# Patient Record
Sex: Female | Born: 1955
Health system: Southern US, Community
[De-identification: ages and names within clinical notes are randomized; demographics above are authoritative.]

## PROBLEM LIST (undated history)

## (undated) DIAGNOSIS — F419 Anxiety disorder, unspecified: Secondary | ICD-10-CM

## (undated) DIAGNOSIS — E785 Hyperlipidemia, unspecified: Secondary | ICD-10-CM

## (undated) DIAGNOSIS — K219 Gastro-esophageal reflux disease without esophagitis: Secondary | ICD-10-CM

## (undated) DIAGNOSIS — M47816 Spondylosis without myelopathy or radiculopathy, lumbar region: Secondary | ICD-10-CM

## (undated) DIAGNOSIS — H269 Unspecified cataract: Secondary | ICD-10-CM

## (undated) DIAGNOSIS — I2510 Atherosclerotic heart disease of native coronary artery without angina pectoris: Secondary | ICD-10-CM

## (undated) DIAGNOSIS — F329 Major depressive disorder, single episode, unspecified: Secondary | ICD-10-CM

## (undated) DIAGNOSIS — F32A Depression, unspecified: Secondary | ICD-10-CM

## (undated) DIAGNOSIS — I10 Essential (primary) hypertension: Secondary | ICD-10-CM

## (undated) DIAGNOSIS — M549 Dorsalgia, unspecified: Secondary | ICD-10-CM

## (undated) HISTORY — PX: MULTIPLE TOOTH EXTRACTIONS: SHX2053

## (undated) HISTORY — PX: BACK SURGERY: SHX140

## (undated) HISTORY — DX: Unspecified cataract: H26.9

## (undated) HISTORY — PX: COLONOSCOPY: SHX174

## (undated) HISTORY — DX: Depression, unspecified: F32.A

## (undated) HISTORY — DX: Spondylosis without myelopathy or radiculopathy, lumbar region: M47.816

## (undated) HISTORY — DX: Gastro-esophageal reflux disease without esophagitis: K21.9

## (undated) HISTORY — DX: Anxiety disorder, unspecified: F41.9

## (undated) HISTORY — DX: Hyperlipidemia, unspecified: E78.5

## (undated) HISTORY — DX: Major depressive disorder, single episode, unspecified: F32.9

---

## 1988-06-30 HISTORY — PX: SPINE SURGERY: SHX786

## 2012-12-09 ENCOUNTER — Emergency Department (HOSPITAL_COMMUNITY)
Admission: EM | Admit: 2012-12-09 | Discharge: 2012-12-10 | Disposition: A | Discharge status: Home / Self Care | Payer: Self-pay | Attending: Emergency Medicine | Admitting: Emergency Medicine

## 2012-12-09 ENCOUNTER — Encounter (HOSPITAL_COMMUNITY): Payer: Self-pay | Admitting: *Deleted

## 2012-12-09 DIAGNOSIS — Z9889 Other specified postprocedural states: Secondary | ICD-10-CM | POA: Insufficient documentation

## 2012-12-09 DIAGNOSIS — M5380 Other specified dorsopathies, site unspecified: Secondary | ICD-10-CM | POA: Insufficient documentation

## 2012-12-09 DIAGNOSIS — M62830 Muscle spasm of back: Secondary | ICD-10-CM

## 2012-12-09 DIAGNOSIS — M545 Low back pain, unspecified: Secondary | ICD-10-CM | POA: Insufficient documentation

## 2012-12-09 DIAGNOSIS — I10 Essential (primary) hypertension: Secondary | ICD-10-CM | POA: Insufficient documentation

## 2012-12-09 DIAGNOSIS — Z87891 Personal history of nicotine dependence: Secondary | ICD-10-CM | POA: Insufficient documentation

## 2012-12-09 HISTORY — DX: Essential (primary) hypertension: I10

## 2012-12-09 HISTORY — DX: Dorsalgia, unspecified: M54.9

## 2012-12-09 NOTE — ED Notes (Signed)
Pt with lower back pain x 1 month but worse over last few days, old injury

## 2012-12-10 MED ORDER — HYDROMORPHONE HCL PF 1 MG/ML IJ SOLN
1.0000 mg | Freq: Once | INTRAMUSCULAR | Status: AC
  Administered 2012-12-10: 1 mg via INTRAMUSCULAR
  Filled 2012-12-10: qty 1

## 2012-12-10 MED ORDER — HYDROCODONE-ACETAMINOPHEN 5-325 MG PO TABS: 1.0000 | ORAL_TABLET | ORAL | Status: DC | PRN

## 2012-12-10 MED ORDER — DIAZEPAM 5 MG PO TABS: 5.0000 mg | ORAL_TABLET | Freq: Two times a day (BID) | ORAL | Status: DC

## 2012-12-10 MED ORDER — DIAZEPAM 5 MG/ML IJ SOLN
10.0000 mg | Freq: Once | INTRAMUSCULAR | Status: AC
  Administered 2012-12-10: 10 mg via INTRAMUSCULAR
  Filled 2012-12-10: qty 2

## 2012-12-10 NOTE — ED Notes (Signed)
Patient states no relief from Valium; patient given Dilaudid 1mg  IM.

## 2012-12-10 NOTE — ED Provider Notes (Signed)
History     CSN: [id number]  Arrival date & time 12/09/12  2305   First MD Initiated Contact with Patient 12/09/12 2343      Chief Complaint  Patient presents with  . Back Pain    (Consider location/radiation/quality/duration/timing/severity/associated sxs/prior treatment) HPI Comments: Donna Woodward is a 57 y.o. Female with a chronic intermittent problem with low back pain and muscle spasm which has been more frequent over the past month.   Patient denies any new injury specifically. She describes right lower back muscle spasm which is worsened with movement. There is no radiation of her pain.  There has been no weakness or numbness in the lower extremities and no urinary or bowel retention or incontinence.  Patient does not have a history of cancer or IVDU.  She generally takes flexeril which she was prescribed when living up [facility] for the muscle spasm but has not helped for this flare.        The history is provided by the patient.    Past Medical History  Diagnosis Date  . Back pain   . Hypertension     Past Surgical History  Procedure Laterality Date  . Back surgery      History reviewed. No pertinent family history.  History  Substance Use Topics  . Smoking status: Former Games developer  . Smokeless tobacco: Not on file  . Alcohol Use: No    OB History   Grav Para Term Preterm Abortions TAB SAB Ect Mult Living                  Review of Systems  Constitutional: Negative for fever and chills.  Respiratory: Negative for shortness of breath.   Cardiovascular: Negative for chest pain and leg swelling.  Gastrointestinal: Negative for abdominal pain, constipation and abdominal distention.  Genitourinary: Negative for dysuria, urgency, frequency, flank pain and difficulty urinating.  Musculoskeletal: Positive for back pain. Negative for joint swelling and gait problem.  Skin: Negative for rash.  Neurological: Negative for weakness and numbness.    Allergies    Review of patient's allergies indicates no known allergies.  Home Medications   Current Outpatient Rx  Name  Route  Sig  Dispense  Refill  . diazepam (VALIUM) 5 MG tablet   Oral   Take 1 tablet (5 mg total) by mouth 2 (two) times daily.   15 tablet   0   . HYDROcodone-acetaminophen (NORCO/VICODIN) 5-325 MG per tablet   Oral   Take 1 tablet by mouth every 4 (four) hours as needed for pain.   15 tablet   0     BP 133/74  Pulse 60  Temp(Src) 97.8 F (36.6 C) (Oral)  Resp 18  Ht 5\' 5"  (1.651 m)  Wt 180 lb (81.647 kg)  BMI 29.95 kg/m2  SpO2 95%  Physical Exam  Nursing note and vitals reviewed. Constitutional: She appears well-developed and well-nourished.  HENT:  Head: Normocephalic.  Eyes: Conjunctivae are normal.  Neck: Normal range of motion. Neck supple.  Cardiovascular: Normal rate and intact distal pulses.   Pedal pulses normal.  Pulmonary/Chest: Effort normal.  Abdominal: Soft. Bowel sounds are normal. She exhibits no distension and no mass.  Musculoskeletal: Normal range of motion. She exhibits no edema.       Lumbar back: She exhibits tenderness and spasm. She exhibits no swelling and no edema.       Back:  Neurological: She is alert. She has normal strength. She displays no atrophy and no  tremor. No sensory deficit. Gait normal.  Reflex Scores:      Patellar reflexes are 2+ on the right side and 2+ on the left side.      Achilles reflexes are 2+ on the right side and 2+ on the left side. No strength deficit noted in hip and knee flexor and extensor muscle groups.  Ankle flexion and extension intact.  Skin: Skin is warm and dry.  Psychiatric: She has a normal mood and affect.    ED Course  Procedures (including critical care time)  Labs Reviewed - No data to display No results found.   1. Low back pain   2. Muscle spasm of back     Medications  diazepam (VALIUM) injection 10 mg (10 mg Intramuscular Given 12/10/12 0018)  HYDROmorphone (DILAUDID)  injection 1 mg (1 mg Intramuscular Given 12/10/12 0117)     MDM  Pt obtained moderate improvement in pain and muscle spasm with ed tx.  She was prescribed valium to use in place of her flexeril, hydrocodone,  Encouraged heat tx,  Continued ibuprofen,  Referrals given for pcp.  No neuro deficit on exam or by history to suggest emergent or surgical presentation.  Also discussed worsened sx that should prompt immediate re-evaluation including distal weakness, bowel/bladder retention/incontinence.               Burgess Amor, PA-C 12/10/12 0206

## 2012-12-10 NOTE — ED Provider Notes (Signed)
Medical screening examination/treatment/procedure(s) were performed by non-physician practitioner and as supervising physician I was immediately available for consultation/collaboration.  Nicoletta Dress. Colon Branch, MD 12/10/12 [phone]

## 2012-12-10 NOTE — ED Notes (Signed)
Discharge instructions given and reviewed with patient.  Prescriptions given for Vicodin and Valium; effects and use explained.  Patient verbalized understanding to take Valium in place of Flexeril and to follow up with PMD if no improvement.  Patient discharged home in good condition via wheelchair.  Friend accompanied discharge to drive home.

## 2013-02-17 ENCOUNTER — Ambulatory Visit (HOSPITAL_COMMUNITY)
Admission: RE | Admit: 2013-02-17 | Discharge: 2013-02-17 | Disposition: A | Discharge status: Home / Self Care | Payer: Self-pay | Source: Ambulatory Visit | Attending: Physical Medicine and Rehabilitation | Admitting: Physical Medicine and Rehabilitation

## 2013-02-17 ENCOUNTER — Other Ambulatory Visit (HOSPITAL_COMMUNITY): Payer: Self-pay | Admitting: *Deleted

## 2013-02-17 DIAGNOSIS — M545 Low back pain, unspecified: Secondary | ICD-10-CM | POA: Insufficient documentation

## 2013-02-17 DIAGNOSIS — S3992XA Unspecified injury of lower back, initial encounter: Secondary | ICD-10-CM

## 2013-04-26 ENCOUNTER — Other Ambulatory Visit (HOSPITAL_COMMUNITY): Payer: Self-pay | Admitting: *Deleted

## 2013-04-26 DIAGNOSIS — Z139 Encounter for screening, unspecified: Secondary | ICD-10-CM

## 2013-05-06 ENCOUNTER — Ambulatory Visit (HOSPITAL_COMMUNITY)
Admission: RE | Admit: 2013-05-06 | Discharge: 2013-05-06 | Disposition: A | Discharge status: Home / Self Care | Payer: PRIVATE HEALTH INSURANCE | Source: Ambulatory Visit | Attending: *Deleted | Admitting: *Deleted

## 2013-05-06 DIAGNOSIS — Z139 Encounter for screening, unspecified: Secondary | ICD-10-CM

## 2013-05-06 DIAGNOSIS — Z1231 Encounter for screening mammogram for malignant neoplasm of breast: Secondary | ICD-10-CM | POA: Insufficient documentation

## 2015-01-16 ENCOUNTER — Other Ambulatory Visit (HOSPITAL_COMMUNITY): Payer: Self-pay | Admitting: Physician Assistant

## 2015-01-16 DIAGNOSIS — Z1231 Encounter for screening mammogram for malignant neoplasm of breast: Secondary | ICD-10-CM

## 2015-01-25 ENCOUNTER — Ambulatory Visit (HOSPITAL_COMMUNITY)
Admission: RE | Admit: 2015-01-25 | Discharge: 2015-01-25 | Disposition: A | Discharge status: Home / Self Care | Payer: Self-pay | Source: Ambulatory Visit | Attending: Physician Assistant | Admitting: Physician Assistant

## 2015-01-25 DIAGNOSIS — Z1231 Encounter for screening mammogram for malignant neoplasm of breast: Secondary | ICD-10-CM | POA: Insufficient documentation

## 2015-04-05 ENCOUNTER — Other Ambulatory Visit: Payer: Self-pay | Admitting: Physician Assistant

## 2015-04-05 LAB — COMPREHENSIVE METABOLIC PANEL
ALBUMIN: 4 g/dL (ref 3.6–5.1)
ALT: 20 U/L (ref 6–29)
AST: 17 U/L (ref 10–35)
Alkaline Phosphatase: 60 U/L (ref 33–130)
BUN: 15 mg/dL (ref 7–25)
CHLORIDE: 104 mmol/L (ref 98–110)
CO2: 28 mmol/L (ref 20–31)
CREATININE: 0.59 mg/dL (ref 0.50–1.05)
Calcium: 8.9 mg/dL (ref 8.6–10.4)
Glucose, Bld: 119 mg/dL — ABNORMAL HIGH (ref 65–99)
Potassium: 3.7 mmol/L (ref 3.5–5.3)
SODIUM: 142 mmol/L (ref 135–146)
Total Bilirubin: 0.7 mg/dL (ref 0.2–1.2)
Total Protein: 6.3 g/dL (ref 6.1–8.1)

## 2015-04-05 LAB — LIPID PANEL
Cholesterol: 196 mg/dL (ref 125–200)
HDL: 31 mg/dL — AB (ref >=46)
LDL CALC: 133 mg/dL — AB (ref <130)
TRIGLYCERIDES: 158 mg/dL — AB (ref <150)
Total CHOL/HDL Ratio: 6.3 Ratio — ABNORMAL HIGH (ref <=5.0)
VLDL: 32 mg/dL — AB (ref <30)

## 2015-04-09 ENCOUNTER — Ambulatory Visit: Payer: Self-pay | Admitting: Physician Assistant

## 2015-04-09 ENCOUNTER — Encounter: Payer: Self-pay | Admitting: Physician Assistant

## 2015-04-09 VITALS — BP 142/88 | HR 72 | Temp 97.0°F | Ht 64.0 in | Wt 186.5 lb

## 2015-04-09 DIAGNOSIS — I10 Essential (primary) hypertension: Secondary | ICD-10-CM

## 2015-04-09 DIAGNOSIS — E785 Hyperlipidemia, unspecified: Secondary | ICD-10-CM

## 2015-04-09 MED ORDER — SIMVASTATIN 20 MG PO TABS
20.0000 mg | ORAL_TABLET | Freq: Every day | ORAL | Status: DC
Start: 2015-04-09 — End: 2016-03-24

## 2015-04-09 NOTE — Progress Notes (Signed)
BP 139/92 mmHg  Pulse 72  Temp(Src) 97 F (36.1 C)  Ht 5\' 4"  (1.626 m)  Wt 186 lb 8 oz (84.596 kg)  BMI 32.00 kg/m2  SpO2 98%   Subjective:    Patient ID: Donna Woodward, female    DOB: 1955/08/11, 59 y.o.   MRN: [id number]  HPI: Donna Woodward is a 59 y.o. female presenting on 04/09/2015 for Hypertension   HPI   Pt supposed to be taking simvastatin but she ran out and so she started taking her old lovastatin  Relevant past medical, surgical, family and social history reviewed and updated as indicated. Interim medical history since our last visit reviewed. Allergies and medications reviewed and updated.  Review of Systems  Constitutional: Negative for fever, chills, diaphoresis, appetite change, fatigue and unexpected weight change.  HENT: Negative for congestion, drooling, ear pain, facial swelling, hearing loss, mouth sores, sneezing, sore throat, trouble swallowing and voice change.   Eyes: Negative for pain, discharge, redness, itching and visual disturbance.  Respiratory: Negative for cough, choking, shortness of breath and wheezing.   Cardiovascular: Negative for chest pain, palpitations and leg swelling.  Gastrointestinal: Negative for vomiting, abdominal pain, diarrhea, constipation and blood in stool.  Endocrine: Negative for cold intolerance, heat intolerance and polydipsia.  Genitourinary: Negative for dysuria, hematuria and decreased urine volume.  Musculoskeletal: Positive for back pain, arthralgias and gait problem.  Skin: Negative for rash.  Allergic/Immunologic: Negative for environmental allergies.  Neurological: Negative for seizures, syncope, light-headedness and headaches.  Hematological: Negative for adenopathy.  Psychiatric/Behavioral: Positive for dysphoric mood. Negative for suicidal ideas and agitation. The patient is not nervous/anxious.     Per HPI unless specifically indicated above     Objective:    BP 139/92 mmHg  Pulse 72  Temp(Src) 97  F (36.1 C)  Ht 5\' 4"  (1.626 m)  Wt 186 lb 8 oz (84.596 kg)  BMI 32.00 kg/m2  SpO2 98%  Wt Readings from Last 3 Encounters:  04/09/15 186 lb 8 oz (84.596 kg)  12/09/12 180 lb (81.647 kg)    Physical Exam  Constitutional: She is oriented to person, place, and time. She appears well-developed and well-nourished.  HENT:  Head: Normocephalic and atraumatic.  Neck: Neck supple.  Cardiovascular: Normal rate and regular rhythm.   Pulmonary/Chest: Effort normal and breath sounds normal.  Abdominal: Soft. Bowel sounds are normal. She exhibits no mass. There is no tenderness.  Musculoskeletal: She exhibits no edema.  Lymphadenopathy:    She has no cervical adenopathy.  Neurological: She is alert and oriented to person, place, and time.  Skin: Skin is warm and dry.  Psychiatric: She has a normal mood and affect. Her behavior is normal.  Vitals reviewed.     Results for orders placed or performed in visit on 04/05/15  Comprehensive metabolic panel  Result Value Ref Range   Sodium 142 135 - 146 mmol/L   Potassium 3.7 3.5 - 5.3 mmol/L   Chloride 104 98 - 110 mmol/L   CO2 28 20 - 31 mmol/L   Glucose, Bld 119 (H) 65 - 99 mg/dL   BUN 15 7 - 25 mg/dL   Creat 0.59 0.50 - 1.05 mg/dL   Total Bilirubin 0.7 0.2 - 1.2 mg/dL   Alkaline Phosphatase 60 33 - 130 U/L   AST 17 10 - 35 U/L   ALT 20 6 - 29 U/L   Total Protein 6.3 6.1 - 8.1 g/dL   Albumin 4.0 3.6 - 5.1 g/dL  Calcium 8.9 8.6 - 10.4 mg/dL  Lipid panel  Result Value Ref Range   Cholesterol 196 125 - 200 mg/dL   Triglycerides 158 (H) <150 mg/dL   HDL 31 (L) >=46 mg/dL   Total CHOL/HDL Ratio 6.3 (H) <=5.0 Ratio   VLDL 32 (H) <30 mg/dL   LDL Cholesterol 133 (H) <130 mg/dL      Assessment & Plan:   Encounter Diagnoses  Name Primary?  . Essential hypertension, benign Yes  . Hyperlipemia     -reviewed labs with pt -Get back on simvastatin. watch lowfat diet -F/u 1 mo for recheck bp

## 2015-04-29 DIAGNOSIS — E785 Hyperlipidemia, unspecified: Secondary | ICD-10-CM | POA: Insufficient documentation

## 2015-04-29 DIAGNOSIS — I10 Essential (primary) hypertension: Secondary | ICD-10-CM | POA: Insufficient documentation

## 2015-05-14 ENCOUNTER — Ambulatory Visit: Payer: Self-pay | Admitting: Physician Assistant

## 2015-08-08 ENCOUNTER — Other Ambulatory Visit: Payer: Self-pay | Admitting: Physician Assistant

## 2015-08-14 ENCOUNTER — Ambulatory Visit: Payer: Self-pay | Admitting: Physician Assistant

## 2015-08-14 ENCOUNTER — Encounter: Payer: Self-pay | Admitting: Physician Assistant

## 2015-08-14 VITALS — BP 118/70 | HR 60 | Temp 97.5°F | Ht 64.0 in | Wt 198.3 lb

## 2015-08-14 DIAGNOSIS — E785 Hyperlipidemia, unspecified: Secondary | ICD-10-CM

## 2015-08-14 DIAGNOSIS — E669 Obesity, unspecified: Secondary | ICD-10-CM | POA: Insufficient documentation

## 2015-08-14 DIAGNOSIS — I10 Essential (primary) hypertension: Secondary | ICD-10-CM

## 2015-08-14 NOTE — Progress Notes (Signed)
BP 118/70 mmHg  Pulse 60  Temp(Src) 97.5 F (36.4 C)  Ht 5\' 4"  (1.626 m)  Wt 198 lb 4.8 oz (89.948 kg)  BMI 34.02 kg/m2  SpO2 97%   Subjective:    Patient ID: Donna Woodward, female    DOB: 23-Nov-1955, 60 y.o.   MRN: [id number]  HPI: Donna Woodward is a 60 y.o. female presenting on 08/14/2015 for No chief complaint on file.   HPI   Pt past due for OV.  She is still taking lovastatin- says she may have 2 weeks of it left.  She has simvastatin ready to start when it's done.  She is working with a Licensed conveyancer.   Says she is doing well overall with no new complaints  Relevant past medical, surgical, family and social history reviewed and updated as indicated. Interim medical history since our last visit reviewed. Allergies and medications reviewed and updated.   Current outpatient prescriptions:  .  amLODipine (NORVASC) 10 MG tablet, Take 10 mg by mouth daily., Disp: , Rfl:  .  atenolol (TENORMIN) 100 MG tablet, TAKE ONE TABLET BY MOUTH ONCE DAILY, Disp: 14 tablet, Rfl: 0 .  cyclobenzaprine (FLEXERIL) 10 MG tablet, Take 10 mg by mouth once as needed for muscle spasms., Disp: , Rfl:  .  lisinopril-hydrochlorothiazide (PRINZIDE,ZESTORETIC) 20-25 MG tablet, Take 1 tablet by mouth daily., Disp: , Rfl:  .  lovastatin (MEVACOR) 20 MG tablet, Take 20 mg by mouth at bedtime., Disp: , Rfl:  .  omega-3 fish oil (MAXEPA) 1000 MG CAPS capsule, Take 1 capsule by mouth 2 (two) times daily., Disp: , Rfl:  .  simvastatin (ZOCOR) 20 MG tablet, Take 1 tablet (20 mg total) by mouth at bedtime. (Patient not taking: Reported on 08/14/2015), Disp: 90 tablet, Rfl: 1   Review of Systems  Constitutional: Negative for fever, chills, diaphoresis, appetite change, fatigue and unexpected weight change.  HENT: Negative for congestion, dental problem, drooling, ear pain, facial swelling, hearing loss, mouth sores, sneezing, sore throat, trouble swallowing and voice change.   Eyes: Negative for pain,  discharge, redness, itching and visual disturbance.  Respiratory: Negative for cough, choking, shortness of breath and wheezing.   Cardiovascular: Negative for chest pain, palpitations and leg swelling.  Gastrointestinal: Negative for vomiting, abdominal pain, diarrhea, constipation and blood in stool.  Endocrine: Negative for cold intolerance, heat intolerance and polydipsia.  Genitourinary: Negative for dysuria, hematuria and decreased urine volume.  Musculoskeletal: Positive for back pain and gait problem. Negative for arthralgias.  Skin: Negative for rash.  Allergic/Immunologic: Negative for environmental allergies.  Neurological: Negative for seizures, syncope, light-headedness and headaches.  Hematological: Negative for adenopathy.  Psychiatric/Behavioral: Positive for dysphoric mood. Negative for suicidal ideas and agitation. The patient is not nervous/anxious.     Per HPI unless specifically indicated above     Objective:    BP 118/70 mmHg  Pulse 60  Temp(Src) 97.5 F (36.4 C)  Ht 5\' 4"  (1.626 m)  Wt 198 lb 4.8 oz (89.948 kg)  BMI 34.02 kg/m2  SpO2 97%  Wt Readings from Last 3 Encounters:  08/14/15 198 lb 4.8 oz (89.948 kg)  04/09/15 186 lb 8 oz (84.596 kg)  12/09/12 180 lb (81.647 kg)    Physical Exam  Constitutional: She is oriented to person, place, and time. She appears well-developed and well-nourished.  HENT:  Head: Normocephalic and atraumatic.  Neck: Neck supple.  Cardiovascular: Normal rate and regular rhythm.   Pulmonary/Chest: Effort normal and breath sounds normal.  Abdominal: Soft. Bowel sounds are normal. She exhibits no mass. There is no hepatosplenomegaly. There is no tenderness.  Musculoskeletal: She exhibits no edema.  Lymphadenopathy:    She has no cervical adenopathy.  Neurological: She is alert and oriented to person, place, and time.  Skin: Skin is warm and dry.  Psychiatric: She has a normal mood and affect. Her behavior is normal.  Vitals  reviewed.       Assessment & Plan:   Encounter Diagnoses  Name Primary?  . Essential hypertension, benign Yes  . Hyperlipidemia   . Obesity, unspecified    -continue current meds with stop lovastatin when finished and start simvastatin -recommend pt attend free screening for PAP at [facility] on 08/27/15 -pt to get Get fasting labs. Will call results -mammo not due until 2018 -Pt states she had colonoscopy in [place] at age 35 -F/u 3 months.  RTO sooner prn

## 2015-08-23 LAB — LIPID PANEL
CHOL/HDL RATIO: 5.8 ratio — AB (ref <=5.0)
CHOLESTEROL: 197 mg/dL (ref 125–200)
HDL: 34 mg/dL — ABNORMAL LOW (ref >=46)
LDL Cholesterol: 111 mg/dL (ref <130)
Triglycerides: 261 mg/dL — ABNORMAL HIGH (ref <150)
VLDL: 52 mg/dL — ABNORMAL HIGH (ref <30)

## 2015-08-23 LAB — COMPLETE METABOLIC PANEL WITH GFR
ALBUMIN: 3.9 g/dL (ref 3.6–5.1)
ALK PHOS: 70 U/L (ref 33–130)
ALT: 35 U/L — ABNORMAL HIGH (ref 6–29)
AST: 23 U/L (ref 10–35)
BUN: 13 mg/dL (ref 7–25)
CALCIUM: 9.2 mg/dL (ref 8.6–10.4)
CHLORIDE: 99 mmol/L (ref 98–110)
CO2: 30 mmol/L (ref 20–31)
CREATININE: 0.77 mg/dL (ref 0.50–1.05)
GFR, Est Non African American: 85 mL/min (ref >=60)
Glucose, Bld: 128 mg/dL — ABNORMAL HIGH (ref 65–99)
POTASSIUM: 4.1 mmol/L (ref 3.5–5.3)
Sodium: 142 mmol/L (ref 135–146)
TOTAL PROTEIN: 6.5 g/dL (ref 6.1–8.1)
Total Bilirubin: 0.5 mg/dL (ref 0.2–1.2)

## 2015-08-30 ENCOUNTER — Other Ambulatory Visit: Payer: Self-pay | Admitting: Physician Assistant

## 2015-10-15 ENCOUNTER — Other Ambulatory Visit: Payer: Self-pay | Admitting: Physician Assistant

## 2015-11-06 ENCOUNTER — Other Ambulatory Visit: Payer: Self-pay

## 2015-11-06 DIAGNOSIS — I10 Essential (primary) hypertension: Secondary | ICD-10-CM

## 2015-11-06 DIAGNOSIS — E785 Hyperlipidemia, unspecified: Secondary | ICD-10-CM

## 2015-11-08 LAB — COMPLETE METABOLIC PANEL WITH GFR
ALK PHOS: 59 U/L (ref 33–130)
ALT: 15 U/L (ref 6–29)
AST: 12 U/L (ref 10–35)
Albumin: 4.2 g/dL (ref 3.6–5.1)
BUN: 14 mg/dL (ref 7–25)
CALCIUM: 9.1 mg/dL (ref 8.6–10.4)
CHLORIDE: 100 mmol/L (ref 98–110)
CO2: 31 mmol/L (ref 20–31)
Creat: 0.71 mg/dL (ref 0.50–1.05)
GFR, Est African American: >89 mL/min (ref >=60)
Glucose, Bld: 109 mg/dL — ABNORMAL HIGH (ref 65–99)
POTASSIUM: 3.6 mmol/L (ref 3.5–5.3)
Sodium: 140 mmol/L (ref 135–146)
Total Bilirubin: 0.7 mg/dL (ref 0.2–1.2)
Total Protein: 6.5 g/dL (ref 6.1–8.1)

## 2015-11-08 LAB — LIPID PANEL
CHOL/HDL RATIO: 5.2 ratio — AB (ref <=5.0)
CHOLESTEROL: 171 mg/dL (ref 125–200)
HDL: 33 mg/dL — AB (ref >=46)
LDL CALC: 112 mg/dL (ref <130)
TRIGLYCERIDES: 129 mg/dL (ref <150)
VLDL: 26 mg/dL (ref <30)

## 2015-11-14 ENCOUNTER — Encounter: Payer: Self-pay | Admitting: Physician Assistant

## 2015-11-14 ENCOUNTER — Ambulatory Visit: Payer: Self-pay | Admitting: Physician Assistant

## 2015-11-14 VITALS — BP 96/60 | HR 69 | Temp 97.3°F | Ht 64.0 in | Wt 190.6 lb

## 2015-11-14 DIAGNOSIS — E785 Hyperlipidemia, unspecified: Secondary | ICD-10-CM

## 2015-11-14 DIAGNOSIS — I10 Essential (primary) hypertension: Secondary | ICD-10-CM

## 2015-11-14 DIAGNOSIS — E669 Obesity, unspecified: Secondary | ICD-10-CM

## 2015-11-14 NOTE — Progress Notes (Signed)
BP 96/60 mmHg  Pulse 69  Temp(Src) 97.3 F (36.3 C)  Ht 5\' 4"  (1.626 m)  Wt 190 lb 9.6 oz (86.456 kg)  BMI 32.70 kg/m2  SpO2 96%   Subjective:    Patient ID: Donna Woodward, female    DOB: [birth year], 60 y.o.   MRN: [id number]  HPI: Donna Woodward is a 60 y.o. female presenting on 11/14/2015 for Hypertension and Hyperlipidemia   HPI  Pt is still job-seeking  She says she is doing well  She [facility]'t [facility]  Pt has not been taking her fish oil  Relevant past medical, surgical, family and social history reviewed and updated as indicated. Interim medical history since our last visit reviewed. Allergies and medications reviewed and updated.   Current outpatient prescriptions:  .  amLODipine (NORVASC) 10 MG tablet, Take 10 mg by mouth daily., Disp: , Rfl:  .  atenolol (TENORMIN) 100 MG tablet, TAKE ONE TABLET BY MOUTH ONCE DAILY, Disp: 30 tablet, Rfl: 4 .  lisinopril-hydrochlorothiazide (PRINZIDE,ZESTORETIC) 20-25 MG tablet, TAKE ONE TABLET BY MOUTH ONCE DAILY, Disp: 90 tablet, Rfl: 0 .  simvastatin (ZOCOR) 20 MG tablet, Take 1 tablet (20 mg total) by mouth at bedtime., Disp: 90 tablet, Rfl: 1 .  omega-3 fish oil (MAXEPA) 1000 MG CAPS capsule, Take 1 capsule by mouth 2 (two) times daily. Reported on 11/14/2015, Disp: , Rfl:    Review of Systems  Constitutional: Negative for fever, chills, diaphoresis, appetite change, fatigue and unexpected weight change.  HENT: Negative for congestion, dental problem, drooling, ear pain, facial swelling, hearing loss, mouth sores, sneezing, sore throat, trouble swallowing and voice change.   Eyes: Negative for pain, discharge, redness, itching and visual disturbance.  Respiratory: Negative for cough, choking, shortness of breath and wheezing.   Cardiovascular: Negative for chest pain, palpitations and leg swelling.  Gastrointestinal: Negative for vomiting, abdominal pain, diarrhea, constipation and blood in stool.   Endocrine: Negative for cold intolerance, heat intolerance and polydipsia.  Genitourinary: Negative for dysuria, hematuria and decreased urine volume.  Musculoskeletal: Negative for back pain, arthralgias and gait problem.  Skin: Negative for rash.  Allergic/Immunologic: Negative for environmental allergies.  Neurological: Negative for seizures, syncope, light-headedness and headaches.  Hematological: Negative for adenopathy.  Psychiatric/Behavioral: Positive for dysphoric mood. Negative for suicidal ideas and agitation. The patient is not nervous/anxious.     Per HPI unless specifically indicated above     Objective:    BP 96/60 mmHg  Pulse 69  Temp(Src) 97.3 F (36.3 C)  Ht 5\' 4"  (1.626 m)  Wt 190 lb 9.6 oz (86.456 kg)  BMI 32.70 kg/m2  SpO2 96%  Wt Readings from Last 3 Encounters:  11/14/15 190 lb 9.6 oz (86.456 kg)  08/14/15 198 lb 4.8 oz (89.948 kg)  04/09/15 186 lb 8 oz (84.596 kg)    Physical Exam  Constitutional: She is oriented to person, place, and time. She appears well-developed and well-nourished.  HENT:  Head: Normocephalic and atraumatic.  Neck: Neck supple.  Cardiovascular: Normal rate and regular rhythm.   Pulmonary/Chest: Effort normal and breath sounds normal.  Abdominal: Soft. Bowel sounds are normal. She exhibits no mass. There is no hepatosplenomegaly. There is no tenderness.  Musculoskeletal: She exhibits no edema.  Lymphadenopathy:    She has no cervical adenopathy.  Neurological: She is alert and oriented to person, place, and time.  Skin: Skin is warm and dry.  Psychiatric: She has a normal mood and affect. Her behavior  is normal.  Vitals reviewed.   Results for orders placed or performed in visit on 11/06/15  COMPLETE METABOLIC PANEL WITH GFR  Result Value Ref Range   Sodium 140 135 - 146 mmol/L   Potassium 3.6 3.5 - 5.3 mmol/L   Chloride 100 98 - 110 mmol/L   CO2 31 20 - 31 mmol/L   Glucose, Bld 109 (H) 65 - 99 mg/dL   BUN 14 7 - 25  mg/dL   Creat 0.71 0.50 - 1.05 mg/dL   Total Bilirubin 0.7 0.2 - 1.2 mg/dL   Alkaline Phosphatase 59 33 - 130 U/L   AST 12 10 - 35 U/L   ALT 15 6 - 29 U/L   Total Protein 6.5 6.1 - 8.1 g/dL   Albumin 4.2 3.6 - 5.1 g/dL   Calcium 9.1 8.6 - 10.4 mg/dL   GFR, Est African American >89 >=60 mL/min   GFR, Est Non African American >89 >=60 mL/min  Lipid Profile  Result Value Ref Range   Cholesterol 171 125 - 200 mg/dL   Triglycerides 129 <150 mg/dL   HDL 33 (L) >=46 mg/dL   Total CHOL/HDL Ratio 5.2 (H) <=5.0 Ratio   VLDL 26 <30 mg/dL   LDL Cholesterol 112 <130 mg/dL      Assessment & Plan:   Encounter Diagnoses  Name Primary?  . Essential hypertension, benign Yes  . Hyperlipidemia   . Obesity, unspecified     -reviewed labs with pt -Pt declines to get pap done at next appt -Pt counseled to get back on fish oil -no change in bp meds today as it was good at last OV and borderline high at appt before that -F/u 3 months for lipids.

## 2016-01-27 ENCOUNTER — Other Ambulatory Visit: Payer: Self-pay | Admitting: Physician Assistant

## 2016-01-28 ENCOUNTER — Other Ambulatory Visit: Payer: Self-pay | Admitting: Physician Assistant

## 2016-01-28 MED ORDER — AMLODIPINE BESYLATE 10 MG PO TABS: 10.0000 mg | ORAL_TABLET | Freq: Every day | ORAL | 0 refills | Status: DC

## 2016-02-12 ENCOUNTER — Ambulatory Visit: Payer: Self-pay | Admitting: Physician Assistant

## 2016-02-13 ENCOUNTER — Ambulatory Visit: Payer: Self-pay | Admitting: Physician Assistant

## 2016-03-24 ENCOUNTER — Encounter: Payer: Self-pay | Admitting: Family Medicine

## 2016-03-24 ENCOUNTER — Ambulatory Visit (INDEPENDENT_AMBULATORY_CARE_PROVIDER_SITE_OTHER): Payer: Medicare Other | Admitting: Family Medicine

## 2016-03-24 VITALS — BP 130/80 | HR 72 | Resp 16 | Ht 64.0 in | Wt 189.0 lb

## 2016-03-24 DIAGNOSIS — Z7189 Other specified counseling: Secondary | ICD-10-CM

## 2016-03-24 DIAGNOSIS — Z7689 Persons encountering health services in other specified circumstances: Secondary | ICD-10-CM

## 2016-03-24 DIAGNOSIS — E785 Hyperlipidemia, unspecified: Secondary | ICD-10-CM | POA: Diagnosis not present

## 2016-03-24 DIAGNOSIS — I10 Essential (primary) hypertension: Secondary | ICD-10-CM

## 2016-03-24 DIAGNOSIS — M47816 Spondylosis without myelopathy or radiculopathy, lumbar region: Secondary | ICD-10-CM

## 2016-03-24 DIAGNOSIS — F32A Depression, unspecified: Secondary | ICD-10-CM | POA: Insufficient documentation

## 2016-03-24 DIAGNOSIS — M25511 Pain in right shoulder: Secondary | ICD-10-CM

## 2016-03-24 DIAGNOSIS — F329 Major depressive disorder, single episode, unspecified: Secondary | ICD-10-CM

## 2016-03-24 HISTORY — DX: Spondylosis without myelopathy or radiculopathy, lumbar region: M47.816

## 2016-03-24 MED ORDER — LISINOPRIL-HYDROCHLOROTHIAZIDE 20-25 MG PO TABS: 1.0000 | ORAL_TABLET | Freq: Every day | ORAL | 1 refills | Status: DC

## 2016-03-24 MED ORDER — METOPROLOL SUCCINATE ER 50 MG PO TB24: 50.0000 mg | ORAL_TABLET | Freq: Every day | ORAL | 1 refills | Status: DC

## 2016-03-24 MED ORDER — OMEGA-3 FISH OIL 1000 MG PO CAPS: 1.0000 | ORAL_CAPSULE | Freq: Two times a day (BID) | ORAL | 1 refills | Status: DC

## 2016-03-24 MED ORDER — AMLODIPINE BESYLATE 10 MG PO TABS: 10.0000 mg | ORAL_TABLET | Freq: Every day | ORAL | 1 refills | Status: DC

## 2016-03-24 MED ORDER — SIMVASTATIN 20 MG PO TABS: 20.0000 mg | ORAL_TABLET | Freq: Every day | ORAL | 1 refills | Status: DC

## 2016-03-24 NOTE — Patient Instructions (Signed)
Get release for old colonoscopy  Refer to PT for back pain  Take the ibuprofen 3 times a day with food  Refilled the BP meds  See me in a month for follow up

## 2016-03-24 NOTE — Progress Notes (Signed)
Chief Complaint  Patient presents with  . Establish Care    Noticed a lump on her right shoulder and its painful and it radiates towards her neck     New patient to establish. Well-controlled hypertension on multiple medications. Hyperlipidemia managed with simvastatin. Patient was previously a cigarette smoker and quit in 2008. She is disabled from working because of chronic low back pain. Status post back surgery. 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 She has had a mammogram within 2 years. She refuses a Pap and does not feel like she needs one. She states she has had a colonoscopy but is uncertain to date. These old records will be requested. Immunizations are not up-to-date. She refuses immunizations today. Discussed diet and exercise. She is good with a low-cholesterol diet. She does not get exercise because for back pain. She agrees to a referral to physical therapy to learn back exercises and core strengthening.  Acute complaint today of right shoulder pain. She thinks her shoulder looks swollen. No activity. No fall or injury. At times either or both shoulders are painful. She has not had her shoulders x-rayed or treated. No radiation of pain into hands or arms.   Patient Active Problem List   Diagnosis Date Noted  . Degenerative joint disease (DJD) of lumbar spine 03/24/2016  . Depression (emotion) 03/24/2016  . Obesity, unspecified 08/14/2015  . Essential hypertension, benign 04/29/2015  . Hyperlipidemia 04/29/2015    Outpatient Encounter Prescriptions as of 03/24/2016  Medication Sig  . amLODipine (NORVASC) 10 MG tablet Take 1 tablet (10 mg total) by mouth daily.  Marland Kitchen lisinopril-hydrochlorothiazide (PRINZIDE,ZESTORETIC) 20-25 MG tablet Take 1 tablet by mouth daily.  . metoprolol succinate (TOPROL-XL) 50 MG 24 hr tablet Take 1 tablet (50 mg total) by mouth daily. Take  with or immediately following a meal.  . omega-3 fish oil (MAXEPA) 1000 MG CAPS capsule Take 1 capsule (1,000 mg total) by mouth 2 (two) times daily. Reported on 11/14/2015  . simvastatin (ZOCOR) 20 MG tablet Take 1 tablet (20 mg total) by mouth at bedtime.   No facility-administered encounter medications on file as of 03/24/2016.     Past Medical History:  Diagnosis Date  . Anxiety   . Back pain   . Degenerative joint disease (DJD) of lumbar spine 03/24/2016  . Depression   . GERD (gastroesophageal reflux disease)    if overweight  . Hyperlipidemia   . Hypertension     Past Surgical History:  Procedure Laterality Date  . BACK SURGERY    . [place]   lamnectomy L5S1    Social History   Social History  . Marital status: Single    Spouse name: N/A  . Number of children: N/A  . Years of education: 20   Occupational History  . disabled     warehouse   Social History Main Topics  . Smoking status: Former Smoker    Quit date: 06/30/2006  . Smokeless tobacco: Never Used  . Alcohol use No  . Drug use: No     Comment: hx marijuana  . Sexual activity: No   Other Topics Concern  . Not on file   Social History Narrative   Masters in behavioral health   Live alone   Disabled with back pain    Family History  Problem Relation Age of Onset  . Hypertension Mother   .  Stroke Father   . Heart disease Father   . Dementia Father   . Hypertension Sister   . Hypertension Sister   . Cancer Sister 63    rectal  . Hypertension Sister     Review of Systems  Constitutional: Negative for chills, fever and weight loss.  HENT: Negative for congestion and hearing loss.        Needs dental repair  Eyes: Negative for blurred vision and pain.  Respiratory: Negative for cough and shortness of breath.   Cardiovascular: Negative for chest pain and leg swelling.  Gastrointestinal: Negative for abdominal pain, constipation, diarrhea and heartburn.  Genitourinary: Negative  for dysuria and frequency.  Musculoskeletal: Positive for back pain and joint pain. Negative for falls and myalgias.  Neurological: Negative for dizziness, seizures and headaches.  Psychiatric/Behavioral: Positive for depression. The patient is not nervous/anxious and does not have insomnia.     BP 130/80   Pulse 72   Resp 16   Ht 5\' 4"  (1.626 m)   Wt 189 lb (85.7 kg)   SpO2 99%   BMI 32.44 kg/m   Physical Exam  Constitutional: She is oriented to person, place, and time. She appears well-developed and well-nourished.  HENT:  Head: Normocephalic and atraumatic.  Right Ear: External ear normal.  Left Ear: External ear normal.  Mouth/Throat: Oropharynx is clear and moist.  Absent teeth, periodontal disease  Eyes: Conjunctivae are normal. Pupils are equal, round, and reactive to light.  Neck: Normal range of motion. Neck supple. No thyromegaly present.  Cardiovascular: Normal rate, regular rhythm and normal heart sounds.   Pulmonary/Chest: Effort normal and breath sounds normal. No respiratory distress.  Abdominal: Soft. Bowel sounds are normal.  Musculoskeletal: Normal range of motion. She exhibits no edema.  Right ac joint is mildly swollen and acutely tender. Pain with shoulder range of motion.  Lymphadenopathy:    She has no cervical adenopathy.  Neurological: She is alert and oriented to person, place, and time.  Gait normal  Skin: Skin is warm and dry.  Psychiatric: Her behavior is normal. Thought content normal.  Emotional lability and tearfulness  Nursing note and vitals reviewed.  ASSESSMENT/PLAN:  1. Encounter to establish care with new doctor    2. Spondylosis of lumbar region without myelopathy or radiculopathy Referred to physical therapy for back exercises and strengthening, back pain management  3. Essential hypertension, benign  4. Hyperlipidemia  5. Depression (emotion)  6. Right shoulder pain Discussed NSAID over the counter and home  exercises   Patient Instructions  Get release for old colonoscopy  Refer to PT for back pain  Take the ibuprofen 3 times a day with food  Refilled the BP meds  See me in a month for follow up    Raylene Everts, MD

## 2016-04-07 ENCOUNTER — Encounter: Payer: Self-pay | Admitting: Family Medicine

## 2016-04-23 ENCOUNTER — Other Ambulatory Visit: Payer: Self-pay

## 2016-04-23 ENCOUNTER — Ambulatory Visit (INDEPENDENT_AMBULATORY_CARE_PROVIDER_SITE_OTHER): Payer: Medicare Other | Admitting: Family Medicine

## 2016-04-23 ENCOUNTER — Encounter: Payer: Self-pay | Admitting: Family Medicine

## 2016-04-23 VITALS — BP 120/80 | HR 76 | Temp 98.8°F | Resp 18 | Ht 64.0 in | Wt 192.0 lb

## 2016-04-23 DIAGNOSIS — H04302 Unspecified dacryocystitis of left lacrimal passage: Secondary | ICD-10-CM

## 2016-04-23 DIAGNOSIS — E785 Hyperlipidemia, unspecified: Secondary | ICD-10-CM | POA: Diagnosis not present

## 2016-04-23 DIAGNOSIS — R0789 Other chest pain: Secondary | ICD-10-CM | POA: Diagnosis not present

## 2016-04-23 DIAGNOSIS — I10 Essential (primary) hypertension: Secondary | ICD-10-CM

## 2016-04-23 DIAGNOSIS — M47816 Spondylosis without myelopathy or radiculopathy, lumbar region: Secondary | ICD-10-CM

## 2016-04-23 NOTE — Progress Notes (Signed)
ROS  Physical Exam     

## 2016-04-23 NOTE — Patient Instructions (Signed)
Continue current medicines  Referral to PT for your back pain  Referral to ophthalmology for the eye problem  Referral to cardiology for the chest pressure  Continue to eat well and stay active  See me in a couple of months  Due for fasting lab work for next visit

## 2016-04-23 NOTE — Progress Notes (Signed)
Chief Complaint  Patient presents with  . Follow-up    1 month   Patient is here for follow-up. She states she's feeling well. At her last visit we discussed that she needs to go to physical therapy for her chronic low back pain. Unfortunately that was not ordered. I will order physical therapy for her today. She is compliant with her blood pressure medication. She takes Norvasc and lisinopril hydrochlorothiazide and metoprolol. Her blood pressure is well controlled. She is on simvastatin for hyperlipidemia. No recent lipid testing available. I will order blood work Patient previously was a cigarette smoker. She does have cardiac risk factors. She states she had a stress test over 10 years ago. She states that it was negative. She also tells me that recently she's noted when she walks briskly she feels a pressure sensation in the center for chest. No shortness of breath, no lightheadedness or diaphoresis. This sensation goes away with rest. She rates the pain as "moderate". A EKG is done, and there are abnormalities. I will refer her to cardiology for workup of her chest pain, potentially angina. Patient tells me she's had trouble with her left eye tear duct for several months. It's not getting better. It feels irritated. I will refer her to ophthalmology,  vision is not impaired.   Patient Active Problem List   Diagnosis Date Noted  . Degenerative joint disease (DJD) of lumbar spine 03/24/2016  . Depression (emotion) 03/24/2016  . Obesity, unspecified 08/14/2015  . Essential hypertension, benign 04/29/2015  . Hyperlipidemia 04/29/2015    Outpatient Encounter Prescriptions as of 04/23/2016  Medication Sig  . amLODipine (NORVASC) 10 MG tablet Take 1 tablet (10 mg total) by mouth daily.  Marland Kitchen lisinopril-hydrochlorothiazide (PRINZIDE,ZESTORETIC) 20-25 MG tablet Take 1 tablet by mouth daily.  . metoprolol succinate (TOPROL-XL) 50 MG 24 hr tablet Take 1 tablet (50 mg total) by mouth daily.  Take with or immediately following a meal.  . omega-3 fish oil (MAXEPA) 1000 MG CAPS capsule Take 1 capsule (1,000 mg total) by mouth 2 (two) times daily. Reported on 11/14/2015  . simvastatin (ZOCOR) 20 MG tablet Take 1 tablet (20 mg total) by mouth at bedtime.   No facility-administered encounter medications on file as of 04/23/2016.     No Known Allergies  Review of Systems  Constitutional: Negative for activity change, appetite change and fatigue.  HENT: Negative for congestion and postnasal drip.   Eyes: Positive for redness and itching. Negative for visual disturbance.  Respiratory: Negative for cough, choking and shortness of breath.   Cardiovascular: Positive for chest pain. Negative for palpitations and leg swelling.  Gastrointestinal: Negative for constipation and diarrhea.  Genitourinary: Negative for difficulty urinating and frequency.  Musculoskeletal: Positive for arthralgias and back pain.       Using a cane today  Neurological: Negative for dizziness and headaches.  Psychiatric/Behavioral: Positive for dysphoric mood. Negative for self-injury, sleep disturbance and suicidal ideas. The patient is not nervous/anxious.        At times    BP 120/80 (BP Location: Right Arm, Patient Position: Sitting, Cuff Size: Normal)   Pulse 76   Temp 98.8 F (37.1 C) (Oral)   Resp 18   Ht 5\' 4"  (1.626 m)   Wt 192 lb (87.1 kg)   SpO2 96%   BMI 32.96 kg/m   Physical Exam  Constitutional: She is oriented to person, place, and time. She appears well-developed and well-nourished.  HENT:  Head: Normocephalic and atraumatic.  Mouth/Throat: Oropharynx is clear and moist.  Eyes: Conjunctivae are normal. Pupils are equal, round, and reactive to light.    Neck: Normal range of motion.  Cardiovascular: Normal rate, regular rhythm and normal heart sounds.   EKG abnormal  Pulmonary/Chest: Effort normal and breath sounds normal. No respiratory distress.  Musculoskeletal: Normal range of  motion. She exhibits no edema.  Lymphadenopathy:    She has no cervical adenopathy.  Neurological: She is alert and oriented to person, place, and time.  Antalgic gait, using cane  Psychiatric: She has a normal mood and affect. Her behavior is normal.    ASSESSMENT/PLAN:  1. Essential hypertension, benign Well-controlled hypertension. Continue current medication - CBC - Comprehensive metabolic panel - Lipid panel - Urinalysis, Routine w reflex microscopic (not at [facility]) - EKG 12-Lead - Ambulatory referral to Cardiology  2. Hyperlipidemia, unspecified hyperlipidemia type Compliant with statin. Needs lipid panel - CBC - Comprehensive metabolic panel - Lipid panel - Urinalysis, Routine w reflex microscopic (not at [facility]) - EKG 12-Lead - Ambulatory referral to Cardiology  3. Chest tightness or pressure Refer to cardiology  4. Infection of left tear duct  - Ambulatory referral to Ophthalmology  5. Spondylosis of lumbar region without myelopathy or radiculopathy  - Ambulatory referral to Physical Therapy   Patient Instructions  Continue current medicines  Referral to PT for your back pain  Referral to ophthalmology for the eye problem  Referral to cardiology for the chest pressure  Continue to eat well and stay active  See me in a couple of months  Due for fasting lab work for next visit     Raylene Everts, MD

## 2016-04-30 ENCOUNTER — Ambulatory Visit (HOSPITAL_COMMUNITY): Payer: Medicare Other | Attending: Family Medicine | Admitting: Physical Therapy

## 2016-04-30 DIAGNOSIS — M545 Low back pain, unspecified: Secondary | ICD-10-CM

## 2016-04-30 DIAGNOSIS — R29898 Other symptoms and signs involving the musculoskeletal system: Secondary | ICD-10-CM | POA: Insufficient documentation

## 2016-04-30 DIAGNOSIS — G8929 Other chronic pain: Secondary | ICD-10-CM | POA: Diagnosis not present

## 2016-04-30 DIAGNOSIS — M6281 Muscle weakness (generalized): Secondary | ICD-10-CM | POA: Insufficient documentation

## 2016-04-30 DIAGNOSIS — R293 Abnormal posture: Secondary | ICD-10-CM | POA: Diagnosis not present

## 2016-04-30 NOTE — Therapy (Signed)
[place], [place], [address] Phone: [phone]   Fax:  [phone]  Physical Therapy Evaluation  Patient Details  Name: Donna Woodward MRN: [id number] Date of Birth: 1955/11/26 Referring Provider: Raylene Everts   Encounter Date: 04/30/2016      PT End of Session - 04/30/16 1659    Visit Number 1   Number of Visits 12   Date for PT Re-Evaluation 05/21/16   Authorization Type Medicare    Authorization Time Period 04/30/16 06/11/16   Authorization - Visit Number 1   Authorization - Number of Visits 10   PT Start Time 1520   PT Stop Time 1558   PT Time Calculation (min) 38 min   Activity Tolerance Patient tolerated treatment well   Behavior During Therapy [facility] for tasks assessed/performed      Past Medical History:  Diagnosis Date  . Anxiety   . Back pain   . Degenerative joint disease (DJD) of lumbar spine 03/24/2016  . Depression   . GERD (gastroesophageal reflux disease)    if overweight  . Hyperlipidemia   . Hypertension     Past Surgical History:  Procedure Laterality Date  . BACK SURGERY    . [place]   lamnectomy L5S1    There were no vitals filed for this visit.       Subjective Assessment - 04/30/16 1523    Subjective Patient has had her back pain for quite some time; the most recent flare up happened when she was simply walking and ended up flaring to her knees, this happened in 2014. It is hard for her to be up for too long, extended tasks are hard. No falls recently. No bowel or bladder changes, no incontinence.    Pertinent History beta-blockers, HTN, history of laminectomy 1990   How long can you sit comfortably? immediate discomfort, frequent shifting    How long can you stand comfortably? 60 minutes    How long can you walk comfortably? varies- maybe a few hundred feet    Diagnostic tests done in 2014, degenerative changes noted    Patient Stated Goals reduce pain  to a functional level   Currently in Pain? Yes   Pain Score 8   distress level does not match pain rating    Pain Location Back   Pain Orientation Right;Left   Pain Descriptors / Indicators Discomfort;Dull   Pain Type Chronic pain   Pain Radiating Towards none    Pain Onset More than a month ago   Pain Frequency Constant   Aggravating Factors  being up for extended periods, extended work projects    Pain Relieving Factors ice    Effect of Pain on Daily Activities severe limitations on daily activities            [facility] PT Assessment - 04/30/16 0001      Assessment   Medical Diagnosis lumbar pain    Referring Provider Raylene Everts    Onset Date/Surgical Date --  chronic    Next MD Visit Dr. Meda Coffee in January    Prior Therapy PT in the [place]   Has the patient fallen in the past 6 months No   Has the patient had a decrease in activity level because of a fear of falling?  No   Is the patient reluctant to leave their home because of a fear of falling?  No  Prior Function   Level of Independence Independent;Independent with basic ADLs;Independent with gait;Independent with transfers   Vocation On disability     Observation/Other Assessments   Observations scour/FABER (-) B; repeated lumbar motions increased pain with flexion and extension    Focus on Therapeutic Outcomes (FOTO)  60%     Posture/Postural Control   Posture/Postural Control Postural limitations   Postural Limitations Rounded Shoulders;Forward head;Increased lumbar lordosis     AROM   Lumbar Flexion severe limitation, 1 foot from floor with fingertips    Lumbar Extension severe limitation    Lumbar - Right Side Bend severe limitation    Lumbar - Left Side Bend severe limitation    Thoracic Flexion severe limiation    Thoracic Extension severe limitation    Thoracic - Right Side Bend severe limiation    Thoracic - Left Side Bend severe limitation    Thoracic - Right Rotation  severe limitation    Thoracic - Left Rotation severe limitation      Strength   Overall Strength Comments trunk strength approximately 4-/5 to 4/5   Right Hip Flexion 3-/5   Right Hip ABduction 3-/5   Left Hip Flexion 3-/5   Left Hip ABduction 3-/5   Right Knee Flexion 4-/5   Right Knee Extension 3+/5   Left Knee Flexion 4-/5   Left Knee Extension 3+/5   Right Ankle Dorsiflexion 3/5   Left Ankle Dorsiflexion 3/5     Palpation   Spinal mobility severe limitation PAs sacrum-approx T5      Ambulation/Gait   Gait Comments flexed at hips, rigidity ntoed in bilaterall hips and spine, possible scoliosis, antallgic gait                            PT Education - 04/30/16 1658    Education provided Yes   Education Details prognosis, POC, HEP will be given next session, chronic pain dynamics    Person(s) Educated Patient   Methods Explanation;Demonstration   Comprehension Verbalized understanding          PT Short Term Goals - 04/30/16 1707      PT SHORT TERM GOAL #1   Title Patient to demonstrate only moderate limitation in lumbar spine/pelvis in order to assist in improving mechanics/reducing pain    Time 3   Period Weeks   Status New     PT SHORT TERM GOAL #2   Title Patient to demonstrate only moderate limitation in thoracic spine in order to assist in improving mechanics/reducing pain    Time 3   Period Weeks   Status New     PT SHORT TERM GOAL #3   Title Patient to experience no more than 4/10 pain in order to improve QOL and improve functional task tolerance    Time 3   Period Weeks   Status New     PT SHORT TERM GOAL #4   Title Patient to demonstrate only moderate limitation in end-feel of PAs in lumbar and thoracic spine to evidence general improvement of condition    Time 3   Period Weeks   Status New     PT SHORT TERM GOAL #5   Title Patient to be participatory in correctly and consistently performing appropriate HEP, to be updated PRN     Time 1   Period Weeks   Status New           PT Long Term Goals - 04/30/16 1709  PT LONG TERM GOAL #1   Title Patient to demonstrate only mild mobility limitation in lumbar/thoracic spines and pelvis in order to assist in reducing pain and improving mechanics    Time 6   Period Weeks   Status New     PT LONG TERM GOAL #2   Title Patient to demonstrate correct functional mechanics for proper lifting technique in order to assist in preventing exacerbation of pain    Time 6   Period Weeks   Status New     PT LONG TERM GOAL #3   Title Patient to experience no more than 2/10 pain with all functional activities in order to improve QOL and improve functional task tolerance    Time 6   Period Weeks   Status New     PT LONG TERM GOAL #4   Title Patient to be participatory in regular aerobic physical activity program, at least 20 minutes in duration and at least 4 days per week in order to maintain functional gains and improve general health status    Time 6   Period Weeks   Status New               Plan - 04/30/16 1700    Clinical Impression Statement Patient arrives today stating that she was doing quite well living in [place] and 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 Upon examination, patient reveals severe limitation throughout lumbar/thoracic spines and pelvis, postural and gait deficits, severe functional weakness, and reduced ability to tolerance extended functional tasks at this time. She will benefit from skilled PT services in order to address functional limitations and assist in reaching optimal level of function.    Rehab Potential Good   Clinical Impairments Affecting Rehab Potential (+) good response/pain reduction to physical training in the past, (-) chronicity of pain, (+) high motivation to participate in skilled PT services    PT  Frequency 2x / week   PT Duration 6 weeks   PT Treatment/Interventions ADLs/Self Care Home Management;Biofeedback;Cryotherapy;Moist Heat;DME Instruction;Gait training;Stair training;Functional mobility training;Therapeutic activities;Therapeutic exercise;Balance training;Neuromuscular re-education;Patient/family education;Manual techniques;Energy conservation;Passive range of motion;Taping   PT Next Visit Plan assign HEP: hip and lumbar mobility, functional stretches! Next session, focus on mobility of lumbar/thoracic spine/pelvis, proximal strength, core activation    PT Home Exercise Plan assign second session    Consulted and Agree with Plan of Care Patient      Patient will benefit from skilled therapeutic intervention in order to improve the following deficits and impairments:  Abnormal gait, Pain, Improper body mechanics, Decreased coordination, Decreased mobility, Increased muscle spasms, Postural dysfunction, Decreased activity tolerance, Decreased range of motion, Decreased strength, Hypomobility, Decreased balance, Difficulty walking, Impaired flexibility  Visit Diagnosis: Chronic bilateral low back pain without sciatica - Plan: PT plan of care cert/re-cert  Muscle weakness (generalized) - Plan: PT plan of care cert/re-cert  Abnormal posture - Plan: PT plan of care cert/re-cert  Other symptoms and signs involving the musculoskeletal system - Plan: PT plan of care cert/re-cert      G-Codes - 0000000 [id number]    Functional Assessment Tool Used Based on skilled clinical assessment of strength, ROM/mobility, posture, gait, pain patterns    Functional Limitation Mobility: Walking and moving around   Mobility: Walking and Moving Around Current Status [id number]) At least 40 percent but less than 60 percent impaired,  limited or restricted   Mobility: Walking and Moving Around Goal Status [phone]) At least 20 percent but less than 40 percent impaired, limited or restricted       Problem  List Patient Active Problem List   Diagnosis Date Noted  . Degenerative joint disease (DJD) of lumbar spine 03/24/2016  . Depression (emotion) 03/24/2016  . Obesity, unspecified 08/14/2015  . Essential hypertension, benign 04/29/2015  . Hyperlipidemia 04/29/2015    Deniece Ree PT, DPT [place] [address] [place], [place], [address] Phone: [phone]   Fax:  [phone]  Name: Memphis Menden MRN: [id number] Date of Birth: 1955/09/26

## 2016-05-06 ENCOUNTER — Ambulatory Visit (HOSPITAL_COMMUNITY): Payer: Medicare Other | Admitting: Physical Therapy

## 2016-05-06 DIAGNOSIS — G8929 Other chronic pain: Secondary | ICD-10-CM

## 2016-05-06 DIAGNOSIS — R29898 Other symptoms and signs involving the musculoskeletal system: Secondary | ICD-10-CM | POA: Diagnosis not present

## 2016-05-06 DIAGNOSIS — M545 Low back pain: Principal | ICD-10-CM

## 2016-05-06 DIAGNOSIS — M6281 Muscle weakness (generalized): Secondary | ICD-10-CM

## 2016-05-06 DIAGNOSIS — R293 Abnormal posture: Secondary | ICD-10-CM | POA: Diagnosis not present

## 2016-05-06 NOTE — Therapy (Signed)
[facility] [address] [place], [place], [address] Phone: [phone]   Fax:  [phone]  Physical Therapy Treatment  Patient Details  Name: Donna Woodward MRN: [id number] Date of Birth: 10-Apr-1956 Referring Provider: Raylene Everts   Encounter Date: 05/06/2016      PT End of Session - 05/06/16 1323    Visit Number 2   Number of Visits 12   Date for PT Re-Evaluation 05/21/16   Authorization Type Medicare    Authorization Time Period 04/30/16 06/11/16   Authorization - Visit Number 2   Authorization - Number of Visits 10   PT Start Time 1301   PT Stop Time 1340   PT Time Calculation (min) 39 min   Activity Tolerance Patient tolerated treatment well   Behavior During Therapy [facility] for tasks assessed/performed      Past Medical History:  Diagnosis Date  . Anxiety   . Back pain   . Degenerative joint disease (DJD) of lumbar spine 03/24/2016  . Depression   . GERD (gastroesophageal reflux disease)    if overweight  . Hyperlipidemia   . Hypertension     Past Surgical History:  Procedure Laterality Date  . BACK SURGERY    . [place]   lamnectomy L5S1    There were no vitals filed for this visit.      Subjective Assessment - 05/06/16 1307    Subjective Pt reports increased pain today up to 9/10. She noticed that her Lt toes were numb during her shower today. She has no complaints other than that.    Pertinent History beta-blockers, HTN, history of laminectomy 1990   How long can you sit comfortably? immediate discomfort, frequent shifting    How long can you stand comfortably? 60 minutes    How long can you walk comfortably? varies- maybe a few hundred feet    Diagnostic tests done in 2014, degenerative changes noted    Patient Stated Goals reduce pain to a functional level   Currently in Pain? Yes   Pain Score 9    Pain Location Back   Pain Orientation Right;Left  dull ache   Pain Onset More than a  month ago                         [facility] Adult PT Treatment/Exercise - 05/06/16 0001      Exercises   Exercises Lumbar     Lumbar Exercises: Standing   Other Standing Lumbar Exercises repeated flexion x10 reps,; repeated extension x10 reps      Lumbar Exercises: Supine   Ab Set 5 reps;5 seconds   Bridge Other (comment)  attempted but pt unable to lift to neutral    Other Supine Lumbar Exercises straight leg hip ext into table 2x5 sec each      Lumbar Exercises: Prone   Other Prone Lumbar Exercises pressups on elbows 2x10 reps                PT Education - 05/06/16 1321    Education provided Yes   Education Details POC and purpose of stabilization exercises; began HEP   Person(s) Educated Patient   Methods Explanation;Demonstration;Handout   Comprehension Verbalized understanding;Returned demonstration          PT Short Term Goals - 04/30/16 1707      PT SHORT TERM GOAL #1   Title Patient to demonstrate only moderate limitation in lumbar spine/pelvis in  order to assist in improving mechanics/reducing pain    Time 3   Period Weeks   Status New     PT SHORT TERM GOAL #2   Title Patient to demonstrate only moderate limitation in thoracic spine in order to assist in improving mechanics/reducing pain    Time 3   Period Weeks   Status New     PT SHORT TERM GOAL #3   Title Patient to experience no more than 4/10 pain in order to improve QOL and improve functional task tolerance    Time 3   Period Weeks   Status New     PT SHORT TERM GOAL #4   Title Patient to demonstrate only moderate limitation in end-feel of PAs in lumbar and thoracic spine to evidence general improvement of condition    Time 3   Period Weeks   Status New     PT SHORT TERM GOAL #5   Title Patient to be participatory in correctly and consistently performing appropriate HEP, to be updated PRN    Time 1   Period Weeks   Status New           PT Long Term Goals -  04/30/16 1709      PT LONG TERM GOAL #1   Title Patient to demonstrate only mild mobility limitation in lumbar/thoracic spines and pelvis in order to assist in reducing pain and improving mechanics    Time 6   Period Weeks   Status New     PT LONG TERM GOAL #2   Title Patient to demonstrate correct functional mechanics for proper lifting technique in order to assist in preventing exacerbation of pain    Time 6   Period Weeks   Status New     PT LONG TERM GOAL #3   Title Patient to experience no more than 2/10 pain with all functional activities in order to improve QOL and improve functional task tolerance    Time 6   Period Weeks   Status New     PT LONG TERM GOAL #4   Title Patient to be participatory in regular aerobic physical activity program, at least 20 minutes in duration and at least 4 days per week in order to maintain functional gains and improve general health status    Time 6   Period Weeks   Status New               Plan - 05/06/16 1328    Clinical Impression Statement Pt arrived with increased pain this session. Introduced therex for improved lumbar mobility with centralization of symptoms in prone pressup position, however unable to confirm this secondary to pt difficulty describing nature of her symptoms. Introduced several exercises for her HEP with good tolerance and without increase in pain/symptoms. Ended session with no change in pain.    Rehab Potential Good   Clinical Impairments Affecting Rehab Potential (+) good response/pain reduction to physical training in the past, (-) chronicity of pain, (+) high motivation to participate in skilled PT services    PT Frequency 2x / week   PT Duration 6 weeks   PT Treatment/Interventions ADLs/Self Care Home Management;Biofeedback;Cryotherapy;Moist Heat;DME Instruction;Gait training;Stair training;Functional mobility training;Therapeutic activities;Therapeutic exercise;Balance training;Neuromuscular  re-education;Patient/family education;Manual techniques;Energy conservation;Passive range of motion;Taping   PT Next Visit Plan assign HEP: hip and lumbar mobility, functional stretches! Next session, focus on mobility of lumbar/thoracic spine/pelvis, proximal strength, core activation    PT Home Exercise Plan lower trunk rotation x15 reps,  bridge, ab Counsellor and Agree with Plan of Care Patient      Patient will benefit from skilled therapeutic intervention in order to improve the following deficits and impairments:  Abnormal gait, Pain, Improper body mechanics, Decreased coordination, Decreased mobility, Increased muscle spasms, Postural dysfunction, Decreased activity tolerance, Decreased range of motion, Decreased strength, Hypomobility, Decreased balance, Difficulty walking, Impaired flexibility  Visit Diagnosis: Chronic bilateral low back pain without sciatica  Muscle weakness (generalized)  Abnormal posture  Other symptoms and signs involving the musculoskeletal system     Problem List Patient Active Problem List   Diagnosis Date Noted  . Degenerative joint disease (DJD) of lumbar spine 03/24/2016  . Depression (emotion) 03/24/2016  . Obesity, unspecified 08/14/2015  . Essential hypertension, benign 04/29/2015  . Hyperlipidemia 04/29/2015    3:37 PM,05/06/16 Elly Modena PT, DPT [facility] [address] [place], [place], [address] Phone: [phone]   Fax:  [phone]  Name: Donna Woodward MRN: [id number] Date of Birth: 1955/08/23

## 2016-05-06 NOTE — Progress Notes (Signed)
Cardiology Office Note   Date:  05/07/2016   ID:  Donna Woodward, DOB 1955/10/10, MRN [id number]  PCP:  Raylene Everts, MD  Cardiologist:   Jenkins Rouge, MD   No chief complaint on file.     History of Present Illness: Donna Woodward is a 60 y.o. female who presents for evaluation of chest pain. CRF;s HTN and elevated lipids compliant with meds Previous smoker   Goes To PT for chronic back pain. 04/23/16 complained to primary when she walks briskly she feels a pressure sensation in the center for chest. No shortness of breath, no lightheadedness or diaphoresis. This sensation goes away with rest. She rates the pain as "moderate".  Last stress test normal over 10 years ago   Pain is primarily left shoulder trapezius and arm. Worse last 6 months There is an exertional component She is hesitant to see ortho as prior back surgery was not helpful but has bilateral shoulder issues as well now  Quit smoking in 2008 mild cough now     Past Medical History:  Diagnosis Date  . Anxiety   . Back pain   . Degenerative joint disease (DJD) of lumbar spine 03/24/2016  . Depression   . GERD (gastroesophageal reflux disease)    if overweight  . Hyperlipidemia   . Hypertension     Past Surgical History:  Procedure Laterality Date  . BACK SURGERY    . SPINE SURGERY  1990   lamnectomy L5S1     Current Outpatient Prescriptions  Medication Sig Dispense Refill  . amLODipine (NORVASC) 10 MG tablet Take 1 tablet (10 mg total) by mouth daily. 90 tablet 1  . lisinopril-hydrochlorothiazide (PRINZIDE,ZESTORETIC) 20-25 MG tablet Take 1 tablet by mouth daily. 90 tablet 1  . metoprolol succinate (TOPROL-XL) 50 MG 24 hr tablet Take 1 tablet (50 mg total) by mouth daily. Take with or immediately following a meal. 90 tablet 1  . omega-3 fish oil (MAXEPA) 1000 MG CAPS capsule Take 1 capsule (1,000 mg total) by mouth 2 (two) times daily. Reported on 11/14/2015 180 each 1  . simvastatin (ZOCOR) 20 MG  tablet Take 1 tablet (20 mg total) by mouth at bedtime. 90 tablet 1   No current facility-administered medications for this visit.     Allergies:   Patient has no known allergies.    Social History:  The patient  reports that she quit smoking about 9 years ago. She has never used smokeless tobacco. She reports that she does not drink alcohol or use drugs.   Family History:  The patient's family history includes CAD (age of onset: 49) in her sister; Cancer (age of onset: 63) in her sister; Dementia in her father; Heart disease in her father; Hypertension in her mother, sister, sister, and sister; Stroke in her father.    ROS:  Please see the history of present illness.   Otherwise, review of systems are positive for none.   All other systems are reviewed and negative.    PHYSICAL EXAM: VS:  BP 102/64   Pulse 73   Ht 5\' 5"  (1.651 m)   Wt 85.7 kg (189 lb)   SpO2 96%   BMI 31.45 kg/m  , BMI Body mass index is 31.45 kg/m. Affect appropriate Healthy:  appears stated age 51: normal Neck supple with no adenopathy JVP normal no bruits no thyromegaly Lungs clear with no wheezing and good diaphragmatic motion Heart:  S1/S2 no murmur, no rub, gallop or click PMI normal  Abdomen: benighn, BS positve, no tenderness, no AAA no bruit.  No HSM or HJR Distal pulses intact with no bruits No edema Neuro non-focal back pain  Skin warm and dry Mild limitation in ROM shoulders left worse than right     EKG:  04/23/16 low atrial foci inferior T wave inversions nonspecific lateral T wave changes    Recent Labs: 11/06/2015: ALT 15; BUN 14; Creat 0.71; Potassium 3.6; Sodium 140    Lipid Panel    Component Value Date/Time   CHOL 171 11/06/2015 1126   TRIG 129 11/06/2015 1126   HDL 33 (L) 11/06/2015 1126   CHOLHDL 5.2 (H) 11/06/2015 1126   VLDL 26 11/06/2015 1126   LDLCALC 112 11/06/2015 1126      Wt Readings from Last 3 Encounters:  05/07/16 85.7 kg (189 lb)  04/23/16 87.1 kg (192  lb)  03/24/16 85.7 kg (189 lb)      Other studies Reviewed: Additional studies/ records that were reviewed today include: labs primary care note ECG.    ASSESSMENT AND PLAN:  1.  Chest Pain atypical abnormal ECG unable to walk on treadmill due to back pain f/u Lexiscan myovue  2. HTN: Well controlled.  Continue current medications and low sodium Dash type diet.   3. Chol: continue statin labs with primary  4. Back Pain  PT encouraged her to see spine specialist again may benefit from acupuncture    Current medicines are reviewed at length with the patient today.  The patient does not have concerns regarding medicines.  The following changes have been made:  no change  Labs/ tests ordered today include: Lexiscan Myovue  No orders of the defined types were placed in this encounter.    Disposition:   FU with [place] PRN      Signed, Jenkins Rouge, MD  05/07/2016 1:57 PM    [facility] [place], [place], [place]  [address] Phone: [phone]; Fax: [phone]

## 2016-05-07 ENCOUNTER — Encounter: Payer: Self-pay | Admitting: *Deleted

## 2016-05-07 ENCOUNTER — Encounter: Payer: Self-pay | Admitting: Cardiovascular Disease

## 2016-05-07 ENCOUNTER — Ambulatory Visit (INDEPENDENT_AMBULATORY_CARE_PROVIDER_SITE_OTHER): Payer: Medicare Other | Admitting: Cardiovascular Disease

## 2016-05-07 VITALS — BP 102/64 | HR 73 | Ht 65.0 in | Wt 189.0 lb

## 2016-05-07 DIAGNOSIS — R079 Chest pain, unspecified: Secondary | ICD-10-CM | POA: Diagnosis not present

## 2016-05-07 DIAGNOSIS — R0789 Other chest pain: Secondary | ICD-10-CM

## 2016-05-07 NOTE — Patient Instructions (Signed)
Your physician recommends that you schedule a follow-up appointment As Needed   Your physician recommends that you continue on your current medications as directed. Please refer to the Current Medication list given to you today.  Your physician has requested that you have a lexiscan myoview. For further information please visit [email or web address]. Please follow instruction sheet, as given.  Thank you for choosing [facility]!

## 2016-05-08 DIAGNOSIS — D2312 Other benign neoplasm of skin of left eyelid, including canthus: Secondary | ICD-10-CM | POA: Diagnosis not present

## 2016-05-09 ENCOUNTER — Ambulatory Visit (HOSPITAL_COMMUNITY): Payer: Medicare Other

## 2016-05-09 DIAGNOSIS — R293 Abnormal posture: Secondary | ICD-10-CM | POA: Diagnosis not present

## 2016-05-09 DIAGNOSIS — M545 Low back pain: Principal | ICD-10-CM

## 2016-05-09 DIAGNOSIS — R29898 Other symptoms and signs involving the musculoskeletal system: Secondary | ICD-10-CM

## 2016-05-09 DIAGNOSIS — G8929 Other chronic pain: Secondary | ICD-10-CM

## 2016-05-09 DIAGNOSIS — M6281 Muscle weakness (generalized): Secondary | ICD-10-CM | POA: Diagnosis not present

## 2016-05-09 NOTE — Therapy (Signed)
[facility] [address] [place], [place], [address] Phone: [phone]   Fax:  [phone]  Physical Therapy Treatment  Patient Details  Name: Donna Woodward MRN: [id number] Date of Birth: 1955-10-16 Referring Provider: Raylene Everts   Encounter Date: 05/09/2016      PT End of Session - 05/09/16 [id number]    Visit Number 3   Number of Visits 12   Date for PT Re-Evaluation 05/21/16   Authorization Type Medicare    Authorization Time Period 04/30/16 06/11/16   Authorization - Visit Number 3   Authorization - Number of Visits 10   PT Start Time [id number]   PT Stop Time 1431   PT Time Calculation (min) 42 min   Activity Tolerance Patient tolerated treatment well   Behavior During Therapy [facility] for tasks assessed/performed      Past Medical History:  Diagnosis Date  . Anxiety   . Back pain   . Degenerative joint disease (DJD) of lumbar spine 03/24/2016  . Depression   . GERD (gastroesophageal reflux disease)    if overweight  . Hyperlipidemia   . Hypertension     Past Surgical History:  Procedure Laterality Date  . BACK SURGERY    . [place]   lamnectomy L5S1    There were no vitals filed for this visit.      Subjective Assessment - 05/09/16 1353    Subjective Pt stated she feels she is standing up right better today.  Current pain scale 8.4/10 on Rt side of lumbar region.  No reports of radicular symptoms today.  Does have 2 toes and dorsal aspect of Lt foot that are numb.     Pertinent History beta-blockers, HTN, history of laminectomy 1990   Patient Stated Goals reduce pain to a functional level   Currently in Pain? Yes   Pain Score 8    Pain Location Back   Pain Orientation Lower;Right   Pain Descriptors / Indicators Aching;Throbbing   Pain Type Chronic pain   Pain Radiating Towards none   Pain Onset More than a month ago   Pain Frequency Constant   Aggravating Factors  being up for extended periods,  extended work projects   Pain Relieving Factors ice   Effect of Pain on Daily Activities severe limitations on daily activties                         [facility] Adult PT Treatment/Exercise - 05/09/16 0001      Lumbar Exercises: Stretches   Active Hamstring Stretch 2 reps;30 seconds   Active Hamstring Stretch Limitations supine with rope   Single Knee to Chest Stretch 3 reps;30 seconds   Lower Trunk Rotation 5 reps;10 seconds   Prone on Elbows Stretch 2 reps;20 seconds   Press Ups 5 reps;10 seconds     Lumbar Exercises: Supine   Ab Set 10 reps;3 seconds   AB Set Limitations tactile cueing   Glut Set 10 reps;5 seconds   Other Supine Lumbar Exercises lower trunk rotation x10 reps      Lumbar Exercises: Prone   Other Prone Lumbar Exercises heel squeeze 5x 5"                  PT Short Term Goals - 04/30/16 1707      PT SHORT TERM GOAL #1   Title Patient to demonstrate only moderate limitation in lumbar spine/pelvis in order to assist in  improving mechanics/reducing pain    Time 3   Period Weeks   Status New     PT SHORT TERM GOAL #2   Title Patient to demonstrate only moderate limitation in thoracic spine in order to assist in improving mechanics/reducing pain    Time 3   Period Weeks   Status New     PT SHORT TERM GOAL #3   Title Patient to experience no more than 4/10 pain in order to improve QOL and improve functional task tolerance    Time 3   Period Weeks   Status New     PT SHORT TERM GOAL #4   Title Patient to demonstrate only moderate limitation in end-feel of PAs in lumbar and thoracic spine to evidence general improvement of condition    Time 3   Period Weeks   Status New     PT SHORT TERM GOAL #5   Title Patient to be participatory in correctly and consistently performing appropriate HEP, to be updated PRN    Time 1   Period Weeks   Status New           PT Long Term Goals - 04/30/16 1709      PT LONG TERM GOAL #1   Title  Patient to demonstrate only mild mobility limitation in lumbar/thoracic spines and pelvis in order to assist in reducing pain and improving mechanics    Time 6   Period Weeks   Status New     PT LONG TERM GOAL #2   Title Patient to demonstrate correct functional mechanics for proper lifting technique in order to assist in preventing exacerbation of pain    Time 6   Period Weeks   Status New     PT LONG TERM GOAL #3   Title Patient to experience no more than 2/10 pain with all functional activities in order to improve QOL and improve functional task tolerance    Time 6   Period Weeks   Status New     PT LONG TERM GOAL #4   Title Patient to be participatory in regular aerobic physical activity program, at least 20 minutes in duration and at least 4 days per week in order to maintain functional gains and improve general health status    Time 6   Period Weeks   Status New               Plan - 05/09/16 1408    Clinical Impression Statement Session focus on improving spinal mobilty and proximal musculature strengthening.  Pt limited by pain through session however had difficulty describing nature of symptoms.  Therapist facilitation for proper form and technqiue.  Added hamstring stretches to improve mobility wtih reports of numbness Lt shin and dorsal aspect and increased warmth Rt foot.  Resumed prone pressups following hamstring stretch with positive results following with no tingling or numbness for LE.  EOS pt reports no change in pain.     Rehab Potential Good   Clinical Impairments Affecting Rehab Potential (+) good response/pain reduction to physical training in the past, (-) chronicity of pain, (+) high motivation to participate in skilled PT services    PT Frequency 2x / week   PT Duration 6 weeks   PT Treatment/Interventions ADLs/Self Care Home Management;Biofeedback;Cryotherapy;Moist Heat;DME Instruction;Gait training;Stair training;Functional mobility training;Therapeutic  activities;Therapeutic exercise;Balance training;Neuromuscular re-education;Patient/family education;Manual techniques;Energy conservation;Passive range of motion;Taping   PT Next Visit Plan hip and lumbar mobility, functional stretches! Next session, focus on mobility of  lumbar/thoracic spine/pelvis, proximal strength, core activation       Patient will benefit from skilled therapeutic intervention in order to improve the following deficits and impairments:  Abnormal gait, Pain, Improper body mechanics, Decreased coordination, Decreased mobility, Increased muscle spasms, Postural dysfunction, Decreased activity tolerance, Decreased range of motion, Decreased strength, Hypomobility, Decreased balance, Difficulty walking, Impaired flexibility  Visit Diagnosis: Chronic bilateral low back pain without sciatica  Muscle weakness (generalized)  Abnormal posture  Other symptoms and signs involving the musculoskeletal system     Problem List Patient Active Problem List   Diagnosis Date Noted  . Degenerative joint disease (DJD) of lumbar spine 03/24/2016  . Depression (emotion) 03/24/2016  . Obesity, unspecified 08/14/2015  . Essential hypertension, benign 04/29/2015  . Hyperlipidemia 04/29/2015   Ihor Austin, [place]; [place]  Aldona Lento 05/09/2016, 2:36 PM  [facility] [place], [place], [address] Phone: [phone]   Fax:  [phone]  Name: Donna Woodward MRN: [id number] Date of Birth: 1955-11-04

## 2016-05-13 ENCOUNTER — Ambulatory Visit (HOSPITAL_COMMUNITY): Payer: Medicare Other | Admitting: Physical Therapy

## 2016-05-13 DIAGNOSIS — R29898 Other symptoms and signs involving the musculoskeletal system: Secondary | ICD-10-CM | POA: Diagnosis not present

## 2016-05-13 DIAGNOSIS — M545 Low back pain: Secondary | ICD-10-CM | POA: Diagnosis not present

## 2016-05-13 DIAGNOSIS — R293 Abnormal posture: Secondary | ICD-10-CM | POA: Diagnosis not present

## 2016-05-13 DIAGNOSIS — M6281 Muscle weakness (generalized): Secondary | ICD-10-CM | POA: Diagnosis not present

## 2016-05-13 DIAGNOSIS — G8929 Other chronic pain: Secondary | ICD-10-CM

## 2016-05-13 NOTE — Therapy (Signed)
[facility] [address] [place], [place], [address] Phone: [phone]   Fax:  [phone]  Physical Therapy Treatment  Patient Details  Name: Donna Woodward MRN: [id number] Date of Birth: Jun 27, 1956 Referring Provider: Raylene Everts   Encounter Date: 05/13/2016      PT End of Session - 05/13/16 1738    Visit Number 4   Number of Visits 12   Date for PT Re-Evaluation 05/21/16   Authorization Type Medicare    Authorization Time Period 04/30/16 06/11/16   Authorization - Visit Number 4   Authorization - Number of Visits 10   PT Start Time [id number]   PT Stop Time 1728   PT Time Calculation (min) 38 min   Activity Tolerance Patient tolerated treatment well   Behavior During Therapy [facility] for tasks assessed/performed      Past Medical History:  Diagnosis Date  . Anxiety   . Back pain   . Degenerative joint disease (DJD) of lumbar spine 03/24/2016  . Depression   . GERD (gastroesophageal reflux disease)    if overweight  . Hyperlipidemia   . Hypertension     Past Surgical History:  Procedure Laterality Date  . BACK SURGERY    . [place]   lamnectomy L5S1    There were no vitals filed for this visit.      Subjective Assessment - 05/13/16 1653    Subjective Patient states that her pain is still staying pretty high, around 8-9/10; she has not noticed any major changes with what we have been doing with PT so far.    Pertinent History beta-blockers, HTN, history of laminectomy 1990   Currently in Pain? Yes   Pain Score 8    Pain Location Back   Pain Orientation Right                         [facility] Adult PT Treatment/Exercise - 05/13/16 0001      Lumbar Exercises: Stretches   Single Knee to Chest Stretch 5 reps;10 seconds   Single Knee to Chest Stretch Limitations B LE    Lower Trunk Rotation 5 reps;10 seconds   Lower Trunk Rotation Limitations B LE    Piriformis Stretch 3 reps;30 seconds      Lumbar Exercises: Supine   Other Supine Lumbar Exercises hip circles 1x10 each side    Other Supine Lumbar Exercises hip flexor stretch off edge of table, 5x10 seconds each LE         Moist heat x8 minutes lumbar spine in supine, not included in billing         PT Education - 05/13/16 1737    Education provided Yes   Education Details acute vs chronic pain and changes in the brain/nervous system; importance of graded activity/motion in improving chronic back pain    Person(s) Educated Patient   Methods Explanation   Comprehension Verbalized understanding          PT Short Term Goals - 04/30/16 1707      PT SHORT TERM GOAL #1   Title Patient to demonstrate only moderate limitation in lumbar spine/pelvis in order to assist in improving mechanics/reducing pain    Time 3   Period Weeks   Status New     PT SHORT TERM GOAL #2   Title Patient to demonstrate only moderate limitation in thoracic spine in order to assist in improving mechanics/reducing pain    Time  3   Period Weeks   Status New     PT SHORT TERM GOAL #3   Title Patient to experience no more than 4/10 pain in order to improve QOL and improve functional task tolerance    Time 3   Period Weeks   Status New     PT SHORT TERM GOAL #4   Title Patient to demonstrate only moderate limitation in end-feel of PAs in lumbar and thoracic spine to evidence general improvement of condition    Time 3   Period Weeks   Status New     PT SHORT TERM GOAL #5   Title Patient to be participatory in correctly and consistently performing appropriate HEP, to be updated PRN    Time 1   Period Weeks   Status New           PT Long Term Goals - 04/30/16 1709      PT LONG TERM GOAL #1   Title Patient to demonstrate only mild mobility limitation in lumbar/thoracic spines and pelvis in order to assist in reducing pain and improving mechanics    Time 6   Period Weeks   Status New     PT LONG TERM GOAL #2   Title Patient  to demonstrate correct functional mechanics for proper lifting technique in order to assist in preventing exacerbation of pain    Time 6   Period Weeks   Status New     PT LONG TERM GOAL #3   Title Patient to experience no more than 2/10 pain with all functional activities in order to improve QOL and improve functional task tolerance    Time 6   Period Weeks   Status New     PT LONG TERM GOAL #4   Title Patient to be participatory in regular aerobic physical activity program, at least 20 minutes in duration and at least 4 days per week in order to maintain functional gains and improve general health status    Time 6   Period Weeks   Status New               Plan - 05/13/16 1738    Clinical Impression Statement Patient arrives today continuing to report 8-9/10 pain today (level of distress does not match pain rating); began session with moist heat to assist in loosening soft tissues and then performed functional stretching and strengthening today to tolerance. Cues provided PRN for form today as well. Introduced more extensive hip stretching and mobility today to attempt to assist in improving overall mechanics and pain. Patient reports no change in pain throughout and at EOS.    Rehab Potential Good   Clinical Impairments Affecting Rehab Potential (+) good response/pain reduction to physical training in the past, (-) chronicity of pain, (+) high motivation to participate in skilled PT services    PT Frequency 2x / week   PT Duration 6 weeks   PT Treatment/Interventions ADLs/Self Care Home Management;Biofeedback;Cryotherapy;Moist Heat;DME Instruction;Gait training;Stair training;Functional mobility training;Therapeutic activities;Therapeutic exercise;Balance training;Neuromuscular re-education;Patient/family education;Manual techniques;Energy conservation;Passive range of motion;Taping   PT Next Visit Plan hip and lumbar mobility, functional stretches! Next session, focus on mobility  of lumbar/thoracic spine/pelvis, proximal strength, core activation . Trial PAs to lumbar and thoracic spine.    PT Home Exercise Plan lower trunk rotation x15 reps, bridge, ab set   Consulted and Agree with Plan of Care Patient      Patient will benefit from skilled therapeutic intervention in order to improve  the following deficits and impairments:  Abnormal gait, Pain, Improper body mechanics, Decreased coordination, Decreased mobility, Increased muscle spasms, Postural dysfunction, Decreased activity tolerance, Decreased range of motion, Decreased strength, Hypomobility, Decreased balance, Difficulty walking, Impaired flexibility  Visit Diagnosis: Chronic bilateral low back pain without sciatica  Muscle weakness (generalized)  Abnormal posture  Other symptoms and signs involving the musculoskeletal system     Problem List Patient Active Problem List   Diagnosis Date Noted  . Degenerative joint disease (DJD) of lumbar spine 03/24/2016  . Depression (emotion) 03/24/2016  . Obesity, unspecified 08/14/2015  . Essential hypertension, benign 04/29/2015  . Hyperlipidemia 04/29/2015    Deniece Ree PT, DPT [facility] [address] [place], [place], [address] Phone: [phone]   Fax:  [phone]  Name: Donna Woodward MRN: [id number] Date of Birth: 1955-11-05

## 2016-05-15 ENCOUNTER — Ambulatory Visit (HOSPITAL_COMMUNITY): Payer: Medicare Other

## 2016-05-15 DIAGNOSIS — G8929 Other chronic pain: Secondary | ICD-10-CM | POA: Diagnosis not present

## 2016-05-15 DIAGNOSIS — R293 Abnormal posture: Secondary | ICD-10-CM

## 2016-05-15 DIAGNOSIS — R29898 Other symptoms and signs involving the musculoskeletal system: Secondary | ICD-10-CM

## 2016-05-15 DIAGNOSIS — M545 Low back pain: Principal | ICD-10-CM

## 2016-05-15 DIAGNOSIS — M6281 Muscle weakness (generalized): Secondary | ICD-10-CM

## 2016-05-15 NOTE — Therapy (Signed)
[facility] [address] [place], [place], [address] Phone: [phone]   Fax:  [phone]  Physical Therapy Treatment  Patient Details  Name: Donna Woodward MRN: [id number] Date of Birth: 03-18-1956 Referring Provider: Raylene Everts   Encounter Date: 05/15/2016      PT End of Session - 05/15/16 1616    Visit Number 5   Number of Visits 12   Date for PT Re-Evaluation 05/21/16   Authorization Type Medicare    Authorization Time Period 04/30/16 06/11/16   Authorization - Visit Number 5   Authorization - Number of Visits 10   PT Start Time [id number]   PT Stop Time [id number]   PT Time Calculation (min) 42 min      Past Medical History:  Diagnosis Date  . Anxiety   . Back pain   . Degenerative joint disease (DJD) of lumbar spine 03/24/2016  . Depression   . GERD (gastroesophageal reflux disease)    if overweight  . Hyperlipidemia   . Hypertension     Past Surgical History:  Procedure Laterality Date  . BACK SURGERY    . [place]   lamnectomy L5S1    There were no vitals filed for this visit.      Subjective Assessment - 05/15/16 1609    Subjective Pt stated she continues to be limited by high pain scale lower back, felt really stiff this morning.  Reports of intemittent numbness toes 2 and 3rd yesterday on Lt foot   Pertinent History beta-blockers, HTN, history of laminectomy 1990   Patient Stated Goals reduce pain to a functional level   Currently in Pain? Yes   Pain Score 7    Pain Orientation Lower;Right   Pain Descriptors / Indicators Aching;Throbbing   Pain Type Chronic pain   Pain Radiating Towards none   Pain Onset More than a month ago   Pain Frequency Constant   Aggravating Factors  being up for extended periods, extended work projects   Pain Relieving Factors ice   Effect of Pain on Daily Activities severe limitation on daily activities                [facility] Adult PT Treatment/Exercise -  05/15/16 0001      Lumbar Exercises: Stretches   Single Knee to Chest Stretch 20 seconds;5 reps   Single Knee to Chest Stretch Limitations B LE    Double Knee to Chest Stretch 3 reps;30 seconds   Lower Trunk Rotation 5 reps;30 seconds   Lower Trunk Rotation Limitations B LE    Quad Stretch 3 reps;30 seconds   Quad Stretch Limitations Thomas strech 3x 30"     Lumbar Exercises: Seated   LAQ on Chair Limitations --  5x 3D thoracic excursion with therapist facilitation     Lumbar Exercises: Supine   Ab Set 10 reps;3 seconds   AB Set Limitations verbal and tactile cueing     Manual Therapy   Manual Therapy Joint mobilization   Manual therapy comments Manual complete separate rest of tx   Joint Mobilization PA to L4, L3, L2 grade 2 performed by Deniece Ree, DPT stopped due to radicular symptoms down posterior Rt LE                  PT Short Term Goals - 04/30/16 1707      PT SHORT TERM GOAL #1   Title Patient to demonstrate only moderate limitation in lumbar spine/pelvis in  order to assist in improving mechanics/reducing pain    Time 3   Period Weeks   Status New     PT SHORT TERM GOAL #2   Title Patient to demonstrate only moderate limitation in thoracic spine in order to assist in improving mechanics/reducing pain    Time 3   Period Weeks   Status New     PT SHORT TERM GOAL #3   Title Patient to experience no more than 4/10 pain in order to improve QOL and improve functional task tolerance    Time 3   Period Weeks   Status New     PT SHORT TERM GOAL #4   Title Patient to demonstrate only moderate limitation in end-feel of PAs in lumbar and thoracic spine to evidence general improvement of condition    Time 3   Period Weeks   Status New     PT SHORT TERM GOAL #5   Title Patient to be participatory in correctly and consistently performing appropriate HEP, to be updated PRN    Time 1   Period Weeks   Status New           PT Long Term Goals -  04/30/16 1709      PT LONG TERM GOAL #1   Title Patient to demonstrate only mild mobility limitation in lumbar/thoracic spines and pelvis in order to assist in reducing pain and improving mechanics    Time 6   Period Weeks   Status New     PT LONG TERM GOAL #2   Title Patient to demonstrate correct functional mechanics for proper lifting technique in order to assist in preventing exacerbation of pain    Time 6   Period Weeks   Status New     PT LONG TERM GOAL #3   Title Patient to experience no more than 2/10 pain with all functional activities in order to improve QOL and improve functional task tolerance    Time 6   Period Weeks   Status New     PT LONG TERM GOAL #4   Title Patient to be participatory in regular aerobic physical activity program, at least 20 minutes in duration and at least 4 days per week in order to maintain functional gains and improve general health status    Time 6   Period Weeks   Status New               Plan - 05/15/16 1639    Clinical Impression Statement Pt continues to be limited by high pain scale on lower back Rt sided.  Session focus on improve spinal mobility to assist with pain.  DPT trial on lumbar spinal PA joint mobs grade 2, reports of radicular symptoms down Rt LE, upon report of symptom joint mobs stopped.  Added thoracic excursion to improve thoracic mobiltiy with therapist facilitation for proper form and technqiue and educated importance of proper posture.  EOS pain remains 7/10 lower back, no radicular symptoms.     Rehab Potential Good   Clinical Impairments Affecting Rehab Potential (+) good response/pain reduction to physical training in the past, (-) chronicity of pain, (+) high motivation to participate in skilled PT services    PT Frequency 2x / week   PT Duration 6 weeks   PT Treatment/Interventions ADLs/Self Care Home Management;Biofeedback;Cryotherapy;Moist Heat;DME Instruction;Gait training;Stair training;Functional  mobility training;Therapeutic activities;Therapeutic exercise;Balance training;Neuromuscular re-education;Patient/family education;Manual techniques;Energy conservation;Passive range of motion;Taping   PT Next Visit Plan hip and lumbar mobility, functional  stretches! Next session, focus on mobility of lumbar/thoracic spine/pelvis, proximal strength, core activation .   PT Home Exercise Plan lower trunk rotation x15 reps, bridge, ab set      Patient will benefit from skilled therapeutic intervention in order to improve the following deficits and impairments:  Abnormal gait, Pain, Improper body mechanics, Decreased coordination, Decreased mobility, Increased muscle spasms, Postural dysfunction, Decreased activity tolerance, Decreased range of motion, Decreased strength, Hypomobility, Decreased balance, Difficulty walking, Impaired flexibility  Visit Diagnosis: Chronic bilateral low back pain without sciatica  Muscle weakness (generalized)  Abnormal posture  Other symptoms and signs involving the musculoskeletal system     Problem List Patient Active Problem List   Diagnosis Date Noted  . Degenerative joint disease (DJD) of lumbar spine 03/24/2016  . Depression (emotion) 03/24/2016  . Obesity, unspecified 08/14/2015  . Essential hypertension, benign 04/29/2015  . Hyperlipidemia 04/29/2015   Ihor Austin, [place]; [place]  Aldona Lento 05/15/2016, 5:15 PM  [facility] [place], [place], [address] Phone: [phone]   Fax:  [phone]  Name: Donna Woodward MRN: [id number] Date of Birth: 1956/05/02

## 2016-05-19 ENCOUNTER — Ambulatory Visit (HOSPITAL_COMMUNITY): Payer: Medicare Other | Admitting: Physical Therapy

## 2016-05-20 ENCOUNTER — Encounter (HOSPITAL_COMMUNITY): Payer: Self-pay

## 2016-05-20 ENCOUNTER — Encounter (HOSPITAL_COMMUNITY)
Admission: RE | Admit: 2016-05-20 | Discharge: 2016-05-20 | Disposition: A | Discharge status: Home / Self Care | Payer: Medicare Other | Source: Ambulatory Visit | Attending: Cardiovascular Disease | Admitting: Cardiovascular Disease

## 2016-05-20 ENCOUNTER — Inpatient Hospital Stay (HOSPITAL_COMMUNITY): Admission: RE | Admit: 2016-05-20 | Payer: Medicare Other | Source: Ambulatory Visit

## 2016-05-20 DIAGNOSIS — R9439 Abnormal result of other cardiovascular function study: Secondary | ICD-10-CM | POA: Diagnosis not present

## 2016-05-20 DIAGNOSIS — R0789 Other chest pain: Secondary | ICD-10-CM

## 2016-05-20 LAB — NM MYOCAR MULTI W/SPECT W/WALL MOTION / EF
CHL CUP NUCLEAR SDS: 3
CHL CUP NUCLEAR SRS: 9
CHL CUP NUCLEAR SSS: 12
CHL CUP RESTING HR STRESS: 65 {beats}/min
LV dias vol: 77 mL (ref 46–106)
LV sys vol: 24 mL
NUC STRESS TID: 1.18
Peak HR: 80 {beats}/min
RATE: 0.33

## 2016-05-20 MED ORDER — REGADENOSON 0.4 MG/5ML IV SOLN
INTRAVENOUS | Status: AC
  Administered 2016-05-20: 0.4 mg via INTRAVENOUS
  Filled 2016-05-20: qty 5

## 2016-05-20 MED ORDER — TECHNETIUM TC 99M TETROFOSMIN IV KIT
10.0000 | PACK | Freq: Once | INTRAVENOUS | Status: AC | PRN
  Administered 2016-05-20: 10.4 via INTRAVENOUS

## 2016-05-20 MED ORDER — TECHNETIUM TC 99M TETROFOSMIN IV KIT
30.0000 | PACK | Freq: Once | INTRAVENOUS | Status: AC | PRN
  Administered 2016-05-20: 31.1 via INTRAVENOUS

## 2016-05-20 MED ORDER — SODIUM CHLORIDE 0.9% FLUSH
INTRAVENOUS | Status: AC
  Administered 2016-05-20: 10 mL via INTRAVENOUS
  Filled 2016-05-20: qty 10

## 2016-05-21 ENCOUNTER — Ambulatory Visit (HOSPITAL_COMMUNITY): Payer: Medicare Other

## 2016-05-21 ENCOUNTER — Telehealth: Payer: Self-pay

## 2016-05-21 DIAGNOSIS — M6281 Muscle weakness (generalized): Secondary | ICD-10-CM

## 2016-05-21 DIAGNOSIS — R29898 Other symptoms and signs involving the musculoskeletal system: Secondary | ICD-10-CM

## 2016-05-21 DIAGNOSIS — R293 Abnormal posture: Secondary | ICD-10-CM

## 2016-05-21 DIAGNOSIS — M545 Low back pain: Secondary | ICD-10-CM | POA: Diagnosis not present

## 2016-05-21 DIAGNOSIS — G8929 Other chronic pain: Secondary | ICD-10-CM

## 2016-05-21 NOTE — Therapy (Signed)
[facility] [address] [place], [place], [address] Phone: [phone]   Fax:  [phone]  Physical Therapy Treatment  Patient Details  Name: Donna Woodward MRN: [id number] Date of Birth: [birth year] Referring Provider: Raylene Everts   Encounter Date: 05/21/2016      PT End of Session - 05/21/16 1526    Visit Number 6   Number of Visits 12   Date for PT Re-Evaluation 05/21/16   Authorization Type Medicare    Authorization Time Period 04/30/16 06/11/16   Authorization - Visit Number 6   Authorization - Number of Visits 10   PT Start Time [id number]   PT Stop Time 1556   PT Time Calculation (min) 38 min   Activity Tolerance Patient tolerated treatment well   Behavior During Therapy [facility] for tasks assessed/performed      Past Medical History:  Diagnosis Date  . Anxiety   . Back pain   . Degenerative joint disease (DJD) of lumbar spine 03/24/2016  . Depression   . GERD (gastroesophageal reflux disease)    if overweight  . Hyperlipidemia   . Hypertension     Past Surgical History:  Procedure Laterality Date  . BACK SURGERY    . [place]   lamnectomy L5S1    There were no vitals filed for this visit.      Subjective Assessment - 05/21/16 1522    Subjective Pt stated she continues to be limited by high pain scale middle of lower back, soreness, throbbing and stiffness.     Pertinent History beta-blockers, HTN, history of laminectomy 1990   Patient Stated Goals reduce pain to a functional level   Currently in Pain? Yes   Pain Score 8    Pain Location Back   Pain Orientation Lower;Medial   Pain Descriptors / Indicators Aching;Throbbing;Sore   Pain Type Chronic pain   Pain Radiating Towards none   Pain Onset More than a month ago   Pain Frequency Constant   Aggravating Factors  being up for extended periods, extended work projects   Pain Relieving Factors ice   Effect of Pain on Daily Activities severe  limited on ADL            [facility] Adult PT Treatment/Exercise - 05/21/16 0001      Lumbar Exercises: Stretches   Double Knee to Chest Stretch 3 reps;30 seconds   Lower Trunk Rotation 5 reps;10 seconds   Quadruped Mid Back Stretch 5 reps;10 seconds   Quadruped Mid Back Stretch Limitations mad cat/ cow   Prone Mid Back Stretch 3 reps;30 seconds   Prone Mid Back Stretch Limitations Child's pose 3 direcitons   Quad Stretch 2 reps;30 seconds     Lumbar Exercises: Standing   Other Standing Lumbar Exercises 3D hip excursion 5x no squat     Lumbar Exercises: Seated   LAQ on Chair Limitations --  5x 3D thoracic excursion                  PT Short Term Goals - 04/30/16 1707      PT SHORT TERM GOAL #1   Title Patient to demonstrate only moderate limitation in lumbar spine/pelvis in order to assist in improving mechanics/reducing pain    Time 3   Period Weeks   Status New     PT SHORT TERM GOAL #2   Title Patient to demonstrate only moderate limitation in thoracic spine in order to assist in improving mechanics/reducing  pain    Time 3   Period Weeks   Status New     PT SHORT TERM GOAL #3   Title Patient to experience no more than 4/10 pain in order to improve QOL and improve functional task tolerance    Time 3   Period Weeks   Status New     PT SHORT TERM GOAL #4   Title Patient to demonstrate only moderate limitation in end-feel of PAs in lumbar and thoracic spine to evidence general improvement of condition    Time 3   Period Weeks   Status New     PT SHORT TERM GOAL #5   Title Patient to be participatory in correctly and consistently performing appropriate HEP, to be updated PRN    Time 1   Period Weeks   Status New           PT Long Term Goals - 04/30/16 1709      PT LONG TERM GOAL #1   Title Patient to demonstrate only mild mobility limitation in lumbar/thoracic spines and pelvis in order to assist in reducing pain and improving mechanics    Time 6    Period Weeks   Status New     PT LONG TERM GOAL #2   Title Patient to demonstrate correct functional mechanics for proper lifting technique in order to assist in preventing exacerbation of pain    Time 6   Period Weeks   Status New     PT LONG TERM GOAL #3   Title Patient to experience no more than 2/10 pain with all functional activities in order to improve QOL and improve functional task tolerance    Time 6   Period Weeks   Status New     PT LONG TERM GOAL #4   Title Patient to be participatory in regular aerobic physical activity program, at least 20 minutes in duration and at least 4 days per week in order to maintain functional gains and improve general health status    Time 6   Period Weeks   Status New               Plan - 05/21/16 1540    Clinical Impression Statement Pt continued to be limited by high pain scale on lower back.  Session focus on improving spinal mobility to assist with pain.  Therapist facilitation for proper form and technique with majority of exercises.  Added spinal mobiltiy exercises including 3D hip excursion (no frontal plane/squats), mad cat/cow and child's pose.  EOS pt reports pain reduced to 7/10.   Rehab Potential Good   Clinical Impairments Affecting Rehab Potential (+) good response/pain reduction to physical training in the past, (-) chronicity of pain, (+) high motivation to participate in skilled PT services    PT Frequency 2x / week   PT Duration 6 weeks   PT Treatment/Interventions ADLs/Self Care Home Management;Biofeedback;Cryotherapy;Moist Heat;DME Instruction;Gait training;Stair training;Functional mobility training;Therapeutic activities;Therapeutic exercise;Balance training;Neuromuscular re-education;Patient/family education;Manual techniques;Energy conservation;Passive range of motion;Taping   PT Next Visit Plan hip and lumbar mobility, functional stretches! Next session, focus on mobility of lumbar/thoracic spine/pelvis,  proximal strength, core activation .      Patient will benefit from skilled therapeutic intervention in order to improve the following deficits and impairments:  Abnormal gait, Pain, Improper body mechanics, Decreased coordination, Decreased mobility, Increased muscle spasms, Postural dysfunction, Decreased activity tolerance, Decreased range of motion, Decreased strength, Hypomobility, Decreased balance, Difficulty walking, Impaired flexibility  Visit Diagnosis: Chronic bilateral  low back pain without sciatica  Muscle weakness (generalized)  Abnormal posture  Other symptoms and signs involving the musculoskeletal system     Problem List Patient Active Problem List   Diagnosis Date Noted  . Degenerative joint disease (DJD) of lumbar spine 03/24/2016  . Depression (emotion) 03/24/2016  . Obesity, unspecified 08/14/2015  . Essential hypertension, benign 04/29/2015  . Hyperlipidemia 04/29/2015   Ihor Austin, [place]; [place]  Aldona Lento 05/21/2016, 3:57 PM  [facility] [place], [place], [address] Phone: [phone]   Fax:  [phone]  Name: Donna Woodward MRN: [id number] Date of Birth: 1955/08/01

## 2016-05-21 NOTE — Telephone Encounter (Signed)
-----   Message from Josue Hector, MD sent at 05/21/2016  8:53 AM EST ----- Abnormal myovue needs cath Have her see NP early next week and arrange cath next week with one of the interventionalists

## 2016-05-21 NOTE — Telephone Encounter (Signed)
Called pt, no answer. Left message for pt to return call. Made pt an appointment for 12/6 @ 1:40 to go over test results.

## 2016-05-27 ENCOUNTER — Ambulatory Visit (HOSPITAL_COMMUNITY): Payer: Medicare Other | Admitting: Physical Therapy

## 2016-05-27 DIAGNOSIS — M6281 Muscle weakness (generalized): Secondary | ICD-10-CM | POA: Diagnosis not present

## 2016-05-27 DIAGNOSIS — M545 Low back pain: Secondary | ICD-10-CM | POA: Diagnosis not present

## 2016-05-27 DIAGNOSIS — R293 Abnormal posture: Secondary | ICD-10-CM | POA: Diagnosis not present

## 2016-05-27 DIAGNOSIS — R29898 Other symptoms and signs involving the musculoskeletal system: Secondary | ICD-10-CM

## 2016-05-27 DIAGNOSIS — G8929 Other chronic pain: Secondary | ICD-10-CM | POA: Diagnosis not present

## 2016-05-27 NOTE — Therapy (Signed)
[place], [place], [address] Phone: [phone]   Fax:  [phone]  Physical Therapy Treatment (Discharge)  Patient Details  Name: Donna Woodward MRN: [id number] Date of Birth: 02/10/1956 Referring Provider: Raylene Everts   Encounter Date: 05/27/2016      PT End of Session - 05/27/16 1809    Visit Number 7   Number of Visits 7   Authorization Type Medicare    Authorization Time Period 04/30/16 06/11/16   Authorization - Visit Number 7   Authorization - Number of Visits 10   PT Start Time 4132   PT Stop Time 4401   PT Time Calculation (min) 31 min   Activity Tolerance Patient tolerated treatment well   Behavior During Therapy [facility] for tasks assessed/performed      Past Medical History:  Diagnosis Date  . Anxiety   . Back pain   . Degenerative joint disease (DJD) of lumbar spine 03/24/2016  . Depression   . GERD (gastroesophageal reflux disease)    if overweight  . Hyperlipidemia   . Hypertension     Past Surgical History:  Procedure Laterality Date  . BACK SURGERY    . [place]   lamnectomy L5S1    There were no vitals filed for this visit.      Subjective Assessment - 05/27/16 1605    Subjective Patient arrives today stating she is feeling "meh" in terms of her progress; she is in the process of moving, but her neighbors are doing the heavy lifting for her. She is however up more due to her move, and her back is bothering her because of this. Everything is hard for her, she has never noticed a major functional change. No falls or close calls.    Pertinent History beta-blockers, HTN, history of laminectomy 1990   How long can you sit comfortably? 11/28- immediate discomfort   How long can you stand comfortably? 11/28- 5 minutes    How long can you walk comfortably? 11/28- maybe 1/2 hour of grocery shopping on a good day    Diagnostic tests done in 2014, degenerative changes noted     Patient Stated Goals reduce pain to a functional level   Currently in Pain? Yes   Pain Score 7    Pain Location Back   Pain Orientation Lower;Right;Left   Pain Descriptors / Indicators Aching;Throbbing;Sore   Pain Type Chronic pain   Pain Radiating Towards none    Pain Onset More than a month ago   Pain Frequency Constant   Aggravating Factors  extended work projects, being up for extended periods    Pain Relieving Factors ice    Effect of Pain on Daily Activities severe limits on ADLs             [facility] PT Assessment - 05/27/16 0001      Observation/Other Assessments   Focus on Therapeutic Outcomes (FOTO)  52% limited      AROM   Lumbar Flexion severe limitation    Lumbar Extension severe limitation    Lumbar - Right Side Bend severe limitation    Lumbar - Left Side Bend severe limitation    Thoracic Flexion mild limitation    Thoracic Extension moderate limitation    Thoracic - Right Side Bend severe limitation    Thoracic - Left Side Bend severe limitation    Thoracic - Right Rotation min limitation    Thoracic - Left Rotation min limitation  Strength   Overall Strength Comments trunk strength 4-/5   Right Hip Flexion 3-/5   Right Hip ABduction 3/5   Left Hip Flexion 3-/5   Left Hip ABduction 3+/5   Right Knee Flexion 3+/5   Right Knee Extension 4-/5   Left Knee Flexion 4-/5   Left Knee Extension 4-/5   Right Ankle Dorsiflexion 3/5   Left Ankle Dorsiflexion 4/5     Ambulation/Gait   Gait Comments flexed at hips, rigidity ntoed in bilaterall hips and spine, possible scoliosis, antallgic gait                              PT Education - 05/27/16 1809    Education provided Yes   Education Details DC today due to lack of functional changes or pain reduction, return to MD    Person(s) Educated Patient   Methods Explanation   Comprehension Verbalized understanding          PT Short Term Goals - 05/27/16 1628      PT SHORT TERM  GOAL #1   Title Patient to demonstrate only moderate limitation in lumbar spine/pelvis in order to assist in improving mechanics/reducing pain    Baseline 11/28- hips have improved. lumbar spine has not    Time 3   Period Weeks   Status Partially Met     PT SHORT TERM GOAL #2   Title Patient to demonstrate only moderate limitation in thoracic spine in order to assist in improving mechanics/reducing pain    Time 3   Period Weeks   Status Partially Met     PT SHORT TERM GOAL #3   Title Patient to experience no more than 4/10 pain in order to improve QOL and improve functional task tolerance    Time 3   Period Weeks   Status Not Met     PT SHORT TERM GOAL #4   Title Patient to demonstrate only moderate limitation in end-feel of PAs in lumbar and thoracic spine to evidence general improvement of condition    Time 3   Period Weeks   Status Deferred     PT SHORT TERM GOAL #5   Title Patient to be participatory in correctly and consistently performing appropriate HEP, to be updated PRN    Baseline 11/28- compliant    Time 1   Period Weeks   Status Achieved           PT Long Term Goals - 05/27/16 1629      PT LONG TERM GOAL #1   Title Patient to demonstrate only mild mobility limitation in lumbar/thoracic spines and pelvis in order to assist in reducing pain and improving mechanics    Time 6   Period Weeks   Status Not Met     PT LONG TERM GOAL #2   Title Patient to demonstrate correct functional mechanics for proper lifting technique in order to assist in preventing exacerbation of pain    Time 6   Period Weeks   Status Deferred     PT LONG TERM GOAL #3   Title Patient to experience no more than 2/10 pain with all functional activities in order to improve QOL and improve functional task tolerance    Time 6   Period Weeks   Status Not Met     PT LONG TERM GOAL #4   Title Patient to be participatory in regular aerobic physical activity program, at least 20  minutes in  duration and at least 4 days per week in order to maintain functional gains and improve general health status    Time 6   Period Weeks   Status Not Met               Plan - Jun 15, 2016 1809    Clinical Impression Statement Re-assessment performed today. Patient arrives today stating she does not feel that she is making progress, she has not noticed any major functional changes. Upon assessment of all objective measures, DPT does not note any significant objective or functional changes besides minor functional strength improvement and mild improvement in thoracic mobility. Due to no significant changes after multiple weeks of skilled PT services, recommend DC and return to MD regarding her ongoing back pain.    Rehab Potential Good   Clinical Impairments Affecting Rehab Potential (+) good response/pain reduction to physical training in the past, (-) chronicity of pain, (+) high motivation to participate in skilled PT services    PT Frequency 2x / week   PT Duration 6 weeks   PT Treatment/Interventions ADLs/Self Care Home Management;Biofeedback;Cryotherapy;Moist Heat;DME Instruction;Gait training;Stair training;Functional mobility training;Therapeutic activities;Therapeutic exercise;Balance training;Neuromuscular re-education;Patient/family education;Manual techniques;Energy conservation;Passive range of motion;Taping   PT Next Visit Plan DC today    Consulted and Agree with Plan of Care Patient      Patient will benefit from skilled therapeutic intervention in order to improve the following deficits and impairments:  Abnormal gait, Pain, Improper body mechanics, Decreased coordination, Decreased mobility, Increased muscle spasms, Postural dysfunction, Decreased activity tolerance, Decreased range of motion, Decreased strength, Hypomobility, Decreased balance, Difficulty walking, Impaired flexibility  Visit Diagnosis: Chronic bilateral low back pain without sciatica  Muscle weakness  (generalized)  Abnormal posture  Other symptoms and signs involving the musculoskeletal system       G-Codes - 06/15/16 1810    Functional Assessment Tool Used Based on skilled clinical assessment of strength, ROM/mobility, posture, gait, pain patterns    Functional Limitation Mobility: Walking and moving around   Mobility: Walking and Moving Around Goal Status [phone]) At least 40 percent but less than 60 percent impaired, limited or restricted   Mobility: Walking and Moving Around Discharge Status [phone]) At least 40 percent but less than 60 percent impaired, limited or restricted      Problem List Patient Active Problem List   Diagnosis Date Noted  . Degenerative joint disease (DJD) of lumbar spine 03/24/2016  . Depression (emotion) 03/24/2016  . Obesity, unspecified 08/14/2015  . Essential hypertension, benign 04/29/2015  . Hyperlipidemia 04/29/2015   PHYSICAL THERAPY DISCHARGE SUMMARY  Visits from Start of Care: 7  Current functional level related to goals / functional outcomes: Patient has not made significant functional progress or experienced pain reduction with skilled PT services. Recommend DC and return to MD.    Remaining deficits: No major changes since initial eval with exception of some improved thoracic mobility and functional strength    Education / Equipment: DC today  Plan: Patient agrees to discharge.  Patient goals were not met. Patient is being discharged due to lack of progress.  ?????     Deniece Ree PT, DPT [facility] [address] [place], [place], [address] Phone: [phone]   Fax:  [phone]  Name: Donna Woodward MRN: [id number] Date of Birth: [birth year]

## 2016-05-29 ENCOUNTER — Ambulatory Visit (HOSPITAL_COMMUNITY): Payer: Medicare Other | Admitting: Physical Therapy

## 2016-06-03 ENCOUNTER — Encounter (HOSPITAL_COMMUNITY): Payer: Medicare Other

## 2016-06-04 ENCOUNTER — Other Ambulatory Visit (HOSPITAL_COMMUNITY)
Admission: RE | Admit: 2016-06-04 | Discharge: 2016-06-04 | Disposition: A | Discharge status: Home / Self Care | Payer: Medicare Other | Source: Ambulatory Visit | Attending: Physician Assistant | Admitting: Physician Assistant

## 2016-06-04 ENCOUNTER — Encounter: Payer: Self-pay | Admitting: *Deleted

## 2016-06-04 ENCOUNTER — Ambulatory Visit (INDEPENDENT_AMBULATORY_CARE_PROVIDER_SITE_OTHER): Payer: Medicare Other | Admitting: Physician Assistant

## 2016-06-04 ENCOUNTER — Encounter: Payer: Self-pay | Admitting: Physician Assistant

## 2016-06-04 ENCOUNTER — Ambulatory Visit (HOSPITAL_COMMUNITY)
Admission: RE | Admit: 2016-06-04 | Discharge: 2016-06-04 | Disposition: A | Discharge status: Home / Self Care | Payer: Medicare Other | Source: Ambulatory Visit | Attending: Physician Assistant | Admitting: Physician Assistant

## 2016-06-04 ENCOUNTER — Telehealth: Payer: Self-pay | Admitting: *Deleted

## 2016-06-04 VITALS — BP 98/58 | HR 71 | Ht 64.0 in | Wt 190.0 lb

## 2016-06-04 DIAGNOSIS — Z6832 Body mass index (BMI) 32.0-32.9, adult: Secondary | ICD-10-CM

## 2016-06-04 DIAGNOSIS — R079 Chest pain, unspecified: Secondary | ICD-10-CM

## 2016-06-04 DIAGNOSIS — Z01818 Encounter for other preprocedural examination: Secondary | ICD-10-CM | POA: Insufficient documentation

## 2016-06-04 DIAGNOSIS — R9439 Abnormal result of other cardiovascular function study: Secondary | ICD-10-CM | POA: Diagnosis not present

## 2016-06-04 DIAGNOSIS — Z8249 Family history of ischemic heart disease and other diseases of the circulatory system: Secondary | ICD-10-CM | POA: Insufficient documentation

## 2016-06-04 DIAGNOSIS — I10 Essential (primary) hypertension: Secondary | ICD-10-CM

## 2016-06-04 DIAGNOSIS — E669 Obesity, unspecified: Secondary | ICD-10-CM

## 2016-06-04 DIAGNOSIS — E785 Hyperlipidemia, unspecified: Secondary | ICD-10-CM | POA: Insufficient documentation

## 2016-06-04 LAB — CBC WITH DIFFERENTIAL/PLATELET
BASOS ABS: 0 10*3/uL (ref 0.0–0.1)
BASOS PCT: 0 %
EOS ABS: 0.2 10*3/uL (ref 0.0–0.7)
EOS PCT: 2 %
HEMATOCRIT: 41.3 % (ref 36.0–46.0)
Hemoglobin: 13.5 g/dL (ref 12.0–15.0)
Lymphocytes Relative: 21 %
Lymphs Abs: 2.4 10*3/uL (ref 0.7–4.0)
MCH: 28.8 pg (ref 26.0–34.0)
MCHC: 32.7 g/dL (ref 30.0–36.0)
MCV: 88.1 fL (ref 78.0–100.0)
MONO ABS: 0.6 10*3/uL (ref 0.1–1.0)
Monocytes Relative: 5 %
NEUTROS ABS: 8.1 10*3/uL — AB (ref 1.7–7.7)
Neutrophils Relative %: 72 %
PLATELETS: 246 10*3/uL (ref 150–400)
RBC: 4.69 MIL/uL (ref 3.87–5.11)
RDW: 14.9 % (ref 11.5–15.5)
WBC: 11.2 10*3/uL — ABNORMAL HIGH (ref 4.0–10.5)

## 2016-06-04 LAB — BASIC METABOLIC PANEL
ANION GAP: 8 (ref 5–15)
BUN: 18 mg/dL (ref 6–20)
CALCIUM: 9.2 mg/dL (ref 8.9–10.3)
CO2: 31 mmol/L (ref 22–32)
CREATININE: 0.96 mg/dL (ref 0.44–1.00)
Chloride: 95 mmol/L — ABNORMAL LOW (ref 101–111)
Glucose, Bld: 154 mg/dL — ABNORMAL HIGH (ref 65–99)
Potassium: 2.9 mmol/L — ABNORMAL LOW (ref 3.5–5.1)
SODIUM: 134 mmol/L — AB (ref 135–145)

## 2016-06-04 LAB — PROTIME-INR
INR: 0.97
PROTHROMBIN TIME: 12.9 s (ref 11.4–15.2)

## 2016-06-04 MED ORDER — ASPIRIN EC 81 MG PO TBEC
81.0000 mg | DELAYED_RELEASE_TABLET | Freq: Every day | ORAL | 3 refills | Status: DC
Start: 2016-06-04 — End: 2016-06-24

## 2016-06-04 MED ORDER — POTASSIUM CHLORIDE CRYS ER 20 MEQ PO TBCR: 20.0000 meq | EXTENDED_RELEASE_TABLET | Freq: Every day | ORAL | 3 refills | Status: DC

## 2016-06-04 NOTE — Patient Instructions (Addendum)
Your physician recommends that you schedule a follow-up appointment after catheterization.   Your physician has requested that you have a cardiac catheterization. Cardiac catheterization is used to diagnose and/or treat various heart conditions. Doctors may recommend this procedure for a number of different reasons. The most common reason is to evaluate chest pain. Chest pain can be a symptom of coronary artery disease (CAD), and cardiac catheterization can show whether plaque is narrowing or blocking your heart's arteries. This procedure is also used to evaluate the valves, as well as measure the blood flow and oxygen levels in different parts of your heart. For further information please visit HugeFiesta.tn. Please follow instruction sheet, as given.  Have Chest X-Ray done today   Your physician recommends that you have lab work in: Today   If you need a refill on your cardiac medications before your next appointment, please call your pharmacy.

## 2016-06-04 NOTE — Telephone Encounter (Signed)
Called patient with test results. No answer. Left message to call back.  

## 2016-06-04 NOTE — Telephone Encounter (Signed)
-----  Message from Imogene Burn, PA-C sent at 06/04/2016  4:07 PM EST ----- Potassium very low. Scheduled for cath tomorrow. Hold Zestoretic in am. Calling K Dur 20 mEq and have her take 4 tablets tonight. Order be met for tomorrow morning prior to cath at the hospital

## 2016-06-04 NOTE — Addendum Note (Signed)
Addended by: Imogene Burn on: 06/04/2016 04:09 PM   Modules accepted: Orders

## 2016-06-04 NOTE — Progress Notes (Signed)
Cardiology Office Note    Date:  06/04/2016   ID:  Donna Woodward, DOB [birth year], MRN [id number]  PCP:  Raylene Everts, MD  Cardiologist: Dr. Johnsie Cancel  No chief complaint on file.   History of Present Illness:  Donna Woodward is a 60 y.o. female  who saw Dr. Johnsie Cancel 05/07/16 for evaluation of chest pain. CRF;s HTN and elevated lipids compliant with meds Previous smoker. Also has strong family history of CAD.   Goes To PT for chronic back pain. 04/23/16 complained to primary when she walks briskly she feels a pressure sensation in the center for chest. No shortness of breath, no lightheadedness or diaphoresis. This sensation goes away with rest. She rates the pain as "moderate".  Last stress test normal over 10 years ago    Pain is primarily left shoulder trapezius and arm. Worse last 6 months There is an exertional component She is hesitant to see ortho as prior back surgery was not helpful but has bilateral shoulder issues as well now   Quit smoking in 2008 mild cough now.  Nuclear stress test 05/20/16 showed no diagnostic ST segment abnormalities, rare PVCs, moderate sized, moderate intensity, reversible inferolateral defect from the apex to the base with partial reversibility at the very base suggestive of ischemia. It was considered an intermediate risk study LVEF 69%. Dr. Johnsie Cancel recommended cardiac catheterization.  Patient denies any further chest tightness since she was here last. She is committed she has CAD because of all her cardiac risk factors, lifestyle and the left-sided chest tightness and her left arm that she had been having.     Past Medical History:  Diagnosis Date  . Anxiety   . Back pain   . Degenerative joint disease (DJD) of lumbar spine 03/24/2016  . Depression   . GERD (gastroesophageal reflux disease)    if overweight  . Hyperlipidemia   . Hypertension     Past Surgical History:  Procedure Laterality Date  . BACK SURGERY    . SPINE SURGERY  1990   lamnectomy L5S1    Current Medications: Outpatient Medications Prior to Visit  Medication Sig Dispense Refill  . amLODipine (NORVASC) 10 MG tablet Take 1 tablet (10 mg total) by mouth daily. 90 tablet 1  . lisinopril-hydrochlorothiazide (PRINZIDE,ZESTORETIC) 20-25 MG tablet Take 1 tablet by mouth daily. 90 tablet 1  . metoprolol succinate (TOPROL-XL) 50 MG 24 hr tablet Take 1 tablet (50 mg total) by mouth daily. Take with or immediately following a meal. 90 tablet 1  . omega-3 fish oil (MAXEPA) 1000 MG CAPS capsule Take 1 capsule (1,000 mg total) by mouth 2 (two) times daily. Reported on 11/14/2015 180 each 1  . simvastatin (ZOCOR) 20 MG tablet Take 1 tablet (20 mg total) by mouth at bedtime. 90 tablet 1   No facility-administered medications prior to visit.      Allergies:   Patient has no known allergies.   Social History   Social History  . Marital status: Single    Spouse name: N/A  . Number of children: N/A  . Years of education: 25   Occupational History  . disabled     warehouse   Social History Main Topics  . Smoking status: Former Smoker    Quit date: 06/30/2006  . Smokeless tobacco: Never Used  . Alcohol use No  . Drug use: No     Comment: hx marijuana  . Sexual activity: No   Other Topics Concern  . None  Social History Narrative   Masters in Designer, television/film set alone   Disabled with back pain     Family History:  The patient's   family history includes CAD (age of onset: 14) in her sister; Cancer (age of onset: 57) in her sister; Dementia in her father; Heart disease in her father; Hypertension in her mother, sister, sister, and sister; Stroke in her father.   ROS:   Please see the history of present illness.    Review of Systems  Cardiovascular: Positive for chest pain, dyspnea on exertion and leg swelling.  Respiratory: Positive for cough.   Musculoskeletal: Positive for back pain.   All other systems reviewed and are negative.   PHYSICAL  EXAM:   VS:  BP (!) 98/58   Pulse 71   Ht 5\' 4"  (1.626 m)   Wt 190 lb (86.2 kg)   SpO2 92%   BMI 32.61 kg/m   Physical Exam  GEN: Obese in no acute distress  HEENT: normal  Neck: no JVD, carotid bruits, or masses Cardiac:RRR; 1/6 systolic murmur at the left sternal border, no rubs, or gallops  Respiratory:  clear to auscultation bilaterally, normal work of breathing GI: soft, nontender, nondistended, + BS Ext: Trace of ankle edema bilaterally without cyanosis, clubbing,  Good distal pulses bilaterally MS: no deformity or atrophy  Skin: warm and dry, no rash Neuro:  Alert and Oriented x 3, Strength and sensation are intact Psych: euthymic mood, full affect  Wt Readings from Last 3 Encounters:  06/04/16 190 lb (86.2 kg)  05/07/16 189 lb (85.7 kg)  04/23/16 192 lb (87.1 kg)      Studies/Labs Reviewed:   EKG:  EKG is not ordered today.   Recent Labs: 11/06/2015: ALT 15; BUN 14; Creat 0.71; Potassium 3.6; Sodium 140   Lipid Panel    Component Value Date/Time   CHOL 171 11/06/2015 1126   TRIG 129 11/06/2015 1126   HDL 33 (L) 11/06/2015 1126   CHOLHDL 5.2 (H) 11/06/2015 1126   VLDL 26 11/06/2015 1126   LDLCALC 112 11/06/2015 1126    Additional studies/ records that were reviewed today include:   Nuclear stress test 05/20/16  No diagnostic ST segment abnormalities over baseline to indicate ischemia. Rare PVCs noted.  Moderate sized, moderate intensity, reversible inferolateral defect from apex to base with partial reversibility at the very base suggestive of ischemia. There is extracardiac radiotracer uptake near the inferior wall on both rest and stress imaging, however artifact does appear to explain this defect.  This is an intermediate risk study.  Nuclear stress EF: 69%.      ASSESSMENT:    1. Chest pain, unspecified type   2. Abnormal nuclear stress test   3. Essential hypertension, benign   4. Hyperlipidemia, unspecified hyperlipidemia type   5. Class 1  obesity with body mass index (BMI) of 32.0 to 32.9 in adult, unspecified obesity type, unspecified whether serious comorbidity present   6. Family history of early CAD      PLAN:  In order of problems listed above: Chest pain with abnormal nuclear stress test Moderate sized, moderate intensity, reversible inferolateral defect from apex to base with partial reversibility at the very base suggestive of ischemia. Reviewed results with the patient and recommend cardiac catheterization per Dr. Johnsie Cancel. Patient is agreeable. Again aspirin 81 mg once daily. I have reviewed the risks, indications, and alternatives to angioplasty and stenting with the patient. Risks include but are not limited to bleeding,  infection, vascular injury, stroke, myocardial infection, arrhythmia, kidney injury, radiation-related injury in the case of prolonged fluoroscopy use, emergency cardiac surgery, and death. The patient understands the risks of serious complication is low (123456) and he agrees to proceed.   Hypertension blood pressure is elevated today. Patient is having a lot of back pain and is anxious about this visit. Will not make changes today.  Hyperlipidemia continue Zocor  Obesity weight loss recommended  Family history of early CAD     Medication Adjustments/Labs and Tests Ordered: Current medicines are reviewed at length with the patient today.  Concerns regarding medicines are outlined above.  Medication changes, Labs and Tests ordered today are listed in the Patient Instructions below. Patient Instructions  Your physician recommends that you schedule a follow-up appointment after catheterization.   Your physician has requested that you have a cardiac catheterization. Cardiac catheterization is used to diagnose and/or treat various heart conditions. Doctors may recommend this procedure for a number of different reasons. The most common reason is to evaluate chest pain. Chest pain can be a symptom of  coronary artery disease (CAD), and cardiac catheterization can show whether plaque is narrowing or blocking your heart's arteries. This procedure is also used to evaluate the valves, as well as measure the blood flow and oxygen levels in different parts of your heart. For further information please visit HugeFiesta.tn. Please follow instruction sheet, as given.  Have Chest X-Ray done today   Your physician recommends that you have lab work in: Today   If you need a refill on your cardiac medications before your next appointment, please call your pharmacy.      Sumner Boast, PA-C  06/04/2016 2:04 PM    [facility] [place], [place], [place]  [address] Phone: [phone]; Fax: [phone]

## 2016-06-05 ENCOUNTER — Encounter (HOSPITAL_COMMUNITY): Payer: Medicare Other | Admitting: Physical Therapy

## 2016-06-05 ENCOUNTER — Ambulatory Visit (HOSPITAL_COMMUNITY)
Admission: RE | Admit: 2016-06-05 | Discharge: 2016-06-05 | Disposition: A | Discharge status: Home / Self Care | Payer: Medicare Other | Source: Ambulatory Visit | Attending: Interventional Cardiology | Admitting: Interventional Cardiology

## 2016-06-05 ENCOUNTER — Encounter (HOSPITAL_COMMUNITY)
Admission: RE | Disposition: A | Discharge status: Home / Self Care | Payer: Self-pay | Source: Ambulatory Visit | Attending: Interventional Cardiology

## 2016-06-05 ENCOUNTER — Encounter (HOSPITAL_COMMUNITY): Payer: Self-pay | Admitting: Interventional Cardiology

## 2016-06-05 DIAGNOSIS — I2510 Atherosclerotic heart disease of native coronary artery without angina pectoris: Secondary | ICD-10-CM | POA: Diagnosis present

## 2016-06-05 DIAGNOSIS — I25118 Atherosclerotic heart disease of native coronary artery with other forms of angina pectoris: Secondary | ICD-10-CM | POA: Diagnosis not present

## 2016-06-05 DIAGNOSIS — R9439 Abnormal result of other cardiovascular function study: Secondary | ICD-10-CM

## 2016-06-05 DIAGNOSIS — E785 Hyperlipidemia, unspecified: Secondary | ICD-10-CM | POA: Diagnosis not present

## 2016-06-05 DIAGNOSIS — I129 Hypertensive chronic kidney disease with stage 1 through stage 4 chronic kidney disease, or unspecified chronic kidney disease: Secondary | ICD-10-CM | POA: Insufficient documentation

## 2016-06-05 DIAGNOSIS — I10 Essential (primary) hypertension: Secondary | ICD-10-CM | POA: Diagnosis present

## 2016-06-05 DIAGNOSIS — Z7982 Long term (current) use of aspirin: Secondary | ICD-10-CM | POA: Insufficient documentation

## 2016-06-05 DIAGNOSIS — M549 Dorsalgia, unspecified: Secondary | ICD-10-CM | POA: Insufficient documentation

## 2016-06-05 DIAGNOSIS — R079 Chest pain, unspecified: Secondary | ICD-10-CM

## 2016-06-05 DIAGNOSIS — Z6831 Body mass index (BMI) 31.0-31.9, adult: Secondary | ICD-10-CM | POA: Diagnosis not present

## 2016-06-05 DIAGNOSIS — K219 Gastro-esophageal reflux disease without esophagitis: Secondary | ICD-10-CM | POA: Diagnosis not present

## 2016-06-05 DIAGNOSIS — G8929 Other chronic pain: Secondary | ICD-10-CM | POA: Diagnosis not present

## 2016-06-05 DIAGNOSIS — Z87891 Personal history of nicotine dependence: Secondary | ICD-10-CM | POA: Diagnosis not present

## 2016-06-05 DIAGNOSIS — E669 Obesity, unspecified: Secondary | ICD-10-CM | POA: Diagnosis not present

## 2016-06-05 DIAGNOSIS — Z8249 Family history of ischemic heart disease and other diseases of the circulatory system: Secondary | ICD-10-CM | POA: Insufficient documentation

## 2016-06-05 HISTORY — PX: CARDIAC CATHETERIZATION: SHX172

## 2016-06-05 LAB — BASIC METABOLIC PANEL
Anion gap: 9 (ref 5–15)
BUN: 12 mg/dL (ref 6–20)
CO2: 27 mmol/L (ref 22–32)
Calcium: 8.9 mg/dL (ref 8.9–10.3)
Chloride: 105 mmol/L (ref 101–111)
Creatinine, Ser: 0.62 mg/dL (ref 0.44–1.00)
GFR calc Af Amer: >60 mL/min (ref >60)
GFR calc non Af Amer: >60 mL/min (ref >60)
Glucose, Bld: 133 mg/dL — ABNORMAL HIGH (ref 65–99)
Potassium: 3.5 mmol/L (ref 3.5–5.1)
Sodium: 141 mmol/L (ref 135–145)

## 2016-06-05 SURGERY — LEFT HEART CATH AND CORONARY ANGIOGRAPHY
Anesthesia: LOCAL

## 2016-06-05 MED ORDER — IOPAMIDOL (ISOVUE-370) INJECTION 76%
INTRAVENOUS | Status: AC
  Filled 2016-06-05: qty 100

## 2016-06-05 MED ORDER — VERAPAMIL HCL 2.5 MG/ML IV SOLN
INTRAVENOUS | Status: AC
  Filled 2016-06-05: qty 2

## 2016-06-05 MED ORDER — SODIUM CHLORIDE 0.9% FLUSH: 3.0000 mL | INTRAVENOUS | Status: DC | PRN

## 2016-06-05 MED ORDER — SODIUM CHLORIDE 0.9 % WEIGHT BASED INFUSION: 1.0000 mL/kg/h | INTRAVENOUS | Status: DC

## 2016-06-05 MED ORDER — SODIUM CHLORIDE 0.9 % IV SOLN
250.0000 mL | INTRAVENOUS | Status: DC | PRN
Start: 2016-06-05 — End: 2016-06-05

## 2016-06-05 MED ORDER — HEPARIN (PORCINE) IN NACL 2-0.9 UNIT/ML-% IJ SOLN
INTRAMUSCULAR | Status: DC | PRN
  Administered 2016-06-05: 1000 mL

## 2016-06-05 MED ORDER — SODIUM CHLORIDE 0.9 % IV SOLN: 250.0000 mL | INTRAVENOUS | Status: DC | PRN

## 2016-06-05 MED ORDER — HEPARIN (PORCINE) IN NACL 2-0.9 UNIT/ML-% IJ SOLN
INTRAMUSCULAR | Status: AC
  Filled 2016-06-05: qty 1000

## 2016-06-05 MED ORDER — ONDANSETRON HCL 4 MG/2ML IJ SOLN: 4.0000 mg | Freq: Four times a day (QID) | INTRAMUSCULAR | Status: DC | PRN

## 2016-06-05 MED ORDER — FENTANYL CITRATE (PF) 100 MCG/2ML IJ SOLN
INTRAMUSCULAR | Status: AC
  Filled 2016-06-05: qty 2

## 2016-06-05 MED ORDER — ACETAMINOPHEN 325 MG PO TABS: 650.0000 mg | ORAL_TABLET | ORAL | Status: DC | PRN

## 2016-06-05 MED ORDER — ASPIRIN 81 MG PO CHEW
81.0000 mg | CHEWABLE_TABLET | ORAL | Status: AC
  Administered 2016-06-05: 81 mg via ORAL

## 2016-06-05 MED ORDER — LIDOCAINE HCL (PF) 1 % IJ SOLN
INTRAMUSCULAR | Status: DC | PRN
  Administered 2016-06-05: 2 mL via INTRADERMAL

## 2016-06-05 MED ORDER — HEPARIN SODIUM (PORCINE) 1000 UNIT/ML IJ SOLN
INTRAMUSCULAR | Status: DC | PRN
  Administered 2016-06-05: 4500 [IU] via INTRAVENOUS

## 2016-06-05 MED ORDER — OXYCODONE-ACETAMINOPHEN 5-325 MG PO TABS: 1.0000 | ORAL_TABLET | ORAL | Status: DC | PRN

## 2016-06-05 MED ORDER — SODIUM CHLORIDE 0.9% FLUSH: 3.0000 mL | Freq: Two times a day (BID) | INTRAVENOUS | Status: DC

## 2016-06-05 MED ORDER — FENTANYL CITRATE (PF) 100 MCG/2ML IJ SOLN
INTRAMUSCULAR | Status: DC | PRN
  Administered 2016-06-05: 50 ug via INTRAVENOUS

## 2016-06-05 MED ORDER — VERAPAMIL HCL 2.5 MG/ML IV SOLN
INTRAVENOUS | Status: DC | PRN
  Administered 2016-06-05: 10 mL via INTRA_ARTERIAL

## 2016-06-05 MED ORDER — ASPIRIN 81 MG PO CHEW: 81.0000 mg | CHEWABLE_TABLET | Freq: Every day | ORAL | Status: DC

## 2016-06-05 MED ORDER — MIDAZOLAM HCL 2 MG/2ML IJ SOLN
INTRAMUSCULAR | Status: DC | PRN
  Administered 2016-06-05 (×2): 1 mg via INTRAVENOUS

## 2016-06-05 MED ORDER — IOPAMIDOL (ISOVUE-370) INJECTION 76%
INTRAVENOUS | Status: AC
  Filled 2016-06-05: qty 50

## 2016-06-05 MED ORDER — LIDOCAINE HCL (PF) 1 % IJ SOLN
INTRAMUSCULAR | Status: AC
  Filled 2016-06-05: qty 30

## 2016-06-05 MED ORDER — SODIUM CHLORIDE 0.9 % IV SOLN: INTRAVENOUS | Status: DC

## 2016-06-05 MED ORDER — ASPIRIN 81 MG PO CHEW
CHEWABLE_TABLET | ORAL | Status: AC
  Filled 2016-06-05: qty 1

## 2016-06-05 MED ORDER — SODIUM CHLORIDE 0.9 % WEIGHT BASED INFUSION
3.0000 mL/kg/h | INTRAVENOUS | Status: DC
  Administered 2016-06-05: 3 mL/kg/h via INTRAVENOUS

## 2016-06-05 MED ORDER — MIDAZOLAM HCL 2 MG/2ML IJ SOLN
INTRAMUSCULAR | Status: AC
  Filled 2016-06-05: qty 2

## 2016-06-05 MED ORDER — IOPAMIDOL (ISOVUE-370) INJECTION 76%
INTRAVENOUS | Status: DC | PRN
  Administered 2016-06-05: 110 mL via INTRA_ARTERIAL

## 2016-06-05 SURGICAL SUPPLY — 10 items
CATH EXPO 5F FL3.5 (CATHETERS) ×2 IMPLANT
CATH INFINITI JR4 5F (CATHETERS) ×2 IMPLANT
DEVICE RAD COMP TR BAND LRG (VASCULAR PRODUCTS) ×2 IMPLANT
GLIDESHEATH SLEND A-KIT 6F 22G (SHEATH) ×2 IMPLANT
GUIDEWIRE INQWIRE 1.5J.035X260 (WIRE) ×1 IMPLANT
INQWIRE 1.5J .035X260CM (WIRE) ×2
KIT HEART LEFT (KITS) ×2 IMPLANT
PACK CARDIAC CATHETERIZATION (CUSTOM PROCEDURE TRAY) ×2 IMPLANT
TRANSDUCER W/STOPCOCK (MISCELLANEOUS) ×2 IMPLANT
TUBING CIL FLEX 10 FLL-RA (TUBING) ×2 IMPLANT

## 2016-06-05 NOTE — Interval H&P Note (Signed)
History and Physical Interval Note:  06/05/2016 7:07 AM  Donna Woodward  has presented today for surgery, with the diagnosis of Abnormal stress test  The various methods of treatment have been discussed with the patient and family. After consideration of risks, benefits and other options for treatment, the patient has consented to  Procedure(s): Left Heart Cath and Coronary Angiography (N/A) as a surgical intervention .  The patient's history has been reviewed, patient examined, no change in status, stable for surgery.  I have reviewed the patient's chart and labs.  Questions were answered to the patient's satisfaction.     [name]

## 2016-06-05 NOTE — Interval H&P Note (Signed)
Cath Lab Visit (complete for each Cath Lab visit)  Clinical Evaluation Leading to the Procedure:   ACS: No.  Non-ACS:    Anginal Classification: CCS Woodward  Anti-ischemic medical therapy: Minimal Therapy (1 class of medications)  Non-Invasive Test Results: Intermediate-risk stress test findings: cardiac mortality 1-3%/year  Prior CABG: No previous CABG      History and Physical Interval Note:  06/05/2016 7:07 AM  Donna Woodward  has presented today for surgery, with the diagnosis of Abnormal stress test  The various methods of treatment have been discussed with the patient and family. After consideration of risks, benefits and other options for treatment, the patient has consented to  Procedure(s): Left Heart Cath and Coronary Angiography (N/A) as a surgical intervention .  The patient's history has been reviewed, patient examined, no change in status, stable for surgery.  I have reviewed the patient's chart and labs.  Questions were answered to the patient's satisfaction.     Donna Woodward

## 2016-06-05 NOTE — Research (Signed)
CADLAD Informed Consent   Subject Name: Donna Woodward  Subject met inclusion and exclusion criteria.  The informed consent form, study requirements and expectations were reviewed with the subject and questions and concerns were addressed prior to the signing of the consent form.  The subject verbalized understanding of the trail requirements.  The subject agreed to participate in the CADLAD trial and signed the informed consent.  The informed consent was obtained prior to performance of any protocol-specific procedures for the subject.  A copy of the signed informed consent was given to the subject and a copy was placed in the subject's medical record.  Hedrick,Tammy W 06/05/2016, 0710  

## 2016-06-05 NOTE — CV Procedure (Signed)
   Severe three-vessel coronary disease, diffuse and calcific.  Normal LV function.  TCTS consultation for multivessel CABG.

## 2016-06-05 NOTE — Discharge Instructions (Signed)

## 2016-06-05 NOTE — H&P (View-Only) (Signed)
Cardiology Office Note    Date:  06/04/2016   ID:  Donna Woodward, DOB [birth year], MRN [id number]  PCP:  Raylene Everts, MD  Cardiologist: Dr. Johnsie Cancel  No chief complaint on file.   History of Present Illness:  Donna Woodward is a 60 y.o. female  who saw Dr. Johnsie Cancel 05/07/16 for evaluation of chest pain. CRF;s HTN and elevated lipids compliant with meds Previous smoker. Also has strong family history of CAD.   Goes To PT for chronic back pain. 04/23/16 complained to primary when she walks briskly she feels a pressure sensation in the center for chest. No shortness of breath, no lightheadedness or diaphoresis. This sensation goes away with rest. She rates the pain as "moderate".  Last stress test normal over 10 years ago    Pain is primarily left shoulder trapezius and arm. Worse last 6 months There is an exertional component She is hesitant to see ortho as prior back surgery was not helpful but has bilateral shoulder issues as well now   Quit smoking in 2008 mild cough now.  Nuclear stress test 05/20/16 showed no diagnostic ST segment abnormalities, rare PVCs, moderate sized, moderate intensity, reversible inferolateral defect from the apex to the base with partial reversibility at the very base suggestive of ischemia. It was considered an intermediate risk study LVEF 69%. Dr. Johnsie Cancel recommended cardiac catheterization.  Patient denies any further chest tightness since she was here last. She is committed she has CAD because of all her cardiac risk factors, lifestyle and the left-sided chest tightness and her left arm that she had been having.     Past Medical History:  Diagnosis Date  . Anxiety   . Back pain   . Degenerative joint disease (DJD) of lumbar spine 03/24/2016  . Depression   . GERD (gastroesophageal reflux disease)    if overweight  . Hyperlipidemia   . Hypertension     Past Surgical History:  Procedure Laterality Date  . BACK SURGERY    . SPINE SURGERY  1990   lamnectomy L5S1    Current Medications: Outpatient Medications Prior to Visit  Medication Sig Dispense Refill  . amLODipine (NORVASC) 10 MG tablet Take 1 tablet (10 mg total) by mouth daily. 90 tablet 1  . lisinopril-hydrochlorothiazide (PRINZIDE,ZESTORETIC) 20-25 MG tablet Take 1 tablet by mouth daily. 90 tablet 1  . metoprolol succinate (TOPROL-XL) 50 MG 24 hr tablet Take 1 tablet (50 mg total) by mouth daily. Take with or immediately following a meal. 90 tablet 1  . omega-3 fish oil (MAXEPA) 1000 MG CAPS capsule Take 1 capsule (1,000 mg total) by mouth 2 (two) times daily. Reported on 11/14/2015 180 each 1  . simvastatin (ZOCOR) 20 MG tablet Take 1 tablet (20 mg total) by mouth at bedtime. 90 tablet 1   No facility-administered medications prior to visit.      Allergies:   Patient has no known allergies.   Social History   Social History  . Marital status: Single    Spouse name: N/A  . Number of children: N/A  . Years of education: 76   Occupational History  . disabled     warehouse   Social History Main Topics  . Smoking status: Former Smoker    Quit date: 06/30/2006  . Smokeless tobacco: Never Used  . Alcohol use No  . Drug use: No     Comment: hx marijuana  . Sexual activity: No   Other Topics Concern  . None  Social History Narrative   Masters in Designer, television/film set alone   Disabled with back pain     Family History:  The patient's   family history includes CAD (age of onset: 84) in her sister; Cancer (age of onset: 72) in her sister; Dementia in her father; Heart disease in her father; Hypertension in her mother, sister, sister, and sister; Stroke in her father.   ROS:   Please see the history of present illness.    Review of Systems  Cardiovascular: Positive for chest pain, dyspnea on exertion and leg swelling.  Respiratory: Positive for cough.   Musculoskeletal: Positive for back pain.   All other systems reviewed and are negative.   PHYSICAL  EXAM:   VS:  BP (!) 98/58   Pulse 71   Ht 5\' 4"  (1.626 m)   Wt 190 lb (86.2 kg)   SpO2 92%   BMI 32.61 kg/m   Physical Exam  GEN: Obese in no acute distress  HEENT: normal  Neck: no JVD, carotid bruits, or masses Cardiac:RRR; 1/6 systolic murmur at the left sternal border, no rubs, or gallops  Respiratory:  clear to auscultation bilaterally, normal work of breathing GI: soft, nontender, nondistended, + BS Ext: Trace of ankle edema bilaterally without cyanosis, clubbing,  Good distal pulses bilaterally MS: no deformity or atrophy  Skin: warm and dry, no rash Neuro:  Alert and Oriented x 3, Strength and sensation are intact Psych: euthymic mood, full affect  Wt Readings from Last 3 Encounters:  06/04/16 190 lb (86.2 kg)  05/07/16 189 lb (85.7 kg)  04/23/16 192 lb (87.1 kg)      Studies/Labs Reviewed:   EKG:  EKG is not ordered today.   Recent Labs: 11/06/2015: ALT 15; BUN 14; Creat 0.71; Potassium 3.6; Sodium 140   Lipid Panel    Component Value Date/Time   CHOL 171 11/06/2015 1126   TRIG 129 11/06/2015 1126   HDL 33 (L) 11/06/2015 1126   CHOLHDL 5.2 (H) 11/06/2015 1126   VLDL 26 11/06/2015 1126   LDLCALC 112 11/06/2015 1126    Additional studies/ records that were reviewed today include:   Nuclear stress test 05/20/16  No diagnostic ST segment abnormalities over baseline to indicate ischemia. Rare PVCs noted.  Moderate sized, moderate intensity, reversible inferolateral defect from apex to base with partial reversibility at the very base suggestive of ischemia. There is extracardiac radiotracer uptake near the inferior wall on both rest and stress imaging, however artifact does appear to explain this defect.  This is an intermediate risk study.  Nuclear stress EF: 69%.      ASSESSMENT:    1. Chest pain, unspecified type   2. Abnormal nuclear stress test   3. Essential hypertension, benign   4. Hyperlipidemia, unspecified hyperlipidemia type   5. Class 1  obesity with body mass index (BMI) of 32.0 to 32.9 in adult, unspecified obesity type, unspecified whether serious comorbidity present   6. Family history of early CAD      PLAN:  In order of problems listed above: Chest pain with abnormal nuclear stress test Moderate sized, moderate intensity, reversible inferolateral defect from apex to base with partial reversibility at the very base suggestive of ischemia. Reviewed results with the patient and recommend cardiac catheterization per Dr. Johnsie Cancel. Patient is agreeable. Again aspirin 81 mg once daily. I have reviewed the risks, indications, and alternatives to angioplasty and stenting with the patient. Risks include but are not limited to bleeding,  infection, vascular injury, stroke, myocardial infection, arrhythmia, kidney injury, radiation-related injury in the case of prolonged fluoroscopy use, emergency cardiac surgery, and death. The patient understands the risks of serious complication is low (123456) and he agrees to proceed.   Hypertension blood pressure is elevated today. Patient is having a lot of back pain and is anxious about this visit. Will not make changes today.  Hyperlipidemia continue Zocor  Obesity weight loss recommended  Family history of early CAD     Medication Adjustments/Labs and Tests Ordered: Current medicines are reviewed at length with the patient today.  Concerns regarding medicines are outlined above.  Medication changes, Labs and Tests ordered today are listed in the Patient Instructions below. Patient Instructions  Your physician recommends that you schedule a follow-up appointment after catheterization.   Your physician has requested that you have a cardiac catheterization. Cardiac catheterization is used to diagnose and/or treat various heart conditions. Doctors may recommend this procedure for a number of different reasons. The most common reason is to evaluate chest pain. Chest pain can be a symptom of  coronary artery disease (CAD), and cardiac catheterization can show whether plaque is narrowing or blocking your heart's arteries. This procedure is also used to evaluate the valves, as well as measure the blood flow and oxygen levels in different parts of your heart. For further information please visit [facility]. Please follow instruction sheet, as given.  Have Chest X-Ray done today   Your physician recommends that you have lab work in: Today   If you need a refill on your cardiac medications before your next appointment, please call your pharmacy.      Sumner Boast, PA-C  06/04/2016 2:04 PM    [facility] [address], [place], [place]  [address] Phone: [phone]; Fax: [phone]

## 2016-06-12 ENCOUNTER — Encounter: Payer: Medicare Other | Admitting: Cardiothoracic Surgery

## 2016-06-12 ENCOUNTER — Telehealth: Payer: Self-pay | Admitting: Adult Health

## 2016-06-12 NOTE — Telephone Encounter (Signed)
Pt would like to know if she needs to keep her apt w/ KL on the 19th since she's seeing Dr. Nils Pyle on the 1`5th

## 2016-06-13 ENCOUNTER — Encounter: Payer: Self-pay | Admitting: Cardiothoracic Surgery

## 2016-06-13 ENCOUNTER — Institutional Professional Consult (permissible substitution) (INDEPENDENT_AMBULATORY_CARE_PROVIDER_SITE_OTHER): Payer: Medicare Other | Admitting: Cardiothoracic Surgery

## 2016-06-13 ENCOUNTER — Other Ambulatory Visit: Payer: Self-pay | Admitting: *Deleted

## 2016-06-13 VITALS — BP 125/79 | HR 55 | Resp 20 | Ht 65.0 in | Wt 190.0 lb

## 2016-06-13 DIAGNOSIS — I2510 Atherosclerotic heart disease of native coronary artery without angina pectoris: Secondary | ICD-10-CM

## 2016-06-13 NOTE — Telephone Encounter (Signed)
-----   Message from Lendon Colonel, NP sent at 06/12/2016  5:30 PM EST ----- Regarding: RE: Need to c u ? No. Should follow up after surgery ----- Message ----- From: Bernita Raisin, RN Sent: 06/12/2016   2:28 PM To: Lendon Colonel, NP Subject: Need to c u ?                                  Pt called, she has consult with [facility] for CABG,does she still need to c u post heart cath ?

## 2016-06-13 NOTE — Telephone Encounter (Signed)
Pt will fu after CABG

## 2016-06-13 NOTE — Progress Notes (Signed)
PCP is Raylene Everts, MD Referring Provider is Belva Crome, MD  Chief Complaint  Patient presents with  . Coronary Artery Disease    Surgical eval, Cadiac Cath 06/05/16   Patient examined, cardiac catheterization coronary angiograms personally reviewed and counseled with patient  HPI: 60 year old Caucasian female reformed smoker with hypertension, hyperlipidemia and positive family history of CAD presents for evaluation for CABG. The patient was having symptoms of exertional chest discomfort. She underwent a stress test which was positive for ischemia. She underwent cardiac catheterization via right radial artery a week ago by Dr. Tamala Julian. This demonstrated a 50% left main stenosis with severe three-vessel CAD including 95% RCA stenosis, occlusion of the PDA, 80% stenosis of the LAD, 85% stenosis of the diagonal and 70% stenosis of an OM branch of the circumflex. LVEDP was normal. EF is well preserved with some nferior wall hypokinesia. The patient was felt to be a candidate for surgical coronary revascularization based on her symptoms and coronary anatomy.  The patient denies previous cardiac problems. She has always been active. Currently she has had a decrease in activity due to low back pain. She previously had a lumbar laminectomy which he tolerated without any difficulties concerning surgery or anesthesia. She stopped smoking almost 10 years ago and is not a diabetic.  Past Medical History:  Diagnosis Date  . Anxiety   . Back pain   . Degenerative joint disease (DJD) of lumbar spine 03/24/2016  . Depression   . GERD (gastroesophageal reflux disease)    if overweight  . Hyperlipidemia   . Hypertension     Past Surgical History:  Procedure Laterality Date  . BACK SURGERY    . CARDIAC CATHETERIZATION N/A 06/05/2016   Procedure: Left Heart Cath and Coronary Angiography;  Surgeon: Belva Crome, MD;  Location: [facility];  Service: Cardiovascular;  Laterality: N/A;  . SPINE  SURGERY  1990   lamnectomy L5S1    Family History  Problem Relation Age of Onset  . Hypertension Mother   . Stroke Father   . Heart disease Father   . Dementia Father   . Hypertension Sister   . CAD Sister 47    cabg  . Hypertension Sister   . Cancer Sister 67    rectal  . Hypertension Sister     Social History Social History  Substance Use Topics  . Smoking status: Former Smoker    Quit date: 06/30/2006  . Smokeless tobacco: Never Used  . Alcohol use No    Current Outpatient Prescriptions  Medication Sig Dispense Refill  . amLODipine (NORVASC) 10 MG tablet Take 1 tablet (10 mg total) by mouth daily. 90 tablet 1  . aspirin EC 81 MG tablet Take 1 tablet (81 mg total) by mouth daily. 90 tablet 3  . lisinopril-hydrochlorothiazide (PRINZIDE,ZESTORETIC) 20-25 MG tablet Take 1 tablet by mouth daily. 90 tablet 1  . metoprolol succinate (TOPROL-XL) 50 MG 24 hr tablet Take 1 tablet (50 mg total) by mouth daily. Take with or immediately following a meal. (Patient taking differently: Take 50 mg by mouth at bedtime. Take with or immediately following a meal. ) 90 tablet 1  . omega-3 fish oil (MAXEPA) 1000 MG CAPS capsule Take 1 capsule (1,000 mg total) by mouth 2 (two) times daily. Reported on 11/14/2015 180 each 1  . potassium chloride SA (K-DUR,KLOR-CON) 20 MEQ tablet Take 1 tablet (20 mEq total) by mouth daily. 90 tablet 3  . simvastatin (ZOCOR) 20 MG tablet  Take 1 tablet (20 mg total) by mouth at bedtime. 90 tablet 1   No current facility-administered medications for this visit.     No Known Allergies  Review of Systems        Review of Systems :  [ y ] = yes, [  ] = no        General :  Weight gain [   ]    Weight loss  [   ]  Fatigue [  ]  Fever [  ]  Chills  [  ]                                Weakness  [  ]           HEENT    Headache [  ]  Dizziness [  ]  Blurred vision [  ] Glaucoma  [  ]                          Nosebleeds [  ] Painful or loose teeth [yes chipped  left-sided tooth  ]        Cardiac :  Chest pain/ pressure [  yes]  Resting SOB [  ] exertional SOB [mild ]                        Orthopnea [  ]  Pedal edema  [  ]  Palpitations [  ] Syncope/presyncope [ ]                         Paroxysmal nocturnal dyspnea [  ]         Pulmonary : cough [ intermittent ]  wheezing [  ]  Hemoptysis [  ] Sputum [  ] Snoring [  ]                              Pneumothorax [  ]  Sleep apnea [  ]        GI : Vomiting [  ]  Dysphagia [  ]  Melena  [  ]  Abdominal pain [  ] BRBPR [  ]              Heart burn [  ]  Constipation [  ] Diarrhea  [  ] Colonoscopy [   ]        GU : Hematuria [  ]  Dysuria [  ]  Nocturia [  ] UTI's [  ]        Vascular : Claudication [  ]  Rest pain [  ]  DVT [  ] Vein stripping [  ] leg ulcers [  ]spider veins on each leg asymptomatic                          TIA [  ] Stroke [  ]  Varicose veins [  ]        NEURO :  Headaches  [  ] Seizures [  ] Vision changes [  ] Paresthesias [  ]      right-hand-dominant  Seizures [  ]        Musculoskeletal :  Arthritis [place].Blacker  ] Gout  [  ]  Back pain [ yes fairly severe ]  Joint pain [  ]        Skin :  Rash [  ]  Melanoma [  ] Sores [  ]        Heme : Bleeding problems [  ]Clotting Disorders [  ] Anemia [  ]Blood Transfusion [ ]         Endocrine : Diabetes [  ] Heat or Cold intolerance [  ] Polyuria [  ]excessive thirst [ ]         Psych : Depression [  yeS]  Anxiety [place].Blacker  ]  Psych hospitalizations [  ] Memory change [  ]                                               BP 125/79   Pulse (!) 55   Resp 20   Ht 5\' 5"  (1.651 m)   Wt 190 lb (86.2 kg)   SpO2 97% Comment: RA  BMI 31.62 kg/m  Physical Exam      Physical Exam  General: Well-nourished alert and animated middle-aged female no distress HEENT: Normocephalic pupils equal , dentition adequate Neck: Supple without JVD, adenopathy, or bruit Chest: Clear to auscultation, symmetrical breath sounds,  no rhonchi, no tenderness             or deformity Cardiovascular: Regular rate and rhythm, no murmur, no gallop, peripheral pulses             palpable in all extremities Abdomen:  Soft, nontender, no palpable mass or organomegaly Extremities: Warm, well-perfused, no clubbing cyanosis edema or tenderness,              no venous stasis changes of the legs Rectal/GU: Deferred Neuro: Grossly non--focal and symmetrical throughout Skin: Clean and dry without rash or ulceration   Diagnostic Tests: Coronary angiograms show mild-moderate left main stenosis with severe three-vessel CAD and fairly well preserved global LV function. She appears to have adequate targets for grafting and the LAD, diagonal, distal RCA, posterior descending, and possibly the OM.  Impression: Symptomatic 2 vessel CAD with preserved LV systolic function  Plan: Patient would benefit from multivessel CABG. I discussed the details of surgery including the expected benefits, expected hospital recovery, and potential risks with the patient. She understands the reasons for surgery and the potential risks associated with surgery and agrees to proceed with surgery to be scheduled at [facility] December 19   Len Childs, MD [facility] [phone]

## 2016-06-16 ENCOUNTER — Ambulatory Visit (HOSPITAL_BASED_OUTPATIENT_CLINIC_OR_DEPARTMENT_OTHER)
Admission: RE | Admit: 2016-06-16 | Discharge: 2016-06-16 | Disposition: A | Discharge status: Home / Self Care | Payer: Medicare Other | Source: Ambulatory Visit | Attending: Cardiothoracic Surgery | Admitting: Cardiothoracic Surgery

## 2016-06-16 ENCOUNTER — Ambulatory Visit (HOSPITAL_COMMUNITY)
Admission: RE | Admit: 2016-06-16 | Discharge: 2016-06-16 | Disposition: A | Discharge status: Home / Self Care | Payer: Medicare Other | Source: Ambulatory Visit | Attending: Cardiothoracic Surgery | Admitting: Cardiothoracic Surgery

## 2016-06-16 ENCOUNTER — Encounter (HOSPITAL_COMMUNITY): Payer: Self-pay

## 2016-06-16 ENCOUNTER — Ambulatory Visit (HOSPITAL_COMMUNITY): Payer: Medicare Other

## 2016-06-16 ENCOUNTER — Encounter (HOSPITAL_COMMUNITY)
Admission: RE | Admit: 2016-06-16 | Discharge: 2016-06-16 | Disposition: A | Discharge status: Home / Self Care | Payer: Medicare Other | Source: Ambulatory Visit | Attending: Cardiothoracic Surgery | Admitting: Cardiothoracic Surgery

## 2016-06-16 DIAGNOSIS — Z01812 Encounter for preprocedural laboratory examination: Secondary | ICD-10-CM | POA: Insufficient documentation

## 2016-06-16 DIAGNOSIS — E8770 Fluid overload, unspecified: Secondary | ICD-10-CM | POA: Diagnosis not present

## 2016-06-16 DIAGNOSIS — I6523 Occlusion and stenosis of bilateral carotid arteries: Secondary | ICD-10-CM | POA: Insufficient documentation

## 2016-06-16 DIAGNOSIS — I493 Ventricular premature depolarization: Secondary | ICD-10-CM | POA: Diagnosis not present

## 2016-06-16 DIAGNOSIS — D62 Acute posthemorrhagic anemia: Secondary | ICD-10-CM | POA: Diagnosis not present

## 2016-06-16 DIAGNOSIS — M5136 Other intervertebral disc degeneration, lumbar region: Secondary | ICD-10-CM | POA: Diagnosis not present

## 2016-06-16 DIAGNOSIS — E785 Hyperlipidemia, unspecified: Secondary | ICD-10-CM | POA: Diagnosis not present

## 2016-06-16 DIAGNOSIS — Z87891 Personal history of nicotine dependence: Secondary | ICD-10-CM | POA: Diagnosis not present

## 2016-06-16 DIAGNOSIS — I10 Essential (primary) hypertension: Secondary | ICD-10-CM | POA: Diagnosis not present

## 2016-06-16 DIAGNOSIS — Z8249 Family history of ischemic heart disease and other diseases of the circulatory system: Secondary | ICD-10-CM | POA: Diagnosis not present

## 2016-06-16 DIAGNOSIS — Z0183 Encounter for blood typing: Secondary | ICD-10-CM

## 2016-06-16 DIAGNOSIS — I2510 Atherosclerotic heart disease of native coronary artery without angina pectoris: Secondary | ICD-10-CM

## 2016-06-16 DIAGNOSIS — F419 Anxiety disorder, unspecified: Secondary | ICD-10-CM | POA: Diagnosis not present

## 2016-06-16 DIAGNOSIS — I25119 Atherosclerotic heart disease of native coronary artery with unspecified angina pectoris: Secondary | ICD-10-CM | POA: Diagnosis not present

## 2016-06-16 DIAGNOSIS — I4892 Unspecified atrial flutter: Secondary | ICD-10-CM | POA: Diagnosis not present

## 2016-06-16 DIAGNOSIS — K219 Gastro-esophageal reflux disease without esophagitis: Secondary | ICD-10-CM | POA: Diagnosis not present

## 2016-06-16 HISTORY — DX: Atherosclerotic heart disease of native coronary artery without angina pectoris: I25.10

## 2016-06-16 LAB — VAS US DOPPLER PRE CABG
LEFT ECA DIAS: -11 cm/s
LEFT VERTEBRAL DIAS: -10 cm/s
Left CCA dist dias: -13 cm/s
Left CCA dist sys: -56 cm/s
Left CCA prox dias: 13 cm/s
Left CCA prox sys: 59 cm/s
Left ICA dist dias: -16 cm/s
Left ICA dist sys: -45 cm/s
Left ICA prox dias: -18 cm/s
Left ICA prox sys: -57 cm/s
RIGHT ECA DIAS: -8 cm/s
RIGHT VERTEBRAL DIAS: -13 cm/s
Right CCA prox dias: 11 cm/s
Right CCA prox sys: 79 cm/s
Right cca dist sys: -37 cm/s

## 2016-06-16 LAB — PULMONARY FUNCTION TEST
DL/VA % pred: 87 %
DL/VA: 4.29 ml/min/mmHg/L
DLCO unc % pred: 80 %
DLCO unc: 20.58 ml/min/mmHg
FEF 25-75 Post: 3.57 L/sec
FEF 25-75 Pre: 2.33 L/sec
FEF2575-%Change-Post: 53 %
FEF2575-%Pred-Post: 149 %
FEF2575-%Pred-Pre: 97 %
FEV1-%Change-Post: 10 %
FEV1-%Pred-Post: 103 %
FEV1-%Pred-Pre: 93 %
FEV1-Post: 2.73 L
FEV1-Pre: 2.47 L
FEV1FVC-%Change-Post: 3 %
FEV1FVC-%Pred-Pre: 101 %
FEV6-%Change-Post: 6 %
FEV6-%Pred-Post: 101 %
FEV6-%Pred-Pre: 94 %
FEV6-Post: 3.34 L
FEV6-Pre: 3.13 L
FEV6FVC-%Change-Post: 0 %
FEV6FVC-%Pred-Post: 103 %
FEV6FVC-%Pred-Pre: 104 %
FVC-%Change-Post: 7 %
FVC-%Pred-Post: 98 %
FVC-%Pred-Pre: 91 %
FVC-Post: 3.35 L
FVC-Pre: 3.13 L
Post FEV1/FVC ratio: 81 %
Post FEV6/FVC ratio: 100 %
Pre FEV1/FVC ratio: 79 %
Pre FEV6/FVC Ratio: 100 %
RV % pred: 102 %
RV: 2.11 L
TLC % pred: 97 %
TLC: 5.08 L

## 2016-06-16 LAB — CBC
HCT: 42.3 % (ref 36.0–46.0)
Hemoglobin: 13.9 g/dL (ref 12.0–15.0)
MCH: 28.2 pg (ref 26.0–34.0)
MCHC: 32.9 g/dL (ref 30.0–36.0)
MCV: 85.8 fL (ref 78.0–100.0)
Platelets: 240 10*3/uL (ref 150–400)
RBC: 4.93 MIL/uL (ref 3.87–5.11)
RDW: 14.8 % (ref 11.5–15.5)
WBC: 11.9 10*3/uL — ABNORMAL HIGH (ref 4.0–10.5)

## 2016-06-16 LAB — URINALYSIS, ROUTINE W REFLEX MICROSCOPIC
Bacteria, UA: NONE SEEN
Bilirubin Urine: NEGATIVE
Glucose, UA: NEGATIVE mg/dL
Ketones, ur: NEGATIVE mg/dL
Nitrite: NEGATIVE
Protein, ur: NEGATIVE mg/dL
Specific Gravity, Urine: 1.018 (ref 1.005–1.030)
pH: 5 (ref 5.0–8.0)

## 2016-06-16 LAB — COMPREHENSIVE METABOLIC PANEL
ALT: 19 U/L (ref 14–54)
AST: 16 U/L (ref 15–41)
Albumin: 4.1 g/dL (ref 3.5–5.0)
Alkaline Phosphatase: 51 U/L (ref 38–126)
Anion gap: 12 (ref 5–15)
BUN: 10 mg/dL (ref 6–20)
CO2: 24 mmol/L (ref 22–32)
Calcium: 9.5 mg/dL (ref 8.9–10.3)
Chloride: 104 mmol/L (ref 101–111)
Creatinine, Ser: 0.68 mg/dL (ref 0.44–1.00)
GFR calc Af Amer: >60 mL/min (ref >60)
GFR calc non Af Amer: >60 mL/min (ref >60)
Glucose, Bld: 99 mg/dL (ref 65–99)
Potassium: 3.2 mmol/L — ABNORMAL LOW (ref 3.5–5.1)
Sodium: 140 mmol/L (ref 135–145)
Total Bilirubin: 0.9 mg/dL (ref 0.3–1.2)
Total Protein: 6.7 g/dL (ref 6.5–8.1)

## 2016-06-16 LAB — BLOOD GAS, ARTERIAL
Acid-Base Excess: 3.8 mmol/L — ABNORMAL HIGH (ref 0.0–2.0)
Bicarbonate: 27.4 mmol/L (ref 20.0–28.0)
Drawn by: 421801
FIO2: 0.21
O2 Saturation: 96.1 %
Patient temperature: 98.6
pCO2 arterial: 38.7 mmHg (ref 32.0–48.0)
pH, Arterial: 7.464 — ABNORMAL HIGH (ref 7.350–7.450)
pO2, Arterial: 80.2 mmHg — ABNORMAL LOW (ref 83.0–108.0)

## 2016-06-16 LAB — TYPE AND SCREEN
ABO/RH(D): O POS
Antibody Screen: NEGATIVE

## 2016-06-16 LAB — SURGICAL PCR SCREEN
MRSA, PCR: NEGATIVE
Staphylococcus aureus: NEGATIVE

## 2016-06-16 LAB — PROTIME-INR
INR: 1
Prothrombin Time: 13.2 seconds (ref 11.4–15.2)

## 2016-06-16 LAB — APTT: aPTT: 32 seconds (ref 24–36)

## 2016-06-16 MED ORDER — SODIUM CHLORIDE 0.9 % IV SOLN
INTRAVENOUS | Status: DC
  Filled 2016-06-16: qty 30

## 2016-06-16 MED ORDER — DEXTROSE 5 % IV SOLN
750.0000 mg | INTRAVENOUS | Status: DC
  Filled 2016-06-16 (×2): qty 750

## 2016-06-16 MED ORDER — POTASSIUM CHLORIDE 2 MEQ/ML IV SOLN
80.0000 meq | INTRAVENOUS | Status: DC
  Filled 2016-06-16: qty 40

## 2016-06-16 MED ORDER — TRANEXAMIC ACID (OHS) BOLUS VIA INFUSION: 15.0000 mg/kg | INTRAVENOUS | Status: DC

## 2016-06-16 MED ORDER — NITROGLYCERIN IN D5W 200-5 MCG/ML-% IV SOLN
2.0000 ug/min | INTRAVENOUS | Status: DC
  Filled 2016-06-16: qty 250

## 2016-06-16 MED ORDER — POTASSIUM CHLORIDE 2 MEQ/ML IV SOLN: 80.0000 meq | INTRAVENOUS | Status: DC

## 2016-06-16 MED ORDER — PHENYLEPHRINE HCL 10 MG/ML IJ SOLN
30.0000 ug/min | INTRAVENOUS | Status: DC
  Filled 2016-06-16: qty 2

## 2016-06-16 MED ORDER — TRANEXAMIC ACID (OHS) PUMP PRIME SOLUTION: 2.0000 mg/kg | INTRAVENOUS | Status: DC

## 2016-06-16 MED ORDER — NITROGLYCERIN IN D5W 200-5 MCG/ML-% IV SOLN: 2.0000 ug/min | INTRAVENOUS | Status: DC

## 2016-06-16 MED ORDER — PLASMA-LYTE 148 IV SOLN: INTRAVENOUS | Status: DC

## 2016-06-16 MED ORDER — TRANEXAMIC ACID 1000 MG/10ML IV SOLN: 1.5000 mg/kg/h | INTRAVENOUS | Status: DC

## 2016-06-16 MED ORDER — DEXMEDETOMIDINE HCL IN NACL 400 MCG/100ML IV SOLN
0.1000 ug/kg/h | INTRAVENOUS | Status: DC
  Filled 2016-06-16: qty 100

## 2016-06-16 MED ORDER — DOPAMINE-DEXTROSE 3.2-5 MG/ML-% IV SOLN: 0.0000 ug/kg/min | INTRAVENOUS | Status: DC

## 2016-06-16 MED ORDER — SODIUM CHLORIDE 0.9 % IV SOLN: INTRAVENOUS | Status: DC

## 2016-06-16 MED ORDER — EPINEPHRINE PF 1 MG/ML IJ SOLN: 0.0000 ug/min | INTRAVENOUS | Status: DC

## 2016-06-16 MED ORDER — VANCOMYCIN HCL 10 G IV SOLR
1250.0000 mg | INTRAVENOUS | Status: DC
  Filled 2016-06-16 (×2): qty 1250

## 2016-06-16 MED ORDER — SODIUM CHLORIDE 0.9 % IV SOLN
INTRAVENOUS | Status: DC
  Filled 2016-06-16: qty 2.5

## 2016-06-16 MED ORDER — ALBUTEROL SULFATE (2.5 MG/3ML) 0.083% IN NEBU
2.5000 mg | INHALATION_SOLUTION | Freq: Once | RESPIRATORY_TRACT | Status: AC
  Administered 2016-06-16: 2.5 mg via RESPIRATORY_TRACT

## 2016-06-16 MED ORDER — DEXTROSE 5 % IV SOLN: 750.0000 mg | INTRAVENOUS | Status: DC

## 2016-06-16 MED ORDER — EPINEPHRINE PF 1 MG/ML IJ SOLN
0.0000 ug/min | INTRAMUSCULAR | Status: DC
  Filled 2016-06-16: qty 4

## 2016-06-16 MED ORDER — PHENYLEPHRINE HCL 10 MG/ML IJ SOLN: 30.0000 ug/min | INTRAVENOUS | Status: DC

## 2016-06-16 MED ORDER — VANCOMYCIN HCL 10 G IV SOLR
1250.0000 mg | INTRAVENOUS | Status: DC
  Filled 2016-06-16: qty 1250

## 2016-06-16 MED ORDER — TRANEXAMIC ACID (OHS) PUMP PRIME SOLUTION
2.0000 mg/kg | INTRAVENOUS | Status: DC
  Filled 2016-06-16: qty 1.72

## 2016-06-16 MED ORDER — DEXMEDETOMIDINE HCL IN NACL 400 MCG/100ML IV SOLN: 0.1000 ug/kg/h | INTRAVENOUS | Status: DC

## 2016-06-16 MED ORDER — TRANEXAMIC ACID (OHS) BOLUS VIA INFUSION
15.0000 mg/kg | INTRAVENOUS | Status: DC
  Filled 2016-06-16: qty 1293

## 2016-06-16 MED ORDER — MAGNESIUM SULFATE 50 % IJ SOLN
40.0000 meq | INTRAMUSCULAR | Status: DC
  Filled 2016-06-16: qty 10

## 2016-06-16 MED ORDER — DEXTROSE 5 % IV SOLN: 1.5000 g | INTRAVENOUS | Status: DC

## 2016-06-16 MED ORDER — TRANEXAMIC ACID 1000 MG/10ML IV SOLN
1.5000 mg/kg/h | INTRAVENOUS | Status: DC
  Filled 2016-06-16: qty 25

## 2016-06-16 MED ORDER — DOPAMINE-DEXTROSE 3.2-5 MG/ML-% IV SOLN
0.0000 ug/kg/min | INTRAVENOUS | Status: DC
  Filled 2016-06-16: qty 250

## 2016-06-16 MED ORDER — CEFUROXIME SODIUM 1.5 G IJ SOLR
1.5000 g | INTRAMUSCULAR | Status: DC
  Filled 2016-06-16 (×2): qty 1.5

## 2016-06-16 MED ORDER — PLASMA-LYTE 148 IV SOLN
INTRAVENOUS | Status: AC
  Administered 2016-06-17: 500 mL
  Filled 2016-06-16: qty 2.5

## 2016-06-16 MED ORDER — MAGNESIUM SULFATE 50 % IJ SOLN: 40.0000 meq | INTRAMUSCULAR | Status: DC

## 2016-06-16 NOTE — Progress Notes (Signed)
Pre-op Cardiac Surgery  Carotid Findings: Bilateral:  1-39% ICA stenosis.  Vertebral artery flow is antegrade.     Upper Extremity Right Left  Brachial Pressures 126 Triphasic 136 Triphasic  Radial Waveforms Triphasic Triphasic  Ulnar Waveforms Triphasic Triphasic  Palmar Arch (Allen's Test) Normal Normal   Findings:  Doppler waveforms remained normal bilaterally with both radial and ulnar compressions    Lower  Extremity Right Left  Dorsalis Pedis 159 Triphasic 157 Triphasic  Posterior Tibial 155 triphasic 167 Biphasic  Ankle/Brachial Indices 1.17 1.23    Findings:  ABIs and Doppler waveforms are within normal limits bilaterally at rest.  Toma Copier, RVS 02:40 PM

## 2016-06-16 NOTE — Pre-Procedure Instructions (Addendum)
Azaya Mccoskey  06/16/2016      [facility] [address], [place] - [id number] [place] [id number] [address] [place] [address] Phone: [phone] Fax: [phone]    Your procedure is scheduled on Tuesday, June 17, 2016  Report to [facility] Admitting at 5:30 A.M.  Call this number if you have problems the morning of surgery:  [phone]   Remember:  Do not eat food or drink liquids after midnight.  Take these medicines the morning of surgery with A SIP OF WATER: Metoprolol  Stop taking vitamins, omega-3 fish oil (MAXEPA) and herbal medications. Do not take any NSAIDs ie: Ibuprofen, Advil, Naproxen,BC and Goody Powder; stop now.  Do not wear jewelry, make-up or nail polish.  Do not wear lotions, powders, or perfumes, or deoderant.  Do not shave 48 hours prior to surgery.   Do not bring valuables to the hospital.  [facility] is not responsible for any belongings or valuables.  Contacts, dentures or bridgework may not be worn into surgery.  Leave your suitcase in the car.  After surgery it may be brought to your room.  For patients admitted to the hospital, discharge time will be determined by your treatment team.  Special instructions:  [facility] - Preparing for Surgery  Before surgery, you can play an important role.  Because skin is not sterile, your skin needs to be as free of germs as possible.  You can reduce the number of germs on you skin by washing with CHG (chlorahexidine gluconate) soap before surgery.  CHG is an antiseptic cleaner which kills germs and bonds with the skin to continue killing germs even after washing.  Please DO NOT use if you have an allergy to CHG or antibacterial soaps.  If your skin becomes reddened/irritated stop using the CHG and inform your nurse when you arrive at Short Stay.  Do not shave (including legs and underarms) for at least 48 hours prior to the first CHG shower.  You may shave your face.  Please follow  these instructions carefully:   1.  Shower with CHG Soap the night before surgery and the morning of Surgery.  2.  If you choose to wash your hair, wash your hair first as usual with your normal shampoo.  3.  After you shampoo, rinse your hair and body thoroughly to remove the Shampoo.  4.  Use CHG as you would any other liquid soap.  You can apply chg directly  to the skin and wash gently with scrungie or a clean washcloth.  5.  Apply the CHG Soap to your body ONLY FROM THE NECK DOWN.  Do not use on open wounds or open sores.  Avoid contact with your eyes, ears, mouth and genitals (private parts).  Wash genitals (private parts) with your normal soap.  6.  Wash thoroughly, paying special attention to the area where your surgery will be performed.  7.  Thoroughly rinse your body with warm water from the neck down.  8.  DO NOT shower/wash with your normal soap after using and rinsing off the CHG Soap.  9.  Pat yourself dry with a clean towel.            10.  Wear clean pajamas.            11.  Place clean sheets on your bed the night of your first shower and do not sleep with pets.  Day of Surgery  Do  not apply any lotions/deoderants the morning of surgery.  Please wear clean clothes to the hospital/surgery center.  Please read over the following fact sheets that you were given. Pain Booklet, Coughing and Deep Breathing, Blood Transfusion Information, MRSA Information and Surgical Site Infection Prevention

## 2016-06-16 NOTE — Anesthesia Preprocedure Evaluation (Signed)
Anesthesia Evaluation  Patient identified by MRN, date of birth, ID band Patient awake    Reviewed: Allergy & Precautions, NPO status , Patient's Chart, lab work & pertinent test results  Airway Mallampati: II  TM Distance: >3 FB Neck ROM: Full    Dental no notable dental hx.    Pulmonary former smoker,    Pulmonary exam normal breath sounds clear to auscultation       Cardiovascular hypertension, + CAD  Normal cardiovascular exam Rhythm:Regular Rate:Normal     Neuro/Psych PSYCHIATRIC DISORDERS Anxiety Depression negative neurological ROS     GI/Hepatic Neg liver ROS, GERD  ,  Endo/Other  negative endocrine ROS  Renal/GU negative Renal ROS     Musculoskeletal  (+) Arthritis ,   Abdominal   Peds  Hematology negative hematology ROS (+)   Anesthesia Other Findings   Reproductive/Obstetrics negative OB ROS                             Anesthesia Physical Anesthesia Plan  ASA: IV  Anesthesia Plan: General   Post-op Pain Management:    Induction: Intravenous  Airway Management Planned: Oral ETT  Additional Equipment: Arterial line, TEE, CVP, Ultrasound Guidance Line Placement and PA Cath  Intra-op Plan:   Post-operative Plan: Post-operative intubation/ventilation  Informed Consent: I have reviewed the patients History and Physical, chart, labs and discussed the procedure including the risks, benefits and alternatives for the proposed anesthesia with the patient or authorized representative who has indicated his/her understanding and acceptance.   Dental advisory given  Plan Discussed with: CRNA  Anesthesia Plan Comments:         Anesthesia Quick Evaluation

## 2016-06-16 NOTE — Progress Notes (Signed)
Pt denies SOB and chest pain. Ryan, RN, advised that pt not take Aspirin on DOS. Type and Screen and A1c still pending. UA abnormal, make MD aware on DOS, office currently closed.

## 2016-06-17 ENCOUNTER — Inpatient Hospital Stay (HOSPITAL_COMMUNITY): Payer: Medicare Other | Admitting: Certified Registered Nurse Anesthetist

## 2016-06-17 ENCOUNTER — Encounter (HOSPITAL_COMMUNITY)
Admission: RE | Disposition: A | Discharge status: Skilled Nursing Facility | Payer: Self-pay | Source: Ambulatory Visit | Attending: Cardiothoracic Surgery

## 2016-06-17 ENCOUNTER — Inpatient Hospital Stay (HOSPITAL_COMMUNITY)
Admission: RE | Admit: 2016-06-17 | Discharge: 2016-06-24 | DRG: 236 | Disposition: A | Discharge status: Skilled Nursing Facility | Payer: Medicare Other | Source: Ambulatory Visit | Attending: Cardiothoracic Surgery | Admitting: Cardiothoracic Surgery

## 2016-06-17 ENCOUNTER — Inpatient Hospital Stay (HOSPITAL_COMMUNITY): Payer: Medicare Other

## 2016-06-17 ENCOUNTER — Encounter: Payer: Medicare Other | Admitting: Adult Health

## 2016-06-17 ENCOUNTER — Encounter (HOSPITAL_COMMUNITY): Payer: Self-pay | Admitting: *Deleted

## 2016-06-17 ENCOUNTER — Other Ambulatory Visit (HOSPITAL_COMMUNITY): Payer: Medicare Other

## 2016-06-17 DIAGNOSIS — Z87891 Personal history of nicotine dependence: Secondary | ICD-10-CM | POA: Diagnosis not present

## 2016-06-17 DIAGNOSIS — I25810 Atherosclerosis of coronary artery bypass graft(s) without angina pectoris: Secondary | ICD-10-CM | POA: Diagnosis not present

## 2016-06-17 DIAGNOSIS — Z951 Presence of aortocoronary bypass graft: Secondary | ICD-10-CM | POA: Diagnosis not present

## 2016-06-17 DIAGNOSIS — I2510 Atherosclerotic heart disease of native coronary artery without angina pectoris: Secondary | ICD-10-CM

## 2016-06-17 DIAGNOSIS — E119 Type 2 diabetes mellitus without complications: Secondary | ICD-10-CM | POA: Diagnosis present

## 2016-06-17 DIAGNOSIS — E785 Hyperlipidemia, unspecified: Secondary | ICD-10-CM | POA: Diagnosis present

## 2016-06-17 DIAGNOSIS — F419 Anxiety disorder, unspecified: Secondary | ICD-10-CM | POA: Diagnosis present

## 2016-06-17 DIAGNOSIS — Z8249 Family history of ischemic heart disease and other diseases of the circulatory system: Secondary | ICD-10-CM

## 2016-06-17 DIAGNOSIS — I25110 Atherosclerotic heart disease of native coronary artery with unstable angina pectoris: Secondary | ICD-10-CM | POA: Diagnosis not present

## 2016-06-17 DIAGNOSIS — I4892 Unspecified atrial flutter: Secondary | ICD-10-CM | POA: Diagnosis not present

## 2016-06-17 DIAGNOSIS — D62 Acute posthemorrhagic anemia: Secondary | ICD-10-CM | POA: Diagnosis not present

## 2016-06-17 DIAGNOSIS — M6281 Muscle weakness (generalized): Secondary | ICD-10-CM | POA: Diagnosis not present

## 2016-06-17 DIAGNOSIS — J90 Pleural effusion, not elsewhere classified: Secondary | ICD-10-CM | POA: Diagnosis not present

## 2016-06-17 DIAGNOSIS — M5136 Other intervertebral disc degeneration, lumbar region: Secondary | ICD-10-CM | POA: Diagnosis present

## 2016-06-17 DIAGNOSIS — I493 Ventricular premature depolarization: Secondary | ICD-10-CM | POA: Diagnosis not present

## 2016-06-17 DIAGNOSIS — E8770 Fluid overload, unspecified: Secondary | ICD-10-CM | POA: Diagnosis not present

## 2016-06-17 DIAGNOSIS — I4891 Unspecified atrial fibrillation: Secondary | ICD-10-CM | POA: Diagnosis present

## 2016-06-17 DIAGNOSIS — Z4682 Encounter for fitting and adjustment of non-vascular catheter: Secondary | ICD-10-CM | POA: Diagnosis not present

## 2016-06-17 DIAGNOSIS — K219 Gastro-esophageal reflux disease without esophagitis: Secondary | ICD-10-CM | POA: Diagnosis present

## 2016-06-17 DIAGNOSIS — Z79899 Other long term (current) drug therapy: Secondary | ICD-10-CM | POA: Diagnosis not present

## 2016-06-17 DIAGNOSIS — R0789 Other chest pain: Secondary | ICD-10-CM | POA: Diagnosis not present

## 2016-06-17 DIAGNOSIS — E876 Hypokalemia: Secondary | ICD-10-CM | POA: Diagnosis present

## 2016-06-17 DIAGNOSIS — J9811 Atelectasis: Secondary | ICD-10-CM | POA: Diagnosis not present

## 2016-06-17 DIAGNOSIS — I25119 Atherosclerotic heart disease of native coronary artery with unspecified angina pectoris: Secondary | ICD-10-CM | POA: Diagnosis present

## 2016-06-17 DIAGNOSIS — I341 Nonrheumatic mitral (valve) prolapse: Secondary | ICD-10-CM | POA: Diagnosis not present

## 2016-06-17 DIAGNOSIS — I10 Essential (primary) hypertension: Secondary | ICD-10-CM | POA: Diagnosis present

## 2016-06-17 DIAGNOSIS — R079 Chest pain, unspecified: Secondary | ICD-10-CM | POA: Diagnosis not present

## 2016-06-17 DIAGNOSIS — R531 Weakness: Secondary | ICD-10-CM | POA: Diagnosis not present

## 2016-06-17 HISTORY — PX: TEE WITHOUT CARDIOVERSION: SHX5443

## 2016-06-17 HISTORY — PX: CORONARY ARTERY BYPASS GRAFT: SHX141

## 2016-06-17 LAB — POCT I-STAT 3, ART BLOOD GAS (G3+)
Acid-Base Excess: 4 mmol/L — ABNORMAL HIGH (ref 0.0–2.0)
Acid-Base Excess: 3 mmol/L — ABNORMAL HIGH (ref 0.0–2.0)
Bicarbonate: 28.4 mmol/L — ABNORMAL HIGH (ref 20.0–28.0)
Bicarbonate: 27.6 mmol/L (ref 20.0–28.0)
Bicarbonate: 25.8 mmol/L (ref 20.0–28.0)
Bicarbonate: 25 mmol/L (ref 20.0–28.0)
O2 Saturation: 100 %
O2 Saturation: 100 %
O2 Saturation: 95 %
O2 Saturation: 97 %
Patient temperature: 36
Patient temperature: 36.6
TCO2: 30 mmol/L (ref 0–100)
TCO2: 29 mmol/L (ref 0–100)
TCO2: 27 mmol/L (ref 0–100)
TCO2: 26 mmol/L (ref 0–100)
pCO2 arterial: 38.6 mmHg (ref 32.0–48.0)
pCO2 arterial: 41.4 mmHg (ref 32.0–48.0)
pCO2 arterial: 42.6 mmHg (ref 32.0–48.0)
pCO2 arterial: 42.8 mmHg (ref 32.0–48.0)
pH, Arterial: 7.474 — ABNORMAL HIGH (ref 7.350–7.450)
pH, Arterial: 7.432 (ref 7.350–7.450)
pH, Arterial: 7.385 (ref 7.350–7.450)
pH, Arterial: 7.372 (ref 7.350–7.450)
pO2, Arterial: 363 mmHg — ABNORMAL HIGH (ref 83.0–108.0)
pO2, Arterial: 265 mmHg — ABNORMAL HIGH (ref 83.0–108.0)
pO2, Arterial: 71 mmHg — ABNORMAL LOW (ref 83.0–108.0)
pO2, Arterial: 88 mmHg (ref 83.0–108.0)

## 2016-06-17 LAB — CBC
HCT: 32.8 % — ABNORMAL LOW (ref 36.0–46.0)
HEMATOCRIT: 34.4 % — AB (ref 36.0–46.0)
HEMOGLOBIN: 11.5 g/dL — AB (ref 12.0–15.0)
Hemoglobin: 11 g/dL — ABNORMAL LOW (ref 12.0–15.0)
MCH: 28.3 pg (ref 26.0–34.0)
MCH: 28.5 pg (ref 26.0–34.0)
MCHC: 33.4 g/dL (ref 30.0–36.0)
MCHC: 33.5 g/dL (ref 30.0–36.0)
MCV: 84.7 fL (ref 78.0–100.0)
MCV: 85 fL (ref 78.0–100.0)
Platelets: 155 10*3/uL (ref 150–400)
Platelets: 165 10*3/uL (ref 150–400)
RBC: 4.06 MIL/uL (ref 3.87–5.11)
RBC: 3.86 MIL/uL — ABNORMAL LOW (ref 3.87–5.11)
RDW: 14.5 % (ref 11.5–15.5)
RDW: 14.4 % (ref 11.5–15.5)
WBC: 18.4 10*3/uL — ABNORMAL HIGH (ref 4.0–10.5)
WBC: 20.2 10*3/uL — ABNORMAL HIGH (ref 4.0–10.5)

## 2016-06-17 LAB — POCT I-STAT, CHEM 8
BUN: 11 mg/dL (ref 6–20)
BUN: 10 mg/dL (ref 6–20)
BUN: 8 mg/dL (ref 6–20)
BUN: 9 mg/dL (ref 6–20)
BUN: 7 mg/dL (ref 6–20)
Calcium, Ion: 1.22 mmol/L (ref 1.15–1.40)
Calcium, Ion: 0.98 mmol/L — ABNORMAL LOW (ref 1.15–1.40)
Calcium, Ion: 1.21 mmol/L (ref 1.15–1.40)
Calcium, Ion: 1.07 mmol/L — ABNORMAL LOW (ref 1.15–1.40)
Calcium, Ion: 1.07 mmol/L — ABNORMAL LOW (ref 1.15–1.40)
Chloride: 102 mmol/L (ref 101–111)
Chloride: 103 mmol/L (ref 101–111)
Chloride: 99 mmol/L — ABNORMAL LOW (ref 101–111)
Chloride: 99 mmol/L — ABNORMAL LOW (ref 101–111)
Chloride: 100 mmol/L — ABNORMAL LOW (ref 101–111)
Creatinine, Ser: 0.5 mg/dL (ref 0.44–1.00)
Creatinine, Ser: 0.3 mg/dL — ABNORMAL LOW (ref 0.44–1.00)
Creatinine, Ser: 0.5 mg/dL (ref 0.44–1.00)
Creatinine, Ser: 0.4 mg/dL — ABNORMAL LOW (ref 0.44–1.00)
Creatinine, Ser: 0.5 mg/dL (ref 0.44–1.00)
Glucose, Bld: 136 mg/dL — ABNORMAL HIGH (ref 65–99)
Glucose, Bld: 122 mg/dL — ABNORMAL HIGH (ref 65–99)
Glucose, Bld: 138 mg/dL — ABNORMAL HIGH (ref 65–99)
Glucose, Bld: 155 mg/dL — ABNORMAL HIGH (ref 65–99)
Glucose, Bld: 173 mg/dL — ABNORMAL HIGH (ref 65–99)
HCT: 36 % (ref 36.0–46.0)
HCT: 26 % — ABNORMAL LOW (ref 36.0–46.0)
HCT: 31 % — ABNORMAL LOW (ref 36.0–46.0)
HCT: 25 % — ABNORMAL LOW (ref 36.0–46.0)
HCT: 29 % — ABNORMAL LOW (ref 36.0–46.0)
Hemoglobin: 12.2 g/dL (ref 12.0–15.0)
Hemoglobin: 10.5 g/dL — ABNORMAL LOW (ref 12.0–15.0)
Hemoglobin: 8.8 g/dL — ABNORMAL LOW (ref 12.0–15.0)
Hemoglobin: 8.5 g/dL — ABNORMAL LOW (ref 12.0–15.0)
Hemoglobin: 9.9 g/dL — ABNORMAL LOW (ref 12.0–15.0)
Potassium: 3.1 mmol/L — ABNORMAL LOW (ref 3.5–5.1)
Potassium: 3.2 mmol/L — ABNORMAL LOW (ref 3.5–5.1)
Potassium: 3.5 mmol/L (ref 3.5–5.1)
Potassium: 3.7 mmol/L (ref 3.5–5.1)
Potassium: 3.2 mmol/L — ABNORMAL LOW (ref 3.5–5.1)
Sodium: 141 mmol/L (ref 135–145)
Sodium: 140 mmol/L (ref 135–145)
Sodium: 140 mmol/L (ref 135–145)
Sodium: 138 mmol/L (ref 135–145)
Sodium: 140 mmol/L (ref 135–145)
TCO2: 29 mmol/L (ref 0–100)
TCO2: 27 mmol/L (ref 0–100)
TCO2: 27 mmol/L (ref 0–100)
TCO2: 26 mmol/L (ref 0–100)
TCO2: 26 mmol/L (ref 0–100)

## 2016-06-17 LAB — POCT I-STAT 4, (NA,K, GLUC, HGB,HCT)
Glucose, Bld: 124 mg/dL — ABNORMAL HIGH (ref 65–99)
HCT: 31 % — ABNORMAL LOW (ref 36.0–46.0)
Hemoglobin: 10.5 g/dL — ABNORMAL LOW (ref 12.0–15.0)
Potassium: 2.7 mmol/L — CL (ref 3.5–5.1)
Sodium: 145 mmol/L (ref 135–145)

## 2016-06-17 LAB — HEMOGLOBIN A1C
Hgb A1c MFr Bld: 5.5 % (ref 4.8–5.6)
Mean Plasma Glucose: 111 mg/dL

## 2016-06-17 LAB — GLUCOSE, CAPILLARY
Glucose-Capillary: 105 mg/dL — ABNORMAL HIGH (ref 65–99)
Glucose-Capillary: 122 mg/dL — ABNORMAL HIGH (ref 65–99)
Glucose-Capillary: 112 mg/dL — ABNORMAL HIGH (ref 65–99)
Glucose-Capillary: 116 mg/dL — ABNORMAL HIGH (ref 65–99)
Glucose-Capillary: 135 mg/dL — ABNORMAL HIGH (ref 65–99)
Glucose-Capillary: 148 mg/dL — ABNORMAL HIGH (ref 65–99)
Glucose-Capillary: 127 mg/dL — ABNORMAL HIGH (ref 65–99)
Glucose-Capillary: 110 mg/dL — ABNORMAL HIGH (ref 65–99)
Glucose-Capillary: 120 mg/dL — ABNORMAL HIGH (ref 65–99)
Glucose-Capillary: 119 mg/dL — ABNORMAL HIGH (ref 65–99)

## 2016-06-17 LAB — APTT: APTT: 34 s (ref 24–36)

## 2016-06-17 LAB — PROTIME-INR
INR: 1.23
PROTHROMBIN TIME: 15.6 s — AB (ref 11.4–15.2)

## 2016-06-17 LAB — CREATININE, SERUM
Creatinine, Ser: 0.53 mg/dL (ref 0.44–1.00)
GFR calc Af Amer: >60 mL/min (ref >60)
GFR calc non Af Amer: >60 mL/min (ref >60)

## 2016-06-17 LAB — MAGNESIUM: Magnesium: 2.7 mg/dL — ABNORMAL HIGH (ref 1.7–2.4)

## 2016-06-17 LAB — ABO/RH: ABO/RH(D): O POS

## 2016-06-17 LAB — PLATELET COUNT: Platelets: 186 10*3/uL (ref 150–400)

## 2016-06-17 LAB — HEMOGLOBIN AND HEMATOCRIT, BLOOD
HCT: 28.1 % — ABNORMAL LOW (ref 36.0–46.0)
Hemoglobin: 9.5 g/dL — ABNORMAL LOW (ref 12.0–15.0)

## 2016-06-17 SURGERY — CORONARY ARTERY BYPASS GRAFTING (CABG)
Anesthesia: General | Site: Chest

## 2016-06-17 MED ORDER — GLYCOPYRROLATE 0.2 MG/ML IJ SOLN
INTRAMUSCULAR | Status: DC | PRN
  Administered 2016-06-17 (×3): 0.2 mg via INTRAVENOUS

## 2016-06-17 MED ORDER — PROPOFOL 10 MG/ML IV BOLUS
INTRAVENOUS | Status: AC
  Filled 2016-06-17: qty 20

## 2016-06-17 MED ORDER — LACTATED RINGERS IV SOLN
INTRAVENOUS | Status: DC | PRN
  Administered 2016-06-17 (×2): via INTRAVENOUS

## 2016-06-17 MED ORDER — VECURONIUM BROMIDE 10 MG IV SOLR
INTRAVENOUS | Status: DC | PRN
  Administered 2016-06-17 (×3): 5 mg via INTRAVENOUS
  Administered 2016-06-17: 10 mg via INTRAVENOUS
  Administered 2016-06-17: 5 mg via INTRAVENOUS

## 2016-06-17 MED ORDER — TRANEXAMIC ACID 1000 MG/10ML IV SOLN
INTRAVENOUS | Status: DC | PRN
  Administered 2016-06-17: 1.5 mg/kg/h via INTRAVENOUS

## 2016-06-17 MED ORDER — LACTATED RINGERS IV SOLN: 500.0000 mL | Freq: Once | INTRAVENOUS | Status: DC | PRN

## 2016-06-17 MED ORDER — FENTANYL CITRATE (PF) 250 MCG/5ML IJ SOLN
INTRAMUSCULAR | Status: AC
  Filled 2016-06-17: qty 5

## 2016-06-17 MED ORDER — CHLORHEXIDINE GLUCONATE 0.12 % MT SOLN
15.0000 mL | Freq: Two times a day (BID) | OROMUCOSAL | Status: DC
  Administered 2016-06-17 – 2016-06-18 (×2): 15 mL via OROMUCOSAL
  Filled 2016-06-17: qty 15

## 2016-06-17 MED ORDER — CEFUROXIME SODIUM 1.5 G IJ SOLR
1.5000 g | INTRAMUSCULAR | Status: AC
  Administered 2016-06-17: 1.5 g via INTRAVENOUS
  Administered 2016-06-17: .75 g via INTRAVENOUS
  Filled 2016-06-17: qty 1.5

## 2016-06-17 MED ORDER — SODIUM CHLORIDE 0.9 % IJ SOLN
OROMUCOSAL | Status: DC | PRN
  Administered 2016-06-17 (×3): 4 mL via TOPICAL

## 2016-06-17 MED ORDER — CHLORHEXIDINE GLUCONATE 0.12 % MT SOLN
15.0000 mL | Freq: Once | OROMUCOSAL | Status: AC
  Administered 2016-06-17: 15 mL via OROMUCOSAL
  Filled 2016-06-17: qty 15

## 2016-06-17 MED ORDER — FENTANYL CITRATE (PF) 250 MCG/5ML IJ SOLN
INTRAMUSCULAR | Status: AC
  Filled 2016-06-17: qty 20

## 2016-06-17 MED ORDER — LACTATED RINGERS IV SOLN: INTRAVENOUS | Status: DC

## 2016-06-17 MED ORDER — HEPARIN SODIUM (PORCINE) 1000 UNIT/ML IJ SOLN
INTRAMUSCULAR | Status: DC | PRN
  Administered 2016-06-17: 23000 [IU] via INTRAVENOUS
  Administered 2016-06-17: 3000 [IU] via INTRAVENOUS

## 2016-06-17 MED ORDER — MORPHINE SULFATE (PF) 2 MG/ML IV SOLN
1.0000 mg | INTRAVENOUS | Status: AC | PRN
  Administered 2016-06-17: 2 mg via INTRAVENOUS

## 2016-06-17 MED ORDER — OXYCODONE HCL 5 MG PO TABS
5.0000 mg | ORAL_TABLET | ORAL | Status: DC | PRN
  Administered 2016-06-17 – 2016-06-18 (×4): 5 mg via ORAL
  Administered 2016-06-19 (×2): 10 mg via ORAL
  Administered 2016-06-19: 5 mg via ORAL
  Administered 2016-06-20 – 2016-06-24 (×11): 10 mg via ORAL
  Filled 2016-06-17 (×6): qty 2
  Filled 2016-06-17: qty 1
  Filled 2016-06-17 (×10): qty 2

## 2016-06-17 MED ORDER — ACETAMINOPHEN 650 MG RE SUPP
650.0000 mg | Freq: Once | RECTAL | Status: AC
  Administered 2016-06-17: 650 mg via RECTAL

## 2016-06-17 MED ORDER — CHLORHEXIDINE GLUCONATE 0.12 % MT SOLN
15.0000 mL | OROMUCOSAL | Status: AC
  Administered 2016-06-17: 15 mL via OROMUCOSAL

## 2016-06-17 MED ORDER — DEXTROSE 5 % IV SOLN
1.5000 g | Freq: Two times a day (BID) | INTRAVENOUS | Status: AC
  Administered 2016-06-17 – 2016-06-19 (×4): 1.5 g via INTRAVENOUS
  Filled 2016-06-17 (×4): qty 1.5

## 2016-06-17 MED ORDER — ASPIRIN 81 MG PO CHEW
324.0000 mg | CHEWABLE_TABLET | Freq: Every day | ORAL | Status: DC
Start: 2016-06-18 — End: 2016-06-23

## 2016-06-17 MED ORDER — PROPOFOL 10 MG/ML IV BOLUS
INTRAVENOUS | Status: DC | PRN
  Administered 2016-06-17: 50 mg via INTRAVENOUS

## 2016-06-17 MED ORDER — SIMVASTATIN 20 MG PO TABS
20.0000 mg | ORAL_TABLET | Freq: Every day | ORAL | Status: DC
  Administered 2016-06-17 – 2016-06-23 (×7): 20 mg via ORAL
  Filled 2016-06-17 (×7): qty 1

## 2016-06-17 MED ORDER — DEXMEDETOMIDINE HCL IN NACL 400 MCG/100ML IV SOLN
INTRAVENOUS | Status: DC | PRN
  Administered 2016-06-17: .3 ug/kg/h via INTRAVENOUS

## 2016-06-17 MED ORDER — CHLORHEXIDINE GLUCONATE 4 % EX LIQD: 30.0000 mL | CUTANEOUS | Status: DC

## 2016-06-17 MED ORDER — NITROGLYCERIN IN D5W 200-5 MCG/ML-% IV SOLN
INTRAVENOUS | Status: DC | PRN
  Administered 2016-06-17: 3 ug/min via INTRAVENOUS

## 2016-06-17 MED ORDER — VANCOMYCIN HCL 10 G IV SOLR
1250.0000 mg | INTRAVENOUS | Status: AC
  Administered 2016-06-17: 1250 mg via INTRAVENOUS
  Filled 2016-06-17: qty 1250

## 2016-06-17 MED ORDER — ASPIRIN EC 325 MG PO TBEC
325.0000 mg | DELAYED_RELEASE_TABLET | Freq: Every day | ORAL | Status: DC
  Administered 2016-06-18 – 2016-06-24 (×7): 325 mg via ORAL
  Filled 2016-06-17 (×8): qty 1

## 2016-06-17 MED ORDER — DEXMEDETOMIDINE HCL IN NACL 200 MCG/50ML IV SOLN
0.0000 ug/kg/h | INTRAVENOUS | Status: DC
  Administered 2016-06-17: 0.1 ug/kg/h via INTRAVENOUS

## 2016-06-17 MED ORDER — PHENYLEPHRINE 40 MCG/ML (10ML) SYRINGE FOR IV PUSH (FOR BLOOD PRESSURE SUPPORT)
PREFILLED_SYRINGE | INTRAVENOUS | Status: AC
  Filled 2016-06-17: qty 10

## 2016-06-17 MED ORDER — 0.9 % SODIUM CHLORIDE (POUR BTL) OPTIME
TOPICAL | Status: DC | PRN
  Administered 2016-06-17: 6000 mL

## 2016-06-17 MED ORDER — ARTIFICIAL TEARS OP OINT
TOPICAL_OINTMENT | OPHTHALMIC | Status: DC | PRN
  Administered 2016-06-17: 1 via OPHTHALMIC

## 2016-06-17 MED ORDER — MIDAZOLAM HCL 10 MG/2ML IJ SOLN
INTRAMUSCULAR | Status: AC
  Filled 2016-06-17: qty 2

## 2016-06-17 MED ORDER — ACETAMINOPHEN 160 MG/5ML PO SOLN: 1000.0000 mg | Freq: Four times a day (QID) | ORAL | Status: AC

## 2016-06-17 MED ORDER — BISACODYL 5 MG PO TBEC
10.0000 mg | DELAYED_RELEASE_TABLET | Freq: Every day | ORAL | Status: DC
  Administered 2016-06-18 – 2016-06-24 (×5): 10 mg via ORAL
  Filled 2016-06-17 (×6): qty 2

## 2016-06-17 MED ORDER — ONDANSETRON HCL 4 MG/2ML IJ SOLN
4.0000 mg | Freq: Four times a day (QID) | INTRAMUSCULAR | Status: DC | PRN
  Administered 2016-06-17: 4 mg via INTRAVENOUS
  Filled 2016-06-17: qty 2

## 2016-06-17 MED ORDER — MIDAZOLAM HCL 2 MG/2ML IJ SOLN
2.0000 mg | INTRAMUSCULAR | Status: DC | PRN
  Administered 2016-06-17: 2 mg via INTRAVENOUS

## 2016-06-17 MED ORDER — SODIUM CHLORIDE 0.9 % IV SOLN: INTRAVENOUS | Status: DC

## 2016-06-17 MED ORDER — INSULIN REGULAR BOLUS VIA INFUSION
0.0000 [IU] | Freq: Three times a day (TID) | INTRAVENOUS | Status: DC
  Filled 2016-06-17: qty 10

## 2016-06-17 MED ORDER — ALBUMIN HUMAN 5 % IV SOLN
250.0000 mL | INTRAVENOUS | Status: AC | PRN
  Administered 2016-06-17 (×2): 250 mL via INTRAVENOUS

## 2016-06-17 MED ORDER — HEPARIN SODIUM (PORCINE) 1000 UNIT/ML IJ SOLN
INTRAMUSCULAR | Status: AC
  Filled 2016-06-17: qty 1

## 2016-06-17 MED ORDER — ONDANSETRON HCL 4 MG/2ML IJ SOLN
INTRAMUSCULAR | Status: AC
  Filled 2016-06-17: qty 2

## 2016-06-17 MED ORDER — SODIUM CHLORIDE 0.9 % IV SOLN
INTRAVENOUS | Status: DC | PRN
  Administered 2016-06-17: 1.5 [IU]/h via INTRAVENOUS

## 2016-06-17 MED ORDER — VANCOMYCIN HCL IN DEXTROSE 1-5 GM/200ML-% IV SOLN
1000.0000 mg | Freq: Once | INTRAVENOUS | Status: AC
  Administered 2016-06-17: 1000 mg via INTRAVENOUS
  Filled 2016-06-17: qty 200

## 2016-06-17 MED ORDER — LACTATED RINGERS IV SOLN
INTRAVENOUS | Status: DC | PRN
  Administered 2016-06-17: 07:00:00 via INTRAVENOUS

## 2016-06-17 MED ORDER — MAGNESIUM SULFATE 4 GM/100ML IV SOLN
4.0000 g | Freq: Once | INTRAVENOUS | Status: AC
  Administered 2016-06-17: 4 g via INTRAVENOUS
  Filled 2016-06-17: qty 100

## 2016-06-17 MED ORDER — FENTANYL CITRATE (PF) 250 MCG/5ML IJ SOLN
INTRAMUSCULAR | Status: DC | PRN
  Administered 2016-06-17: 150 ug via INTRAVENOUS
  Administered 2016-06-17: 50 ug via INTRAVENOUS
  Administered 2016-06-17: 250 ug via INTRAVENOUS
  Administered 2016-06-17: 50 ug via INTRAVENOUS
  Administered 2016-06-17: 150 ug via INTRAVENOUS
  Administered 2016-06-17: 100 ug via INTRAVENOUS
  Administered 2016-06-17: 250 ug via INTRAVENOUS
  Administered 2016-06-17: 100 ug via INTRAVENOUS
  Administered 2016-06-17 (×2): 150 ug via INTRAVENOUS
  Administered 2016-06-17: 100 ug via INTRAVENOUS

## 2016-06-17 MED ORDER — SODIUM CHLORIDE 0.45 % IV SOLN
INTRAVENOUS | Status: DC | PRN
  Administered 2016-06-17: 20 mL/h via INTRAVENOUS

## 2016-06-17 MED ORDER — DOPAMINE-DEXTROSE 3.2-5 MG/ML-% IV SOLN
0.0000 ug/kg/min | INTRAVENOUS | Status: DC
  Filled 2016-06-17: qty 250

## 2016-06-17 MED ORDER — SODIUM CHLORIDE 0.9% FLUSH: 3.0000 mL | INTRAVENOUS | Status: DC | PRN

## 2016-06-17 MED ORDER — SODIUM CHLORIDE 0.9 % IV SOLN
30.0000 meq | Freq: Once | INTRAVENOUS | Status: AC
  Administered 2016-06-17: 30 meq via INTRAVENOUS
  Filled 2016-06-17: qty 15

## 2016-06-17 MED ORDER — SODIUM CHLORIDE 0.9% FLUSH
3.0000 mL | Freq: Two times a day (BID) | INTRAVENOUS | Status: DC
  Administered 2016-06-18 – 2016-06-24 (×5): 3 mL via INTRAVENOUS

## 2016-06-17 MED ORDER — TRANEXAMIC ACID (OHS) PUMP PRIME SOLUTION
2.0000 mg/kg | INTRAVENOUS | Status: DC
  Filled 2016-06-17: qty 1.72

## 2016-06-17 MED ORDER — FAMOTIDINE IN NACL 20-0.9 MG/50ML-% IV SOLN: 20.0000 mg | Freq: Two times a day (BID) | INTRAVENOUS | Status: DC

## 2016-06-17 MED ORDER — DEXMEDETOMIDINE HCL IN NACL 200 MCG/50ML IV SOLN
INTRAVENOUS | Status: AC
Start: 2016-06-17 — End: 2016-06-17
  Filled 2016-06-17: qty 50

## 2016-06-17 MED ORDER — SODIUM CHLORIDE 0.9 % IV SOLN
INTRAVENOUS | Status: AC
  Filled 2016-06-17: qty 2.5

## 2016-06-17 MED ORDER — PROTAMINE SULFATE 10 MG/ML IV SOLN
INTRAVENOUS | Status: DC | PRN
  Administered 2016-06-17: 260 mg via INTRAVENOUS

## 2016-06-17 MED ORDER — SODIUM CHLORIDE 0.9 % IV SOLN: 250.0000 mL | INTRAVENOUS | Status: DC

## 2016-06-17 MED ORDER — HEMOSTATIC AGENTS (NO CHARGE) OPTIME
TOPICAL | Status: DC | PRN
  Administered 2016-06-17 (×2): 1 via TOPICAL

## 2016-06-17 MED ORDER — CEFUROXIME SODIUM 750 MG IJ SOLR
750.0000 mg | INTRAMUSCULAR | Status: DC
  Filled 2016-06-17: qty 750

## 2016-06-17 MED ORDER — METOPROLOL TARTRATE 5 MG/5ML IV SOLN: 2.5000 mg | INTRAVENOUS | Status: DC | PRN

## 2016-06-17 MED ORDER — TRAMADOL HCL 50 MG PO TABS
50.0000 mg | ORAL_TABLET | ORAL | Status: DC | PRN
  Administered 2016-06-18: 100 mg via ORAL
  Administered 2016-06-23: 50 mg via ORAL
  Administered 2016-06-23 (×2): 100 mg via ORAL
  Filled 2016-06-17 (×4): qty 2

## 2016-06-17 MED ORDER — PHENYLEPHRINE HCL 10 MG/ML IJ SOLN
0.0000 ug/min | INTRAVENOUS | Status: DC
  Filled 2016-06-17: qty 2

## 2016-06-17 MED ORDER — METOPROLOL TARTRATE 25 MG/10 ML ORAL SUSPENSION: 12.5000 mg | Freq: Two times a day (BID) | ORAL | Status: DC

## 2016-06-17 MED ORDER — ACETAMINOPHEN 500 MG PO TABS
1000.0000 mg | ORAL_TABLET | Freq: Four times a day (QID) | ORAL | Status: AC
  Administered 2016-06-17 – 2016-06-22 (×17): 1000 mg via ORAL
  Filled 2016-06-17 (×20): qty 2

## 2016-06-17 MED ORDER — MIDAZOLAM HCL 2 MG/2ML IJ SOLN
INTRAMUSCULAR | Status: AC
  Filled 2016-06-17: qty 2

## 2016-06-17 MED ORDER — FENTANYL CITRATE (PF) 100 MCG/2ML IJ SOLN
INTRAMUSCULAR | Status: AC
  Administered 2016-06-17: 100 ug
  Filled 2016-06-17: qty 2

## 2016-06-17 MED ORDER — ACETAMINOPHEN 160 MG/5ML PO SOLN: 650.0000 mg | Freq: Once | ORAL | Status: AC

## 2016-06-17 MED ORDER — MORPHINE SULFATE (PF) 2 MG/ML IV SOLN
2.0000 mg | INTRAVENOUS | Status: DC | PRN
  Administered 2016-06-17 (×3): 2 mg via INTRAVENOUS
  Filled 2016-06-17 (×4): qty 1

## 2016-06-17 MED ORDER — DOCUSATE SODIUM 100 MG PO CAPS
200.0000 mg | ORAL_CAPSULE | Freq: Every day | ORAL | Status: DC
  Administered 2016-06-18 – 2016-06-24 (×5): 200 mg via ORAL
  Filled 2016-06-17 (×6): qty 2

## 2016-06-17 MED ORDER — MIDAZOLAM HCL 5 MG/5ML IJ SOLN
INTRAMUSCULAR | Status: DC | PRN
  Administered 2016-06-17 (×2): 2 mg via INTRAVENOUS
  Administered 2016-06-17 (×2): 3 mg via INTRAVENOUS

## 2016-06-17 MED ORDER — BISACODYL 10 MG RE SUPP
10.0000 mg | Freq: Every day | RECTAL | Status: DC
Start: 2016-06-18 — End: 2016-06-24

## 2016-06-17 MED ORDER — ORAL CARE MOUTH RINSE
15.0000 mL | Freq: Two times a day (BID) | OROMUCOSAL | Status: DC
  Administered 2016-06-17 – 2016-06-18 (×2): 15 mL via OROMUCOSAL

## 2016-06-17 MED ORDER — METOPROLOL TARTRATE 12.5 MG HALF TABLET
12.5000 mg | ORAL_TABLET | Freq: Two times a day (BID) | ORAL | Status: DC
  Administered 2016-06-17 – 2016-06-21 (×8): 12.5 mg via ORAL
  Filled 2016-06-17 (×8): qty 1

## 2016-06-17 MED ORDER — TRANEXAMIC ACID (OHS) BOLUS VIA INFUSION
15.0000 mg/kg | INTRAVENOUS | Status: AC
  Administered 2016-06-17: 1293 mg via INTRAVENOUS
  Filled 2016-06-17: qty 1293

## 2016-06-17 MED ORDER — METOPROLOL TARTRATE 12.5 MG HALF TABLET
12.5000 mg | ORAL_TABLET | Freq: Once | ORAL | Status: DC
  Filled 2016-06-17: qty 1

## 2016-06-17 MED ORDER — PANTOPRAZOLE SODIUM 40 MG PO TBEC
40.0000 mg | DELAYED_RELEASE_TABLET | Freq: Every day | ORAL | Status: DC
Start: 2016-06-19 — End: 2016-06-24
  Administered 2016-06-19 – 2016-06-24 (×6): 40 mg via ORAL
  Filled 2016-06-17 (×6): qty 1

## 2016-06-17 MED ORDER — NITROGLYCERIN IN D5W 200-5 MCG/ML-% IV SOLN: 0.0000 ug/min | INTRAVENOUS | Status: DC

## 2016-06-17 MED ORDER — PHENYLEPHRINE HCL 10 MG/ML IJ SOLN
INTRAMUSCULAR | Status: DC | PRN
  Administered 2016-06-17: 10 ug/min via INTRAVENOUS

## 2016-06-17 SURGICAL SUPPLY — 98 items
ADAPTER CARDIO PERF ANTE/RETRO (ADAPTER) ×4 IMPLANT
BAG DECANTER FOR FLEXI CONT (MISCELLANEOUS) ×4 IMPLANT
BANDAGE ACE 4X5 VEL STRL LF (GAUZE/BANDAGES/DRESSINGS) ×4 IMPLANT
BANDAGE ACE 6X5 VEL STRL LF (GAUZE/BANDAGES/DRESSINGS) ×4 IMPLANT
BASKET HEART  (ORDER IN 25'S) (MISCELLANEOUS) ×1
BASKET HEART (ORDER IN 25'S) (MISCELLANEOUS) ×1
BASKET HEART (ORDER IN 25S) (MISCELLANEOUS) ×2 IMPLANT
BLADE STERNUM SYSTEM 6 (BLADE) ×4 IMPLANT
BLADE SURG 12 STRL SS (BLADE) ×4 IMPLANT
BLADE SURG ROTATE 9660 (MISCELLANEOUS) IMPLANT
BNDG GAUZE ELAST 4 BULKY (GAUZE/BANDAGES/DRESSINGS) ×4 IMPLANT
CANISTER SUCTION 2500CC (MISCELLANEOUS) ×4 IMPLANT
CANNULA GUNDRY RCSP 15FR (MISCELLANEOUS) ×4 IMPLANT
CATH CPB KIT VANTRIGT (MISCELLANEOUS) ×4 IMPLANT
CATH ROBINSON RED A/P 18FR (CATHETERS) ×12 IMPLANT
CATH THORACIC 36FR RT ANG (CATHETERS) ×4 IMPLANT
CLIP TI WIDE RED SMALL 24 (CLIP) ×4 IMPLANT
CRADLE DONUT ADULT HEAD (MISCELLANEOUS) ×4 IMPLANT
DRAIN CHANNEL 15F RND FF W/TCR (WOUND CARE) ×4 IMPLANT
DRAIN CHANNEL 32F RND 10.7 FF (WOUND CARE) ×4 IMPLANT
DRAPE CARDIOVASCULAR INCISE (DRAPES) ×2
DRAPE SLUSH/WARMER DISC (DRAPES) ×4 IMPLANT
DRAPE SRG 135X102X78XABS (DRAPES) ×2 IMPLANT
DRSG AQUACEL AG ADV 3.5X14 (GAUZE/BANDAGES/DRESSINGS) ×4 IMPLANT
ELECT BLADE 4.0 EZ CLEAN MEGAD (MISCELLANEOUS) ×4
ELECT BLADE 6.5 EXT (BLADE) ×4 IMPLANT
ELECT CAUTERY BLADE 6.4 (BLADE) ×4 IMPLANT
ELECT REM PT RETURN 9FT ADLT (ELECTROSURGICAL) ×8
ELECTRODE BLDE 4.0 EZ CLN MEGD (MISCELLANEOUS) ×2 IMPLANT
ELECTRODE REM PT RTRN 9FT ADLT (ELECTROSURGICAL) ×4 IMPLANT
EVACUATOR SILICONE 100CC (DRAIN) ×4 IMPLANT
FELT TEFLON 1X6 (MISCELLANEOUS) ×4 IMPLANT
GAUZE SPONGE 4X4 12PLY STRL (GAUZE/BANDAGES/DRESSINGS) ×8 IMPLANT
GLOVE BIO SURGEON STRL SZ 6.5 (GLOVE) ×6 IMPLANT
GLOVE BIO SURGEON STRL SZ7.5 (GLOVE) ×16 IMPLANT
GLOVE BIO SURGEONS STRL SZ 6.5 (GLOVE) ×2
GLOVE BIOGEL PI IND STRL 6.5 (GLOVE) ×4 IMPLANT
GLOVE BIOGEL PI IND STRL 7.0 (GLOVE) ×6 IMPLANT
GLOVE BIOGEL PI IND STRL 8.5 (GLOVE) ×4 IMPLANT
GLOVE BIOGEL PI INDICATOR 6.5 (GLOVE) ×4
GLOVE BIOGEL PI INDICATOR 7.0 (GLOVE) ×6
GLOVE BIOGEL PI INDICATOR 8.5 (GLOVE) ×4
GOWN STRL REUS W/ TWL LRG LVL3 (GOWN DISPOSABLE) ×20 IMPLANT
GOWN STRL REUS W/TWL LRG LVL3 (GOWN DISPOSABLE) ×20
HEMOSTAT POWDER SURGIFOAM 1G (HEMOSTASIS) ×12 IMPLANT
HEMOSTAT SURGICEL 2X14 (HEMOSTASIS) ×4 IMPLANT
INSERT FOGARTY XLG (MISCELLANEOUS) IMPLANT
KIT BASIN OR (CUSTOM PROCEDURE TRAY) ×4 IMPLANT
KIT ROOM TURNOVER OR (KITS) ×4 IMPLANT
KIT SUCTION CATH 14FR (SUCTIONS) ×4 IMPLANT
KIT VASOVIEW HEMOPRO VH 3000 (KITS) ×4 IMPLANT
LEAD PACING MYOCARDI (MISCELLANEOUS) ×4 IMPLANT
MARKER GRAFT CORONARY BYPASS (MISCELLANEOUS) ×12 IMPLANT
NS IRRIG 1000ML POUR BTL (IV SOLUTION) ×24 IMPLANT
PACK OPEN HEART (CUSTOM PROCEDURE TRAY) ×4 IMPLANT
PAD ARMBOARD 7.5X6 YLW CONV (MISCELLANEOUS) ×8 IMPLANT
PAD CARDIAC INSULATION (MISCELLANEOUS) ×4 IMPLANT
PAD ELECT DEFIB RADIOL ZOLL (MISCELLANEOUS) ×4 IMPLANT
PENCIL BUTTON HOLSTER BLD 10FT (ELECTRODE) ×4 IMPLANT
PUNCH AORTIC ROTATE  4.5MM 8IN (MISCELLANEOUS) ×4 IMPLANT
PUNCH AORTIC ROTATE 4.0MM (MISCELLANEOUS) IMPLANT
PUNCH AORTIC ROTATE 4.5MM 8IN (MISCELLANEOUS) IMPLANT
PUNCH AORTIC ROTATE 5MM 8IN (MISCELLANEOUS) IMPLANT
SENSOR MYOCARDIAL TEMP (MISCELLANEOUS) ×4 IMPLANT
SET CARDIOPLEGIA MPS 5001102 (MISCELLANEOUS) ×4 IMPLANT
SURGIFLO W/THROMBIN 8M KIT (HEMOSTASIS) ×4 IMPLANT
SUT BONE WAX W31G (SUTURE) ×4 IMPLANT
SUT MNCRL AB 4-0 PS2 18 (SUTURE) IMPLANT
SUT PROLENE 3 0 SH DA (SUTURE) IMPLANT
SUT PROLENE 3 0 SH1 36 (SUTURE) IMPLANT
SUT PROLENE 4 0 RB 1 (SUTURE) ×2
SUT PROLENE 4 0 SH DA (SUTURE) ×4 IMPLANT
SUT PROLENE 4-0 RB1 .5 CRCL 36 (SUTURE) ×2 IMPLANT
SUT PROLENE 5 0 C 1 36 (SUTURE) IMPLANT
SUT PROLENE 6 0 C 1 30 (SUTURE) ×8 IMPLANT
SUT PROLENE 6 0 CC (SUTURE) ×24 IMPLANT
SUT PROLENE 8 0 BV175 6 (SUTURE) IMPLANT
SUT PROLENE BLUE 7 0 (SUTURE) ×4 IMPLANT
SUT SILK  1 MH (SUTURE)
SUT SILK 1 MH (SUTURE) IMPLANT
SUT SILK 2 0 SH CR/8 (SUTURE) ×4 IMPLANT
SUT SILK 3 0 SH CR/8 (SUTURE) IMPLANT
SUT STEEL 6MS V (SUTURE) ×8 IMPLANT
SUT STEEL SZ 6 DBL 3X14 BALL (SUTURE) ×4 IMPLANT
SUT VIC AB 1 CTX 36 (SUTURE) ×6
SUT VIC AB 1 CTX36XBRD ANBCTR (SUTURE) ×6 IMPLANT
SUT VIC AB 2-0 CT1 27 (SUTURE) ×12
SUT VIC AB 2-0 CT1 TAPERPNT 27 (SUTURE) ×12 IMPLANT
SUT VIC AB 2-0 CTX 27 (SUTURE) IMPLANT
SUT VIC AB 3-0 X1 27 (SUTURE) ×24 IMPLANT
SUTURE E-PAK OPEN HEART (SUTURE) ×4 IMPLANT
SYSTEM SAHARA CHEST DRAIN ATS (WOUND CARE) ×4 IMPLANT
TOWEL OR 17X24 6PK STRL BLUE (TOWEL DISPOSABLE) ×8 IMPLANT
TOWEL OR 17X26 10 PK STRL BLUE (TOWEL DISPOSABLE) ×8 IMPLANT
TRAY FOLEY IC TEMP SENS 16FR (CATHETERS) ×4 IMPLANT
TUBING INSUFFLATION (TUBING) ×4 IMPLANT
UNDERPAD 30X30 (UNDERPADS AND DIAPERS) ×4 IMPLANT
WATER STERILE IRR 1000ML POUR (IV SOLUTION) ×8 IMPLANT

## 2016-06-17 NOTE — Anesthesia Procedure Notes (Addendum)
Central Venous Catheter Insertion Performed by: Roberts Gaudy, anesthesiologist Start/End12/19/2017 7:10 AM, 06/17/2016 7:15 AM Patient location: Pre-op. Preanesthetic checklist: patient identified, IV checked, site marked, risks and benefits discussed, surgical consent, monitors and equipment checked, pre-op evaluation, timeout performed and anesthesia consent Position: Trendelenburg Hand hygiene performed  and maximum sterile barriers used  Catheter size: 8.5 Fr PA cath was placed.Swan type:thermodilution Procedure performed using ultrasound guided technique. Ultrasound Notes:anatomy identified, needle tip was noted to be adjacent to the nerve/plexus identified, no ultrasound evidence of intravascular and/or intraneural injection and image(s) printed for medical record Attempts: 1 Following insertion, line sutured, dressing applied and Biopatch. Post procedure assessment: blood return through all ports, free fluid flow and no air  Patient tolerated the procedure well with no immediate complications.

## 2016-06-17 NOTE — Anesthesia Procedure Notes (Signed)
Procedure Name: Intubation Date/Time: 06/17/2016 7:53 AM Performed by: Rejeana Brock L Pre-anesthesia Checklist: Patient identified, Emergency Drugs available, Suction available and Patient being monitored Patient Re-evaluated:Patient Re-evaluated prior to inductionOxygen Delivery Method: Circle System Utilized Preoxygenation: Pre-oxygenation with 100% oxygen Intubation Type: IV induction Ventilation: Mask ventilation without difficulty Laryngoscope Size: Mac and 3 Grade View: Grade I Tube type: Oral Tube size: 8.0 mm Number of attempts: 1 Airway Equipment and Method: Stylet and Oral airway Placement Confirmation: ETT inserted through vocal cords under direct vision,  positive ETCO2 and breath sounds checked- equal and bilateral Secured at: 22 cm Tube secured with: Tape Dental Injury: Teeth and Oropharynx as per pre-operative assessment

## 2016-06-17 NOTE — Care Management Note (Signed)
Case Management Note  Patient Details  Name: Donna Woodward MRN: [id number] Date of Birth: [birth year]  Subjective/Objective:    Pt is s/p CABG                Action/Plan:  Pt in on ventilator.  CM will continue to follow for discharge plan   Expected Discharge Date:                  Expected Discharge Plan:     In-House Referral:     Discharge planning Services  CM Consult  Post Acute Care Choice:    Choice offered to:     DME Arranged:    DME Agency:     HH Arranged:    HH Agency:     Status of Service:  In process, will continue to follow  If discussed at Long Length of Stay Meetings, dates discussed:    Additional Comments:  Maryclare Labrador, RN 06/17/2016, 4:15 PM

## 2016-06-17 NOTE — Progress Notes (Signed)
Rapid wean protocol initiated. 

## 2016-06-17 NOTE — OR Nursing (Signed)
Forty-five minute call to SICU charge nurse at 1204. Twenty minute call to SICU charge nurse at 1242. Spoke to Nara Visa.

## 2016-06-17 NOTE — Progress Notes (Signed)
Rapid wean protocol initiated for second time.

## 2016-06-17 NOTE — Anesthesia Postprocedure Evaluation (Signed)
Anesthesia Post Note  Patient: Donna Woodward  Procedure(s) Performed: Procedure(s) (LRB): CORONARY ARTERY BYPASS GRAFTING (CABG), ON PUMP, TIMES THREE, USING LEFT INTERNAL MAMMARY ARTERY, RIGHT GREATER SAPHENOUS VEIN HARVESTED ENDOSCOPICALLY (N/A) TRANSESOPHAGEAL ECHOCARDIOGRAM (TEE) (N/A)  Patient location during evaluation: SICU Anesthesia Type: General Level of consciousness: sedated and patient remains intubated per anesthesia plan Pain management: pain level controlled Vital Signs Assessment: post-procedure vital signs reviewed and stable Respiratory status: patient remains intubated per anesthesia plan Cardiovascular status: stable Anesthetic complications: no       Last Vitals:  Vitals:   06/17/16 1545 06/17/16 1600  BP:  (!) 87/60  Pulse: 89 88  Resp: (!) 23 14  Temp: 36.3 C 36.4 C    Last Pain:  Vitals:   06/17/16 1330  TempSrc: Core (Comment)                 Nolon Nations

## 2016-06-17 NOTE — Transfer of Care (Signed)
Immediate Anesthesia Transfer of Care Note  Patient: Donna Woodward  Procedure(s) Performed: Procedure(s): CORONARY ARTERY BYPASS GRAFTING (CABG), ON PUMP, TIMES THREE, USING LEFT INTERNAL MAMMARY ARTERY, RIGHT GREATER SAPHENOUS VEIN HARVESTED ENDOSCOPICALLY (N/A) TRANSESOPHAGEAL ECHOCARDIOGRAM (TEE) (N/A)  Patient Location: PACU  Anesthesia Type:General  Level of Consciousness: Patient remains intubated per anesthesia plan  Airway & Oxygen Therapy: Patient remains intubated per anesthesia plan and Patient placed on Ventilator (see vital sign flow sheet for setting)  Post-op Assessment: Report given to RN and Post -op Vital signs reviewed and stable  Post vital signs: Reviewed and stable  Last Vitals:  Vitals:   06/17/16 0554 06/17/16 1323  BP: 140/68 (!) 105/59  Pulse: 65 74  Resp: 20 12  Temp: 36.6 C     Last Pain:  Vitals:   06/17/16 0554  TempSrc: Oral      Patients Stated Pain Goal: 5 ([id number])  Complications: No apparent anesthesia complications

## 2016-06-17 NOTE — Progress Notes (Signed)
Patient continuously alarming low respiratory rate and minute ventilation.  Patient still partially sedated.  Will attempt again once patient more awake.

## 2016-06-17 NOTE — Progress Notes (Signed)
Patient ID: Donna Woodward, female   DOB: 1956/04/29, 60 y.o.   MRN: [id number] EVENING ROUNDS NOTE :     [facility]       [place] [address]             [phone]                 Day of Surgery Procedure(s) (LRB): CORONARY ARTERY BYPASS GRAFTING (CABG), ON PUMP, TIMES THREE, USING LEFT INTERNAL MAMMARY ARTERY, RIGHT GREATER SAPHENOUS VEIN HARVESTED ENDOSCOPICALLY (N/A) TRANSESOPHAGEAL ECHOCARDIOGRAM (TEE) (N/A)  Total Length of Stay:  LOS: 0 days  BP (!) 99/57   Pulse 88   Temp 98.2 F (36.8 C)   Resp (!) 21   Ht 5\' 5"  (1.651 m)   Wt 182 lb 9 oz (82.8 kg)   SpO2 92%   BMI 30.38 kg/m   .Intake/Output      12/18 0701 - 12/19 0700 12/19 0701 - 12/20 0700   I.V. (mL/kg)  2040.9 (24.6)   Blood  425   NG/GT  30   IV Piggyback  615   Total Intake(mL/kg)  3110.9 (37.6)   Urine (mL/kg/hr)  1975 (2.2)   Blood  1000 (1.1)   Chest Tube  85 (0.1)   Total Output   3060   Net   +50.9          . sodium chloride 20 mL/hr (06/17/16 1320)  . [START ON 06/18/2016] sodium chloride    . sodium chloride 20 mL/hr (06/17/16 1320)  . dexmedetomidine Stopped (06/17/16 1515)  . DOPamine Stopped (06/17/16 1315)  . insulin (NOVOLIN-R) infusion 2.2 Units/hr (06/17/16 1700)  . lactated ringers 20 mL/hr (06/17/16 1320)  . lactated ringers 20 mL/hr (06/17/16 1315)  . nitroGLYCERIN Stopped (06/17/16 1400)  . phenylephrine (NEO-SYNEPHRINE) Adult infusion 5 mcg/min (06/17/16 1600)     Lab Results  Component Value Date   WBC 18.4 (H) 06/17/2016   HGB 10.5 (L) 06/17/2016   HCT 31.0 (L) 06/17/2016   PLT 155 06/17/2016   GLUCOSE 124 (H) 06/17/2016   CHOL 171 11/06/2015   TRIG 129 11/06/2015   HDL 33 (L) 11/06/2015   LDLCALC 112 11/06/2015   ALT 19 06/16/2016   AST 16 06/16/2016   NA 145 06/17/2016   K 2.7 (LL) 06/17/2016   CL 100 (L) 06/17/2016   CREATININE 0.50 06/17/2016   BUN 7 06/17/2016   CO2 24 06/16/2016   INR 1.23 06/17/2016   HGBA1C 5.5 06/16/2016   Now  extubated  Stable  k being replaced   Grace Isaac MD  Beeper [phone] Office [phone] 06/17/2016 6:00 PM

## 2016-06-17 NOTE — Brief Op Note (Signed)
06/17/2016  12:21 PM  PATIENT:  Donna Woodward  60 y.o. female  PRE-OPERATIVE DIAGNOSIS:  CAD  POST-OPERATIVE DIAGNOSIS:  CAD  PROCEDURE:  Procedure(s): CORONARY ARTERY BYPASS GRAFTING (CABG), ON PUMP, TIMES THREE, USING LEFT INTERNAL MAMMARY ARTERY, RIGHT GREATER SAPHENOUS VEIN HARVESTED ENDOSCOPICALLY (N/A) TRANSESOPHAGEAL ECHOCARDIOGRAM (TEE) (N/A) LIMA-LAD SVG-DIAG SVG-PD RIGHT LEG OPEN VEIN HARVEST  SURGEON:  Surgeon(s) and Role:    * Ivin Poot, MD - Primary  PHYSICIAN ASSISTANT: Abdurahman Rugg PA-C  ANESTHESIA:   general  EBL:  Total I/O In: 425 [Blood:425] Out: 575 [Urine:575]  BLOOD ADMINISTERED:none  DRAINS: ROUTINE   LOCAL MEDICATIONS USED:  NONE  SPECIMEN:  No Specimen  DISPOSITION OF SPECIMEN:  N/A  COUNTS:  YES  TOURNIQUET:  * No tourniquets in log *  DICTATION: .Other Dictation: Dictation Number PENDING  PLAN OF CARE: Admit to inpatient   PATIENT DISPOSITION:  ICU - intubated and hemodynamically stable.   Delay start of Pharmacological VTE agent (>24hrs) due to surgical blood loss or risk of bleeding: yes  COMPLICATIONS: NO KNOWN

## 2016-06-17 NOTE — Procedures (Signed)
Extubation Procedure Note  Patient Details:   Name: Donna Woodward DOB: March 23, 1956 MRN: [id number]   Airway Documentation:     Evaluation  O2 sats: stable throughout Complications: No apparent complications Patient did tolerate procedure well. Bilateral Breath Sounds: Clear, Diminished   Yes   Patient extubated to 2L nasal cannula per Rapid wean protocol.  Positive cuff leak noted.  No evidence of stridor.  Patient able to speak post extubation.  Sats currently 97%.  Vitals are stable.  NIF of -24, VC of 900.  No complications noted.  [name] 06/17/2016, 5:29 PM

## 2016-06-18 ENCOUNTER — Encounter (HOSPITAL_COMMUNITY): Payer: Self-pay | Admitting: Cardiothoracic Surgery

## 2016-06-18 ENCOUNTER — Inpatient Hospital Stay (HOSPITAL_COMMUNITY): Payer: Medicare Other

## 2016-06-18 ENCOUNTER — Ambulatory Visit: Payer: Medicare Other | Admitting: Family Medicine

## 2016-06-18 LAB — GLUCOSE, CAPILLARY
Glucose-Capillary: 102 mg/dL — ABNORMAL HIGH (ref 65–99)
Glucose-Capillary: 106 mg/dL — ABNORMAL HIGH (ref 65–99)
Glucose-Capillary: 109 mg/dL — ABNORMAL HIGH (ref 65–99)
Glucose-Capillary: 109 mg/dL — ABNORMAL HIGH (ref 65–99)
Glucose-Capillary: 111 mg/dL — ABNORMAL HIGH (ref 65–99)
Glucose-Capillary: 111 mg/dL — ABNORMAL HIGH (ref 65–99)
Glucose-Capillary: 115 mg/dL — ABNORMAL HIGH (ref 65–99)
Glucose-Capillary: 117 mg/dL — ABNORMAL HIGH (ref 65–99)
Glucose-Capillary: 120 mg/dL — ABNORMAL HIGH (ref 65–99)
Glucose-Capillary: 124 mg/dL — ABNORMAL HIGH (ref 65–99)
Glucose-Capillary: 124 mg/dL — ABNORMAL HIGH (ref 65–99)
Glucose-Capillary: 132 mg/dL — ABNORMAL HIGH (ref 65–99)
Glucose-Capillary: 132 mg/dL — ABNORMAL HIGH (ref 65–99)
Glucose-Capillary: 145 mg/dL — ABNORMAL HIGH (ref 65–99)
Glucose-Capillary: 91 mg/dL (ref 65–99)
Glucose-Capillary: 98 mg/dL (ref 65–99)

## 2016-06-18 LAB — CREATININE, SERUM: CREATININE: 0.72 mg/dL (ref 0.44–1.00)

## 2016-06-18 LAB — POCT I-STAT, CHEM 8
BUN: 12 mg/dL (ref 6–20)
Calcium, Ion: 1.19 mmol/L (ref 1.15–1.40)
Chloride: 99 mmol/L — ABNORMAL LOW (ref 101–111)
Creatinine, Ser: 0.7 mg/dL (ref 0.44–1.00)
Glucose, Bld: 128 mg/dL — ABNORMAL HIGH (ref 65–99)
HCT: 27 % — ABNORMAL LOW (ref 36.0–46.0)
Hemoglobin: 9.2 g/dL — ABNORMAL LOW (ref 12.0–15.0)
Potassium: 3.5 mmol/L (ref 3.5–5.1)
Sodium: 140 mmol/L (ref 135–145)
TCO2: 28 mmol/L (ref 0–100)

## 2016-06-18 LAB — BASIC METABOLIC PANEL
ANION GAP: 6 (ref 5–15)
BUN: 7 mg/dL (ref 6–20)
CALCIUM: 7.7 mg/dL — AB (ref 8.9–10.3)
CO2: 24 mmol/L (ref 22–32)
Chloride: 110 mmol/L (ref 101–111)
Creatinine, Ser: 0.59 mg/dL (ref 0.44–1.00)
Glucose, Bld: 109 mg/dL — ABNORMAL HIGH (ref 65–99)
Potassium: 3.4 mmol/L — ABNORMAL LOW (ref 3.5–5.1)
Sodium: 140 mmol/L (ref 135–145)

## 2016-06-18 LAB — CBC
HCT: 31.6 % — ABNORMAL LOW (ref 36.0–46.0)
HEMATOCRIT: 31.3 % — AB (ref 36.0–46.0)
Hemoglobin: 10.4 g/dL — ABNORMAL LOW (ref 12.0–15.0)
Hemoglobin: 10.2 g/dL — ABNORMAL LOW (ref 12.0–15.0)
MCH: 28.6 pg (ref 26.0–34.0)
MCH: 28.3 pg (ref 26.0–34.0)
MCHC: 33.2 g/dL (ref 30.0–36.0)
MCHC: 32.3 g/dL (ref 30.0–36.0)
MCV: 86 fL (ref 78.0–100.0)
MCV: 87.5 fL (ref 78.0–100.0)
PLATELETS: 139 10*3/uL — AB (ref 150–400)
Platelets: 139 10*3/uL — ABNORMAL LOW (ref 150–400)
RBC: 3.64 MIL/uL — ABNORMAL LOW (ref 3.87–5.11)
RBC: 3.61 MIL/uL — ABNORMAL LOW (ref 3.87–5.11)
RDW: 14.7 % (ref 11.5–15.5)
RDW: 15.3 % (ref 11.5–15.5)
WBC: 16.2 10*3/uL — AB (ref 4.0–10.5)
WBC: 17 10*3/uL — AB (ref 4.0–10.5)

## 2016-06-18 LAB — MAGNESIUM
MAGNESIUM: 2.1 mg/dL (ref 1.7–2.4)
Magnesium: 2.3 mg/dL (ref 1.7–2.4)

## 2016-06-18 MED ORDER — SODIUM CHLORIDE 0.9 % IV SOLN
30.0000 meq | Freq: Once | INTRAVENOUS | Status: AC
  Administered 2016-06-18: 30 meq via INTRAVENOUS
  Filled 2016-06-18: qty 15

## 2016-06-18 MED ORDER — POTASSIUM CHLORIDE 20 MEQ/15ML (10%) PO SOLN
20.0000 meq | Freq: Four times a day (QID) | ORAL | Status: AC
  Administered 2016-06-19: 20 meq via ORAL
  Filled 2016-06-18: qty 15

## 2016-06-18 MED ORDER — FUROSEMIDE 10 MG/ML IJ SOLN
40.0000 mg | Freq: Every day | INTRAMUSCULAR | Status: AC
  Administered 2016-06-18 – 2016-06-19 (×2): 40 mg via INTRAVENOUS
  Filled 2016-06-18: qty 4

## 2016-06-18 MED ORDER — KETOROLAC TROMETHAMINE 15 MG/ML IJ SOLN
15.0000 mg | Freq: Four times a day (QID) | INTRAMUSCULAR | Status: AC
  Administered 2016-06-18 – 2016-06-20 (×8): 15 mg via INTRAVENOUS
  Filled 2016-06-18 (×8): qty 1

## 2016-06-18 MED ORDER — POTASSIUM CHLORIDE CRYS ER 20 MEQ PO TBCR
20.0000 meq | EXTENDED_RELEASE_TABLET | Freq: Four times a day (QID) | ORAL | Status: AC
  Administered 2016-06-18: 20 meq via ORAL
  Filled 2016-06-18 (×2): qty 1

## 2016-06-18 MED ORDER — INSULIN DETEMIR 100 UNIT/ML ~~LOC~~ SOLN
10.0000 [IU] | Freq: Two times a day (BID) | SUBCUTANEOUS | Status: DC
  Administered 2016-06-18 – 2016-06-20 (×5): 10 [IU] via SUBCUTANEOUS
  Filled 2016-06-18 (×8): qty 0.1

## 2016-06-18 MED ORDER — INSULIN ASPART 100 UNIT/ML ~~LOC~~ SOLN
0.0000 [IU] | SUBCUTANEOUS | Status: DC
  Administered 2016-06-18 – 2016-06-19 (×6): 2 [IU] via SUBCUTANEOUS

## 2016-06-18 NOTE — Progress Notes (Signed)
1 Day Post-Op Procedure(s) (LRB): CORONARY ARTERY BYPASS GRAFTING (CABG), ON PUMP, TIMES THREE, USING LEFT INTERNAL MAMMARY ARTERY, RIGHT GREATER SAPHENOUS VEIN HARVESTED ENDOSCOPICALLY (N/A) TRANSESOPHAGEAL ECHOCARDIOGRAM (TEE) (N/A) Subjective: Moderate sternal incisional pain nsr cxr clear Objective: Vital signs in last 24 hours: Temp:  [96.8 F (36 C)-99.9 F (37.7 C)] 98.8 F (37.1 C) (12/20 0900) Pulse Rate:  [63-89] 70 (12/20 0900) Cardiac Rhythm: Atrial paced (12/20 0400) Resp:  [12-31] 23 (12/20 0900) BP: (80-120)/(44-75) 89/57 (12/20 0900) SpO2:  [91 %-100 %] 93 % (12/20 0900) Arterial Line BP: (96-208)/(43-200) 112/49 (12/20 0900) FiO2 (%):  [40 %-50 %] 40 % (12/19 1657) Weight:  [190 lb 14.7 oz (86.6 kg)] 190 lb 14.7 oz (86.6 kg) (12/20 0500)  Hemodynamic parameters for last 24 hours: PAP: (22-38)/(11-23) 27/11 CO:  [3.5 L/min-6 L/min] 3.5 L/min CI:  [1.8 L/min/m2-3.1 L/min/m2] 1.8 L/min/m2  Intake/Output from previous day: 12/19 0701 - 12/20 0700 In: 4986.3 [I.V.:2886.3; Blood:425; NG/GT:30; IV Piggyback:1645] Out: [id number] [Urine:2705; Drains:35; Blood:1000; Chest Tube:409] Intake/Output this shift: Total I/O In: 5.7 [I.V.:5.7] Out: 130 [Urine:60; Chest Tube:70]       Exam    General- alert and comfortable   Lungs- clear without rales, wheezes   Cor- regular rate and rhythm, no murmur , gallop   Abdomen- soft, non-tender   Extremities - warm, non-tender, minimal edema   Neuro- oriented, appropriate, no focal weakness   Lab Results:  Recent Labs  06/17/16 1920 06/18/16 0352  WBC 20.2* 16.2*  HGB 11.0* 10.4*  HCT 32.8* 31.3*  PLT 165 139*   BMET:  Recent Labs  06/16/16 1522  06/17/16 1214 06/17/16 1331 06/17/16 1920 06/18/16 0352  NA 140  < > 140 145  --  140  K 3.2*  < > 3.2* 2.7*  --  3.4*  CL 104  < > 100*  --   --  110  CO2 24  --   --   --   --  24  GLUCOSE 99  < > 173* 124*  --  109*  BUN 10  < > 7  --   --  7  CREATININE 0.68  < >  0.50  --  0.53 0.59  CALCIUM 9.5  --   --   --   --  7.7*  < > = values in this interval not displayed.  PT/INR:  Recent Labs  06/17/16 1325  LABPROT 15.6*  INR 1.23   ABG    Component Value Date/Time   PHART 7.372 06/17/2016 1716   HCO3 25.0 06/17/2016 1716   TCO2 26 06/17/2016 1716   O2SAT 97.0 06/17/2016 1716   CBG (last 3)   Recent Labs  06/17/16 2208 06/17/16 2256 06/18/16 0003  GLUCAP 120* 119* 111*    Assessment/Plan: S/P Procedure(s) (LRB): CORONARY ARTERY BYPASS GRAFTING (CABG), ON PUMP, TIMES THREE, USING LEFT INTERNAL MAMMARY ARTERY, RIGHT GREATER SAPHENOUS VEIN HARVESTED ENDOSCOPICALLY (N/A) TRANSESOPHAGEAL ECHOCARDIOGRAM (TEE) (N/A) Mobilize Diuresis Diabetes control d/c pacing wires See progression orders   LOS: 1 day    Tharon Aquas Trigt III 06/18/2016

## 2016-06-18 NOTE — Op Note (Signed)
NAME:  Donna Woodward, Donna Woodward NO.:  MEDICAL RECORD NO.:  [id number]  LOCATION:                                 FACILITY:  PHYSICIAN:  Ivin Poot, M.D.  DATE OF BIRTH:  11/09/1955  DATE OF PROCEDURE:  06/17/2016 DATE OF DISCHARGE:                              OPERATIVE REPORT   OPERATION: 1. Coronary artery bypass grafting x3 (left internal mammary artery to     LAD, saphenous vein graft to posterior descending, saphenous vein     graft to diagonal). 2. Open saphenous vein harvest from the right leg.  SURGEON:  Ivin Poot, M.D.  ASSISTANT:  John Giovanni, P.A.-C.  ANESTHESIA:  General.  PREOPERATIVE DIAGNOSIS:  Severe three-vessel coronary artery disease with class III angina and preserved LV function.  CLINICAL NOTE:  The patient is a 60 year old Caucasian female, reformed smoker, with previously diagnosed severe multivessel coronary artery disease and class III progressive angina.  I had seen the patient in consultation in the office and reviewed her preoperative catheterization and echocardiogram.  Her cardiologist recommended coronary artery bypass grafting - Dr. Daneen Schick.  I agreed with the recommendation and discussed the procedure of CABG in detail with the patient including the indications, benefits, alternatives, and risks.  We discussed the location of the surgical incisions, the use of general anesthesia, and the expected postoperative hospital recovery.  I discussed with her the risks of the surgery including risks of MI, stroke, bleeding requiring transfusion, postoperative infection, postoperative pulmonary problems including pleural effusion, and death.  After reviewing these issues, she demonstrated her understanding and agreed to proceed with surgery under what I felt was an informed consent.  OPERATIVE FINDINGS: 1. Adequate target in the right coronary and LAD distribution, distal     circumflex too small to graft.Diagonal  small but graftable 2. Small saphenous vein.  The vein was exposed in the left leg, but     not harvested.  The vein was exposed and harvested from the right     leg, but was small and required an open technique for successful     harvest.  OPERATIVE PROCEDURE:  The patient was brought to the operating room and placed supine on the operating table.  General anesthesia was induced under invasive hemodynamic monitoring.  The chest, abdomen, and legs were prepped with Betadine and draped as a sterile field.  A sternal incision was made after a proper time-out.  A leg incision was made and then extended to an open approach to harvest the vein from the right leg.  The left internal mammary artery was harvested as a pedicle graft from its origin at the subclavian vessels.  It was a good vessel with excellent flow.  The sternal retractor was placed and the pericardium was opened and suspended.  Pursestrings were placed in the ascending aorta and right atrium.  When the vein was harvested, heparin was administered and the ACT was documented as being therapeutic.  The patient was cannulated and placed on cardiopulmonary bypass.  The coronaries were identified for grafting.  The LAD, diagonal, and posterior descending were adequate targets.  The distal circumflex was  too small to graft.  The proximal circumflex marginal was without disease.  The mammary artery and vein grafts were prepared for the anastomoses and cardioplegia cannulas were placed both antegrade and retrograde cold blood cardioplegia.  The patient was cooled to 32 degrees and aortic crossclamp was applied.  One liter of cold blood cardioplegia was delivered in split doses between the antegrade aortic and retrograde coronary sinus catheters with good cardioplegic arrest.  Cardioplegia was delivered every 20 minutes.  The distal coronaries were performed.  The first distal anastomosis was the posterior descending.  This had a  proximal 99% stenosis.  A reverse saphenous vein was sewn end-to-side with running 7-0 Prolene with good flow through the graft.  Cardioplegia was redosed.  The second distal anastomosis was to the diagonal branch to LAD.  This had a proximal 90% stenosis.  A reverse saphenous vein was sewn end-to- side with running 7-0 Prolene with good flow through the graft. Cardioplegia was redosed.  The third distal anastomosis was to the distal LAD.  It was a 1.5-mm vessel.  The left IMA pedicle was brought through an opening in the left lateral pericardium and was brought down onto the LAD and sewn end-to- side with running 8-0 Prolene.  There was good flow through the anastomosis after briefly releasing the pedicle bulldog on the mammary artery.  The bulldog was reapplied and the pedicle was secured with epicardium with 6-0 Prolene.  Cardioplegia was redosed.  While the crossclamp was in place, 2 proximal vein anastomoses were performed on the ascending aorta with a 4.5-mm punch and 6-0 Prolene. Prior to tying down the final proximal anastomosis, air was vented from the coronaries with a dose of retrograde warm blood cardioplegia.  The crossclamp was removed.  The heart resumed a spontaneous rhythm.  The vein grafts were de-aired and opened and each had good flow.  Hemostasis was documented at the proximal distal anastomoses.  Temporary pacing wires were applied.  The patient was rewarmed and reperfused.  The lungs were expanded.  The ventilator was resumed.  The patient was then weaned from cardiopulmonary bypass without difficulty.  She did not require inotropes.  Cardiac output was normal.  Echo showed normal LV function. Protamine was administered without adverse reaction.  The cannulas were removed.  The mediastinum was irrigated.  The superior pericardial fat was closed over the aorta.  Anterior mediastinal and left pleural chest tube were placed and brought out through separate  incisions.  The sternum was closed with wire.  The pectoralis fascia was closed with a running #1 Vicryl.  The subcutaneous and skin layers were closed in running Vicryl.  Sterile dressings were applied.  Total cardiopulmonary bypass time was 100 minutes.     Ivin Poot, M.D.     PV/MEDQ  D:  06/17/2016  T:  06/18/2016  Job:  [id number]  cc:   Belva Crome, M.D. Lauree Chandler, MD

## 2016-06-18 NOTE — Progress Notes (Signed)
CT surgery p.m. Rounds Patient examined and record reviewed.Hemodynamics stable,labs satisfactory.Patient had stable day.Continue current care. Tharon Aquas Trigt III 06/18/2016

## 2016-06-19 ENCOUNTER — Inpatient Hospital Stay (HOSPITAL_COMMUNITY): Payer: Medicare Other

## 2016-06-19 LAB — BASIC METABOLIC PANEL
Anion gap: 5 (ref 5–15)
BUN: 14 mg/dL (ref 6–20)
CO2: 28 mmol/L (ref 22–32)
Calcium: 8 mg/dL — ABNORMAL LOW (ref 8.9–10.3)
Chloride: 105 mmol/L (ref 101–111)
Creatinine, Ser: 0.58 mg/dL (ref 0.44–1.00)
GFR calc Af Amer: >60 mL/min (ref >60)
GFR calc non Af Amer: >60 mL/min (ref >60)
Glucose, Bld: 122 mg/dL — ABNORMAL HIGH (ref 65–99)
Potassium: 4 mmol/L (ref 3.5–5.1)
Sodium: 138 mmol/L (ref 135–145)

## 2016-06-19 LAB — CBC
HCT: 30.2 % — ABNORMAL LOW (ref 36.0–46.0)
Hemoglobin: 9.8 g/dL — ABNORMAL LOW (ref 12.0–15.0)
MCH: 28.5 pg (ref 26.0–34.0)
MCHC: 32.5 g/dL (ref 30.0–36.0)
MCV: 87.8 fL (ref 78.0–100.0)
Platelets: 150 10*3/uL (ref 150–400)
RBC: 3.44 MIL/uL — ABNORMAL LOW (ref 3.87–5.11)
RDW: 15.6 % — ABNORMAL HIGH (ref 11.5–15.5)
WBC: 17.3 10*3/uL — ABNORMAL HIGH (ref 4.0–10.5)

## 2016-06-19 LAB — GLUCOSE, CAPILLARY
Glucose-Capillary: 136 mg/dL — ABNORMAL HIGH (ref 65–99)
Glucose-Capillary: 130 mg/dL — ABNORMAL HIGH (ref 65–99)
Glucose-Capillary: 161 mg/dL — ABNORMAL HIGH (ref 65–99)
Glucose-Capillary: 151 mg/dL — ABNORMAL HIGH (ref 65–99)
Glucose-Capillary: 124 mg/dL — ABNORMAL HIGH (ref 65–99)

## 2016-06-19 MED ORDER — METOCLOPRAMIDE HCL 5 MG/ML IJ SOLN
10.0000 mg | Freq: Four times a day (QID) | INTRAMUSCULAR | Status: DC
  Administered 2016-06-19 – 2016-06-23 (×17): 10 mg via INTRAVENOUS
  Filled 2016-06-19 (×18): qty 2

## 2016-06-19 MED ORDER — MOVING RIGHT ALONG BOOK
Freq: Once | Status: AC
  Administered 2016-06-19: 1
  Filled 2016-06-19: qty 1

## 2016-06-19 MED ORDER — SODIUM CHLORIDE 0.9% FLUSH: 3.0000 mL | INTRAVENOUS | Status: DC | PRN

## 2016-06-19 MED ORDER — POTASSIUM CHLORIDE CRYS ER 20 MEQ PO TBCR
20.0000 meq | EXTENDED_RELEASE_TABLET | Freq: Every day | ORAL | Status: DC
  Administered 2016-06-19: 20 meq via ORAL
  Filled 2016-06-19: qty 1

## 2016-06-19 MED ORDER — MAGNESIUM HYDROXIDE 400 MG/5ML PO SUSP
30.0000 mL | Freq: Every day | ORAL | Status: DC | PRN
  Administered 2016-06-19: 30 mL via ORAL
  Filled 2016-06-19: qty 30

## 2016-06-19 MED ORDER — SODIUM CHLORIDE 0.9 % IV SOLN: 250.0000 mL | INTRAVENOUS | Status: DC | PRN

## 2016-06-19 MED ORDER — GUAIFENESIN ER 600 MG PO TB12
600.0000 mg | ORAL_TABLET | Freq: Two times a day (BID) | ORAL | Status: DC
  Administered 2016-06-19 – 2016-06-24 (×11): 600 mg via ORAL
  Filled 2016-06-19 (×11): qty 1

## 2016-06-19 MED ORDER — SODIUM CHLORIDE 0.9% FLUSH
3.0000 mL | Freq: Two times a day (BID) | INTRAVENOUS | Status: DC
  Administered 2016-06-19 – 2016-06-22 (×6): 3 mL via INTRAVENOUS

## 2016-06-19 MED ORDER — INSULIN ASPART 100 UNIT/ML ~~LOC~~ SOLN
0.0000 [IU] | Freq: Three times a day (TID) | SUBCUTANEOUS | Status: DC
  Administered 2016-06-19: 4 [IU] via SUBCUTANEOUS
  Administered 2016-06-19: 2 [IU] via SUBCUTANEOUS

## 2016-06-19 MED FILL — Electrolyte-R (PH 7.4) Solution: INTRAVENOUS | Qty: 4000 | Status: AC

## 2016-06-19 MED FILL — Sodium Chloride IV Soln 0.9%: INTRAVENOUS | Qty: 2000 | Status: AC

## 2016-06-19 MED FILL — Sodium Bicarbonate IV Soln 8.4%: INTRAVENOUS | Qty: 50 | Status: AC

## 2016-06-19 MED FILL — Heparin Sodium (Porcine) Inj 1000 Unit/ML: INTRAMUSCULAR | Qty: 10 | Status: AC

## 2016-06-19 MED FILL — Mannitol IV Soln 20%: INTRAVENOUS | Qty: 500 | Status: AC

## 2016-06-19 MED FILL — Lidocaine HCl IV Inj 20 MG/ML: INTRAVENOUS | Qty: 5 | Status: AC

## 2016-06-19 NOTE — Progress Notes (Signed)
2 Days Post-Op Procedure(s) (LRB): CORONARY ARTERY BYPASS GRAFTING (CABG), ON PUMP, TIMES THREE, USING LEFT INTERNAL MAMMARY ARTERY, RIGHT GREATER SAPHENOUS VEIN HARVESTED ENDOSCOPICALLY (N/A) TRANSESOPHAGEAL ECHOCARDIOGRAM (TEE) (N/A) Subjective: Progressing needs SW help for SNF  Objective: Vital signs in last 24 hours: Temp:  [97.6 F (36.4 C)-99.2 F (37.3 C)] 98.3 F (36.8 C) (12/21 1747) Pulse Rate:  [67-96] 96 (12/21 1747) Cardiac Rhythm: Normal sinus rhythm (12/21 1600) Resp:  [16-32] 21 (12/21 1747) BP: (107-152)/(59-82) 144/74 (12/21 1747) SpO2:  [92 %-98 %] 98 % (12/21 1747) Weight:  [190 lb 14.7 oz (86.6 kg)] 190 lb 14.7 oz (86.6 kg) (12/21 0500)  Hemodynamic parameters for last 24 hours:  stable  Intake/Output from previous day: 12/20 0701 - 12/21 0700 In: 205.7 [I.V.:105.7; IV Piggyback:100] Out: 1360 [Urine:1270; Drains:20; Chest Tube:70] Intake/Output this shift: Total I/O In: -  Out: 815 [Urine:800; Drains:15]       Exam    General- alert and comfortable   Lungs- clear without rales, wheezes   Cor- regular rate and rhythm, no murmur , gallop   Abdomen- soft, non-tender   Extremities - warm, non-tender, minimal edema   Neuro- oriented, appropriate, no focal weakness   Lab Results:  Recent Labs  06/18/16 1721 06/18/16 1726 06/19/16 0400  WBC 17.0*  --  17.3*  HGB 10.2* 9.2* 9.8*  HCT 31.6* 27.0* 30.2*  PLT 139*  --  150   BMET:  Recent Labs  06/18/16 0352  06/18/16 1726 06/19/16 0400  NA 140  --  140 138  K 3.4*  --  3.5 4.0  CL 110  --  99* 105  CO2 24  --   --  28  GLUCOSE 109*  --  128* 122*  BUN 7  --  12 14  CREATININE 0.59  < > 0.70 0.58  CALCIUM 7.7*  --   --  8.0*  < > = values in this interval not displayed.  PT/INR:  Recent Labs  06/17/16 1325  LABPROT 15.6*  INR 1.23   ABG    Component Value Date/Time   PHART 7.372 06/17/2016 1716   HCO3 25.0 06/17/2016 1716   TCO2 28 06/18/2016 1726   O2SAT 97.0 06/17/2016  1716   CBG (last 3)   Recent Labs  06/19/16 0727 06/19/16 1204 06/19/16 1522  GLUCAP 130* 161* 151*    Assessment/Plan: S/P Procedure(s) (LRB): CORONARY ARTERY BYPASS GRAFTING (CABG), ON PUMP, TIMES THREE, USING LEFT INTERNAL MAMMARY ARTERY, RIGHT GREATER SAPHENOUS VEIN HARVESTED ENDOSCOPICALLY (N/A) TRANSESOPHAGEAL ECHOCARDIOGRAM (TEE) (N/A) Mobilize Diuresis Diabetes control d/c tubes/lines Plan for transfer to step-down: see transfer orders   LOS: 2 days    Tharon Aquas Trigt III 06/19/2016

## 2016-06-20 ENCOUNTER — Inpatient Hospital Stay (HOSPITAL_COMMUNITY): Payer: Medicare Other

## 2016-06-20 LAB — CBC
HCT: 29.7 % — ABNORMAL LOW (ref 36.0–46.0)
Hemoglobin: 9.8 g/dL — ABNORMAL LOW (ref 12.0–15.0)
MCH: 28.5 pg (ref 26.0–34.0)
MCHC: 33 g/dL (ref 30.0–36.0)
MCV: 86.3 fL (ref 78.0–100.0)
Platelets: 152 10*3/uL (ref 150–400)
RBC: 3.44 MIL/uL — ABNORMAL LOW (ref 3.87–5.11)
RDW: 15.3 % (ref 11.5–15.5)
WBC: 16 10*3/uL — ABNORMAL HIGH (ref 4.0–10.5)

## 2016-06-20 LAB — BASIC METABOLIC PANEL
Anion gap: 7 (ref 5–15)
BUN: 11 mg/dL (ref 6–20)
CO2: 28 mmol/L (ref 22–32)
Calcium: 8.3 mg/dL — ABNORMAL LOW (ref 8.9–10.3)
Chloride: 104 mmol/L (ref 101–111)
Creatinine, Ser: 0.53 mg/dL (ref 0.44–1.00)
GFR calc Af Amer: >60 mL/min (ref >60)
GFR calc non Af Amer: >60 mL/min (ref >60)
Glucose, Bld: 123 mg/dL — ABNORMAL HIGH (ref 65–99)
Potassium: 3.2 mmol/L — ABNORMAL LOW (ref 3.5–5.1)
Sodium: 139 mmol/L (ref 135–145)

## 2016-06-20 LAB — GLUCOSE, CAPILLARY
Glucose-Capillary: 124 mg/dL — ABNORMAL HIGH (ref 65–99)
Glucose-Capillary: 145 mg/dL — ABNORMAL HIGH (ref 65–99)
Glucose-Capillary: 114 mg/dL — ABNORMAL HIGH (ref 65–99)

## 2016-06-20 MED ORDER — LISINOPRIL 5 MG PO TABS
5.0000 mg | ORAL_TABLET | Freq: Every day | ORAL | Status: DC
  Administered 2016-06-20 – 2016-06-22 (×3): 5 mg via ORAL
  Filled 2016-06-20 (×3): qty 1

## 2016-06-20 MED ORDER — POTASSIUM CHLORIDE CRYS ER 20 MEQ PO TBCR
40.0000 meq | EXTENDED_RELEASE_TABLET | Freq: Two times a day (BID) | ORAL | Status: AC
  Administered 2016-06-20 – 2016-06-23 (×8): 40 meq via ORAL
  Filled 2016-06-20 (×8): qty 2

## 2016-06-20 MED ORDER — FUROSEMIDE 40 MG PO TABS
40.0000 mg | ORAL_TABLET | Freq: Every day | ORAL | Status: DC
  Administered 2016-06-20 – 2016-06-21 (×2): 40 mg via ORAL
  Filled 2016-06-20 (×2): qty 1

## 2016-06-20 MED ORDER — AMIODARONE HCL 200 MG PO TABS
400.0000 mg | ORAL_TABLET | Freq: Two times a day (BID) | ORAL | Status: DC
  Administered 2016-06-20 – 2016-06-22 (×6): 400 mg via ORAL
  Filled 2016-06-20 (×6): qty 2
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
## 2016-06-20 NOTE — Discharge Instructions (Signed)
We ask the SNF to please do the following: °1. Please obtain vital signs at least one time daily °2.Please weigh the patient daily. If he or she continues to gain weight or develops lower extremity edema, contact the office at [phone]. °3. Ambulate patient at least three times daily and please use sternal precautions. ° °Coronary Artery Bypass Grafting, Care After °These instructions give you information on caring for yourself after your procedure. Your doctor may also give you more specific instructions. Call your doctor if you have any problems or questions after your procedure. °Follow these instructions at home: °· Only take medicine as told by your doctor. Take medicines exactly as told. Do not stop taking medicines or start any new medicines without talking to your doctor first. °· Take your pulse as told by your doctor. °· Do deep breathing as told by your doctor. Use your breathing device (incentive spirometer), if given, to practice deep breathing several times a day. Support your chest with a pillow or your arms when you take deep breaths or cough. °· Keep the area clean, dry, and protected where the surgery cuts (incisions) were made. Remove bandages (dressings) only as told by your doctor. If strips were applied to surgical area, do not take them off. They fall off on their own. °· Check the surgery area daily for puffiness (swelling), redness, or leaking fluid. °· If surgery cuts were made in your legs: °? Avoid crossing your legs. °? Avoid sitting for long periods of time. Change positions every 30 minutes. °? Raise your legs when you are sitting. Place them on pillows. °· Wear stockings that help keep blood clots from forming in your legs (compression stockings). °· Only take sponge baths until your doctor says it is okay to take showers. Pat the surgery area dry. Do not rub the surgery area with a washcloth or towel. Do not bathe, swim, or use a hot tub until your doctor says it is  okay. °· Eat foods that are high in fiber. These include raw fruits and vegetables, whole grains, beans, and nuts. Choose lean meats. Avoid canned, processed, and fried foods. °· Drink enough fluids to keep your pee (urine) clear or pale yellow. °· Weigh yourself every day. °· Rest and limit activity as told by your doctor. You may be told to: °? Stop any activity if you have chest pain, shortness of breath, changes in heartbeat, or dizziness. Get help right away if this happens. °? Move around often for short amounts of time or take short walks as told by your doctor. Gradually become more active. You may need help to strengthen your muscles and build endurance. °? Avoid lifting, pushing, or pulling anything heavier than 10 pounds (4.5 kg) for at least 6 weeks after surgery. °· Do not drive until your doctor says it is okay. °· Ask your doctor when you can go back to work. °· Ask your doctor when you can begin sexual activity again. °· Follow up with your doctor as told. °Contact a doctor if: °· You have puffiness, redness, more pain, or fluid draining from the incision site. °· You have a fever. °· You have puffiness in your ankles or legs. °· You have pain in your legs. °· You gain 2 or more pounds (0.9 kg) a day. °· You feel sick to your stomach (nauseous) or throw up (vomit). °· You have watery poop (diarrhea). °Get help right away if: °· You have chest pain that goes to your   jaw or arms. °· You have shortness of breath. °· You have a fast or irregular heartbeat. °· You notice a "clicking" in your breastbone when you move. °· You have numbness or weakness in your arms or legs. °· You feel dizzy or light-headed. °This information is not intended to replace advice given to you by your health care provider. Make sure you discuss any questions you have with your health care provider. °Document Released: 06/21/2013 Document Revised: 11/22/2015 Document Reviewed: 11/23/2012 °Elsevier Interactive Patient Education ©  2017 [employer] ° °

## 2016-06-20 NOTE — Progress Notes (Signed)
CARDIAC REHAB PHASE I   PRE:  Rate/Rhythm: 93 SR with many PACs (several in row at times)    BP: sitting 136/80    SaO2: 94 2L, 93 RA  MODE:  Ambulation: 150 ft   POST:  Rate/Rhythm: 136 ? afib at times, reverts back to SR    BP: sitting 145/59     SaO2: 92 RA  Pt in bed all morning. Moves fairly well. D/c'd O2 for walking. Used RW, min assist. Steady, slow pace. No major c/o. SaO2 90-92 RA. Pt HR fluctuating with many PACs, Afib bouts. Slower at rest. To recliner with feet elevated. Encouraged sitting up, using IS, walking x2 more today. [place], ACSM 06/20/2016 12:29 PM
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
## 2016-06-21 NOTE — Progress Notes (Addendum)
      [address]       [place] [address]             [phone]      4 Days Post-Op Procedure(s) (LRB): CORONARY ARTERY BYPASS GRAFTING (CABG), ON PUMP, TIMES THREE, USING LEFT INTERNAL MAMMARY ARTERY, RIGHT GREATER SAPHENOUS VEIN HARVESTED ENDOSCOPICALLY (N/A) TRANSESOPHAGEAL ECHOCARDIOGRAM (TEE) (N/A)   Subjective:  Ms. Walenta has no specific complaints.  She had some nausea overnight.  She is ambulating with assistance, but will need SNF at discharge  + BM  Objective: Vital signs in last 24 hours: Temp:  [98.2 F (36.8 C)-100 F (37.8 C)] 98.2 F (36.8 C) (12/23 0616) Pulse Rate:  [68-108] 108 (12/23 0814) Cardiac Rhythm: Other (Comment) (12/22 1900) Resp:  [20] 20 (12/23 0616) BP: (99-157)/(73-84) 156/73 (12/23 0650) SpO2:  [90 %-97 %] 97 % (12/23 0616) Weight:  [173 lb 3.2 oz (78.6 kg)] 173 lb 3.2 oz (78.6 kg) (12/23 0500)  Intake/Output from previous day: 12/22 0701 - 12/23 0700 In: 360 [P.O.:360] Out: 1100 [Urine:1100]  General appearance: alert, cooperative and no distress Heart: irregularly irregular rhythm Lungs: clear to auscultation bilaterally Abdomen: soft, non-tender; bowel sounds normal; no masses,  no organomegaly Extremities: edema none Wound: clean and dry  Lab Results:  Recent Labs  06/19/16 0400 06/20/16 0412  WBC 17.3* 16.0*  HGB 9.8* 9.8*  HCT 30.2* 29.7*  PLT 150 152   BMET:  Recent Labs  06/19/16 0400 06/20/16 0412  NA 138 139  K 4.0 3.2*  CL 105 104  CO2 28 28  GLUCOSE 122* 123*  BUN 14 11  CREATININE 0.58 0.53  CALCIUM 8.0* 8.3*    PT/INR: No results for input(s): LABPROT, INR in the last 72 hours. ABG    Component Value Date/Time   PHART 7.372 06/17/2016 1716   HCO3 25.0 06/17/2016 1716   TCO2 28 06/18/2016 1726   O2SAT 97.0 06/17/2016 1716   CBG (last 3)   Recent Labs  06/20/16 0602 06/20/16 1057 06/20/16 1601  GLUCAP 124* 145* 114*    Assessment/Plan: S/P Procedure(s)  (LRB): CORONARY ARTERY BYPASS GRAFTING (CABG), ON PUMP, TIMES THREE, USING LEFT INTERNAL MAMMARY ARTERY, RIGHT GREATER SAPHENOUS VEIN HARVESTED ENDOSCOPICALLY (N/A) TRANSESOPHAGEAL ECHOCARDIOGRAM (TEE) (N/A)  1. CV- A. Fib/Flutter?, multiples PVCs, trigmeminy at times- on Lopressor 12.5 mg BID, Amiodarone started per PVT for PVCs 2. Pulm- no acute issues, continue IS 3. Renal- creatinine WNL, hypokalemia- supplementing K, stop Lasix weight is below baseline, no LE edema... Repeat BMET in AM 4. CBGS- controlled, not a diabetic will d/c SSIP, Levemir 5. Deconditioning- SNF at discharge 6. Dispo- its difficult to make out her rhythm... Will review with Dr. Roxy Manns and adjust medications if necessary, supplement K, repeat BMET in AM.... Leave wires in place today   LOS: 4 days    BARRETT, ERIN 06/21/2016   I have seen and examined the patient and agree with the assessment and plan as outlined.  Patient is currently in NSR w/ frequent PAC's, PVC's  Rexene Alberts, MD 06/21/2016 10:30 AM

## 2016-06-21 NOTE — Progress Notes (Signed)
CARDIAC REHAB PHASE I   PRE:  Rate/Rhythm: 84 irregular freq PAC/PVCs  BP:  Supine: 143/79 Sitting:   Standing:    SaO2: 92% RA  MODE:  Ambulation: 350 ft   POST:  Rate/Rhythm: 107 freq PAC/PVCs  BP:  Supine: 132/71 Sitting:   Standing:    SaO2: 88% RA inc to 94% RA with PLB  [id number] Assisted patient to bathroom prior to walk. Patient tolerated ambulation fair with assist x1 and pushing a rolling walker. Gait slow, steady, 3 standing rest breaks taken: 2 for incisional pain, 1 for SOB, encouraged pursed-lip breathing. To bed after walk, SaO2 88% after walk increased to 94% with pursed-lip breathing.  Sol Passer, MS, ACSM CEP

## 2016-06-22 LAB — BASIC METABOLIC PANEL
Anion gap: 8 (ref 5–15)
BUN: 15 mg/dL (ref 6–20)
CALCIUM: 8.4 mg/dL — AB (ref 8.9–10.3)
CO2: 25 mmol/L (ref 22–32)
Chloride: 105 mmol/L (ref 101–111)
Creatinine, Ser: 0.56 mg/dL (ref 0.44–1.00)
GFR calc Af Amer: >60 mL/min (ref >60)
GLUCOSE: 126 mg/dL — AB (ref 65–99)
POTASSIUM: 3.7 mmol/L (ref 3.5–5.1)
SODIUM: 138 mmol/L (ref 135–145)

## 2016-06-22 MED ORDER — METOPROLOL TARTRATE 25 MG PO TABS
25.0000 mg | ORAL_TABLET | Freq: Two times a day (BID) | ORAL | Status: DC
  Administered 2016-06-22 (×2): 25 mg via ORAL
  Filled 2016-06-22 (×2): qty 1

## 2016-06-22 NOTE — Progress Notes (Addendum)
      [address]       [place] [address]             [phone]      5 Days Post-Op Procedure(s) (LRB): CORONARY ARTERY BYPASS GRAFTING (CABG), ON PUMP, TIMES THREE, USING LEFT INTERNAL MAMMARY ARTERY, RIGHT GREATER SAPHENOUS VEIN HARVESTED ENDOSCOPICALLY (N/A) TRANSESOPHAGEAL ECHOCARDIOGRAM (TEE) (N/A)   Subjective:  Patient states she didn't sleep well last night.  She states she was up almost every hour.  She also has some pain this morning, but doesn't like to use narcotics for fear of addiction.  I explained the proper use of pain medication to the patient and she has a better understanding of adequate pain control.  + ambulation   + BM  Objective: Vital signs in last 24 hours: Temp:  [97.6 F (36.4 C)-98.7 F (37.1 C)] 97.8 F (36.6 C) (12/24 0634) Pulse Rate:  [83-93] 83 (12/24 0634) Cardiac Rhythm: Normal sinus rhythm (12/23 1950) Resp:  [18] 18 (12/24 0634) BP: (111-126)/(64-98) 123/74 (12/24 0634) SpO2:  [95 %-96 %] 95 % (12/24 0634) Weight:  [179 lb 7.3 oz (81.4 kg)] 179 lb 7.3 oz (81.4 kg) (12/24 [id number])  General appearance: alert, cooperative and no distress Heart: regular rate and rhythm Lungs: clear to auscultation bilaterally Abdomen: soft, non-tender; bowel sounds normal; no masses,  no organomegaly Extremities: edema trace Wound: clean and dry  Lab Results:  Recent Labs  06/20/16 0412  WBC 16.0*  HGB 9.8*  HCT 29.7*  PLT 152   BMET:  Recent Labs  06/20/16 0412 06/22/16 0316  NA 139 138  K 3.2* 3.7  CL 104 105  CO2 28 25  GLUCOSE 123* 126*  BUN 11 15  CREATININE 0.53 0.56  CALCIUM 8.3* 8.4*    PT/INR: No results for input(s): LABPROT, INR in the last 72 hours. ABG    Component Value Date/Time   PHART 7.372 06/17/2016 1716   HCO3 25.0 06/17/2016 1716   TCO2 28 06/18/2016 1726   O2SAT 97.0 06/17/2016 1716   CBG (last 3)   Recent Labs  06/20/16 0602 06/20/16 1057 06/20/16 1601  GLUCAP 124* 145* 114*     Assessment/Plan: S/P Procedure(s) (LRB): CORONARY ARTERY BYPASS GRAFTING (CABG), ON PUMP, TIMES THREE, USING LEFT INTERNAL MAMMARY ARTERY, RIGHT GREATER SAPHENOUS VEIN HARVESTED ENDOSCOPICALLY (N/A) TRANSESOPHAGEAL ECHOCARDIOGRAM (TEE) (N/A)  1. CV- NSR, with PVCs- will increase Lopressor to 25 mg BID, continue Amiodarone 2. Pulm- no acute issues, continue IS 3. Renal- creatinine WNL, K up to 3.7, will continue supplementation, weight is trending down off Lasix 4. Deconditioning- patient needs SNF at discharge as lives alone, awaiting PT consult 5. Dispo- patient stable, will d/c EPW, continue to supplement K, monitor HR   LOS: 5 days    BARRETT, ERIN 06/22/2016  I have seen and examined the patient and agree with the assessment and plan as outlined.  Slow but steady progress.  Rexene Alberts, MD 06/22/2016 10:32 AM

## 2016-06-22 NOTE — Evaluation (Signed)
Physical Therapy Evaluation Patient Details Name: Donna Woodward MRN: [id number] DOB: 1956/04/19 Today's Date: 06/22/2016   History of Present Illness  Pt is a 60 y.o. female admitted for CORONARY ARTERY BYPASS GRAFTING (CABG), ON PUMP, TIMES THREE, USING LEFT INTERNAL MAMMARY ARTERY, RIGHT GREATER SAPHENOUS VEIN. PMH: HTN, depression, DJD lumbar spine, CAD, anxiety  Clinical Impression  At this time the pt does require assistance with mobility and demonstrates decreased activity tolerance. Pt able to ambulate 200 ft with rw but required multiple standing breaks due to fatigue. Additionally the pt does live alone. Based upon the patient's current mobility level, recommending SNF for further rehabilitation following acute stay. PT to continue to follow.     Follow Up Recommendations SNF    Equipment Recommendations  Rolling walker with 5" wheels    Recommendations for Other Services       Precautions / Restrictions Precautions Precautions: Fall;Sternal Precaution Comments: pt consistent with sternal precautions Restrictions Weight Bearing Restrictions: No      Mobility  Bed Mobility Overal bed mobility: Needs Assistance Bed Mobility: Supine to Sit;Sit to Supine     Supine to sit: Min assist Sit to supine: Min assist   General bed mobility comments: assist with trunk to come to sitting and LEs to return to bed.   Transfers Overall transfer level: Needs assistance Equipment used: Rolling walker (2 wheeled) Transfers: Sit to/from Stand Sit to Stand: Min guard         General transfer comment: reminder cues for powering up to standing with LEs  Ambulation/Gait Ambulation/Gait assistance: Min guard Ambulation Distance (Feet): 200 Feet Assistive device: Rolling walker (2 wheeled) Gait Pattern/deviations: Step-through pattern;Decreased step length - left;Decreased step length - right Gait velocity: decreased   General Gait Details: stopping X4 due to SOB, recovering  quickly  Stairs            Wheelchair Mobility    Modified Rankin (Stroke Patients Only)       Balance Overall balance assessment: Needs assistance Sitting-balance support: No upper extremity supported Sitting balance-Leahy Scale: Fair     Standing balance support: Bilateral upper extremity supported Standing balance-Leahy Scale: Fair Standing balance comment: using rw                             Pertinent Vitals/Pain Pain Assessment: 0-10 Pain Score: 3  Pain Location: chest Pain Descriptors / Indicators: Sharp Pain Intervention(s): Limited activity within patient's tolerance;Monitored during session    Home Living Family/patient expects to be discharged to:: Skilled nursing facility                      Prior Function Level of Independence: Independent with assistive device(s)         Comments: using SPC for ambulation     Hand Dominance        Extremity/Trunk Assessment   Upper Extremity Assessment Upper Extremity Assessment: Overall WFL for tasks assessed    Lower Extremity Assessment Lower Extremity Assessment: Generalized weakness       Communication   Communication: No difficulties  Cognition Arousal/Alertness: Awake/alert Behavior During Therapy: WFL for tasks assessed/performed Overall Cognitive Status: Within Functional Limits for tasks assessed                      General Comments General comments (skin integrity, edema, etc.): Pt refused sitting up in chair following session.     Exercises  Assessment/Plan    PT Assessment Patient needs continued PT services  PT Problem List Decreased strength;Decreased range of motion;Decreased activity tolerance;Decreased balance;Decreased mobility          PT Treatment Interventions DME instruction;Gait training;Stair training;Functional mobility training;Therapeutic activities;Therapeutic exercise;Patient/family education    PT Goals (Current goals can  be found in the Care Plan section)  Acute Rehab PT Goals Patient Stated Goal: Go somewhere to get more rehab before going home PT Goal Formulation: With patient Time For Goal Achievement: 07/06/16 Potential to Achieve Goals: Good    Frequency Min 2X/week   Barriers to discharge        Co-evaluation               End of Session Equipment Utilized During Treatment: Gait belt Activity Tolerance: Patient tolerated treatment well Patient left: in bed;with call bell/phone within reach Nurse Communication: Mobility status         Time: [id number] PT Time Calculation (min) (ACUTE ONLY): 29 min   Charges:   PT Evaluation $PT Eval Moderate Complexity: 1 Procedure PT Treatments $Gait Training: 8-22 mins   PT G Codes:        Cassell Clement, PT, CSCS Pager [phone] Office [phone]  06/22/2016, 9:41 AM

## 2016-06-22 NOTE — Progress Notes (Signed)
EPW wires removed per protocol. Pt assisted to bathroom prior to removal. VSS. Pt verbalized understanding of 1 hr bedrest ending at 12:15pm. Call light within reach, will continue to monitor.   Fritz Pickerel, RN

## 2016-06-23 MED ORDER — POTASSIUM CHLORIDE CRYS ER 20 MEQ PO TBCR: 20.0000 meq | EXTENDED_RELEASE_TABLET | Freq: Every day | ORAL | Status: DC

## 2016-06-23 MED ORDER — LISINOPRIL-HYDROCHLOROTHIAZIDE 20-25 MG PO TABS: 1.0000 | ORAL_TABLET | Freq: Every day | ORAL | Status: DC

## 2016-06-23 MED ORDER — LISINOPRIL 10 MG PO TABS
20.0000 mg | ORAL_TABLET | Freq: Every day | ORAL | Status: DC
  Administered 2016-06-23 – 2016-06-24 (×2): 20 mg via ORAL
  Filled 2016-06-23 (×2): qty 2

## 2016-06-23 MED ORDER — HYDROCHLOROTHIAZIDE 25 MG PO TABS
25.0000 mg | ORAL_TABLET | Freq: Every day | ORAL | Status: DC
  Administered 2016-06-23 – 2016-06-24 (×2): 25 mg via ORAL
  Filled 2016-06-23 (×2): qty 1

## 2016-06-23 MED ORDER — FUROSEMIDE 40 MG PO TABS: 40.0000 mg | ORAL_TABLET | Freq: Every day | ORAL | Status: DC

## 2016-06-23 MED ORDER — AMIODARONE HCL 200 MG PO TABS
200.0000 mg | ORAL_TABLET | Freq: Two times a day (BID) | ORAL | Status: DC
  Administered 2016-06-23 – 2016-06-24 (×3): 200 mg via ORAL
  Filled 2016-06-23 (×3): qty 1

## 2016-06-23 MED ORDER — POTASSIUM CHLORIDE CRYS ER 20 MEQ PO TBCR
20.0000 meq | EXTENDED_RELEASE_TABLET | Freq: Every day | ORAL | Status: DC
Start: 2016-06-24 — End: 2016-06-24
  Administered 2016-06-24: 20 meq via ORAL
  Filled 2016-06-23: qty 1

## 2016-06-23 MED ORDER — METOPROLOL SUCCINATE ER 50 MG PO TB24
50.0000 mg | ORAL_TABLET | Freq: Every day | ORAL | Status: DC
  Administered 2016-06-23 – 2016-06-24 (×2): 50 mg via ORAL
  Filled 2016-06-23 (×2): qty 1

## 2016-06-23 NOTE — Progress Notes (Addendum)
      [address]       [place] [address]             [phone]     CARDIOTHORACIC SURGERY PROGRESS NOTE  6 Days Post-Op  S/P Procedure(s) (LRB): CORONARY ARTERY BYPASS GRAFTING (CABG), ON PUMP, TIMES THREE, USING LEFT INTERNAL MAMMARY ARTERY, RIGHT GREATER SAPHENOUS VEIN HARVESTED ENDOSCOPICALLY (N/A) TRANSESOPHAGEAL ECHOCARDIOGRAM (TEE) (N/A)  Subjective: Feels better.  Appetite improving.  Mild soreness.  Ambulating better  Objective: Vital signs in last 24 hours: Temp:  [98.1 F (36.7 C)-99 F (37.2 C)] 98.1 F (36.7 C) (12/25 0532) Pulse Rate:  [79-88] 88 (12/25 0532) Cardiac Rhythm: Junctional rhythm (12/24 1900) Resp:  [18-19] 18 (12/25 0532) BP: (111-142)/(59-76) 138/68 (12/25 0532) SpO2:  [94 %-96 %] 94 % (12/25 0532) Weight:  [177 lb 6.4 oz (80.5 kg)] 177 lb 6.4 oz (80.5 kg) (12/25 0528)  Physical Exam:  Rhythm:   sinus  Breath sounds: clear  Heart sounds:  RRR  Incisions:  Clean and dry  Abdomen:  Soft, non-distended, non-tender  Extremities:  Warm, well-perfused    Intake/Output from previous day: 12/24 0701 - 12/25 0700 In: 360 [P.O.:360] Out: -  Intake/Output this shift: No intake/output data recorded.  Lab Results: No results for input(s): WBC, HGB, HCT, PLT in the last 72 hours. BMET:  Recent Labs  06/22/16 0316  NA 138  K 3.7  CL 105  CO2 25  GLUCOSE 126*  BUN 15  CREATININE 0.56  CALCIUM 8.4*    CBG (last 3)   Recent Labs  06/20/16 1057 06/20/16 1601  GLUCAP 145* 114*   PT/INR:  No results for input(s): LABPROT, INR in the last 72 hours.  CXR:  N/A  Assessment/Plan:  Making good progress Mobilize Gentle diuresis - restart home dose Toprol XL and lisinopril-HCTZ Decrease amiodarone 200 bid Anticipate d/c to SNF 1-2 days once arrangements in place  Rexene Alberts, MD 06/23/2016 9:15 AM

## 2016-06-24 DIAGNOSIS — I25810 Atherosclerosis of coronary artery bypass graft(s) without angina pectoris: Secondary | ICD-10-CM | POA: Diagnosis not present

## 2016-06-24 DIAGNOSIS — M6281 Muscle weakness (generalized): Secondary | ICD-10-CM | POA: Diagnosis not present

## 2016-06-24 DIAGNOSIS — E785 Hyperlipidemia, unspecified: Secondary | ICD-10-CM | POA: Diagnosis not present

## 2016-06-24 DIAGNOSIS — R031 Nonspecific low blood-pressure reading: Secondary | ICD-10-CM | POA: Diagnosis not present

## 2016-06-24 DIAGNOSIS — I2510 Atherosclerotic heart disease of native coronary artery without angina pectoris: Secondary | ICD-10-CM | POA: Diagnosis not present

## 2016-06-24 DIAGNOSIS — F419 Anxiety disorder, unspecified: Secondary | ICD-10-CM | POA: Diagnosis not present

## 2016-06-24 DIAGNOSIS — R202 Paresthesia of skin: Secondary | ICD-10-CM | POA: Diagnosis not present

## 2016-06-24 DIAGNOSIS — I10 Essential (primary) hypertension: Secondary | ICD-10-CM | POA: Diagnosis not present

## 2016-06-24 DIAGNOSIS — M5136 Other intervertebral disc degeneration, lumbar region: Secondary | ICD-10-CM | POA: Diagnosis not present

## 2016-06-24 DIAGNOSIS — Z951 Presence of aortocoronary bypass graft: Secondary | ICD-10-CM | POA: Diagnosis not present

## 2016-06-24 DIAGNOSIS — R0789 Other chest pain: Secondary | ICD-10-CM | POA: Diagnosis not present

## 2016-06-24 DIAGNOSIS — K219 Gastro-esophageal reflux disease without esophagitis: Secondary | ICD-10-CM | POA: Diagnosis not present

## 2016-06-24 DIAGNOSIS — R079 Chest pain, unspecified: Secondary | ICD-10-CM | POA: Diagnosis not present

## 2016-06-24 DIAGNOSIS — R531 Weakness: Secondary | ICD-10-CM | POA: Diagnosis not present

## 2016-06-24 MED ORDER — AMIODARONE HCL 200 MG PO TABS: 200.0000 mg | ORAL_TABLET | Freq: Two times a day (BID) | ORAL | Status: DC

## 2016-06-24 MED ORDER — GUAIFENESIN ER 600 MG PO TB12: 600.0000 mg | ORAL_TABLET | Freq: Two times a day (BID) | ORAL | Status: DC | PRN

## 2016-06-24 MED ORDER — TRAMADOL HCL 50 MG PO TABS: 50.0000 mg | ORAL_TABLET | ORAL | 0 refills | Status: DC | PRN

## 2016-06-24 MED ORDER — ASPIRIN 325 MG PO TBEC: 325.0000 mg | DELAYED_RELEASE_TABLET | Freq: Every day | ORAL | 0 refills | Status: DC

## 2016-06-24 NOTE — Clinical Social Work Placement (Signed)
   CLINICAL SOCIAL WORK PLACEMENT  NOTE  Date:  06/24/2016  Patient Details  Name: Santresa Bohlinger MRN: [id number] Date of Birth: Apr 18, 1956  Clinical Social Work is seeking post-discharge placement for this patient at the [place] level of care (*CSW will initial, date and re-position this form in  chart as items are completed):  Yes   Patient/family provided with [place] Work Department's list of facilities offering this level of care within the geographic area requested by the patient (or if unable, by the patient's family).  Yes   Patient/family informed of their freedom to choose among providers that offer the needed level of care, that participate in Medicare, Medicaid or managed care program needed by the patient, have an available bed and are willing to accept the patient.  Yes   Patient/family informed of [facility]'s ownership interest in [facility] and [facility], as well as of the fact that they are under no obligation to receive care at these facilities.  PASRR submitted to EDS on       PASRR number received on       Existing PASRR number confirmed on       FL2 transmitted to all facilities in geographic area requested by pt/family on       FL2 transmitted to all facilities within larger geographic area on       Patient informed that his/her managed care company has contracts with or will negotiate with certain facilities, including the following:            Patient/family informed of bed offers received.  Patient chooses bed at       Physician recommends and patient chooses bed at      Patient to be transferred to   on  .  Patient to be transferred to facility by       Patient family notified on   of transfer.  Name of family member notified:        PHYSICIAN Please sign FL2     Additional Comment:    _______________________________________________ Wende Neighbors, LCSW 06/24/2016, 11:08 AM

## 2016-06-24 NOTE — Care Management Note (Signed)
Case Management Note Marvetta Gibbons RN, BSN Unit 2W-Case Manager [phone]  Patient Details  Name: Chariya Deoliveira MRN: [id number] Date of Birth: 10-26-1955  Subjective/Objective:   Pt admitted s/p CABGx3                Action/Plan: PTA pt lived at home- per PT eval recommendation for SNF- CSW consulted for placement needs  Expected Discharge Date:     06/24/16             Expected Discharge Plan:  [place]  In-House Referral:  Clinical Social Work  Discharge planning Services  CM Consult  Post Acute Care Choice:    Choice offered to:     DME Arranged:    DME Agency:     HH Arranged:    [place] Agency:     Status of Service:  Completed, signed off  If discussed at [employer] of Stay Meetings, dates discussed:    Discharge Disposition: skilled facility  Additional Comments:  Dawayne Patricia, RN 06/24/2016, 11:58 AM

## 2016-06-24 NOTE — Progress Notes (Signed)
Physical Therapy Treatment Patient Details Name: Donna Woodward MRN: [id number] DOB: 02-24-1956 Today's Date: 06/24/2016    History of Present Illness Pt is a 60 y.o. female admitted for CORONARY ARTERY BYPASS GRAFTING (CABG), ON PUMP, TIMES THREE, USING LEFT INTERNAL MAMMARY ARTERY, RIGHT GREATER SAPHENOUS VEIN. PMH: HTN, depression, DJD lumbar spine, CAD, anxiety    PT Comments    Pt is up to walk with PT reviewing precautions well and needing very little prompting.  Her plan is to leave today per pt to go to SNF, and will follow acutely if needed after today to increase leg strength and gait distances with confines of her sternal precautions and with care for R leg graft site.  Follow Up Recommendations  SNF     Equipment Recommendations  Rolling walker with 5" wheels    Recommendations for Other Services       Precautions / Restrictions Precautions Precautions: Fall;Sternal Precaution Comments: pt consistent with sternal precautions Restrictions Weight Bearing Restrictions: Yes Other Position/Activity Restrictions: minimize wbing on UE's    Mobility  Bed Mobility Overal bed mobility: Needs Assistance Bed Mobility: Sit to Supine;Supine to Sit     Supine to sit: Min assist Sit to supine: Min assist   General bed mobility comments: assisted bed scooting with trend feature, pt assisted with her legs to push to [facility]  Transfers Overall transfer level: Needs assistance Equipment used: Rolling walker (2 wheeled) Transfers: Sit to/from [facility] Sit to Stand: Min guard Stand pivot transfers: Min guard       General transfer comment: prompting to lean trunk to forward wgt shift  Ambulation/Gait Ambulation/Gait assistance: Min guard Ambulation Distance (Feet): 250 Feet Assistive device: Rolling walker (2 wheeled) Gait Pattern/deviations: Step-through pattern;Decreased stride length;Wide base of support;Trunk flexed;Drifts right/left Gait velocity:  decreased Gait velocity interpretation: Below normal speed for age/gender General Gait Details: stopped x 3 to catch breath and O2 sats were over 90% each time   Stairs            Wheelchair Mobility    Modified Rankin (Stroke Patients Only)       Balance     Sitting balance-Leahy Scale: Good       Standing balance-Leahy Scale: Fair                      Cognition Arousal/Alertness: Awake/alert Behavior During Therapy: WFL for tasks assessed/performed Overall Cognitive Status: Within Functional Limits for tasks assessed                      Exercises      General Comments General comments (skin integrity, edema, etc.): Pt is preparing to leave today      Pertinent Vitals/Pain Pain Assessment: Faces Pain Score: 3  Faces Pain Scale: Hurts little more Pain Location: chest Pain Intervention(s): Limited activity within patient's tolerance;Monitored during session;Premedicated before session;Repositioned    Home Living                      Prior Function            PT Goals (current goals can now be found in the care plan section) Acute Rehab PT Goals Patient Stated Goal: rehab PT Goal Formulation: With patient Progress towards PT goals: Progressing toward goals    Frequency    Min 2X/week      PT Plan Current plan remains appropriate    Co-evaluation  End of Session Equipment Utilized During Treatment: Gait belt Activity Tolerance: Patient tolerated treatment well Patient left: in bed;with call bell/phone within reach;with bed alarm set     Time: [id number] PT Time Calculation (min) (ACUTE ONLY): 29 min  Charges:  $Gait Training: 8-22 mins $Therapeutic Activity: 8-22 mins                    G Codes:      Ramond Dial 07-19-16, 1:43 PM   Mee Hives, PT MS Acute Rehab Dept. Number: [place] and [place]

## 2016-06-24 NOTE — Progress Notes (Signed)
Clinical Social Worker facilitated patient discharge including contacting patient family and facility to confirm patient discharge plans.  Clinical information faxed to facility and family agreeable with plan.  CSW arranged ambulance transport via [facility] to [facility].  RN Katrina to call [phone] report prior to discharge.  Clinical Social Worker will sign off for now as social work intervention is no longer needed. Please consult [place] again if new need arises.  Rhea Pink, MSW, [place]

## 2016-06-24 NOTE — Care Management Important Message (Signed)
Important Message  Patient Details  Name: Donna Woodward MRN: [id number] Date of Birth: Apr 22, 1956   Medicare Important Message Given:  Yes    Donna Woodward Montine Circle 06/24/2016, 12:35 PM

## 2016-06-24 NOTE — Progress Notes (Signed)
Tacy Dura PA asked for removal of IV site dues to leaking, and does not need an new site.

## 2016-06-24 NOTE — Progress Notes (Signed)
CARDIAC REHAB PHASE I   PRE:  Rate/Rhythm: 88 SR c/ PVCs  BP:  Sitting: 130/64        SaO2: 94 RA  MODE:  Ambulation: 400 ft   POST:  Rate/Rhythm: 110 ST c/ PVCs  BP:  Sitting: 146/79        SaO2: 95 RA  Pt ambulated 400 ft on RA, rolling walker, assist x1, slow, steady gait, tolerated well. Pt c/o mild DOE, mild dizziness, standing rest x3. Cardiac surgery discharge education completed. Reviewed IS, sternal precautions, activity progression, exercise, heart healthy diet, and phase 2 cardiac rehab. Pt verbalized understanding. Pt agrees to phase 2 cardiac rehab referral, will send to [place] per pt request. Pt to bed per pt request after walk (states the recliner makes her back hurt), call bell within reach. Will follow if pt does not discharge today.  [id number] Lenna Sciara, RN, BSN 06/24/2016 9:09 AM

## 2016-06-24 NOTE — NC FL2 (Signed)
[place] LEVEL OF CARE SCREENING TOOL     IDENTIFICATION  Patient Name: Donna Woodward Birthdate: [birth year] Sex: female Admission Date (Current Location): 06/17/2016  [facility] and [place] Number:  Herbalist and Address:  [facility] [place]. [facility], [place] [address], [place], [place] [address]      Provider Number: [id number]  Attending Physician Name and Address:  Ivin Poot, MD  Relative Name and Phone Number:  Nira Conn [phone]    Current Level of Care: Hospital Recommended Level of Care: [place] Prior Approval Number:    Date Approved/Denied:   PASRR Number: [id number]  Discharge Plan: SNF    Current Diagnoses: Patient Active Problem List   Diagnosis Date Noted  . S/P CABG x 3 06/17/2016  . Family history of early CAD 06/04/2016  . Chest pain 06/04/2016  . Abnormal nuclear stress test 06/04/2016  . Degenerative joint disease (DJD) of lumbar spine 03/24/2016  . Depression (emotion) 03/24/2016  . Obesity 08/14/2015  . Essential hypertension, benign 04/29/2015  . Hyperlipidemia 04/29/2015    Orientation RESPIRATION BLADDER Height & Weight     Self, Time, Situation, Place  Normal Continent Weight: 175 lb 9.6 oz (79.7 kg) Height:  5\' 5"  (165.1 cm)  BEHAVIORAL SYMPTOMS/MOOD NEUROLOGICAL BOWEL NUTRITION STATUS      Continent Diet (Heart Healthy)  AMBULATORY STATUS COMMUNICATION OF NEEDS Skin   Limited Assist Verbally Normal                       Personal Care Assistance Level of Assistance  Bathing, Feeding, Dressing Bathing Assistance: Limited assistance Feeding assistance: Independent Dressing Assistance: Limited assistance     Functional Limitations Info  Sight, Hearing, Speech Sight Info: Adequate Hearing Info: Adequate Speech Info: Adequate    SPECIAL CARE FACTORS FREQUENCY  PT (By licensed PT), OT (By licensed OT)     PT Frequency: 5x week OT Frequency: 5x week             Contractures Contractures Info: Not present    Additional Factors Info  Code Status Code Status Info: Full Code             Current Medications (06/24/2016):  This is the current hospital active medication list Current Facility-Administered Medications  Medication Dose Route Frequency Provider Last Rate Last Dose  . 0.45 % sodium chloride infusion   Intravenous Continuous PRN John Giovanni, PA-C 20 mL/hr at 06/17/16 1320 20 mL/hr at 06/17/16 1320  . 0.9 %  sodium chloride infusion  250 mL Intravenous Continuous Wayne E Gold, PA-C      . 0.9 %  sodium chloride infusion   Intravenous Continuous Wayne E Gold, PA-C 20 mL/hr at 06/17/16 1320 20 mL/hr at 06/17/16 1320  . 0.9 %  sodium chloride infusion  250 mL Intravenous PRN Ivin Poot, MD      . amiodarone (PACERONE) tablet 200 mg  200 mg Oral BID Rexene Alberts, MD   200 mg at 06/24/16 1041  . aspirin EC tablet 325 mg  325 mg Oral Daily John Giovanni, PA-C   325 mg at 06/24/16 1041  . bisacodyl (DULCOLAX) EC tablet 10 mg  10 mg Oral Daily John Giovanni, PA-C   10 mg at 06/24/16 1041   Or  . bisacodyl (DULCOLAX) suppository 10 mg  10 mg Rectal Daily Wayne E Gold, PA-C      . docusate sodium (COLACE) capsule 200 mg  200 mg Oral Daily Wilder Glade Gold, PA-C   200 mg at 06/24/16 1041  . guaiFENesin (MUCINEX) 12 hr tablet 600 mg  600 mg Oral BID Ivin Poot, MD   600 mg at 06/24/16 1041  . hydrochlorothiazide (HYDRODIURIL) tablet 25 mg  25 mg Oral Daily Ivin Poot, MD   25 mg at 06/24/16 1041  . lisinopril (PRINIVIL,ZESTRIL) tablet 20 mg  20 mg Oral Daily Ivin Poot, MD   20 mg at 06/24/16 1041  . magnesium hydroxide (MILK OF MAGNESIA) suspension 30 mL  30 mL Oral Daily PRN Ivin Poot, MD   30 mL at 06/19/16 0931  . metoCLOPramide (REGLAN) injection 10 mg  10 mg Intravenous Q6H Ivin Poot, MD   10 mg at 06/23/16 2310  . metoprolol (LOPRESSOR) injection 2.5-5 mg  2.5-5 mg Intravenous Q2H PRN Wayne E Gold, PA-C      .  metoprolol succinate (TOPROL-XL) 24 hr tablet 50 mg  50 mg Oral Daily Rexene Alberts, MD   50 mg at 06/24/16 1041  . ondansetron (ZOFRAN) injection 4 mg  4 mg Intravenous Q6H PRN John Giovanni, PA-C   4 mg at 06/17/16 2027  . oxyCODONE (Oxy IR/ROXICODONE) immediate release tablet 5-10 mg  5-10 mg Oral Q3H PRN Wayne E Gold, PA-C   10 mg at 06/24/16 1041  . pantoprazole (PROTONIX) EC tablet 40 mg  40 mg Oral Daily Wayne E Gold, PA-C   40 mg at 06/24/16 1041  . potassium chloride SA (K-DUR,KLOR-CON) CR tablet 20 mEq  20 mEq Oral Daily Rexene Alberts, MD   20 mEq at 06/24/16 1041  . simvastatin (ZOCOR) tablet 20 mg  20 mg Oral QHS Wayne E Gold, PA-C   20 mg at 06/23/16 2305  . sodium chloride flush (NS) 0.9 % injection 3 mL  3 mL Intravenous Q12H Wayne E Gold, PA-C   3 mL at 06/24/16 1042  . sodium chloride flush (NS) 0.9 % injection 3 mL  3 mL Intravenous PRN Wayne E Gold, PA-C      . sodium chloride flush (NS) 0.9 % injection 3 mL  3 mL Intravenous Q12H Ivin Poot, MD   3 mL at 06/22/16 1100  . sodium chloride flush (NS) 0.9 % injection 3 mL  3 mL Intravenous PRN Ivin Poot, MD      . traMADol Veatrice Bourbon) tablet 50-100 mg  50-100 mg Oral Q4H PRN John Giovanni, PA-C   100 mg at 06/23/16 2305     Discharge Medications: Please see discharge summary for a list of discharge medications.  Relevant Imaging Results:  Relevant Lab Results:   Additional Information SSN: [id number]  Wende Neighbors, LCSW

## 2016-06-24 NOTE — Clinical Social Work Note (Signed)
Clinical Social Work Assessment  Patient Details  Name: Donna Woodward MRN: [id number] Date of Birth: 04-29-1956  Date of referral:  06/24/16               Reason for consult:  Abuse/Neglect, Discharge Planning                Permission sought to share information with:  Family Supports Permission granted to share information::  Yes, Verbal Permission Granted  Name::     Cindra Presume  Agency::     Relationship::  niece  Contact Information:  [phone]  Housing/Transportation Living arrangements for the past 2 months:  Single Family Home Source of Information:  Patient Patient Interpreter Needed:  None Criminal Activity/Legal Involvement Pertinent to Current Situation/Hospitalization:  No - Comment as needed Significant Relationships:  Friend, Other Family Members Lives with:  Self Do you feel safe going back to the place where you live?  Yes Need for family participation in patient care:  No (Coment)  Care giving concerns:  No family/friends at bedside. patient has friends and family that are supportive of her  Social Worker assessment / plan:  Holiday representative met patient at bedside to offer support and discuss patients need at discharge. Patient states she lives at home by herself and has never been to skilled nursing Facility. Patient is agreeable to go to SNF and prefers placement in [place] area. CSW to complete necessary paperwork and initiate SNF search on patient behalf. CSW to follow up with patient once bed offers are available. CSW remains available for support and to facilitate patient discharge needs once medically stable.  Employment status:  Retired Forensic scientist:  Medicare PT Recommendations:  [place] / Referral to community resources:  [place]  Patient/Family's Response to care: Patient verbalized appreciation and understanding for CSW role and involvement in care. Patient agreeable with current  discharge plan to SNF following discharge.   Patient/Family's Understanding of and Emotional Response to Diagnosis, Current Treatment, and Prognosis:  Patient with good understanding of current medical state and limitations around most recent hospitalization. patient is agreeable with SNF placement in hopes of transitioning back home.   Emotional Assessment Appearance:  Appears stated age Attitude/Demeanor/Rapport:   (attitude/demeanor appropriate) Affect (typically observed):  Pleasant Orientation:  Oriented to Self, Oriented to Place, Oriented to  Time, Oriented to Situation Alcohol / Substance use:  Not Applicable Psych involvement (Current and /or in the community):  No (Comment)  Discharge Needs  Concerns to be addressed:  No discharge needs identified Readmission within the last 30 days:  No Current discharge risk:  None Barriers to Discharge:  No Barriers Identified   Rhea Pink, MSW,  [place]

## 2016-06-24 NOTE — Progress Notes (Addendum)
      [address]       [place] [address]             [phone]        7 Days Post-Op Procedure(s) (LRB): CORONARY ARTERY BYPASS GRAFTING (CABG), ON PUMP, TIMES THREE, USING LEFT INTERNAL MAMMARY ARTERY, RIGHT GREATER SAPHENOUS VEIN HARVESTED ENDOSCOPICALLY (N/A) TRANSESOPHAGEAL ECHOCARDIOGRAM (TEE) (N/A)  Subjective: Patient overall, feeling better and stronger. Her only pain is lateral to sternal wound on the left  Objective: Vital signs in last 24 hours: Temp:  [97.9 F (36.6 C)-98.4 F (36.9 C)] 98.2 F (36.8 C) (12/26 0500) Pulse Rate:  [77-81] 80 (12/26 0500) Cardiac Rhythm: Normal sinus rhythm (12/25 1900) Resp:  [18] 18 (12/26 0500) BP: (106-120)/(64-68) 118/66 (12/26 0500) SpO2:  [92 %-95 %] 92 % (12/26 0500) Weight:  [175 lb 9.6 oz (79.7 kg)] 175 lb 9.6 oz (79.7 kg) (12/26 0500)  Pre op weight 83 kg Current Weight  06/24/16 175 lb 9.6 oz (79.7 kg)       Intake/Output from previous day: 12/25 0701 - 12/26 0700 In: 240 [P.O.:240] Out: -    Physical Exam:  Cardiovascular: RRR Pulmonary: Mostly clear Abdomen: Soft, non tender, bowel sounds present. Extremities: Trace bilateral lower extremity edema. Wounds: Clean and dry.  Lab Results: CBC:No results for input(s): WBC, HGB, HCT, PLT in the last 72 hours. BMET:   Recent Labs  06/22/16 0316  NA 138  K 3.7  CL 105  CO2 25  GLUCOSE 126*  BUN 15  CREATININE 0.56  CALCIUM 8.4*    PT/INR:  Lab Results  Component Value Date   INR 1.23 06/17/2016   INR 1.00 06/16/2016   INR 0.97 06/04/2016   ABG:  INR: Will add last result for INR, ABG once components are confirmed Will add last 4 CBG results once components are confirmed  Assessment/Plan:  1. CV - Previous PVCS.SR in the 80's. On Toprol XL 50 mg daily, Amiodarone 200 mg bid. Lisinopril 20 mg daily, and HCTZ 25 mg daily. 2.  Pulmonary - On room air.Encourage incentive spirometer. 3.  Acute blood loss anemia - H and H  stable at 9.8 and 29.7 6. To SNF when bed available  ZIMMERMAN,DONIELLE MPA-C 06/24/2016,7:51 AM Patient seen and examined, agree with assessment and plan above  Remo Lipps C. Roxan Hockey, MD [facility] [phone]

## 2016-06-24 NOTE — Progress Notes (Signed)
Pt. Discharged to [facility]  Discharge information reviewed and given All personal belongings given to Pt.  Education discussed IV was d/c Tele d/c  report given to receiving RN

## 2016-06-25 DIAGNOSIS — I10 Essential (primary) hypertension: Secondary | ICD-10-CM | POA: Diagnosis not present

## 2016-06-25 DIAGNOSIS — R531 Weakness: Secondary | ICD-10-CM | POA: Diagnosis not present

## 2016-06-25 DIAGNOSIS — I25810 Atherosclerosis of coronary artery bypass graft(s) without angina pectoris: Secondary | ICD-10-CM | POA: Diagnosis not present

## 2016-06-25 DIAGNOSIS — E785 Hyperlipidemia, unspecified: Secondary | ICD-10-CM | POA: Diagnosis not present

## 2016-06-27 DIAGNOSIS — R079 Chest pain, unspecified: Secondary | ICD-10-CM | POA: Diagnosis not present

## 2016-06-27 DIAGNOSIS — R031 Nonspecific low blood-pressure reading: Secondary | ICD-10-CM | POA: Diagnosis not present

## 2016-06-27 DIAGNOSIS — I25810 Atherosclerosis of coronary artery bypass graft(s) without angina pectoris: Secondary | ICD-10-CM | POA: Diagnosis not present

## 2016-06-27 DIAGNOSIS — M6281 Muscle weakness (generalized): Secondary | ICD-10-CM | POA: Diagnosis not present

## 2016-06-27 DIAGNOSIS — R202 Paresthesia of skin: Secondary | ICD-10-CM | POA: Diagnosis not present

## 2016-07-02 ENCOUNTER — Ambulatory Visit: Payer: Medicare Other | Admitting: Cardiology

## 2016-07-09 DIAGNOSIS — I2510 Atherosclerotic heart disease of native coronary artery without angina pectoris: Secondary | ICD-10-CM | POA: Diagnosis not present

## 2016-07-09 DIAGNOSIS — Z48812 Encounter for surgical aftercare following surgery on the circulatory system: Secondary | ICD-10-CM | POA: Diagnosis not present

## 2016-07-09 DIAGNOSIS — M1991 Primary osteoarthritis, unspecified site: Secondary | ICD-10-CM | POA: Diagnosis not present

## 2016-07-09 DIAGNOSIS — I10 Essential (primary) hypertension: Secondary | ICD-10-CM | POA: Diagnosis not present

## 2016-07-11 DIAGNOSIS — Z48812 Encounter for surgical aftercare following surgery on the circulatory system: Secondary | ICD-10-CM | POA: Diagnosis not present

## 2016-07-11 DIAGNOSIS — I2510 Atherosclerotic heart disease of native coronary artery without angina pectoris: Secondary | ICD-10-CM | POA: Diagnosis not present

## 2016-07-14 DIAGNOSIS — Z48812 Encounter for surgical aftercare following surgery on the circulatory system: Secondary | ICD-10-CM | POA: Diagnosis not present

## 2016-07-14 DIAGNOSIS — I2510 Atherosclerotic heart disease of native coronary artery without angina pectoris: Secondary | ICD-10-CM | POA: Diagnosis not present

## 2016-07-17 DIAGNOSIS — I2510 Atherosclerotic heart disease of native coronary artery without angina pectoris: Secondary | ICD-10-CM | POA: Diagnosis not present

## 2016-07-17 DIAGNOSIS — Z48812 Encounter for surgical aftercare following surgery on the circulatory system: Secondary | ICD-10-CM | POA: Diagnosis not present

## 2016-07-17 NOTE — Progress Notes (Signed)
Cardiology Office Note    Date:  07/23/2016   ID:  [name], DOB Jan 19, 1956, MRN [id number]  PCP:  Raylene Everts, MD  Cardiologist:  Dr. Johnsie Cancel  Chief Complaint: Hospital follow up s/p CABG  History of Present Illness:   Donna Woodward is a 61 y.o. female with HTN, HLD, previous smoker and recent CABG presents for follow up.   Seen by Dr. Johnsie Cancel for initial consult for chest pain 05/07/16. Nuclear stress test 05/20/16 showed no diagnostic ST segment abnormalities, rare PVCs, moderate sized, moderate intensity, reversible inferolateral defect from the apex to the base with partial reversibility at the very base suggestive of ischemia. It was considered an intermediate risk study LVEF 69%.  F/u Cath 06/05/16 showed 3V disease s/p CABG x 3 (LIMA to LAD; SVG to posterior descending; SVG to diagonal). The patient was suffered ventricular ectopy.  Her beta blocker was increased and she was started on Amiodarone.  Per discharge summaries, amiodarone will be short term. She required diuresis for volume overload.   Here today for follow up. She is going and down hill without any shortness of breath or chest discomfort. The patient denies chest pain, shortness of breath, palpitation, orthopnea, PND, syncope, lower extremity edema, melena, blood in her stool and urine. She has been compliant with her medication. She only takes aspirin 81 mg. She has cut back by herself. Her BP runs in 120 to 130s/70s to 80s during home health RN and PT visit.  Past Medical History:  Diagnosis Date  . Anxiety   . Back pain   . Coronary artery disease   . Degenerative joint disease (DJD) of lumbar spine 03/24/2016  . Depression   . GERD (gastroesophageal reflux disease)    if overweight  . Hyperlipidemia   . Hypertension     Past Surgical History:  Procedure Laterality Date  . BACK SURGERY    . CARDIAC CATHETERIZATION N/A 06/05/2016   Procedure: Left Heart Cath and Coronary Angiography;  Surgeon:  Belva Crome, MD;  Location: [facility];  Service: Cardiovascular;  Laterality: N/A;  . CORONARY ARTERY BYPASS GRAFT N/A 06/17/2016   Procedure: CORONARY ARTERY BYPASS GRAFTING (CABG), ON PUMP, TIMES THREE, USING LEFT INTERNAL MAMMARY ARTERY, RIGHT GREATER SAPHENOUS VEIN HARVESTED ENDOSCOPICALLY;  Surgeon: Ivin Poot, MD;  Location: [facility];  Service: Open Heart Surgery;  Laterality: N/A;  . MULTIPLE TOOTH EXTRACTIONS    . [place]   lamnectomy L5S1  . TEE WITHOUT CARDIOVERSION N/A 06/17/2016   Procedure: TRANSESOPHAGEAL ECHOCARDIOGRAM (TEE);  Surgeon: Ivin Poot, MD;  Location: [facility];  Service: Open Heart Surgery;  Laterality: N/A;    Current Medications: Prior to Admission medications   Medication Sig Start Date End Date Taking? Authorizing Provider  amiodarone (PACERONE) 200 MG tablet Take 1 tablet (200 mg total) by mouth 2 (two) times daily. For one week;then take Amiodarone 200 mg daily by mouth thereafter 06/24/16   Nani Skillern, PA-C  aspirin EC 325 MG EC tablet Take 1 tablet (325 mg total) by mouth daily. 06/24/16   Donielle Liston Alba, PA-C  guaiFENesin (MUCINEX) 600 MG 12 hr tablet Take 1 tablet (600 mg total) by mouth 2 (two) times daily as needed for cough. 06/24/16   Donielle Liston Alba, PA-C  lisinopril-hydrochlorothiazide (PRINZIDE,ZESTORETIC) 20-25 MG tablet Take 1 tablet by mouth daily. Patient taking differently: Take 1 tablet by mouth daily. Taken in the morning 03/24/16   Raylene Everts, MD  metoprolol succinate (TOPROL-XL) 50 MG 24 hr tablet Take 1 tablet (50 mg total) by mouth daily. Take with or immediately following a meal. Patient taking differently: Take 50 mg by mouth. Take with or immediately following a meal; in the morning 03/24/16   Raylene Everts, MD  omega-3 fish oil (MAXEPA) 1000 MG CAPS capsule Take 1 capsule (1,000 mg total) by mouth 2 (two) times daily. Reported on 11/14/2015 03/24/16   Raylene Everts, MD  potassium  chloride SA (K-DUR,KLOR-CON) 20 MEQ tablet Take 1 tablet (20 mEq total) by mouth daily. Patient taking differently: Take 20 mEq by mouth daily. Taken at night 06/04/16   Imogene Burn, PA-C  simvastatin (ZOCOR) 20 MG tablet Take 1 tablet (20 mg total) by mouth at bedtime. 03/24/16   Raylene Everts, MD  traMADol (ULTRAM) 50 MG tablet Take 1 tablet (50 mg total) by mouth every 4 (four) hours as needed for moderate pain. 06/24/16   Donielle Liston Alba, PA-C    Allergies:   Patient has no known allergies.   Social History   Social History  . Marital status: Single    Spouse name: N/A  . Number of children: N/A  . Years of education: 67   Occupational History  . disabled     warehouse   Social History Main Topics  . Smoking status: Former Smoker    Quit date: 06/30/2006  . Smokeless tobacco: Never Used  . Alcohol use Yes     Comment: rare  . Drug use: Yes    Types: Marijuana  . Sexual activity: No   Other Topics Concern  . None   Social History Narrative   Masters in Designer, television/film set alone   Disabled with back pain     Family History:  The patient's family history includes CAD (age of onset: 72) in her sister; Cancer (age of onset: 22) in her sister; Dementia in her father; Heart disease in her father; Hypertension in her mother, sister, sister, and sister; Stroke in her father.   ROS:   Please see the history of present illness.    ROS All other systems reviewed and are negative.   PHYSICAL EXAM:   VS:  BP (!) 160/80 (BP Location: Right Arm, Patient Position: Sitting, Cuff Size: Normal)   Pulse 72   Ht 5\' 5"  (1.651 m)   Wt 177 lb (80.3 kg)   BMI 29.45 kg/m    GEN: Well nourished, well developed, in no acute distress  HEENT: normal  Neck: no JVD, carotid bruits, or masses Cardiac: RRR; no murmurs, rubs, or gallops,no edema. Substernal scar healing well.  Respiratory:  clear to auscultation bilaterally, normal work of breathing GI: soft, nontender,  nondistended, + BS MS: no deformity or atrophy  Skin: warm and dry, no rash Neuro:  Alert and Oriented x 3, Strength and sensation are intact Psych: euthymic mood, full affect  Wt Readings from Last 3 Encounters:  07/23/16 177 lb (80.3 kg)  06/24/16 175 lb 9.6 oz (79.7 kg)  06/16/16 182 lb 9.6 oz (82.8 kg)      Studies/Labs Reviewed:   EKG:  EKG is not ordered today.    Recent Labs: 06/16/2016: ALT 19 06/18/2016: Magnesium 2.1 06/20/2016: Hemoglobin 9.8; Platelets 152 06/22/2016: BUN 15; Creatinine, Ser 0.56; Potassium 3.7; Sodium 138   Lipid Panel    Component Value Date/Time   CHOL 171 11/06/2015 1126   TRIG 129 11/06/2015 1126   HDL 33 (L) 11/06/2015  1126   CHOLHDL 5.2 (H) 11/06/2015 1126   VLDL 26 11/06/2015 1126   LDLCALC 112 11/06/2015 1126    Additional studies/ records that were reviewed today include:   TEE 06/17/16 Result status: Final result   Left ventricle: Normal cavity size and wall thickness. LV systolic function is low normal with an EF of 50-55%. No thrombus present. No mass present.  Septum: No Patent Foramen Ovale present. The interatrial septum bows to the left consistent with high right atrial pressure.  Left atrium: Patent foramen ovale not present.  Aortic valve: The valve is trileaflet. Mild valve calcification present. No stenosis. No stenosis by continuous wave doppler Trace regurgitation.  Mitral valve: Mild leaflet thickening is present. Trace regurgitation.  Right ventricle: Normal cavity size and ejection fraction. There is mild hypertrophy.  Tricuspid valve: Mild regurgitation. The tricuspid valve regurgitation jet is central.     Cardiac Catheterization:  06/05/16 Left Heart Cath and Coronary Angiography  Conclusion    Severe calcific, diffuse three-vessel coronary disease.  Codominant LAD/diagonal system with severe diffuse disease in each vessel with graftable distal targets.  Eccentric 30-60% ostial left main depending  upon review.  99% proximal to mid RCA. Total occlusion at the ostium of a large PDA that fills left-to-right collaterals.  Moderate mid circumflex disease but without graftable targets beyond the stenosis.  Normal left ventricular systolic function with normal hemodynamics   RECOMMENDATIONS:   Continue current medical regimen.  Expedient evaluation by [facility] for multivessel coronary bypass grafting.       ASSESSMENT & PLAN:    1. CAD s/p CABG x 3 - No anginal pain. Continue ASA and Statin. She is taking ASA 81mg  current, discharge summery indicates that she was discharged on 325mg . She will discuss with Dr. Prescott Gum later today regarding dosage.   2. HTN - Initial BP of 160/80. Repeat check 140/90. Her blood pressure has been running normal at home. Keep log. Continue current regimen.   3. HLD - 11/06/2015: Cholesterol 171; HDL 33; LDL Cholesterol 112; Triglycerides 129; VLDL 26  - Continue statin. Due for LFT and lipid panel  --> she will f/u with PCP.   4. PAVc - Increased burdon during admission leading to increase dose of BB and addition of amiodarone short term. Will discuss with Dr,. Nishan regarding length of therapy of amiodarone.     Medication Adjustments/Labs and Tests Ordered: Current medicines are reviewed at length with the patient today.  Concerns regarding medicines are outlined above.  Medication changes, Labs and Tests ordered today are listed in the Patient Instructions below. Patient Instructions  Medication Instructions:   Your physician recommends that you continue on your current medications as directed. Please refer to the Current Medication list given to you today.   If you need a refill on your cardiac medications before your next appointment, please call your pharmacy.  Labwork: NONE ORDERED  TODAY    Testing/Procedures: NONE ORDERED  TODAY    Follow-Up: IN 3 MONTHS WITH NISHAN     Any Other Special Instructions Will Be Listed  Below (If Applicable).  Jarrett Soho, [place]  07/23/2016 1:39 PM    [facility] [place], [place], [place]  [address] Phone: [phone]; Fax: [phone]

## 2016-07-18 DIAGNOSIS — I2510 Atherosclerotic heart disease of native coronary artery without angina pectoris: Secondary | ICD-10-CM | POA: Diagnosis not present

## 2016-07-18 DIAGNOSIS — Z48812 Encounter for surgical aftercare following surgery on the circulatory system: Secondary | ICD-10-CM | POA: Diagnosis not present

## 2016-07-21 ENCOUNTER — Other Ambulatory Visit: Payer: Self-pay | Admitting: Cardiothoracic Surgery

## 2016-07-21 DIAGNOSIS — Z48812 Encounter for surgical aftercare following surgery on the circulatory system: Secondary | ICD-10-CM | POA: Diagnosis not present

## 2016-07-21 DIAGNOSIS — I2510 Atherosclerotic heart disease of native coronary artery without angina pectoris: Secondary | ICD-10-CM | POA: Diagnosis not present

## 2016-07-21 DIAGNOSIS — Z951 Presence of aortocoronary bypass graft: Secondary | ICD-10-CM

## 2016-07-22 ENCOUNTER — Ambulatory Visit: Payer: Medicare Other | Admitting: Physician Assistant

## 2016-07-22 DIAGNOSIS — Z48812 Encounter for surgical aftercare following surgery on the circulatory system: Secondary | ICD-10-CM | POA: Diagnosis not present

## 2016-07-22 DIAGNOSIS — I2510 Atherosclerotic heart disease of native coronary artery without angina pectoris: Secondary | ICD-10-CM | POA: Diagnosis not present

## 2016-07-23 ENCOUNTER — Ambulatory Visit (INDEPENDENT_AMBULATORY_CARE_PROVIDER_SITE_OTHER): Payer: Medicare Other | Admitting: Physician Assistant

## 2016-07-23 ENCOUNTER — Telehealth: Payer: Self-pay

## 2016-07-23 ENCOUNTER — Ambulatory Visit (INDEPENDENT_AMBULATORY_CARE_PROVIDER_SITE_OTHER): Payer: Self-pay | Admitting: Cardiothoracic Surgery

## 2016-07-23 ENCOUNTER — Encounter: Payer: Self-pay | Admitting: Physician Assistant

## 2016-07-23 ENCOUNTER — Ambulatory Visit
Admission: RE | Admit: 2016-07-23 | Discharge: 2016-07-23 | Disposition: A | Discharge status: Home / Self Care | Payer: Medicare Other | Source: Ambulatory Visit | Attending: Cardiothoracic Surgery | Admitting: Cardiothoracic Surgery

## 2016-07-23 ENCOUNTER — Encounter (INDEPENDENT_AMBULATORY_CARE_PROVIDER_SITE_OTHER): Payer: Self-pay

## 2016-07-23 VITALS — BP 140/70 | HR 70 | Resp 20 | Ht 65.0 in | Wt 177.0 lb

## 2016-07-23 VITALS — BP 160/80 | HR 72 | Ht 65.0 in | Wt 177.0 lb

## 2016-07-23 DIAGNOSIS — Z951 Presence of aortocoronary bypass graft: Secondary | ICD-10-CM | POA: Diagnosis not present

## 2016-07-23 DIAGNOSIS — I2510 Atherosclerotic heart disease of native coronary artery without angina pectoris: Secondary | ICD-10-CM

## 2016-07-23 DIAGNOSIS — Z79899 Other long term (current) drug therapy: Secondary | ICD-10-CM

## 2016-07-23 DIAGNOSIS — R200 Anesthesia of skin: Secondary | ICD-10-CM | POA: Diagnosis not present

## 2016-07-23 DIAGNOSIS — I10 Essential (primary) hypertension: Secondary | ICD-10-CM | POA: Diagnosis not present

## 2016-07-23 DIAGNOSIS — E785 Hyperlipidemia, unspecified: Secondary | ICD-10-CM | POA: Diagnosis not present

## 2016-07-23 MED ORDER — ATORVASTATIN CALCIUM 10 MG PO TABS: 10.0000 mg | ORAL_TABLET | Freq: Every day | ORAL | Status: DC

## 2016-07-23 NOTE — Progress Notes (Signed)
Continue amiodarone 3 months then can do 38 hour holter to quantify PVCls

## 2016-07-23 NOTE — Patient Instructions (Signed)
Medication Instructions:   Your physician recommends that you continue on your current medications as directed. Please refer to the Current Medication list given to you today.   If you need a refill on your cardiac medications before your next appointment, please call your pharmacy.  Labwork: NONE ORDERED  TODAY    Testing/Procedures: NONE ORDERED  TODAY    Follow-Up: IN 3 MONTHS WITH NISHAN     Any Other Special Instructions Will Be Listed Below (If Applicable).

## 2016-07-23 NOTE — Telephone Encounter (Signed)
Called patient and informed her of Dr. Kyla Balzarine recommendations. Put in order for patient to have holter monitor in 3 months. Patient will stop Amiodarone on 10/21/16. Will send message to [facility] to call patient to schedule holter monitor. Patient verbalized understanding.

## 2016-07-23 NOTE — Patient Instructions (Addendum)
You may return to driving an automobile as long as you are no longer requiring oral narcotic pain relievers during the daytime.  It would be wise to start driving only short distances during the daylight and gradually increase from there as you feel comfortable. ? ?Make every effort to maintain a "heart-healthy" lifestyle with regular physical exercise and adherence to a low-fat, low-carbohydrate diet.  Continue to seek regular follow-up appointments with your primary care physician and/or cardiologist. ? ?You may continue to gradually increase your physical activity as tolerated.  Refrain from any heavy lifting or strenuous use of your arms and shoulders until at least 8 weeks from the time of your surgery, and avoid activities that cause increased pain in your chest on the side of your surgical incision.  Otherwise you may continue to increase activities without any particular limitations.  Increase the intensity and duration of physical activity gradually. ? ?You are encouraged to enroll and participate in the outpatient cardiac rehab program beginning as soon as practical. ? ?

## 2016-07-23 NOTE — Progress Notes (Addendum)
  HPI:  Patient returns for routine postoperative follow-up having undergone a CABG x 3 on 06/17/2016 by Dr. Prescott Gum. She was discharged to SNF in stable condition on 06/24/2016. The patient's early postoperative recovery while in the hospital was notable for ventricular ectopy. Since hospital discharge ,the patient reports she is feeling fairly well overall. She is emotional at times. She denies chest pain or shortness of breath.  Current Outpatient Prescriptions  Medication Sig Dispense Refill  . amiodarone (PACERONE) 200 MG tablet Take 1 tablet (200 mg total) by mouth 2 (two) times daily. For one week;then take Amiodarone 200 mg daily by mouth thereafter    . aspirin EC 325 MG EC tablet Take 1 tablet (325 mg total) by mouth daily. 30 tablet 0  . lisinopril-hydrochlorothiazide (PRINZIDE,ZESTORETIC) 20-25 MG tablet Take 1 tablet by mouth every evening.    . metoprolol succinate (TOPROL-XL) 50 MG 24 hr tablet Take 50 mg by mouth daily. Take with or immediately following a meal.    . omega-3 fish oil (MAXEPA) 1000 MG CAPS capsule Take 1 capsule (1,000 mg total) by mouth 2 (two) times daily. Reported on 11/14/2015 180 each 1  . potassium chloride SA (K-DUR,KLOR-CON) 20 MEQ tablet Take 20 mEq by mouth every evening.    . simvastatin (ZOCOR) 20 MG tablet Take 1 tablet (20 mg total) by mouth at bedtime. 90 tablet 1  Vital Signs: BP 140/70, HR 70, RR 20, Oxygen saturation 99% on room air   Physical Exam: CV-RRR Pulmonary-Clear to auscultation bilaterally Extremities-No lower extremity edema Wounds-Clean and dry. Eschars on lower extremity wounds. Several pieces of vicril sutures removed.  Diagnostic Tests: CLINICAL DATA:  Anterior chest numbness to the LEFT of incision for CABG June 17, 2016. History of hypertension, hyperlipidemia.  EXAM: CHEST  2 VIEW  COMPARISON:  Chest radiograph June 20, 2016  FINDINGS: Cardiac silhouette is upper limits of normal in size, status  post median sternotomy for CABG. Mildly calcified aortic knob. No pleural effusion or focal consolidation. Slight blunting of LEFT costophrenic angle compatible with pleural thickening. No pneumothorax. Mild degenerative change of the spine. Soft tissue planes are nonsuspicious.  IMPRESSION: Borderline cardiomegaly.  No acute pulmonary process.   Electronically Signed   By: Elon Alas M.D.   On: 07/23/2016 14:32  Impression and Plan: Overall, patient is recovering well . She was discharged from the facility over one week ago. She has seen cardiology earlier today. I clarified with her that she should be taking ecasa 325 mg daily not 81 mg daily. Also, the faciltity gave her a prescription for Lipitor 10 mg daily and she was on Zocor 20 mg daily. She was instructed to take Lipitor as she is still on low dose Amiodarone. Hopefully, cardiology will stop Amiodarone soon. She has a follow up appointment to see Dr. Johnsie Cancel in May. She is not taking Ultram anymore. She was instructed she may begin driving short distances (i.e. 30 minutes or less during the day) and gradually increase the frequency and duration as tolerates. She is to continue with sternal precautions (i.e. No lifting more than 10 pounds until seen in the office in 2-3 weeks). She has 2 more weeks of PT at her home. I encouraged her to participate in cardiac rehab, but she is not interested at this time. She will return to see Dr. Prescott Gum in 2-3 weeks.    Nani Skillern, PA-C [facility] [phone]

## 2016-07-23 NOTE — Telephone Encounter (Signed)
Donna Hector, MD at 07/23/2016 1:30 PM   Status: Signed    Continue amiodarone 3 months then can do 38 hour holter to quantify PVCls      Called and left message for patient to call back.

## 2016-07-23 NOTE — Addendum Note (Signed)
Addended by: Nani Skillern on: 07/23/2016 04:01 PM   Modules accepted: Orders

## 2016-07-24 ENCOUNTER — Encounter: Payer: Self-pay | Admitting: Family Medicine

## 2016-07-24 ENCOUNTER — Ambulatory Visit (INDEPENDENT_AMBULATORY_CARE_PROVIDER_SITE_OTHER): Payer: Medicare Other | Admitting: Family Medicine

## 2016-07-24 VITALS — BP 116/70 | HR 72 | Temp 97.9°F | Resp 18 | Ht 65.0 in | Wt 177.1 lb

## 2016-07-24 DIAGNOSIS — E785 Hyperlipidemia, unspecified: Secondary | ICD-10-CM | POA: Diagnosis not present

## 2016-07-24 DIAGNOSIS — I2510 Atherosclerotic heart disease of native coronary artery without angina pectoris: Secondary | ICD-10-CM

## 2016-07-24 DIAGNOSIS — Z951 Presence of aortocoronary bypass graft: Secondary | ICD-10-CM | POA: Diagnosis not present

## 2016-07-24 DIAGNOSIS — I10 Essential (primary) hypertension: Secondary | ICD-10-CM | POA: Diagnosis not present

## 2016-07-24 DIAGNOSIS — Z48812 Encounter for surgical aftercare following surgery on the circulatory system: Secondary | ICD-10-CM | POA: Diagnosis not present

## 2016-07-24 MED ORDER — AMIODARONE HCL 200 MG PO TABS: 200.0000 mg | ORAL_TABLET | Freq: Every day | ORAL | 0 refills | Status: DC

## 2016-07-24 NOTE — Patient Instructions (Signed)
Drink lots of water Medicine as directed Activity as tolerated Labs today  See me in 3 months Call sooner for problems

## 2016-07-24 NOTE — Progress Notes (Signed)
Chief Complaint  Patient presents with  . Follow-up   Last time I saw Pt she had chest pain.  Since then has seen cardiology, had an abnormal stress test and cath, and then a CABG x 3.  She is great.  Walk ing every day.  No pain.  No dyspnea.  Leg wounds healing.  Sternal wound healed.   BP Is good Due for lipid check Still refusing flu shot and prevnar Has lost 13 lbs.   Patient Active Problem List   Diagnosis Date Noted  . S/P CABG x 3 06/17/2016  . Family history of early CAD 06/04/2016  . Chest pain 06/04/2016  . Abnormal nuclear stress test 06/04/2016  . Degenerative joint disease (DJD) of lumbar spine 03/24/2016  . Depression (emotion) 03/24/2016  . Obesity 08/14/2015  . Essential hypertension, benign 04/29/2015  . Hyperlipidemia 04/29/2015    Outpatient Encounter Prescriptions as of 07/24/2016  Medication Sig  . amiodarone (PACERONE) 200 MG tablet Take 1 tablet (200 mg total) by mouth daily. Take one daily  . aspirin EC 325 MG EC tablet Take 1 tablet (325 mg total) by mouth daily.  Marland Kitchen atorvastatin (LIPITOR) 10 MG tablet Take 1 tablet (10 mg total) by mouth daily.  Marland Kitchen lisinopril-hydrochlorothiazide (PRINZIDE,ZESTORETIC) 20-25 MG tablet Take 1 tablet by mouth every evening.  . metoprolol succinate (TOPROL-XL) 50 MG 24 hr tablet Take 50 mg by mouth daily. Take with or immediately following a meal.  . omega-3 fish oil (MAXEPA) 1000 MG CAPS capsule Take 1 capsule (1,000 mg total) by mouth 2 (two) times daily. Reported on 11/14/2015  . potassium chloride SA (K-DUR,KLOR-CON) 20 MEQ tablet Take 20 mEq by mouth every evening.   No facility-administered encounter medications on file as of 07/24/2016.     No Known Allergies  Review of Systems  Constitutional: Negative for activity change, appetite change and fatigue.  HENT: Negative for congestion and postnasal drip.   Eyes: Negative for redness, itching and visual disturbance.  Respiratory: Negative for cough, choking and  shortness of breath.   Cardiovascular: Negative for chest pain, palpitations and leg swelling.  Gastrointestinal: Negative for constipation and diarrhea.  Genitourinary: Negative for difficulty urinating and frequency.  Musculoskeletal: Positive for arthralgias and back pain.       Using a cane today  Neurological: Negative for dizziness and headaches.  Psychiatric/Behavioral: Positive for dysphoric mood. Negative for self-injury, sleep disturbance and suicidal ideas. The patient is not nervous/anxious.        At times    BP 116/70 (BP Location: Right Arm, Patient Position: Sitting, Cuff Size: Normal)   Pulse 72   Temp 97.9 F (36.6 C) (Oral)   Resp 18   Ht 5\' 5"  (1.651 m)   Wt 177 lb 1.9 oz (80.3 kg)   SpO2 97%   BMI 29.47 kg/m   Physical Exam  Constitutional: She is oriented to person, place, and time. She appears well-developed and well-nourished.  HENT:  Head: Normocephalic and atraumatic.  Mouth/Throat: Oropharynx is clear and moist.  Eyes: Conjunctivae are normal. Pupils are equal, round, and reactive to light.  Neck: Normal range of motion. Neck supple. No thyromegaly present.  Cardiovascular: Normal rate, regular rhythm and normal heart sounds.   Sternal incision healed  Pulmonary/Chest: Effort normal and breath sounds normal. No respiratory distress.  Abdominal: Soft. Bowel sounds are normal.  Musculoskeletal: Normal range of motion. She exhibits edema.  Trace edema  Lymphadenopathy:    She has no cervical adenopathy.  Neurological: She is alert and oriented to person, place, and time.  Gait normal  Skin: Skin is warm and dry.  Right leg with healing incision  Psychiatric: She has a normal mood and affect. Her behavior is normal. Thought content normal.  Nursing note and vitals reviewed.   ASSESSMENT/PLAN:  1. Hyperlipidemia, unspecified hyperlipidemia type  - Lipid panel - AST - ALT  2. Coronary artery disease involving native coronary artery of native  heart without angina pectoris   3. Essential hypertension, benign controlled  4. Hx of CABG Doing very well   Patient Instructions  Drink lots of water Medicine as directed Activity as tolerated Labs today  See me in 3 months Call sooner for problems    Raylene Everts, MD

## 2016-07-25 ENCOUNTER — Encounter: Payer: Self-pay | Admitting: Family Medicine

## 2016-07-25 LAB — LIPID PANEL
Cholesterol: 128 mg/dL (ref <200)
HDL: 23 mg/dL — ABNORMAL LOW (ref >50)
LDL CALC: 67 mg/dL (ref <100)
TRIGLYCERIDES: 189 mg/dL — AB (ref <150)
Total CHOL/HDL Ratio: 5.6 Ratio — ABNORMAL HIGH (ref <5.0)
VLDL: 38 mg/dL — AB (ref <30)

## 2016-07-25 LAB — AST: AST: 11 U/L (ref 10–35)

## 2016-07-25 LAB — ALT: ALT: 10 U/L (ref 6–29)

## 2016-07-28 DIAGNOSIS — I2510 Atherosclerotic heart disease of native coronary artery without angina pectoris: Secondary | ICD-10-CM | POA: Diagnosis not present

## 2016-07-28 DIAGNOSIS — Z48812 Encounter for surgical aftercare following surgery on the circulatory system: Secondary | ICD-10-CM | POA: Diagnosis not present

## 2016-07-29 DIAGNOSIS — Z48812 Encounter for surgical aftercare following surgery on the circulatory system: Secondary | ICD-10-CM | POA: Diagnosis not present

## 2016-07-29 DIAGNOSIS — I2510 Atherosclerotic heart disease of native coronary artery without angina pectoris: Secondary | ICD-10-CM | POA: Diagnosis not present

## 2016-08-05 DIAGNOSIS — I2510 Atherosclerotic heart disease of native coronary artery without angina pectoris: Secondary | ICD-10-CM | POA: Diagnosis not present

## 2016-08-05 DIAGNOSIS — Z48812 Encounter for surgical aftercare following surgery on the circulatory system: Secondary | ICD-10-CM | POA: Diagnosis not present

## 2016-08-13 ENCOUNTER — Encounter: Payer: Self-pay | Admitting: Cardiothoracic Surgery

## 2016-08-13 ENCOUNTER — Ambulatory Visit (INDEPENDENT_AMBULATORY_CARE_PROVIDER_SITE_OTHER): Payer: Self-pay | Admitting: Cardiothoracic Surgery

## 2016-08-13 VITALS — BP 112/75 | HR 74 | Resp 16 | Ht 65.0 in | Wt 177.0 lb

## 2016-08-13 DIAGNOSIS — Z951 Presence of aortocoronary bypass graft: Secondary | ICD-10-CM

## 2016-08-13 DIAGNOSIS — I2510 Atherosclerotic heart disease of native coronary artery without angina pectoris: Secondary | ICD-10-CM

## 2016-08-13 NOTE — Progress Notes (Signed)
PCP is Raylene Everts, MD Referring Provider is Josue Hector, MD  Chief Complaint  Patient presents with  . Routine Post Op    3 wk f/u s/p CABG 06/17/16    HPI: Final visit 6 weeks after multivessel CABG Vein was harvested from the right thigh using an open technique and the incisions are carefully examined today and found to be healing adequately The patient is doing well without recurrent angina or symptoms of CHF. She is doing her rehabilitation following an heart heart healthy diet and a nonsmoking lifestyle She has maintained sinus rhythm and is ready to stop taking the amiodarone She understands that she should not lift more than 20 pounds for 6 weeks  Past Medical History:  Diagnosis Date  . Anxiety   . Back pain   . Coronary artery disease   . Degenerative joint disease (DJD) of lumbar spine 03/24/2016  . Depression   . GERD (gastroesophageal reflux disease)    if overweight  . Hyperlipidemia   . Hypertension     Past Surgical History:  Procedure Laterality Date  . BACK SURGERY    . CARDIAC CATHETERIZATION N/A 06/05/2016   Procedure: Left Heart Cath and Coronary Angiography;  Surgeon: Belva Crome, MD;  Location: [facility];  Service: Cardiovascular;  Laterality: N/A;  . CORONARY ARTERY BYPASS GRAFT N/A 06/17/2016   Procedure: CORONARY ARTERY BYPASS GRAFTING (CABG), ON PUMP, TIMES THREE, USING LEFT INTERNAL MAMMARY ARTERY, RIGHT GREATER SAPHENOUS VEIN HARVESTED ENDOSCOPICALLY;  Surgeon: Ivin Poot, MD;  Location: [facility];  Service: Open Heart Surgery;  Laterality: N/A;  . MULTIPLE TOOTH EXTRACTIONS    . [place]   lamnectomy L5S1  . TEE WITHOUT CARDIOVERSION N/A 06/17/2016   Procedure: TRANSESOPHAGEAL ECHOCARDIOGRAM (TEE);  Surgeon: Ivin Poot, MD;  Location: [place];  Service: Open Heart Surgery;  Laterality: N/A;    Family History  Problem Relation Age of Onset  . Hypertension Mother   . Stroke Father   . Heart disease Father    . Dementia Father   . Hypertension Sister   . CAD Sister 78    cabg  . Hypertension Sister   . Cancer Sister 33    rectal  . Hypertension Sister     Social History Social History  Substance Use Topics  . Smoking status: Former Smoker    Quit date: 06/30/2006  . Smokeless tobacco: Never Used  . Alcohol use Yes     Comment: rare    Current Outpatient Prescriptions  Medication Sig Dispense Refill  . amiodarone (PACERONE) 200 MG tablet Take 1 tablet (200 mg total) by mouth daily. Take one daily 90 tablet 0  . aspirin EC 325 MG EC tablet Take 1 tablet (325 mg total) by mouth daily. 30 tablet 0  . lisinopril-hydrochlorothiazide (PRINZIDE,ZESTORETIC) 20-25 MG tablet Take 1 tablet by mouth every evening.    . metoprolol succinate (TOPROL-XL) 50 MG 24 hr tablet Take 50 mg by mouth daily. Take with or immediately following a meal.    . omega-3 fish oil (MAXEPA) 1000 MG CAPS capsule Take 1 capsule (1,000 mg total) by mouth 2 (two) times daily. Reported on 11/14/2015 180 each 1  . potassium chloride SA (K-DUR,KLOR-CON) 20 MEQ tablet Take 20 mEq by mouth every evening.    . simvastatin (ZOCOR) 20 MG tablet Take 20 mg by mouth daily.     No current facility-administered medications for this visit.     No Known  Allergies  Review of Systems  Mild chest soreness intermittently with stretching No shortness of breath Weight has been stable No fever  BP 112/75 (BP Location: Left Arm, Patient Position: Sitting, Cuff Size: Large)   Pulse 74   Resp 16   Ht 5\' 5"  (1.651 m)   Wt 177 lb (80.3 kg)   SpO2 96% Comment: ON RA  BMI 29.45 kg/m  Physical Exam      Exam    General- alert and comfortable   Lungs- clear without rales, wheezes   Cor- regular rate and rhythm, no murmur , gallop   Abdomen- soft, non-tender   Extremities - warm, non-tender, minimal edema   Neuro- oriented, appropriate, no focal weakness   Diagnostic Tests: Last chest x-ray taken personally reviewed and is  clear   Impression: Doing well 6 weeks after CABG She can resume full activities without limitations after April 1  Plan: Stop amiodarone, stop potassium, continue Zocor and aspirin indefinitely Continue metoprolol and Zestril at current doses and follow directions of her cardiologist, primary care physician She understands the importance of a heart healthy diet  Return prn  Len Childs, MD [facility] [phone]

## 2016-09-04 ENCOUNTER — Encounter (HOSPITAL_COMMUNITY): Payer: Medicare Other

## 2016-09-11 ENCOUNTER — Encounter (HOSPITAL_COMMUNITY)
Admission: RE | Admit: 2016-09-11 | Discharge: 2016-09-11 | Disposition: A | Discharge status: Home / Self Care | Payer: Medicare Other | Source: Ambulatory Visit | Attending: Cardiovascular Disease | Admitting: Cardiovascular Disease

## 2016-09-11 ENCOUNTER — Telehealth: Payer: Self-pay | Admitting: Cardiovascular Disease

## 2016-09-11 ENCOUNTER — Encounter (HOSPITAL_COMMUNITY): Payer: Self-pay

## 2016-09-11 VITALS — BP 120/72 | HR 63 | Ht 64.0 in | Wt 181.0 lb

## 2016-09-11 DIAGNOSIS — I10 Essential (primary) hypertension: Secondary | ICD-10-CM | POA: Insufficient documentation

## 2016-09-11 DIAGNOSIS — Z87891 Personal history of nicotine dependence: Secondary | ICD-10-CM | POA: Insufficient documentation

## 2016-09-11 DIAGNOSIS — E785 Hyperlipidemia, unspecified: Secondary | ICD-10-CM | POA: Insufficient documentation

## 2016-09-11 DIAGNOSIS — Z79899 Other long term (current) drug therapy: Secondary | ICD-10-CM | POA: Insufficient documentation

## 2016-09-11 DIAGNOSIS — I2510 Atherosclerotic heart disease of native coronary artery without angina pectoris: Secondary | ICD-10-CM | POA: Insufficient documentation

## 2016-09-11 DIAGNOSIS — Z7982 Long term (current) use of aspirin: Secondary | ICD-10-CM | POA: Insufficient documentation

## 2016-09-11 DIAGNOSIS — Z951 Presence of aortocoronary bypass graft: Secondary | ICD-10-CM | POA: Diagnosis not present

## 2016-09-11 NOTE — Telephone Encounter (Signed)
[place] at [facility] will fax form for Dr. Johnsie Cancel to sign. Informed her that Dr. Johnsie Cancel is back in the office and he would be able to sign at that time. [place] agree to plan and will call with any other concerns.

## 2016-09-11 NOTE — Telephone Encounter (Signed)
New Message     They need a medicaid reimbursement form signed by Dr Johnsie Cancel and sent back to them today

## 2016-09-11 NOTE — Progress Notes (Signed)
Cardiac/Pulmonary Rehab Medication Review by a Pharmacist  Does the patient  feel that his/her medications are working for him/her?  yes  Has the patient been experiencing any side effects to the medications prescribed?  no  Does the patient measure his/her own blood pressure or blood glucose at home?  no   Does the patient have any problems obtaining medications due to transportation or finances?   no  Understanding of regimen: good Understanding of indications: good Potential of compliance: excellent  Questions asked to Determine Patient Understanding of Medication Regimen:  1. What is the name of the medication?  2. What is the medication used for?  3. When should it be taken?  4. How much should be taken?  5. How will you take it?  6. What side effects should you report?  Understanding Defined as: Excellent: All questions above are correct Good: Questions 1-4 are correct Fair: Questions 1-2 are correct  Poor: 1 or none of the above questions are correct   Pharmacist comments: Pt does not c/o any side effects from medications.  Pt states amiodarone was stopped by MD.  Pt does not check BP at home but says it is typically fine when checked at MD office.  Med list updated.    Hart Robinsons A 09/11/2016 11:48 AM

## 2016-09-11 NOTE — Progress Notes (Signed)
Cardiac Individual Treatment Plan  Patient Details  Name: Donna Woodward MRN: [id number] Date of Birth: Mar 13, 1956 Referring Provider:     CARDIAC REHAB PHASE II ORIENTATION from 09/11/2016 in [place]  Referring Provider  Dr. Johnsie Cancel      Initial Encounter Date:    CARDIAC REHAB PHASE II ORIENTATION from 09/11/2016 in [place]  Date  09/11/16  Referring Provider  Dr. Johnsie Cancel      Visit Diagnosis: S/P CABG x 3  Patient's Home Medications on Admission:  Current Outpatient Prescriptions:  .  aspirin EC 325 MG EC tablet, Take 1 tablet (325 mg total) by mouth daily., Disp: 30 tablet, Rfl: 0 .  lisinopril-hydrochlorothiazide (PRINZIDE,ZESTORETIC) 20-25 MG tablet, Take 1 tablet by mouth every evening., Disp: , Rfl:  .  metoprolol succinate (TOPROL-XL) 50 MG 24 hr tablet, Take 50 mg by mouth daily. Take with or immediately following a meal., Disp: , Rfl:  .  omega-3 fish oil (MAXEPA) 1000 MG CAPS capsule, Take 1 capsule (1,000 mg total) by mouth 2 (two) times daily. Reported on 11/14/2015, Disp: 180 each, Rfl: 1 .  simvastatin (ZOCOR) 20 MG tablet, Take 20 mg by mouth daily., Disp: , Rfl:   Past Medical History: Past Medical History:  Diagnosis Date  . Anxiety   . Back pain   . Coronary artery disease   . Degenerative joint disease (DJD) of lumbar spine 03/24/2016  . Depression   . GERD (gastroesophageal reflux disease)    if overweight  . Hyperlipidemia   . Hypertension     Tobacco Use: History  Smoking Status  . Former Smoker  . Quit date: 06/30/2006  Smokeless Tobacco  . Never Used    Labs: Recent Review Flowsheet Data    Labs for [facility]P Cardiac and Pulmonary Rehab Latest Ref Rng & Units 06/17/2016 06/17/2016 06/17/2016 06/18/2016 07/24/2016   Cholestrol <200 mg/dL - - - - 128   LDLCALC <100 mg/dL - - - - 67   HDL >50 mg/dL - - - - 23(L)   Trlycerides <150 mg/dL - - - - 189(H)   Hemoglobin A1c 4.8 - 5.6 % - - - - -   PHART  7.350 - 7.450 - 7.385 7.372 - -   PCO2ART 32.0 - 48.0 mmHg - 42.6 42.8 - -   HCO3 20.0 - 28.0 mmol/L - 25.8 25.0 - -   TCO2 0 - 100 mmol/L 26 27 26 28  -   O2SAT % - 95.0 97.0 - -      Capillary Blood Glucose: Lab Results  Component Value Date   GLUCAP 114 (H) 06/20/2016   GLUCAP 145 (H) 06/20/2016   GLUCAP 124 (H) 06/20/2016   GLUCAP 124 (H) 06/19/2016   GLUCAP 151 (H) 06/19/2016     Exercise Target Goals: Date: 09/11/16  Exercise Program Goal: Individual exercise prescription set with THRR, safety & activity barriers. Participant demonstrates ability to understand and report RPE using BORG scale, to self-measure pulse accurately, and to acknowledge the importance of the exercise prescription.  Exercise Prescription Goal: Starting with aerobic activity 30 plus minutes a day, 3 days per week for initial exercise prescription. Provide home exercise prescription and guidelines that participant acknowledges understanding prior to discharge.  Activity Barriers & Risk Stratification:     Activity Barriers & Cardiac Risk Stratification - 09/11/16 1311      Activity Barriers & Cardiac Risk Stratification   Activity Barriers Back Problems  Patient has chronic back pain. She  is s/p herinated disc repair in 1989. She is not able to sit or stand for long periods. She is not able to walk on treadmil.    Cardiac Risk Stratification High      6 Minute Walk:     6 Minute Walk    Row Name 09/11/16 1154         6 Minute Walk   Phase Initial     Distance 1000 feet     Distance % Change 0 %     Walk Time 6 minutes     # of Rest Breaks 0     MPH 1.89     METS 2.45     RPE 11     Perceived Dyspnea  11     VO2 Peak 9.05     Symptoms No     Resting HR 63 bpm     Resting BP 120/72     Max Ex. HR 75 bpm     Max Ex. BP 156/82     2 Minute Post BP 136/72        Oxygen Initial Assessment:   Oxygen Re-Evaluation:   Oxygen Discharge (Final Oxygen  Re-Evaluation):   Initial Exercise Prescription:     Initial Exercise Prescription - 09/11/16 1100      Date of Initial Exercise RX and Referring Provider   Date 09/11/16   Referring Provider Dr. Johnsie Cancel     NuStep   Level 2   SPM 15   Minutes 15   METs 1.9     Recumbant Elliptical   Level 1   RPM 30   Watts 25   Minutes 20   METs 1.5     Prescription Details   Frequency (times per week) 3   Duration Progress to 30 minutes of continuous aerobic without signs/symptoms of physical distress     Intensity   THRR 40-80% of Max Heartrate [phone]   Ratings of Perceived Exertion 11-13   Perceived Dyspnea 0-4     Progression   Progression Continue progressive overload as per policy without signs/symptoms or physical distress.     Resistance Training   Training Prescription Yes   Weight 1   Reps 10-15      Perform Capillary Blood Glucose checks as needed.  Exercise Prescription Changes:   Exercise Comments:   Exercise Goals and Review:      Exercise Goals    Row Name 09/11/16 1311             Exercise Goals   Increase Physical Activity Yes       Intervention Provide advice, education, support and counseling about physical activity/exercise needs.;Develop an individualized exercise prescription for aerobic and resistive training based on initial evaluation findings, risk stratification, comorbidities and participant's personal goals.       Expected Outcomes Achievement of increased cardiorespiratory fitness and enhanced flexibility, muscular endurance and strength shown through measurements of functional capacity and personal statement of participant.       Increase Strength and Stamina Yes       Intervention Provide advice, education, support and counseling about physical activity/exercise needs.;Develop an individualized exercise prescription for aerobic and resistive training based on initial evaluation findings, risk stratification, comorbidities and  participant's personal goals.       Expected Outcomes Achievement of increased cardiorespiratory fitness and enhanced flexibility, muscular endurance and strength shown through measurements of functional capacity and personal statement of participant.  Exercise Goals Re-Evaluation :    Discharge Exercise Prescription (Final Exercise Prescription Changes):   Nutrition:  Target Goals: Understanding of nutrition guidelines, daily intake of sodium 1500mg , cholesterol 200mg , calories 30% from fat and 7% or less from saturated fats, daily to have 5 or more servings of fruits and vegetables.  Biometrics:     Pre Biometrics - 09/11/16 1157      Pre Biometrics   Height 5\' 4"  (1.626 m)   Waist Circumference 35 inches   Hip Circumference 42.5 inches   Waist to Hip Ratio 0.82 %   Triceps Skinfold 17 mm   % Body Fat 37.4 %   Grip Strength 68.33 kg   Flexibility 0 in  Lumbar back pain.   Single Leg Stand 13 seconds       Nutrition Therapy Plan and Nutrition Goals:   Nutrition Discharge: Rate Your Plate Scores:   Nutrition Goals Re-Evaluation:   Nutrition Goals Discharge (Final Nutrition Goals Re-Evaluation):   Psychosocial: Target Goals: Acknowledge presence or absence of significant depression and/or stress, maximize coping skills, provide positive support system. Participant is able to verbalize types and ability to use techniques and skills needed for reducing stress and depression.  Initial Review & Psychosocial Screening:     Initial Psych Review & Screening - 09/11/16 1306      Initial Review   Current issues with Current Depression;Current Sleep Concerns     Family Dynamics   Good Support System? Yes     Barriers   Psychosocial barriers to participate in program The patient should benefit from training in stress management and relaxation.;Psychosocial barriers identified (see note)  PHQ-9 score was 4 and her QOL score was 24.06. She says she has some  depression d/t low self esteem. She has been to counseling at [facility] but says it did not help. She is not interested in any treatment.      Screening Interventions   Interventions Encouraged to exercise      Quality of Life Scores:     Quality of Life - 09/11/16 1158      Quality of Life Scores   Health/Function Pre 24.3 %   Socioeconomic Pre 25.07 %   Psych/Spiritual Pre 25.36 %   Family Pre 17.5 %   GLOBAL Pre 24.06 %      PHQ-9: Recent Review Flowsheet Data    Depression screen [facility] 2/9 09/11/2016 07/24/2016 04/23/2016   Decreased Interest 0 0 3   Down, Depressed, Hopeless 1 0 2   PHQ - 2 Score 1 0 5   Altered sleeping 1 - 3   Tired, decreased energy 0 - 0   Change in appetite 1 - 0   Feeling bad or failure about yourself  1 - 0   Trouble concentrating 0 - 0   Moving slowly or fidgety/restless 0 - 0   Suicidal thoughts - - 0   PHQ-9 Score 4 - 8   Difficult doing work/chores Not difficult at all - -     Interpretation of Total Score  Total Score Depression Severity:  1-4 = Minimal depression, 5-9 = Mild depression, 10-14 = Moderate depression, 15-19 = Moderately severe depression, 20-27 = Severe depression   Psychosocial Evaluation and Intervention:     Psychosocial Evaluation - 09/11/16 1308      Psychosocial Evaluation & Interventions   Interventions Stress management education;Relaxation education;Encouraged to exercise with the program and follow exercise prescription   Comments Patient says she has some depression related to  low self esteem. She has been to conseling in the past but said it did not help. She is not interested in any treatment at orientation.   Expected Outcomes Patient will show improved PHQ-9 and QOL scores at discharge.    Continue Psychosocial Services  Follow up required by staff      Psychosocial Re-Evaluation:   Psychosocial Discharge (Final Psychosocial Re-Evaluation):   Vocational Rehabilitation: Provide vocational rehab  assistance to qualifying candidates.   Vocational Rehab Evaluation & Intervention:     Vocational Rehab - 09/11/16 1301      Initial Vocational Rehab Evaluation & Intervention   Assessment shows need for Vocational Rehabilitation No      Education: Education Goals: Education classes will be provided on a weekly basis, covering required topics. Participant will state understanding/return demonstration of topics presented.  Learning Barriers/Preferences:     Learning Barriers/Preferences - 09/11/16 1301      Learning Barriers/Preferences   Learning Barriers None   Learning Preferences Skilled Demonstration;Verbal Instruction;Written Material;Audio      Education Topics: Hypertension, Hypertension Reduction -Define heart disease and high blood pressure. Discus how high blood pressure affects the body and ways to reduce high blood pressure.   Exercise and Your Heart -Discuss why it is important to exercise, the FITT principles of exercise, normal and abnormal responses to exercise, and how to exercise safely.   Angina -Discuss definition of angina, causes of angina, treatment of angina, and how to decrease risk of having angina.   Cardiac Medications -Review what the following cardiac medications are used for, how they affect the body, and side effects that may occur when taking the medications.  Medications include Aspirin, Beta blockers, calcium channel blockers, ACE Inhibitors, angiotensin receptor blockers, diuretics, digoxin, and antihyperlipidemics.   Congestive Heart Failure -Discuss the definition of CHF, how to live with CHF, the signs and symptoms of CHF, and how keep track of weight and sodium intake.   Heart Disease and Intimacy -Discus the effect sexual activity has on the heart, how changes occur during intimacy as we age, and safety during sexual activity.   Smoking Cessation / COPD -Discuss different methods to quit smoking, the health benefits of  quitting smoking, and the definition of COPD.   Nutrition I: Fats -Discuss the types of cholesterol, what cholesterol does to the heart, and how cholesterol levels can be controlled.   Nutrition II: Labels -Discuss the different components of food labels and how to read food label   Heart Parts and Heart Disease -Discuss the anatomy of the heart, the pathway of blood circulation through the heart, and these are affected by heart disease.   Stress I: Signs and Symptoms -Discuss the causes of stress, how stress may lead to anxiety and depression, and ways to limit stress.   Stress II: Relaxation -Discuss different types of relaxation techniques to limit stress.   Warning Signs of Stroke / TIA -Discuss definition of a stroke, what the signs and symptoms are of a stroke, and how to identify when someone is having stroke.   Knowledge Questionnaire Score:     Knowledge Questionnaire Score - 09/11/16 1301      Knowledge Questionnaire Score   Pre Score 20/24      Core Components/Risk Factors/Patient Goals at Admission:     Personal Goals and Risk Factors at Admission - 09/11/16 1304      Core Components/Risk Factors/Patient Goals on Admission    Weight Management Obesity   Hypertension Yes   Intervention  Provide education on lifestyle modifcations including regular physical activity/exercise, weight management, moderate sodium restriction and increased consumption of fresh fruit, vegetables, and low fat dairy, alcohol moderation, and smoking cessation.;Monitor prescription use compliance.   Expected Outcomes Short Term: Continued assessment and intervention until BP is < 140/27mm HG in hypertensive participants. < 130/50mm HG in hypertensive participants with diabetes, heart failure or chronic kidney disease.;Long Term: Maintenance of blood pressure at goal levels.   Personal Goal Other Yes   Personal Goal Gain strength and stamina; be able to walk dog further.     Intervention Patient will attend cardiac rehab 3 days/week and supplement exercise 2 days/wk at home.    Expected Outcomes Patient will meet her personal goals.       Core Components/Risk Factors/Patient Goals Review:      Goals and Risk Factor Review    Row Name 09/11/16 1306             Core Components/Risk Factors/Patient Goals Review   Personal Goals Review Weight Management/Obesity          Core Components/Risk Factors/Patient Goals at Discharge (Final Review):      Goals and Risk Factor Review - 09/11/16 1306      Core Components/Risk Factors/Patient Goals Review   Personal Goals Review Weight Management/Obesity      ITP Comments:   Comments: Patient arrived for 1st visit/orientation/education at 1000. Patient was referred to CR by Jenkins Rouge, MD due to CABG x 3 (Z95.1). During orientation advised patient on arrival and appointment times what to wear, what to do before, during and after exercise. Reviewed attendance and class policy. Talked about inclement weather and class consultation policy. Pt is scheduled to return Cardiac Rehab on 09/22/16 at 1100. Pt was advised to come to class 15 minutes before class starts. Patient was also given instructions on meeting with the dietician and attending the Family Structure classes. Pt is eager to get started. Patient participated in warm up stretches followed by light weights and resistance bands. Patient was able to complete 6 minute walk test. She has chronic constant lower back pain 5/10 at rest 6/10 during walk test. She c/o chest wall pain from incision 3.5/10 during walk test. Pain subsided after rest. Patient was measured for the equipment. Discussed equipment safety with patient. Took patient pre-anthropometric measurements. Patient finished visit at 1200.

## 2016-09-22 ENCOUNTER — Encounter (HOSPITAL_COMMUNITY): Payer: Medicare Other

## 2016-09-22 ENCOUNTER — Encounter (HOSPITAL_COMMUNITY): Payer: Self-pay

## 2016-09-24 ENCOUNTER — Encounter (HOSPITAL_COMMUNITY): Payer: Medicare Other

## 2016-09-26 ENCOUNTER — Encounter (HOSPITAL_COMMUNITY): Payer: Medicare Other

## 2016-09-29 ENCOUNTER — Encounter (HOSPITAL_COMMUNITY)
Admission: RE | Admit: 2016-09-29 | Discharge: 2016-09-29 | Disposition: A | Discharge status: Home / Self Care | Payer: Medicare Other | Source: Ambulatory Visit | Attending: Cardiovascular Disease | Admitting: Cardiovascular Disease

## 2016-09-29 DIAGNOSIS — Z7982 Long term (current) use of aspirin: Secondary | ICD-10-CM | POA: Insufficient documentation

## 2016-09-29 DIAGNOSIS — I2510 Atherosclerotic heart disease of native coronary artery without angina pectoris: Secondary | ICD-10-CM | POA: Diagnosis not present

## 2016-09-29 DIAGNOSIS — E785 Hyperlipidemia, unspecified: Secondary | ICD-10-CM | POA: Diagnosis not present

## 2016-09-29 DIAGNOSIS — Z951 Presence of aortocoronary bypass graft: Secondary | ICD-10-CM | POA: Diagnosis not present

## 2016-09-29 DIAGNOSIS — I10 Essential (primary) hypertension: Secondary | ICD-10-CM | POA: Diagnosis not present

## 2016-09-29 DIAGNOSIS — Z79899 Other long term (current) drug therapy: Secondary | ICD-10-CM | POA: Diagnosis not present

## 2016-09-29 DIAGNOSIS — Z87891 Personal history of nicotine dependence: Secondary | ICD-10-CM | POA: Diagnosis not present

## 2016-09-29 NOTE — Progress Notes (Signed)
Daily Session Note  Patient Details  Name: [name] MRN: [id number] Date of Birth: Sep 23, 1955 Referring Provider:     CARDIAC REHAB PHASE II ORIENTATION from 09/11/2016 in [place]  Referring Provider  Dr. Johnsie Cancel      Encounter Date: 09/29/2016  Check In:     Session Check In - 09/29/16 1120      Check-In   Location [facility]   Staff Present Aundra Dubin, RN, BSN;Rayne Cowdrey Luther Parody, BS, EP, Exercise Physiologist   Supervising physician immediately available to respond to emergencies See telemetry face sheet for immediately available MD   Medication changes reported     No   Fall or balance concerns reported    No   Warm-up and Cool-down Performed as group-led instruction   Resistance Training Performed Yes   VAD Patient? No     Pain Assessment   Currently in Pain? No/denies   Pain Score 0-No pain   Multiple Pain Sites No      Capillary Blood Glucose: No results found for this or any previous visit (from the past 24 hour(s)).    History  Smoking Status  . Former Smoker  . Quit date: 06/30/2006  Smokeless Tobacco  . Never Used    Goals Met:  Independence with exercise equipment Exercise tolerated well No report of cardiac concerns or symptoms Strength training completed today  Goals Unmet:  Not Applicable  Comments: Check out 1200   Dr. Kate Sable is Medical Director for [place] and Pulmonary Rehab.

## 2016-10-01 ENCOUNTER — Encounter (HOSPITAL_COMMUNITY)
Admission: RE | Admit: 2016-10-01 | Discharge: 2016-10-01 | Disposition: A | Discharge status: Home / Self Care | Payer: Medicare Other | Source: Ambulatory Visit | Attending: Cardiovascular Disease | Admitting: Cardiovascular Disease

## 2016-10-01 NOTE — Progress Notes (Signed)
Cardiac Individual Treatment Plan  Patient Details  Name: Donna Woodward MRN: [id number] Date of Birth: 01-29-1956 Referring Provider:     CARDIAC REHAB PHASE II ORIENTATION from 09/11/2016 in [place]  Referring Provider  Dr. Johnsie Cancel      Initial Encounter Date:    CARDIAC REHAB PHASE II ORIENTATION from 09/11/2016 in [place]  Date  09/11/16  Referring Provider  Dr. Johnsie Cancel      Visit Diagnosis: S/P CABG x 3  Patient's Home Medications on Admission:  Current Outpatient Prescriptions:  .  aspirin EC 325 MG EC tablet, Take 1 tablet (325 mg total) by mouth daily., Disp: 30 tablet, Rfl: 0 .  lisinopril-hydrochlorothiazide (PRINZIDE,ZESTORETIC) 20-25 MG tablet, Take 1 tablet by mouth every evening., Disp: , Rfl:  .  metoprolol succinate (TOPROL-XL) 50 MG 24 hr tablet, Take 50 mg by mouth daily. Take with or immediately following a meal., Disp: , Rfl:  .  omega-3 fish oil (MAXEPA) 1000 MG CAPS capsule, Take 1 capsule (1,000 mg total) by mouth 2 (two) times daily. Reported on 11/14/2015, Disp: 180 each, Rfl: 1 .  simvastatin (ZOCOR) 20 MG tablet, Take 20 mg by mouth daily., Disp: , Rfl:   Past Medical History: Past Medical History:  Diagnosis Date  . Anxiety   . Back pain   . Coronary artery disease   . Degenerative joint disease (DJD) of lumbar spine 03/24/2016  . Depression   . GERD (gastroesophageal reflux disease)    if overweight  . Hyperlipidemia   . Hypertension     Tobacco Use: History  Smoking Status  . Former Smoker  . Quit date: 06/30/2006  Smokeless Tobacco  . Never Used    Labs: Recent Review Flowsheet Data    Labs for ITP Cardiac and Pulmonary Rehab Latest Ref Rng & Units 06/17/2016 06/17/2016 06/17/2016 06/18/2016 07/24/2016   Cholestrol <200 mg/dL - - - - 128   LDLCALC <100 mg/dL - - - - 67   HDL >50 mg/dL - - - - 23(L)   Trlycerides <150 mg/dL - - - - 189(H)   Hemoglobin A1c 4.8 - 5.6 % - - - - -   PHART  7.350 - 7.450 - 7.385 7.372 - -   PCO2ART 32.0 - 48.0 mmHg - 42.6 42.8 - -   HCO3 20.0 - 28.0 mmol/L - 25.8 25.0 - -   TCO2 0 - 100 mmol/L 26 27 26 28  -   O2SAT % - 95.0 97.0 - -      Capillary Blood Glucose: Lab Results  Component Value Date   GLUCAP 114 (H) 06/20/2016   GLUCAP 145 (H) 06/20/2016   GLUCAP 124 (H) 06/20/2016   GLUCAP 124 (H) 06/19/2016   GLUCAP 151 (H) 06/19/2016     Exercise Target Goals:    Exercise Program Goal: Individual exercise prescription set with THRR, safety & activity barriers. Participant demonstrates ability to understand and report RPE using BORG scale, to self-measure pulse accurately, and to acknowledge the importance of the exercise prescription.  Exercise Prescription Goal: Starting with aerobic activity 30 plus minutes a day, 3 days per week for initial exercise prescription. Provide home exercise prescription and guidelines that participant acknowledges understanding prior to discharge.  Activity Barriers & Risk Stratification:     Activity Barriers & Cardiac Risk Stratification - 09/11/16 1311      Activity Barriers & Cardiac Risk Stratification   Activity Barriers Back Problems  Patient has chronic back pain. She  is s/p herinated disc repair in 1989. She is not able to sit or stand for long periods. She is not able to walk on treadmil.    Cardiac Risk Stratification High      6 Minute Walk:     6 Minute Walk    Row Name 09/11/16 1154         6 Minute Walk   Phase Initial     Distance 1000 feet     Distance % Change 0 %     Walk Time 6 minutes     # of Rest Breaks 0     MPH 1.89     METS 2.45     RPE 11     Perceived Dyspnea  11     VO2 Peak 9.05     Symptoms No     Resting HR 63 bpm     Resting BP 120/72     Max Ex. HR 75 bpm     Max Ex. BP 156/82     2 Minute Post BP 136/72        Oxygen Initial Assessment:   Oxygen Re-Evaluation:   Oxygen Discharge (Final Oxygen Re-Evaluation):   Initial Exercise  Prescription:     Initial Exercise Prescription - 09/11/16 1100      Date of Initial Exercise RX and Referring Provider   Date 09/11/16   Referring Provider Dr. Johnsie Cancel     NuStep   Level 2   SPM 15   Minutes 15   METs 1.9     Recumbant Elliptical   Level 1   RPM 30   Watts 25   Minutes 20   METs 1.5     Prescription Details   Frequency (times per week) 3   Duration Progress to 30 minutes of continuous aerobic without signs/symptoms of physical distress     Intensity   THRR 40-80% of Max Heartrate [phone]   Ratings of Perceived Exertion 11-13   Perceived Dyspnea 0-4     Progression   Progression Continue progressive overload as per policy without signs/symptoms or physical distress.     Resistance Training   Training Prescription Yes   Weight 1   Reps 10-15      Perform Capillary Blood Glucose checks as needed.  Exercise Prescription Changes:      Exercise Prescription Changes    Row Name 09/30/16 1200             Response to Exercise   Blood Pressure (Admit) 124/72       Blood Pressure (Exercise) 128/72       Blood Pressure (Exit) 124/72       Heart Rate (Admit) 59 bpm       Heart Rate (Exercise) 70 bpm       Heart Rate (Exit) 66 bpm       Rating of Perceived Exertion (Exercise) 10       Duration Progress to 30 minutes of  aerobic without signs/symptoms of physical distress       Intensity THRR unchanged         Progression   Progression Continue to progress workloads to maintain intensity without signs/symptoms of physical distress.         Resistance Training   Training Prescription Yes       Weight 1       Reps 10-15         NuStep   Level 2  SPM 13       Minutes 15       METs 3.51         Recumbant Elliptical   Level 1       RPM 40       Watts 43       Minutes 20       METs 2.3         Home Exercise Plan   Plans to continue exercise at Home (comment)       Frequency Add 2 additional days to program exercise sessions.           Exercise Comments:      Exercise Comments    Row Name 09/30/16 1251           Exercise Comments Patient is doing well in CR. She has just recently started.           Exercise Goals and Review:      Exercise Goals    Row Name 09/11/16 1311             Exercise Goals   Increase Physical Activity Yes       Intervention Provide advice, education, support and counseling about physical activity/exercise needs.;Develop an individualized exercise prescription for aerobic and resistive training based on initial evaluation findings, risk stratification, comorbidities and participant's personal goals.       Expected Outcomes Achievement of increased cardiorespiratory fitness and enhanced flexibility, muscular endurance and strength shown through measurements of functional capacity and personal statement of participant.       Increase Strength and Stamina Yes       Intervention Provide advice, education, support and counseling about physical activity/exercise needs.;Develop an individualized exercise prescription for aerobic and resistive training based on initial evaluation findings, risk stratification, comorbidities and participant's personal goals.       Expected Outcomes Achievement of increased cardiorespiratory fitness and enhanced flexibility, muscular endurance and strength shown through measurements of functional capacity and personal statement of participant.          Exercise Goals Re-Evaluation :    Discharge Exercise Prescription (Final Exercise Prescription Changes):     Exercise Prescription Changes - 09/30/16 1200      Response to Exercise   Blood Pressure (Admit) 124/72   Blood Pressure (Exercise) 128/72   Blood Pressure (Exit) 124/72   Heart Rate (Admit) 59 bpm   Heart Rate (Exercise) 70 bpm   Heart Rate (Exit) 66 bpm   Rating of Perceived Exertion (Exercise) 10   Duration Progress to 30 minutes of  aerobic without signs/symptoms of physical  distress   Intensity THRR unchanged     Progression   Progression Continue to progress workloads to maintain intensity without signs/symptoms of physical distress.     Resistance Training   Training Prescription Yes   Weight 1   Reps 10-15     NuStep   Level 2   SPM 13   Minutes 15   METs 3.51     Recumbant Elliptical   Level 1   RPM 40   Watts 43   Minutes 20   METs 2.3     Home Exercise Plan   Plans to continue exercise at Home (comment)   Frequency Add 2 additional days to program exercise sessions.      Nutrition:  Target Goals: Understanding of nutrition guidelines, daily intake of sodium 1500mg , cholesterol 200mg , calories 30% from fat and 7% or less from saturated fats, daily to  have 5 or more servings of fruits and vegetables.  Biometrics:     Pre Biometrics - 09/11/16 1157      Pre Biometrics   Height 5\' 4"  (1.626 m)   Waist Circumference 35 inches   Hip Circumference 42.5 inches   Waist to Hip Ratio 0.82 %   Triceps Skinfold 17 mm   % Body Fat 37.4 %   Grip Strength 68.33 kg   Flexibility 0 in  Lumbar back pain.   Single Leg Stand 13 seconds       Nutrition Therapy Plan and Nutrition Goals:   Nutrition Discharge: Rate Your Plate Scores:   Nutrition Goals Re-Evaluation:   Nutrition Goals Discharge (Final Nutrition Goals Re-Evaluation):   Psychosocial: Target Goals: Acknowledge presence or absence of significant depression and/or stress, maximize coping skills, provide positive support system. Participant is able to verbalize types and ability to use techniques and skills needed for reducing stress and depression.  Initial Review & Psychosocial Screening:     Initial Psych Review & Screening - 09/11/16 1306      Initial Review   Current issues with Current Depression;Current Sleep Concerns     Family Dynamics   Good Support System? Yes     Barriers   Psychosocial barriers to participate in program The patient should benefit  from training in stress management and relaxation.;Psychosocial barriers identified (see note)  PHQ-9 score was 4 and her QOL score was 24.06. She says she has some depression d/t low self esteem. She has been to counseling at [facility] but says it did not help. She is not interested in any treatment.      Screening Interventions   Interventions Encouraged to exercise      Quality of Life Scores:     Quality of Life - 09/11/16 1158      Quality of Life Scores   Health/Function Pre 24.3 %   Socioeconomic Pre 25.07 %   Psych/Spiritual Pre 25.36 %   Family Pre 17.5 %   GLOBAL Pre 24.06 %      PHQ-9: Recent Review Flowsheet Data    Depression screen [facility] 2/9 09/11/2016 07/24/2016 04/23/2016   Decreased Interest 0 0 3   Down, Depressed, Hopeless 1 0 2   PHQ - 2 Score 1 0 5   Altered sleeping 1 - 3   Tired, decreased energy 0 - 0   Change in appetite 1 - 0   Feeling bad or failure about yourself  1 - 0   Trouble concentrating 0 - 0   Moving slowly or fidgety/restless 0 - 0   Suicidal thoughts - - 0   PHQ-9 Score 4 - 8   Difficult doing work/chores Not difficult at all - -     Interpretation of Total Score  Total Score Depression Severity:  1-4 = Minimal depression, 5-9 = Mild depression, 10-14 = Moderate depression, 15-19 = Moderately severe depression, 20-27 = Severe depression   Psychosocial Evaluation and Intervention:     Psychosocial Evaluation - 09/11/16 1308      Psychosocial Evaluation & Interventions   Interventions Stress management education;Relaxation education;Encouraged to exercise with the program and follow exercise prescription   Comments Patient says she has some depression related to low self esteem. She has been to [facility] in the past but said it did not help. She is not interested in any treatment at orientation.   Expected Outcomes Patient will show improved PHQ-9 and QOL scores at discharge.    Continue  Psychosocial Services  Follow up required by  staff      Psychosocial Re-Evaluation:   Psychosocial Discharge (Final Psychosocial Re-Evaluation):   Vocational Rehabilitation: Provide vocational rehab assistance to qualifying candidates.   Vocational Rehab Evaluation & Intervention:     Vocational Rehab - 09/11/16 1301      Initial Vocational Rehab Evaluation & Intervention   Assessment shows need for Vocational Rehabilitation No      Education: Education Goals: Education classes will be provided on a weekly basis, covering required topics. Participant will state understanding/return demonstration of topics presented.  Learning Barriers/Preferences:     Learning Barriers/Preferences - 09/11/16 1301      Learning Barriers/Preferences   Learning Barriers None   Learning Preferences Skilled Demonstration;Verbal Instruction;Written Material;Audio      Education Topics: Hypertension, Hypertension Reduction -Define heart disease and high blood pressure. Discus how high blood pressure affects the body and ways to reduce high blood pressure.   Exercise and Your Heart -Discuss why it is important to exercise, the FITT principles of exercise, normal and abnormal responses to exercise, and how to exercise safely.   Angina -Discuss definition of angina, causes of angina, treatment of angina, and how to decrease risk of having angina.   Cardiac Medications -Review what the following cardiac medications are used for, how they affect the body, and side effects that may occur when taking the medications.  Medications include Aspirin, Beta blockers, calcium channel blockers, ACE Inhibitors, angiotensin receptor blockers, diuretics, digoxin, and antihyperlipidemics.   Congestive Heart Failure -Discuss the definition of CHF, how to live with CHF, the signs and symptoms of CHF, and how keep track of weight and sodium intake.   Heart Disease and Intimacy -Discus the effect sexual activity has on the heart, how changes occur  during intimacy as we age, and safety during sexual activity.   Smoking Cessation / COPD -Discuss different methods to quit smoking, the health benefits of quitting smoking, and the definition of COPD.   Nutrition I: Fats -Discuss the types of cholesterol, what cholesterol does to the heart, and how cholesterol levels can be controlled.   Nutrition II: Labels -Discuss the different components of food labels and how to read food label   Heart Parts and Heart Disease -Discuss the anatomy of the heart, the pathway of blood circulation through the heart, and these are affected by heart disease.   Stress I: Signs and Symptoms -Discuss the causes of stress, how stress may lead to anxiety and depression, and ways to limit stress.   Stress II: Relaxation -Discuss different types of relaxation techniques to limit stress.   Warning Signs of Stroke / TIA -Discuss definition of a stroke, what the signs and symptoms are of a stroke, and how to identify when someone is having stroke.   Knowledge Questionnaire Score:     Knowledge Questionnaire Score - 09/11/16 1301      Knowledge Questionnaire Score   Pre Score 20/24      Core Components/Risk Factors/Patient Goals at Admission:     Personal Goals and Risk Factors at Admission - 09/11/16 1304      Core Components/Risk Factors/Patient Goals on Admission    Weight Management Obesity   Hypertension Yes   Intervention Provide education on lifestyle modifcations including regular physical activity/exercise, weight management, moderate sodium restriction and increased consumption of fresh fruit, vegetables, and low fat dairy, alcohol moderation, and smoking cessation.;Monitor prescription use compliance.   Expected Outcomes Short Term: Continued assessment and intervention until  BP is < 140/78mm HG in hypertensive participants. < 130/65mm HG in hypertensive participants with diabetes, heart failure or chronic kidney disease.;Long Term:  Maintenance of blood pressure at goal levels.   Personal Goal Other Yes   Personal Goal Gain strength and stamina; be able to walk dog further.    Intervention Patient will attend cardiac rehab 3 days/week and supplement exercise 2 days/wk at home.    Expected Outcomes Patient will meet her personal goals.       Core Components/Risk Factors/Patient Goals Review:      Goals and Risk Factor Review    Row Name 09/11/16 1306             Core Components/Risk Factors/Patient Goals Review   Personal Goals Review Weight Management/Obesity          Core Components/Risk Factors/Patient Goals at Discharge (Final Review):      Goals and Risk Factor Review - 09/11/16 1306      Core Components/Risk Factors/Patient Goals Review   Personal Goals Review Weight Management/Obesity      ITP Comments:     ITP Comments    Row Name 10/01/16 1503           ITP Comments Patient new to program completing 2 sessions. Will continue to monitor.           Comments: ITP 30 Day REVIEW Patient new to program completing 2 sessions. Will continue to monitor.

## 2016-10-03 ENCOUNTER — Encounter (HOSPITAL_COMMUNITY): Admission: RE | Admit: 2016-10-03 | Payer: Medicare Other | Source: Ambulatory Visit

## 2016-10-06 ENCOUNTER — Encounter (HOSPITAL_COMMUNITY): Payer: Medicare Other

## 2016-10-08 ENCOUNTER — Encounter (HOSPITAL_COMMUNITY): Payer: Medicare Other

## 2016-10-08 NOTE — Progress Notes (Signed)
Cardiac Individual Treatment Plan  Patient Details  Name: Donna Woodward MRN: [id number] Date of Birth: [birth year] Referring Provider:     CARDIAC REHAB PHASE II ORIENTATION from 09/11/2016 in [place]  Referring Provider  Dr. Johnsie Cancel      Initial Encounter Date:    CARDIAC REHAB PHASE II ORIENTATION from 09/11/2016 in [place]  Date  09/11/16  Referring Provider  Dr. Johnsie Cancel      Visit Diagnosis: S/P CABG x 3  Patient's Home Medications on Admission:  Current Outpatient Prescriptions:  .  aspirin EC 325 MG EC tablet, Take 1 tablet (325 mg total) by mouth daily., Disp: 30 tablet, Rfl: 0 .  lisinopril-hydrochlorothiazide (PRINZIDE,ZESTORETIC) 20-25 MG tablet, Take 1 tablet by mouth every evening., Disp: , Rfl:  .  metoprolol succinate (TOPROL-XL) 50 MG 24 hr tablet, Take 50 mg by mouth daily. Take with or immediately following a meal., Disp: , Rfl:  .  omega-3 fish oil (MAXEPA) 1000 MG CAPS capsule, Take 1 capsule (1,000 mg total) by mouth 2 (two) times daily. Reported on 11/14/2015, Disp: 180 each, Rfl: 1 .  simvastatin (ZOCOR) 20 MG tablet, Take 20 mg by mouth daily., Disp: , Rfl:   Past Medical History: Past Medical History:  Diagnosis Date  . Anxiety   . Back pain   . Coronary artery disease   . Degenerative joint disease (DJD) of lumbar spine 03/24/2016  . Depression   . GERD (gastroesophageal reflux disease)    if overweight  . Hyperlipidemia   . Hypertension     Tobacco Use: History  Smoking Status  . Former Smoker  . Quit date: 06/30/2006  Smokeless Tobacco  . Never Used    Labs: Recent Review Flowsheet Data    Labs for ITP Cardiac and Pulmonary Rehab Latest Ref Rng & Units 06/17/2016 06/17/2016 06/17/2016 06/18/2016 07/24/2016   Cholestrol <200 mg/dL - - - - 128   LDLCALC <100 mg/dL - - - - 67   HDL >50 mg/dL - - - - 23(L)   Trlycerides <150 mg/dL - - - - 189(H)   Hemoglobin A1c 4.8 - 5.6 % - - - - -   PHART  7.350 - 7.450 - 7.385 7.372 - -   PCO2ART 32.0 - 48.0 mmHg - 42.6 42.8 - -   HCO3 20.0 - 28.0 mmol/L - 25.8 25.0 - -   TCO2 0 - 100 mmol/L 26 27 26 28  -   O2SAT % - 95.0 97.0 - -      Capillary Blood Glucose: Lab Results  Component Value Date   GLUCAP 114 (H) 06/20/2016   GLUCAP 145 (H) 06/20/2016   GLUCAP 124 (H) 06/20/2016   GLUCAP 124 (H) 06/19/2016   GLUCAP 151 (H) 06/19/2016     Exercise Target Goals:    Exercise Program Goal: Individual exercise prescription set with THRR, safety & activity barriers. Participant demonstrates ability to understand and report RPE using BORG scale, to self-measure pulse accurately, and to acknowledge the importance of the exercise prescription.  Exercise Prescription Goal: Starting with aerobic activity 30 plus minutes a day, 3 days per week for initial exercise prescription. Provide home exercise prescription and guidelines that participant acknowledges understanding prior to discharge.  Activity Barriers & Risk Stratification:     Activity Barriers & Cardiac Risk Stratification - 09/11/16 1311      Activity Barriers & Cardiac Risk Stratification   Activity Barriers Back Problems  Patient has chronic back pain. She  is s/p herinated disc repair in 1989. She is not able to sit or stand for long periods. She is not able to walk on treadmil.    Cardiac Risk Stratification High      6 Minute Walk:     6 Minute Walk    Row Name 09/11/16 1154         6 Minute Walk   Phase Initial     Distance 1000 feet     Distance % Change 0 %     Walk Time 6 minutes     # of Rest Breaks 0     MPH 1.89     METS 2.45     RPE 11     Perceived Dyspnea  11     VO2 Peak 9.05     Symptoms No     Resting HR 63 bpm     Resting BP 120/72     Max Ex. HR 75 bpm     Max Ex. BP 156/82     2 Minute Post BP 136/72        Oxygen Initial Assessment:   Oxygen Re-Evaluation:   Oxygen Discharge (Final Oxygen Re-Evaluation):   Initial Exercise  Prescription:     Initial Exercise Prescription - 09/11/16 1100      Date of Initial Exercise RX and Referring Provider   Date 09/11/16   Referring Provider Dr. Johnsie Cancel     NuStep   Level 2   SPM 15   Minutes 15   METs 1.9     Recumbant Elliptical   Level 1   RPM 30   Watts 25   Minutes 20   METs 1.5     Prescription Details   Frequency (times per week) 3   Duration Progress to 30 minutes of continuous aerobic without signs/symptoms of physical distress     Intensity   THRR 40-80% of Max Heartrate [phone]   Ratings of Perceived Exertion 11-13   Perceived Dyspnea 0-4     Progression   Progression Continue progressive overload as per policy without signs/symptoms or physical distress.     Resistance Training   Training Prescription Yes   Weight 1   Reps 10-15      Perform Capillary Blood Glucose checks as needed.  Exercise Prescription Changes:      Exercise Prescription Changes    Row Name 09/30/16 1200             Response to Exercise   Blood Pressure (Admit) 124/72       Blood Pressure (Exercise) 128/72       Blood Pressure (Exit) 124/72       Heart Rate (Admit) 59 bpm       Heart Rate (Exercise) 70 bpm       Heart Rate (Exit) 66 bpm       Rating of Perceived Exertion (Exercise) 10       Duration Progress to 30 minutes of  aerobic without signs/symptoms of physical distress       Intensity THRR unchanged         Progression   Progression Continue to progress workloads to maintain intensity without signs/symptoms of physical distress.         Resistance Training   Training Prescription Yes       Weight 1       Reps 10-15         NuStep   Level 2  SPM 13       Minutes 15       METs 3.51         Recumbant Elliptical   Level 1       RPM 40       Watts 43       Minutes 20       METs 2.3         Home Exercise Plan   Plans to continue exercise at Home (comment)       Frequency Add 2 additional days to program exercise sessions.           Exercise Comments:      Exercise Comments    Row Name 09/30/16 1251           Exercise Comments Patient is doing well in CR. She has just recently started.           Exercise Goals and Review:      Exercise Goals    Row Name 09/11/16 1311             Exercise Goals   Increase Physical Activity Yes       Intervention Provide advice, education, support and counseling about physical activity/exercise needs.;Develop an individualized exercise prescription for aerobic and resistive training based on initial evaluation findings, risk stratification, comorbidities and participant's personal goals.       Expected Outcomes Achievement of increased cardiorespiratory fitness and enhanced flexibility, muscular endurance and strength shown through measurements of functional capacity and personal statement of participant.       Increase Strength and Stamina Yes       Intervention Provide advice, education, support and counseling about physical activity/exercise needs.;Develop an individualized exercise prescription for aerobic and resistive training based on initial evaluation findings, risk stratification, comorbidities and participant's personal goals.       Expected Outcomes Achievement of increased cardiorespiratory fitness and enhanced flexibility, muscular endurance and strength shown through measurements of functional capacity and personal statement of participant.          Exercise Goals Re-Evaluation :    Discharge Exercise Prescription (Final Exercise Prescription Changes):     Exercise Prescription Changes - 09/30/16 1200      Response to Exercise   Blood Pressure (Admit) 124/72   Blood Pressure (Exercise) 128/72   Blood Pressure (Exit) 124/72   Heart Rate (Admit) 59 bpm   Heart Rate (Exercise) 70 bpm   Heart Rate (Exit) 66 bpm   Rating of Perceived Exertion (Exercise) 10   Duration Progress to 30 minutes of  aerobic without signs/symptoms of physical  distress   Intensity THRR unchanged     Progression   Progression Continue to progress workloads to maintain intensity without signs/symptoms of physical distress.     Resistance Training   Training Prescription Yes   Weight 1   Reps 10-15     NuStep   Level 2   SPM 13   Minutes 15   METs 3.51     Recumbant Elliptical   Level 1   RPM 40   Watts 43   Minutes 20   METs 2.3     Home Exercise Plan   Plans to continue exercise at Home (comment)   Frequency Add 2 additional days to program exercise sessions.      Nutrition:  Target Goals: Understanding of nutrition guidelines, daily intake of sodium 1500mg , cholesterol 200mg , calories 30% from fat and 7% or less from saturated fats, daily to  have 5 or more servings of fruits and vegetables.  Biometrics:     Pre Biometrics - 09/11/16 1157      Pre Biometrics   Height 5\' 4"  (1.626 m)   Waist Circumference 35 inches   Hip Circumference 42.5 inches   Waist to Hip Ratio 0.82 %   Triceps Skinfold 17 mm   % Body Fat 37.4 %   Grip Strength 68.33 kg   Flexibility 0 in  Lumbar back pain.   Single Leg Stand 13 seconds       Nutrition Therapy Plan and Nutrition Goals:   Nutrition Discharge: Rate Your Plate Scores:   Nutrition Goals Re-Evaluation:   Nutrition Goals Discharge (Final Nutrition Goals Re-Evaluation):   Psychosocial: Target Goals: Acknowledge presence or absence of significant depression and/or stress, maximize coping skills, provide positive support system. Participant is able to verbalize types and ability to use techniques and skills needed for reducing stress and depression.  Initial Review & Psychosocial Screening:     Initial Psych Review & Screening - 09/11/16 1306      Initial Review   Current issues with Current Depression;Current Sleep Concerns     Family Dynamics   Good Support System? Yes     Barriers   Psychosocial barriers to participate in program The patient should benefit  from training in stress management and relaxation.;Psychosocial barriers identified (see note)  PHQ-9 score was 4 and her QOL score was 24.06. She says she has some depression d/t low self esteem. She has been to counseling at [facility] but says it did not help. She is not interested in any treatment.      Screening Interventions   Interventions Encouraged to exercise      Quality of Life Scores:     Quality of Life - 09/11/16 1158      Quality of Life Scores   Health/Function Pre 24.3 %   Socioeconomic Pre 25.07 %   Psych/Spiritual Pre 25.36 %   Family Pre 17.5 %   GLOBAL Pre 24.06 %      PHQ-9: Recent Review Flowsheet Data    Depression screen [facility] 2/9 09/11/2016 07/24/2016 04/23/2016   Decreased Interest 0 0 3   Down, Depressed, Hopeless 1 0 2   PHQ - 2 Score 1 0 5   Altered sleeping 1 - 3   Tired, decreased energy 0 - 0   Change in appetite 1 - 0   Feeling bad or failure about yourself  1 - 0   Trouble concentrating 0 - 0   Moving slowly or fidgety/restless 0 - 0   Suicidal thoughts - - 0   PHQ-9 Score 4 - 8   Difficult doing work/chores Not difficult at all - -     Interpretation of Total Score  Total Score Depression Severity:  1-4 = Minimal depression, 5-9 = Mild depression, 10-14 = Moderate depression, 15-19 = Moderately severe depression, 20-27 = Severe depression   Psychosocial Evaluation and Intervention:     Psychosocial Evaluation - 09/11/16 1308      Psychosocial Evaluation & Interventions   Interventions Stress management education;Relaxation education;Encouraged to exercise with the program and follow exercise prescription   Comments Patient says she has some depression related to low self esteem. She has been to [facility] in the past but said it did not help. She is not interested in any treatment at orientation.   Expected Outcomes Patient will show improved PHQ-9 and QOL scores at discharge.    Continue  Psychosocial Services  Follow up required by  staff      Psychosocial Re-Evaluation:   Psychosocial Discharge (Final Psychosocial Re-Evaluation):   Vocational Rehabilitation: Provide vocational rehab assistance to qualifying candidates.   Vocational Rehab Evaluation & Intervention:     Vocational Rehab - 09/11/16 1301      Initial Vocational Rehab Evaluation & Intervention   Assessment shows need for Vocational Rehabilitation No      Education: Education Goals: Education classes will be provided on a weekly basis, covering required topics. Participant will state understanding/return demonstration of topics presented.  Learning Barriers/Preferences:     Learning Barriers/Preferences - 09/11/16 1301      Learning Barriers/Preferences   Learning Barriers None   Learning Preferences Skilled Demonstration;Verbal Instruction;Written Material;Audio      Education Topics: Hypertension, Hypertension Reduction -Define heart disease and high blood pressure. Discus how high blood pressure affects the body and ways to reduce high blood pressure.   Exercise and Your Heart -Discuss why it is important to exercise, the FITT principles of exercise, normal and abnormal responses to exercise, and how to exercise safely.   Angina -Discuss definition of angina, causes of angina, treatment of angina, and how to decrease risk of having angina.   Cardiac Medications -Review what the following cardiac medications are used for, how they affect the body, and side effects that may occur when taking the medications.  Medications include Aspirin, Beta blockers, calcium channel blockers, ACE Inhibitors, angiotensin receptor blockers, diuretics, digoxin, and antihyperlipidemics.   Congestive Heart Failure -Discuss the definition of CHF, how to live with CHF, the signs and symptoms of CHF, and how keep track of weight and sodium intake.   Heart Disease and Intimacy -Discus the effect sexual activity has on the heart, how changes occur  during intimacy as we age, and safety during sexual activity.   Smoking Cessation / COPD -Discuss different methods to quit smoking, the health benefits of quitting smoking, and the definition of COPD.   Nutrition I: Fats -Discuss the types of cholesterol, what cholesterol does to the heart, and how cholesterol levels can be controlled.   Nutrition II: Labels -Discuss the different components of food labels and how to read food label   Heart Parts and Heart Disease -Discuss the anatomy of the heart, the pathway of blood circulation through the heart, and these are affected by heart disease.   Stress I: Signs and Symptoms -Discuss the causes of stress, how stress may lead to anxiety and depression, and ways to limit stress.   Stress II: Relaxation -Discuss different types of relaxation techniques to limit stress.   Warning Signs of Stroke / TIA -Discuss definition of a stroke, what the signs and symptoms are of a stroke, and how to identify when someone is having stroke.   Knowledge Questionnaire Score:     Knowledge Questionnaire Score - 09/11/16 1301      Knowledge Questionnaire Score   Pre Score 20/24      Core Components/Risk Factors/Patient Goals at Admission:     Personal Goals and Risk Factors at Admission - 09/11/16 1304      Core Components/Risk Factors/Patient Goals on Admission    Weight Management Obesity   Hypertension Yes   Intervention Provide education on lifestyle modifcations including regular physical activity/exercise, weight management, moderate sodium restriction and increased consumption of fresh fruit, vegetables, and low fat dairy, alcohol moderation, and smoking cessation.;Monitor prescription use compliance.   Expected Outcomes Short Term: Continued assessment and intervention until  BP is < 140/49mm HG in hypertensive participants. < 130/80mm HG in hypertensive participants with diabetes, heart failure or chronic kidney disease.;Long Term:  Maintenance of blood pressure at goal levels.   Personal Goal Other Yes   Personal Goal Gain strength and stamina; be able to walk dog further.    Intervention Patient will attend cardiac rehab 3 days/week and supplement exercise 2 days/wk at home.    Expected Outcomes Patient will meet her personal goals.       Core Components/Risk Factors/Patient Goals Review:      Goals and Risk Factor Review    Row Name 09/11/16 1306             Core Components/Risk Factors/Patient Goals Review   Personal Goals Review Weight Management/Obesity          Core Components/Risk Factors/Patient Goals at Discharge (Final Review):      Goals and Risk Factor Review - 09/11/16 1306      Core Components/Risk Factors/Patient Goals Review   Personal Goals Review Weight Management/Obesity      ITP Comments:     ITP Comments    Row Name 10/01/16 1503 10/08/16 1352         ITP Comments Patient new to program completing 2 sessions. Will continue to monitor.  Patient dropped out of program after 2 sessions.          Comments: Patient stopped coming to Cardiac Rehab on 09/29/16 after completing 2 sessions. Doctor will be informed.

## 2016-10-08 NOTE — Progress Notes (Signed)
Discharge Summary  Patient Details  Name: Donna Woodward MRN: [id number] Date of Birth: [birth year] Referring Provider:     CARDIAC REHAB PHASE II ORIENTATION from 09/11/2016 in [place]  Referring Provider  Dr. Johnsie Cancel       Number of Visits: 2  Reason for Discharge:  Early Exit:  Personal  Smoking History:  History  Smoking Status  . Former Smoker  . Quit date: 06/30/2006  Smokeless Tobacco  . Never Used    Diagnosis:  S/P CABG x 3  ADL UCSD:   Initial Exercise Prescription:     Initial Exercise Prescription - 09/11/16 1100      Date of Initial Exercise RX and Referring Provider   Date 09/11/16   Referring Provider Dr. Johnsie Cancel     NuStep   Level 2   SPM 15   Minutes 15   METs 1.9     Recumbant Elliptical   Level 1   RPM 30   Watts 25   Minutes 20   METs 1.5     Prescription Details   Frequency (times per week) 3   Duration Progress to 30 minutes of continuous aerobic without signs/symptoms of physical distress     Intensity   THRR 40-80% of Max Heartrate [phone]   Ratings of Perceived Exertion 11-13   Perceived Dyspnea 0-4     Progression   Progression Continue progressive overload as per policy without signs/symptoms or physical distress.     Resistance Training   Training Prescription Yes   Weight 1   Reps 10-15      Discharge Exercise Prescription (Final Exercise Prescription Changes):     Exercise Prescription Changes - 09/30/16 1200      Response to Exercise   Blood Pressure (Admit) 124/72   Blood Pressure (Exercise) 128/72   Blood Pressure (Exit) 124/72   Heart Rate (Admit) 59 bpm   Heart Rate (Exercise) 70 bpm   Heart Rate (Exit) 66 bpm   Rating of Perceived Exertion (Exercise) 10   Duration Progress to 30 minutes of  aerobic without signs/symptoms of physical distress   Intensity THRR unchanged     Progression   Progression Continue to progress workloads to maintain intensity without  signs/symptoms of physical distress.     Resistance Training   Training Prescription Yes   Weight 1   Reps 10-15     NuStep   Level 2   SPM 13   Minutes 15   METs 3.51     Recumbant Elliptical   Level 1   RPM 40   Watts 43   Minutes 20   METs 2.3     Home Exercise Plan   Plans to continue exercise at Home (comment)   Frequency Add 2 additional days to program exercise sessions.      Functional Capacity:     6 Minute Walk    Row Name 09/11/16 1154         6 Minute Walk   Phase Initial     Distance 1000 feet     Distance % Change 0 %     Walk Time 6 minutes     # of Rest Breaks 0     MPH 1.89     METS 2.45     RPE 11     Perceived Dyspnea  11     VO2 Peak 9.05     Symptoms No     Resting HR 63 bpm  Resting BP 120/72     Max Ex. HR 75 bpm     Max Ex. BP 156/82     2 Minute Post BP 136/72        Psychological, QOL, Others - Outcomes: PHQ 2/9: Depression screen [facility] 2/9 09/11/2016 07/24/2016 04/23/2016  Decreased Interest 0 0 3  Down, Depressed, Hopeless 1 0 2  PHQ - 2 Score 1 0 5  Altered sleeping 1 - 3  Tired, decreased energy 0 - 0  Change in appetite 1 - 0  Feeling bad or failure about yourself  1 - 0  Trouble concentrating 0 - 0  Moving slowly or fidgety/restless 0 - 0  Suicidal thoughts - - 0  PHQ-9 Score 4 - 8  Difficult doing work/chores Not difficult at all - -    Quality of Life:     Quality of Life - 09/11/16 1158      Quality of Life Scores   Health/Function Pre 24.3 %   Socioeconomic Pre 25.07 %   Psych/Spiritual Pre 25.36 %   Family Pre 17.5 %   GLOBAL Pre 24.06 %      Personal Goals: Goals established at orientation with interventions provided to work toward goal.     Personal Goals and Risk Factors at Admission - 09/11/16 1304      Core Components/Risk Factors/Patient Goals on Admission    Weight Management Obesity   Hypertension Yes   Intervention Provide education on lifestyle modifcations including regular  physical activity/exercise, weight management, moderate sodium restriction and increased consumption of fresh fruit, vegetables, and low fat dairy, alcohol moderation, and smoking cessation.;Monitor prescription use compliance.   Expected Outcomes Short Term: Continued assessment and intervention until BP is < 140/37mm HG in hypertensive participants. < 130/57mm HG in hypertensive participants with diabetes, heart failure or chronic kidney disease.;Long Term: Maintenance of blood pressure at goal levels.   Personal Goal Other Yes   Personal Goal Gain strength and stamina; be able to walk dog further.    Intervention Patient will attend cardiac rehab 3 days/week and supplement exercise 2 days/wk at home.    Expected Outcomes Patient will meet her personal goals.        Personal Goals Discharge:     Goals and Risk Factor Review    Row Name 09/11/16 1306             Core Components/Risk Factors/Patient Goals Review   Personal Goals Review Weight Management/Obesity          Nutrition & Weight - Outcomes:     Pre Biometrics - 09/11/16 1157      Pre Biometrics   Height 5\' 4"  (1.626 m)   Waist Circumference 35 inches   Hip Circumference 42.5 inches   Waist to Hip Ratio 0.82 %   Triceps Skinfold 17 mm   % Body Fat 37.4 %   Grip Strength 68.33 kg   Flexibility 0 in  Lumbar back pain.   Single Leg Stand 13 seconds       Nutrition:   Nutrition Discharge:   Education Questionnaire Score:     Knowledge Questionnaire Score - 09/11/16 1301      Knowledge Questionnaire Score   Pre Score 20/24

## 2016-10-10 ENCOUNTER — Encounter (HOSPITAL_COMMUNITY): Payer: Medicare Other

## 2016-10-13 ENCOUNTER — Encounter (HOSPITAL_COMMUNITY): Payer: Medicare Other

## 2016-10-15 ENCOUNTER — Encounter (HOSPITAL_COMMUNITY): Payer: Medicare Other

## 2016-10-17 ENCOUNTER — Encounter (HOSPITAL_COMMUNITY): Payer: Medicare Other

## 2016-10-20 ENCOUNTER — Encounter (HOSPITAL_COMMUNITY): Payer: Medicare Other

## 2016-10-22 ENCOUNTER — Ambulatory Visit (INDEPENDENT_AMBULATORY_CARE_PROVIDER_SITE_OTHER): Payer: Medicare Other

## 2016-10-22 ENCOUNTER — Other Ambulatory Visit: Payer: Self-pay | Admitting: Cardiovascular Disease

## 2016-10-22 ENCOUNTER — Encounter (HOSPITAL_COMMUNITY): Payer: Medicare Other

## 2016-10-22 DIAGNOSIS — Z79899 Other long term (current) drug therapy: Secondary | ICD-10-CM

## 2016-10-22 DIAGNOSIS — I493 Ventricular premature depolarization: Secondary | ICD-10-CM | POA: Diagnosis not present

## 2016-10-23 ENCOUNTER — Ambulatory Visit (INDEPENDENT_AMBULATORY_CARE_PROVIDER_SITE_OTHER): Payer: Medicare Other | Admitting: Family Medicine

## 2016-10-23 ENCOUNTER — Encounter: Payer: Self-pay | Admitting: Family Medicine

## 2016-10-23 VITALS — BP 124/80 | HR 72 | Temp 97.3°F | Resp 18 | Ht 64.0 in | Wt 175.0 lb

## 2016-10-23 DIAGNOSIS — Z8639 Personal history of other endocrine, nutritional and metabolic disease: Secondary | ICD-10-CM

## 2016-10-23 DIAGNOSIS — I10 Essential (primary) hypertension: Secondary | ICD-10-CM

## 2016-10-23 DIAGNOSIS — E559 Vitamin D deficiency, unspecified: Secondary | ICD-10-CM

## 2016-10-23 DIAGNOSIS — E785 Hyperlipidemia, unspecified: Secondary | ICD-10-CM

## 2016-10-23 DIAGNOSIS — Z951 Presence of aortocoronary bypass graft: Secondary | ICD-10-CM

## 2016-10-23 DIAGNOSIS — I2510 Atherosclerotic heart disease of native coronary artery without angina pectoris: Secondary | ICD-10-CM

## 2016-10-23 LAB — CBC
HCT: 41 % (ref 35.0–45.0)
Hemoglobin: 13.1 g/dL (ref 11.7–15.5)
MCH: 26 pg — ABNORMAL LOW (ref 27.0–33.0)
MCHC: 32 g/dL (ref 32.0–36.0)
MCV: 81.5 fL (ref 80.0–100.0)
MPV: 10.1 fL (ref 7.5–12.5)
Platelets: 235 10*3/uL (ref 140–400)
RBC: 5.03 MIL/uL (ref 3.80–5.10)
RDW: 18.7 % — AB (ref 11.0–15.0)
WBC: 10.7 10*3/uL (ref 3.8–10.8)

## 2016-10-23 LAB — COMPLETE METABOLIC PANEL WITH GFR
AG Ratio: 1.7 Ratio (ref 1.0–2.5)
ALT: 15 U/L (ref 6–29)
AST: 15 U/L (ref 10–35)
Albumin: 4 g/dL (ref 3.6–5.1)
Alkaline Phosphatase: 64 U/L (ref 33–130)
BILIRUBIN TOTAL: 0.4 mg/dL (ref 0.2–1.2)
BUN/Creatinine Ratio: 19.7 Ratio (ref 6–22)
BUN: 13 mg/dL (ref 7–25)
CO2: 30 mmol/L (ref 20–31)
Calcium: 9.2 mg/dL (ref 8.6–10.4)
Chloride: 100 mmol/L (ref 98–110)
Creat: 0.66 mg/dL (ref 0.50–0.99)
Globulin: 2.4 g/dL (ref 1.9–3.7)
Glucose, Bld: 80 mg/dL (ref 65–99)
Potassium: 3.5 mmol/L (ref 3.5–5.3)
SODIUM: 141 mmol/L (ref 135–146)
Total Protein: 6.4 g/dL (ref 6.1–8.1)

## 2016-10-23 LAB — LIPID PANEL
CHOLESTEROL: 150 mg/dL (ref <200)
HDL: 26 mg/dL — AB (ref >50)
LDL Cholesterol: 91 mg/dL (ref <100)
TRIGLYCERIDES: 163 mg/dL — AB (ref <150)
Total CHOL/HDL Ratio: 5.8 Ratio — ABNORMAL HIGH (ref <5.0)
VLDL: 33 mg/dL — ABNORMAL HIGH (ref <30)

## 2016-10-23 NOTE — Progress Notes (Signed)
Cardiology Office Note    Date:  10/29/2016   ID:  Suki, Crockett 07-27-1955, MRN [id number]  PCP:  Raylene Everts, MD  Cardiologist:  Dr. Johnsie Cancel  Chief Complaint: Hospital follow up s/p CABG  History of Present Illness:   Donna Woodward is a 61 y.o. female with HTN, HLD, previous smoker and recent CABG presents for follow up.   Seen by me  for initial consult for chest pain 05/07/16. Nuclear stress test 05/20/16 showed no diagnostic ST segment abnormalities, rare PVCs, moderate sized, moderate intensity, reversible inferolateral defect from the apex to the base with partial reversibility at the very base suggestive of ischemia. It was considered an intermediate risk study LVEF 69%.  F/u Cath 06/05/16 showed 3V disease s/p CABG x 3 (LIMA to LAD; SVG to posterior descending; SVG to diagonal). The patient was suffered ventricular ectopy.  Her beta blocker was increased and she was started on Amiodarone.  Per discharge summaries, amiodarone will be short term. She required diuresis for volume overload.   Holter monitor reviewed:  NSR rates 51-119 PVC;s 7/hr some short bursts NSVT 5 beats and PSVT same 5-6 beats   Past Medical History:  Diagnosis Date  . Anxiety   . Back pain   . Coronary artery disease   . Degenerative joint disease (DJD) of lumbar spine 03/24/2016  . Depression   . GERD (gastroesophageal reflux disease)    if overweight  . Hyperlipidemia   . Hypertension     Past Surgical History:  Procedure Laterality Date  . BACK SURGERY    . CARDIAC CATHETERIZATION N/A 06/05/2016   Procedure: Left Heart Cath and Coronary Angiography;  Surgeon: Belva Crome, MD;  Location: [facility];  Service: Cardiovascular;  Laterality: N/A;  . CORONARY ARTERY BYPASS GRAFT N/A 06/17/2016   Procedure: CORONARY ARTERY BYPASS GRAFTING (CABG), ON PUMP, TIMES THREE, USING LEFT INTERNAL MAMMARY ARTERY, RIGHT GREATER SAPHENOUS VEIN HARVESTED ENDOSCOPICALLY;  Surgeon: Ivin Poot, MD;  Location: [facility];  Service: Open Heart Surgery;  Laterality: N/A;  . MULTIPLE TOOTH EXTRACTIONS    . [place]   lamnectomy L5S1  . TEE WITHOUT CARDIOVERSION N/A 06/17/2016   Procedure: TRANSESOPHAGEAL ECHOCARDIOGRAM (TEE);  Surgeon: Ivin Poot, MD;  Location: [facility];  Service: Open Heart Surgery;  Laterality: N/A;    Current Medications: Prior to Admission medications   Medication Sig Start Date End Date Taking? Authorizing Provider  amiodarone (PACERONE) 200 MG tablet Take 1 tablet (200 mg total) by mouth 2 (two) times daily. For one week;then take Amiodarone 200 mg daily by mouth thereafter 06/24/16   Nani Skillern, PA-C  aspirin EC 325 MG EC tablet Take 1 tablet (325 mg total) by mouth daily. 06/24/16   Donielle Liston Alba, PA-C  guaiFENesin (MUCINEX) 600 MG 12 hr tablet Take 1 tablet (600 mg total) by mouth 2 (two) times daily as needed for cough. 06/24/16   Donielle Liston Alba, PA-C  lisinopril-hydrochlorothiazide (PRINZIDE,ZESTORETIC) 20-25 MG tablet Take 1 tablet by mouth daily. Patient taking differently: Take 1 tablet by mouth daily. Taken in the morning 03/24/16   Raylene Everts, MD  metoprolol succinate (TOPROL-XL) 50 MG 24 hr tablet Take 1 tablet (50 mg total) by mouth daily. Take with or immediately following a meal. Patient taking differently: Take 50 mg by mouth. Take with or immediately following a meal; in the morning 03/24/16   Raylene Everts, MD  omega-3 fish oil (  MAXEPA) 1000 MG CAPS capsule Take 1 capsule (1,000 mg total) by mouth 2 (two) times daily. Reported on 11/14/2015 03/24/16   Raylene Everts, MD  potassium chloride SA (K-DUR,KLOR-CON) 20 MEQ tablet Take 1 tablet (20 mEq total) by mouth daily. Patient taking differently: Take 20 mEq by mouth daily. Taken at night 06/04/16   Imogene Burn, PA-C  simvastatin (ZOCOR) 20 MG tablet Take 1 tablet (20 mg total) by mouth at bedtime. 03/24/16   Raylene Everts, MD  traMADol (ULTRAM)  50 MG tablet Take 1 tablet (50 mg total) by mouth every 4 (four) hours as needed for moderate pain. 06/24/16   Donielle Liston Alba, PA-C    Allergies:   Patient has no known allergies.   Social History   Social History  . Marital status: Single    Spouse name: N/A  . Number of children: N/A  . Years of education: 1   Occupational History  . disabled     warehouse   Social History Main Topics  . Smoking status: Former Smoker    Quit date: 06/30/2006  . Smokeless tobacco: Never Used  . Alcohol use Yes     Comment: rare  . Drug use: Yes    Types: Marijuana  . Sexual activity: Not Currently   Other Topics Concern  . None   Social History Narrative   Masters in Designer, television/film set alone   Disabled with back pain     Family History:  The patient's family history includes CAD (age of onset: 42) in her sister; Cancer (age of onset: 77) in her sister; Dementia in her father; Heart disease in her father; Hypertension in her mother, sister, sister, and sister; Stroke in her father.   ROS:   Please see the history of present illness.    ROS All other systems reviewed and are negative.   PHYSICAL EXAM:   VS:  BP 140/80   Pulse 80   Ht 5\' 5"  (1.651 m)   Wt 172 lb 12.8 oz (78.4 kg)   SpO2 96%   BMI 28.76 kg/m    Affect appropriate Healthy:  appears stated age HEENT: normal Neck supple with no adenopathy JVP normal no bruits no thyromegaly Lungs clear with no wheezing and good diaphragmatic motion Heart:  S1/S2 no murmur, no rub, gallop or click post sternotomy well healed  PMI normal Abdomen: benighn, BS positve, no tenderness, no AAA no bruit.  No HSM or HJR Distal pulses intact with no bruits No edema Neuro non-focal Skin warm and dry No muscular weakness Venectomy scars RU thigh and calf and L calf healed well Multiple skin tatoos    Wt Readings from Last 3 Encounters:  10/29/16 172 lb 12.8 oz (78.4 kg)  10/23/16 175 lb (79.4 kg)  09/11/16 181 lb  (82.1 kg)      Studies/Labs Reviewed:   EKG:   NSR rate 62 normal 06/18/16   Recent Labs: 06/18/2016: Magnesium 2.1 10/23/2016: ALT 15; BUN 13; Creat 0.66; Hemoglobin 13.1; Platelets 235; Potassium 3.5; Sodium 141   Lipid Panel    Component Value Date/Time   CHOL 150 10/23/2016 1450   TRIG 163 (H) 10/23/2016 1450   HDL 26 (L) 10/23/2016 1450   CHOLHDL 5.8 (H) 10/23/2016 1450   VLDL 33 (H) 10/23/2016 1450   LDLCALC 91 10/23/2016 1450    Additional studies/ records that were reviewed today include:   TEE 06/17/16 Result status: Final result   Left ventricle: Normal  cavity size and wall thickness. LV systolic function is low normal with an EF of 50-55%. No thrombus present. No mass present.  Septum: No Patent Foramen Ovale present. The interatrial septum bows to the left consistent with high right atrial pressure.  Left atrium: Patent foramen ovale not present.  Aortic valve: The valve is trileaflet. Mild valve calcification present. No stenosis. No stenosis by continuous wave doppler Trace regurgitation.  Mitral valve: Mild leaflet thickening is present. Trace regurgitation.  Right ventricle: Normal cavity size and ejection fraction. There is mild hypertrophy.  Tricuspid valve: Mild regurgitation. The tricuspid valve regurgitation jet is central.     Cardiac Catheterization:  06/05/16 Left Heart Cath and Coronary Angiography  Conclusion    Severe calcific, diffuse three-vessel coronary disease.  Codominant LAD/diagonal system with severe diffuse disease in each vessel with graftable distal targets.  Eccentric 30-60% ostial left main depending upon review.  99% proximal to mid RCA. Total occlusion at the ostium of a large PDA that fills left-to-right collaterals.  Moderate mid circumflex disease but without graftable targets beyond the stenosis.  Normal left ventricular systolic function with normal hemodynamics   RECOMMENDATIONS:   Continue current  medical regimen.  Expedient evaluation by [facility] for multivessel coronary bypass grafting.       ASSESSMENT & PLAN:    1. CAD s/p CABG x 3 - No anginal pain. Continue ASA and Statin and beta blocker   2. HTN - Well controlled.  Continue current medications and low sodium Dash type diet.    3. HLD - 10/23/2016: Cholesterol 150; HDL 26; LDL Cholesterol 91; Triglycerides 163; VLDL 33  - Continue statin. Due for LFT and lipid panel  --> she will f/u with PCP.   4. PVC;s  - not that frequent and asymptomatic increase Toprol to 100 mg daily   F/U with me in 3 months   Jenkins Rouge

## 2016-10-23 NOTE — Patient Instructions (Addendum)
Walk every day that you are able  Continue same medicines  Need lab tests today  See me in six months  Due for mammogram in July

## 2016-10-23 NOTE — Progress Notes (Signed)
Chief Complaint  Patient presents with  . Follow-up    3 month  Here for routine follow-up. She is due for lipid testing. She is compliant with medication. She is trying to walk and exercise daily. She has lost 10 pounds. She states her appetite and energy are good. She is trying to eat a low fat heart healthy diet.Her blood pressure is well controlled. Her emotion is stable. Not on depression medication. She has no chest pain or shortness of breath. She has kept her appropriate follow-up with cardiology. She did have a spell of low back pain and states she was taking acetaminophen and resting, using heat for about 10 days. This is resolved. Patient Active Problem List   Diagnosis Date Noted  . S/P CABG x 3 06/17/2016  . Family history of early CAD 06/04/2016  . Chest pain 06/04/2016  . Abnormal nuclear stress test 06/04/2016  . Degenerative joint disease (DJD) of lumbar spine 03/24/2016  . Depression (emotion) 03/24/2016  . Obesity 08/14/2015  . Essential hypertension, benign 04/29/2015  . Hyperlipidemia 04/29/2015    Outpatient Encounter Prescriptions as of 10/23/2016  Medication Sig  . aspirin EC 325 MG EC tablet Take 1 tablet (325 mg total) by mouth daily.  Marland Kitchen lisinopril-hydrochlorothiazide (PRINZIDE,ZESTORETIC) 20-25 MG tablet Take 1 tablet by mouth every evening.  . metoprolol succinate (TOPROL-XL) 50 MG 24 hr tablet Take 50 mg by mouth daily. Take with or immediately following a meal.  . omega-3 fish oil (MAXEPA) 1000 MG CAPS capsule Take 1 capsule (1,000 mg total) by mouth 2 (two) times daily. Reported on 11/14/2015  . simvastatin (ZOCOR) 20 MG tablet Take 20 mg by mouth daily.   No facility-administered encounter medications on file as of 10/23/2016.     No Known Allergies  Review of Systems  Constitutional: Negative for activity change, appetite change and unexpected weight change.  HENT: Negative for congestion, dental problem, postnasal drip and rhinorrhea.   Eyes:  Negative for redness and visual disturbance.  Respiratory: Negative for cough and shortness of breath.   Cardiovascular: Negative for chest pain, palpitations and leg swelling.  Gastrointestinal: Negative for abdominal pain, constipation and diarrhea.  Genitourinary: Negative for difficulty urinating and frequency.  Musculoskeletal: Negative for arthralgias and back pain.  Neurological: Negative for dizziness and headaches.  Psychiatric/Behavioral: Negative for dysphoric mood and sleep disturbance. The patient is not nervous/anxious.     BP 124/80 (BP Location: Right Arm, Patient Position: Sitting, Cuff Size: Normal)   Pulse 72   Temp 97.3 F (36.3 C) (Temporal)   Resp 18   Ht 5\' 4"  (1.626 m)   Wt 175 lb (79.4 kg)   SpO2 98%   BMI 30.04 kg/m   Physical Exam  Constitutional: She is oriented to person, place, and time. She appears well-developed and well-nourished.  HENT:  Head: Normocephalic and atraumatic.  Mouth/Throat: Oropharynx is clear and moist.  Eyes: Conjunctivae are normal. Pupils are equal, round, and reactive to light.  Neck: Normal range of motion. Neck supple. No thyromegaly present.  Cardiovascular: Normal rate, regular rhythm and normal heart sounds.   Well healed sternotomy scar  Pulmonary/Chest: Effort normal and breath sounds normal. No respiratory distress.  Abdominal: Soft. Bowel sounds are normal.  Musculoskeletal: Normal range of motion. She exhibits no edema.  Lymphadenopathy:    She has no cervical adenopathy.  Neurological: She is alert and oriented to person, place, and time.  Gait normal  Skin: Skin is warm and dry.  Multiple  tattoos  Psychiatric: She has a normal mood and affect. Her behavior is normal. Thought content normal.  Gregarious  Nursing note and vitals reviewed.   ASSESSMENT/PLAN:  1. Hyperlipidemia, unspecified hyperlipidemia type  - CBC - COMPLETE METABOLIC PANEL WITH GFR - Lipid panel  2. Coronary artery disease involving  native coronary artery of native heart without angina pectoris   3. Essential hypertension, benign   4. Hx of CABG   5. Vitamin D deficiency  - VITAMIN D 25 Hydroxy (Vit-D Deficiency, Fractures)   Patient Instructions  Walk every day that you are able  Continue same medicines  Need lab tests today  See me in six months  Due for mammogram in July   Raylene Everts, MD

## 2016-10-24 ENCOUNTER — Encounter: Payer: Self-pay | Admitting: Family Medicine

## 2016-10-24 ENCOUNTER — Encounter (HOSPITAL_COMMUNITY): Payer: Medicare Other

## 2016-10-24 ENCOUNTER — Other Ambulatory Visit: Payer: Self-pay | Admitting: Family Medicine

## 2016-10-24 LAB — URINALYSIS, ROUTINE W REFLEX MICROSCOPIC
Bilirubin Urine: NEGATIVE
GLUCOSE, UA: NEGATIVE
Hgb urine dipstick: NEGATIVE
Ketones, ur: NEGATIVE
LEUKOCYTES UA: NEGATIVE
Nitrite: NEGATIVE
PH: 6 (ref 5.0–8.0)
Protein, ur: NEGATIVE
SPECIFIC GRAVITY, URINE: 1.02 (ref 1.001–1.035)

## 2016-10-24 LAB — VITAMIN D 25 HYDROXY (VIT D DEFICIENCY, FRACTURES): Vit D, 25-Hydroxy: 33 ng/mL (ref 30–100)

## 2016-10-24 MED ORDER — SIMVASTATIN 40 MG PO TABS: 40.0000 mg | ORAL_TABLET | Freq: Every day | ORAL | 11 refills | Status: DC

## 2016-10-27 ENCOUNTER — Encounter: Payer: Self-pay | Admitting: Family Medicine

## 2016-10-27 ENCOUNTER — Encounter (HOSPITAL_COMMUNITY): Payer: Medicare Other

## 2016-10-27 ENCOUNTER — Telehealth: Payer: Self-pay

## 2016-10-27 NOTE — Telephone Encounter (Signed)
msg forwarded to md

## 2016-10-29 ENCOUNTER — Encounter (HOSPITAL_COMMUNITY): Payer: Medicare Other

## 2016-10-29 ENCOUNTER — Encounter: Payer: Self-pay | Admitting: Cardiovascular Disease

## 2016-10-29 ENCOUNTER — Ambulatory Visit (INDEPENDENT_AMBULATORY_CARE_PROVIDER_SITE_OTHER): Payer: Medicare Other | Admitting: Cardiovascular Disease

## 2016-10-29 VITALS — BP 140/80 | HR 80 | Ht 65.0 in | Wt 172.8 lb

## 2016-10-29 DIAGNOSIS — Z79899 Other long term (current) drug therapy: Secondary | ICD-10-CM | POA: Diagnosis not present

## 2016-10-29 DIAGNOSIS — I2510 Atherosclerotic heart disease of native coronary artery without angina pectoris: Secondary | ICD-10-CM

## 2016-10-29 MED ORDER — METOPROLOL SUCCINATE ER 100 MG PO TB24: 100.0000 mg | ORAL_TABLET | Freq: Every day | ORAL | 3 refills | Status: DC

## 2016-10-29 NOTE — Patient Instructions (Addendum)
Medication Instructions:  Your physician has recommended you make the following change in your medication:  1-INCREASE Metoprolol 100 mg by mouth daily.  Labwork: NONE  Testing/Procedures: NONE  Follow-Up: Your physician wants you to follow-up in: 3 months with Dr. Johnsie Cancel.   If you need a refill on your cardiac medications before your next appointment, please call your pharmacy.

## 2016-10-31 ENCOUNTER — Encounter (HOSPITAL_COMMUNITY): Payer: Medicare Other

## 2016-11-03 ENCOUNTER — Encounter (HOSPITAL_COMMUNITY): Payer: Medicare Other

## 2016-11-05 ENCOUNTER — Encounter (HOSPITAL_COMMUNITY): Payer: Medicare Other

## 2016-11-06 DIAGNOSIS — D2312 Other benign neoplasm of skin of left eyelid, including canthus: Secondary | ICD-10-CM | POA: Diagnosis not present

## 2016-11-07 ENCOUNTER — Encounter (HOSPITAL_COMMUNITY): Payer: Medicare Other

## 2016-11-10 ENCOUNTER — Encounter (HOSPITAL_COMMUNITY): Payer: Medicare Other

## 2016-11-10 ENCOUNTER — Encounter: Payer: Self-pay | Admitting: Family Medicine

## 2016-11-10 ENCOUNTER — Other Ambulatory Visit: Payer: Self-pay | Admitting: Family Medicine

## 2016-11-10 ENCOUNTER — Encounter: Payer: Self-pay | Admitting: Cardiovascular Disease

## 2016-11-10 MED ORDER — TIZANIDINE HCL 4 MG PO TABS: 4.0000 mg | ORAL_TABLET | Freq: Four times a day (QID) | ORAL | 0 refills | Status: DC | PRN

## 2016-11-12 ENCOUNTER — Encounter (HOSPITAL_COMMUNITY): Payer: Medicare Other

## 2016-11-14 ENCOUNTER — Encounter (HOSPITAL_COMMUNITY): Payer: Medicare Other

## 2016-11-17 ENCOUNTER — Encounter (HOSPITAL_COMMUNITY): Payer: Medicare Other

## 2016-11-17 ENCOUNTER — Other Ambulatory Visit: Payer: Self-pay | Admitting: Family Medicine

## 2016-11-17 DIAGNOSIS — M47816 Spondylosis without myelopathy or radiculopathy, lumbar region: Secondary | ICD-10-CM

## 2016-11-19 ENCOUNTER — Encounter (HOSPITAL_COMMUNITY): Payer: Medicare Other

## 2016-11-21 ENCOUNTER — Encounter (HOSPITAL_COMMUNITY): Payer: Medicare Other

## 2016-11-24 ENCOUNTER — Encounter (HOSPITAL_COMMUNITY): Payer: Medicare Other

## 2016-11-26 ENCOUNTER — Encounter (HOSPITAL_COMMUNITY): Payer: Medicare Other

## 2016-11-28 ENCOUNTER — Encounter (HOSPITAL_COMMUNITY): Payer: Medicare Other

## 2016-11-30 ENCOUNTER — Other Ambulatory Visit: Payer: Self-pay | Admitting: Family Medicine

## 2016-12-01 ENCOUNTER — Encounter (HOSPITAL_COMMUNITY): Payer: Medicare Other

## 2016-12-02 ENCOUNTER — Encounter (HOSPITAL_COMMUNITY): Payer: Self-pay

## 2016-12-02 ENCOUNTER — Ambulatory Visit (HOSPITAL_COMMUNITY): Payer: Medicare Other | Attending: Family Medicine

## 2016-12-02 DIAGNOSIS — R29898 Other symptoms and signs involving the musculoskeletal system: Secondary | ICD-10-CM | POA: Diagnosis not present

## 2016-12-02 DIAGNOSIS — M6281 Muscle weakness (generalized): Secondary | ICD-10-CM | POA: Diagnosis not present

## 2016-12-02 DIAGNOSIS — R293 Abnormal posture: Secondary | ICD-10-CM

## 2016-12-02 DIAGNOSIS — G8929 Other chronic pain: Secondary | ICD-10-CM | POA: Insufficient documentation

## 2016-12-02 DIAGNOSIS — R262 Difficulty in walking, not elsewhere classified: Secondary | ICD-10-CM | POA: Diagnosis not present

## 2016-12-02 DIAGNOSIS — M545 Low back pain, unspecified: Secondary | ICD-10-CM

## 2016-12-02 NOTE — Therapy (Signed)
[facility] [address] [place], [place], [address] Phone: [phone]   Fax:  [phone]  Physical Therapy Evaluation  Patient Details  Name: Donna Woodward MRN: [id number] Date of Birth: 02/06/1956 Referring Provider: Raylene Everts, MD  Encounter Date: 12/02/2016      PT End of Session - 12/02/16 1532    Visit Number 1   Number of Visits 9   Date for PT Re-Evaluation 12/30/16   Authorization Type Medicare Part A and B   Authorization Time Period 12/02/16 to 12/30/16   Authorization - Visit Number 1   Authorization - Number of Visits 10   PT Start Time 1440   PT Stop Time 7253   PT Time Calculation (min) 35 min   Activity Tolerance Patient tolerated treatment well;No increased pain   Behavior During Therapy WFL for tasks assessed/performed      Past Medical History:  Diagnosis Date  . Anxiety   . Back pain   . Coronary artery disease   . Degenerative joint disease (DJD) of lumbar spine 03/24/2016  . Depression   . GERD (gastroesophageal reflux disease)    if overweight  . Hyperlipidemia   . Hypertension     Past Surgical History:  Procedure Laterality Date  . BACK SURGERY    . CARDIAC CATHETERIZATION N/A 06/05/2016   Procedure: Left Heart Cath and Coronary Angiography;  Surgeon: Belva Crome, MD;  Location: [facility];  Service: Cardiovascular;  Laterality: N/A;  . CORONARY ARTERY BYPASS GRAFT N/A 06/17/2016   Procedure: CORONARY ARTERY BYPASS GRAFTING (CABG), ON PUMP, TIMES THREE, USING LEFT INTERNAL MAMMARY ARTERY, RIGHT GREATER SAPHENOUS VEIN HARVESTED ENDOSCOPICALLY;  Surgeon: Ivin Poot, MD;  Location: [facility];  Service: Open Heart Surgery;  Laterality: N/A;  . MULTIPLE TOOTH EXTRACTIONS    . [place]   lamnectomy L5S1  . TEE WITHOUT CARDIOVERSION N/A 06/17/2016   Procedure: TRANSESOPHAGEAL ECHOCARDIOGRAM (TEE);  Surgeon: Ivin Poot, MD;  Location: [facility];  Service: Open Heart Surgery;   Laterality: N/A;    There were no vitals filed for this visit.       Subjective Assessment - 12/02/16 1444    Subjective Pt states that she has had LBP for multiple years. She states that her current pain is at a 9/10 constantly. She had back surgery 25-30 years ago when she had a herniated disc and she states that her current pain feels like she did then when she herniated her disc. Her current episode started after she did light yard work and the next day she had a lot of pain. Her pain is located at her L lower back/glute area and it radiates to both sides of her back and down the anterior L thigh. Getting out of bed, walking, bending forward, squatting all aggravate her pain. She denies b/b changes but does report intermittent numbness in her L foot. Pt stated she had a CABG x 3 on 06/17/16.    Pertinent History chronic LBP, CABG x 3 on 06/17/16,    Limitations Lifting;Walking;House hold activities   How long can you sit comfortably? no issues when in her recliner at home, otherwise 30 mins   How long can you stand comfortably? less than 5 mins   How long can you walk comfortably? less than 10 mins   Patient Stated Goals lower pain level,    Currently in Pain? Yes   Pain Score 7    Pain  Location Back   Pain Orientation Lower;Left   Pain Descriptors / Indicators Aching;Dull   Pain Type Chronic pain   Pain Onset More than a month ago   Pain Frequency Constant   Aggravating Factors  walking, standing, getting out of bed   Pain Relieving Factors nothing   Effect of Pain on Daily Activities increases            [facility] PT Assessment - 12/02/16 0001      Assessment   Medical Diagnosis Spondylosis of lumbar region without myelopathy or radiculopathy   Referring Provider Raylene Everts, MD   Onset Date/Surgical Date 10/09/15   Next MD Visit October 2018   Prior Therapy yes, for same issue, with minimal success     Balance Screen   Has the patient fallen in the past 6 months No    Has the patient had a decrease in activity level because of a fear of falling?  No   Is the patient reluctant to leave their home because of a fear of falling?  No     Prior Function   Level of Independence Independent;Independent with basic ADLs     Observation/Other Assessments   Focus on Therapeutic Outcomes (FOTO)  74% limitation     Sensation   Light Touch Impaired Detail   Light Touch Impaired Details Impaired LLE   Additional Comments L L5 and S1 diminished     Posture/Postural Control   Posture/Postural Control Postural limitations   Postural Limitations Rounded Shoulders;Forward head;Decreased thoracic kyphosis     ROM / Strength   AROM / PROM / Strength AROM;Strength     AROM   Lumbar Flexion mod limitation   Lumbar Extension max limitations   Lumbar - Right Side Bend min limitation   Lumbar - Left Side Bend min limitation   Lumbar - Right Rotation min to no limitation   Lumbar - Left Rotation min to no limitation     Strength   Right Hip Flexion 4+/5   Right Hip Extension 3/5   Right Hip ABduction 4/5  sitting   Left Hip Flexion 4+/5   Left Hip Extension 3/5   Left Hip ABduction 4/5  sitting   Right Knee Flexion 5/5   Right Knee Extension 5/5   Left Knee Flexion 5/5   Left Knee Extension 5/5   Right Ankle Dorsiflexion 4+/5   Left Ankle Dorsiflexion 4/5     Flexibility   Soft Tissue Assessment /Muscle Length yes   Hamstrings R tighter than L; R recreated pt's L LBP   Quadriceps + Ely's BLE, R>L; both recreated pt's L LBP     Palpation   Spinal mobility CPAs not assessed this date as pt was very tender to palpation of lumbar paraspinals   Palpation comment increased soft tissue restrictions and tenderness to palpation of lumbar paraspinals and gluteals bilaterally, L>R     Ambulation/Gait   Ambulation Distance (Feet) 30 Feet  throughout gym   Assistive device Straight cane   Gait Pattern Step-through pattern;Decreased arm swing - right;Decreased  arm swing - left;Decreased stride length;Decreased weight shift to right;Decreased weight shift to left;Antalgic  bil genu valgus, decr hip ext, forward flexed trunk/hips     Standardized Balance Assessment   Standardized Balance Assessment Five Times Sit to Stand   Five times sit to stand comments  32 sec from chair with no UE support            Objective measurements completed on examination:  See above findings.                  PT Education - 12/02/16 1531    Education provided Yes   Education Details exam findings, POC, HEP   Person(s) Educated Patient   Methods Explanation;Demonstration;Handout   Comprehension Verbalized understanding;Returned demonstration          PT Short Term Goals - 12/02/16 1606      PT SHORT TERM GOAL #1   Title Pt will be independent with HEP and perform consistently to maximize return to PLOF.   Time 2   Period Weeks   Status New     PT SHORT TERM GOAL #2   Title Pt will have improved MMT to 4+/5 throughout BLE to demosntrate improved overall strength to maximize function and gait.   Time 2   Period Weeks   Status New     PT SHORT TERM GOAL #3   Title Pt will report being able to wash dishes for 30 mins or > to demonstrate improved tolerance to standing in order maximize participation at home.   Time 2   Period Weeks   Status New           PT Long Term Goals - 12/02/16 1607      PT LONG TERM GOAL #1   Title Pt will have improved 5xSTS to 15 sec or < to demonstrate improved overall BLE strength and power in order to maximize functional tasks at home and in the community.   Time 4   Period Weeks   Status New     PT LONG TERM GOAL #2   Title Pt will have improved lumbar extension ROM by 50% (improve to moderate limitations or better) with minimal to no LBP and no reports of radicular pain to demonstrate improved function and decrease pain overall.    Baseline maximum limitation for extension   Time 4   Period  Weeks   Status New     PT LONG TERM GOAL #3   Title Pt will have improved TUG to 10 sec or < with LRAD in order to demonstrate improved overall function and maximzie participation at home and in the community.    Time 4   Period Weeks   Status New                Plan - 12/02/16 1532    Clinical Impression Statement Pt is 61 YO F who presents to OPPT with c/o chronic LBP with LLE radicular symptoms. Assessment was limited due to patient arriving late for appointment. She has deficits in lumbar ROM (especially extension), BLE strength, core strength, BLE flexibility, and gait, as well as increased soft tissue restrictions of lumbar paraspinals and gluteals. Passive SLR of the RLE repordiced pt's L sided L LBP. R hip PROM recreated her LBP. R HS flexibility testing recreated her L sided LBP. Bil quad flexibility testing recreated L sided LBP, especially on with the RLE. Potential neural involvement playing a role in pt's symptoms due to positive R SLR. REIS and RFIS (x 2-3 reps each) increased her LLE radicular symptoms. Will assess scour's, manual hip distraction, and SI cluster testing next session. Pt was seen a this clinic about 6 months ago for same complaints and reported that it did not help at all, and she reported non-compliance to HEP. Pt also verbalized during session that she is attempting to get approved for disability, which is another reason why she came to  PT. Pt would benefit from brief trial of skilled PT services to address deficits in order to maximize overall function and QOL.    History and Personal Factors relevant to plan of care: CABG x 3 on 06/17/16; HTN   Clinical Presentation Evolving   Clinical Presentation due to: chronicity of issue, positive R SLR indicating potential neural involvement; co-morbidities, previous non-compliance with HEP and per subjective reports poor success with PT services in the past,    Clinical Decision Making Moderate   Rehab Potential Fair    PT Frequency 2x / week   PT Duration 4 weeks   PT Treatment/Interventions ADLs/Self Care Home Management;DME Instruction;Gait training;Stair training;Functional mobility training;Therapeutic activities;Therapeutic exercise;Balance training;Neuromuscular re-education;Patient/family education;Manual techniques;Passive range of motion;Energy conservation;Taping   PT Next Visit Plan review goals and how HEP went but do not issue copy of eval; hip scour, manual hip distraction, SI cluster testing, TUG, SLS, maybe 3MWT, quad stretching, initiate HS stretching, trial SKTC/DKTC and piriformis stretching, bridging, clamshells, manual to lumbar paraspinals and glutes   PT Home Exercise Plan eval: supine quad stretch   Consulted and Agree with Plan of Care Patient      Patient will benefit from skilled therapeutic intervention in order to improve the following deficits and impairments:  Abnormal gait, Cardiopulmonary status limiting activity, Decreased activity tolerance, Decreased balance, Decreased endurance, Decreased mobility, Decreased range of motion, Decreased strength, Difficulty walking, Hypomobility, Increased muscle spasms, Impaired flexibility, Impaired sensation, Improper body mechanics, Postural dysfunction, Obesity, Pain  Visit Diagnosis: Chronic bilateral low back pain without sciatica - Plan: PT plan of care cert/re-cert  Muscle weakness (generalized) - Plan: PT plan of care cert/re-cert  Other symptoms and signs involving the musculoskeletal system - Plan: PT plan of care cert/re-cert  Difficulty in walking, not elsewhere classified - Plan: PT plan of care cert/re-cert  Abnormal posture - Plan: PT plan of care cert/re-cert      G-Codes - 57/84/69 1612    Functional Assessment Tool Used (Outpatient Only) clinical judgement, FOTO, 5xSTS   Functional Limitation Mobility: Walking and moving around   Mobility: Walking and Moving Around Current Status (G2952) At least 60 percent but  less than 80 percent impaired, limited or restricted   Mobility: Walking and Moving Around Goal Status (W4132) At least 20 percent but less than 40 percent impaired, limited or restricted       Problem List Patient Active Problem List   Diagnosis Date Noted  . S/P CABG x 3 06/17/2016  . Family history of early CAD 06/04/2016  . Chest pain 06/04/2016  . Abnormal nuclear stress test 06/04/2016  . Degenerative joint disease (DJD) of lumbar spine 03/24/2016  . Depression (emotion) 03/24/2016  . Obesity 08/14/2015  . Essential hypertension, benign 04/29/2015  . Hyperlipidemia 04/29/2015      Geraldine Solar PT, DPT    [facility] [address] [place], [place], [address] Phone: [phone]   Fax:  [phone]  Name: Donna Woodward MRN: [id number] Date of Birth: [birth year]

## 2016-12-02 NOTE — Patient Instructions (Signed)
  Thomas Test Position Hip Flexor Stretch  Sit on the edge of a bed, grab one knee and lie back onto the bed in the position as shown in the picture.    The leg that is hanging over the edge should be the leg you are stretching.  Hold your opposite knee tight toward your chest to stabilize your spine.   Perform 1x/day, 3-5 stretches 30-60 seconds on each leg -- if you feel like it is helping, you can do it more often and for longer.

## 2016-12-03 ENCOUNTER — Encounter (HOSPITAL_COMMUNITY): Payer: Medicare Other

## 2016-12-04 ENCOUNTER — Encounter (HOSPITAL_COMMUNITY): Payer: Self-pay

## 2016-12-04 ENCOUNTER — Ambulatory Visit (HOSPITAL_COMMUNITY): Payer: Medicare Other

## 2016-12-04 DIAGNOSIS — M6281 Muscle weakness (generalized): Secondary | ICD-10-CM | POA: Diagnosis not present

## 2016-12-04 DIAGNOSIS — M545 Low back pain, unspecified: Secondary | ICD-10-CM

## 2016-12-04 DIAGNOSIS — R29898 Other symptoms and signs involving the musculoskeletal system: Secondary | ICD-10-CM

## 2016-12-04 DIAGNOSIS — G8929 Other chronic pain: Secondary | ICD-10-CM | POA: Diagnosis not present

## 2016-12-04 DIAGNOSIS — R293 Abnormal posture: Secondary | ICD-10-CM

## 2016-12-04 DIAGNOSIS — R262 Difficulty in walking, not elsewhere classified: Secondary | ICD-10-CM

## 2016-12-04 NOTE — Therapy (Signed)
[facility] [address] [place], [place], [address] Phone: [phone]   Fax:  [phone]  Physical Therapy Treatment  Patient Details  Name: Donna Woodward MRN: [id number] Date of Birth: 07-Dec-1955 Referring Provider: Raylene Everts, MD  Encounter Date: 12/04/2016      PT End of Session - 12/04/16 1434    Visit Number 2   Number of Visits 9   Date for PT Re-Evaluation 12/30/16   Authorization Type Medicare Part A and B   Authorization Time Period 12/02/16 to 12/30/16   Authorization - Visit Number 2   Authorization - Number of Visits 10   PT Start Time 6761   PT Stop Time 1518   PT Time Calculation (min) 43 min   Activity Tolerance Patient tolerated treatment well;No increased pain   Behavior During Therapy WFL for tasks assessed/performed      Past Medical History:  Diagnosis Date  . Anxiety   . Back pain   . Coronary artery disease   . Degenerative joint disease (DJD) of lumbar spine 03/24/2016  . Depression   . GERD (gastroesophageal reflux disease)    if overweight  . Hyperlipidemia   . Hypertension     Past Surgical History:  Procedure Laterality Date  . BACK SURGERY    . CARDIAC CATHETERIZATION N/A 06/05/2016   Procedure: Left Heart Cath and Coronary Angiography;  Surgeon: Belva Crome, MD;  Location: [facility];  Service: Cardiovascular;  Laterality: N/A;  . CORONARY ARTERY BYPASS GRAFT N/A 06/17/2016   Procedure: CORONARY ARTERY BYPASS GRAFTING (CABG), ON PUMP, TIMES THREE, USING LEFT INTERNAL MAMMARY ARTERY, RIGHT GREATER SAPHENOUS VEIN HARVESTED ENDOSCOPICALLY;  Surgeon: Ivin Poot, MD;  Location: [facility];  Service: Open Heart Surgery;  Laterality: N/A;  . MULTIPLE TOOTH EXTRACTIONS    . [place]   lamnectomy L5S1  . TEE WITHOUT CARDIOVERSION N/A 06/17/2016   Procedure: TRANSESOPHAGEAL ECHOCARDIOGRAM (TEE);  Surgeon: Ivin Poot, MD;  Location: [facility];  Service: Open Heart Surgery;   Laterality: N/A;    There were no vitals filed for this visit.      Subjective Assessment - 12/04/16 1438    Subjective Pt states "I'm still upright" when asked how she's doing. She states her LBP is the same, and that she had increased soreness following last treatment session.   Pertinent History chronic LBP, CABG x 3 on 06/17/16,    Limitations Lifting;Walking;House hold activities   How long can you sit comfortably? no issues when in her recliner at home, otherwise 30 mins   How long can you stand comfortably? less than 5 mins   How long can you walk comfortably? less than 10 mins   Patient Stated Goals lower pain level,    Currently in Pain? Yes   Pain Score 8    Pain Location Back   Pain Orientation Lower;Left;Right   Pain Descriptors / Indicators Aching;Dull   Pain Type Chronic pain   Pain Onset More than a month ago   Pain Frequency Constant   Aggravating Factors  walking, standing, getting out of bed   Pain Relieving Factors nothing   Effect of Pain on Daily Activities increases            [facility] PT Assessment - 12/04/16 0001      Special Tests    Special Tests Sacrolliac Tests;Hip Special Tests   Sacroiliac Tests  Sacral Thrust   Hip Special Tests  Hip Scouring;Other     Pelvic Dictraction   Findings Negative     Pelvic Compression   Findings Positive   Side Right   comment R hip compression testing on R + for symptoms     Sacral thrust    Findings Positive     Gaenslen's test   Findings Positive   Side  Right     Sacral Compression   Findings Negative   Side  Right     Hip Scouring   Findings Positive   Side Right   Comments R scour recreated LBP and LLE radicular symptoms; L negative     other   Findings Negative   Side Right   Comments Anterior and Posterior labral testing (Fitzgerald's test) on RLE -- pt had pain with testing but feel this was due to pain caused with SI cluster testing that had just been done)     Standardized Balance  Assessment   Standardized Balance Assessment Timed Up and Go Test     Timed Up and Go Test   Normal TUG (seconds) 20.37  SPC               [facility] Adult PT Treatment/Exercise - 12/04/16 0001      Exercises   Exercises Lumbar     Lumbar Exercises: Stretches   Single Knee to Chest Stretch 2 reps;60 seconds   Single Knee to Chest Stretch Limitations BLE, decreased radicular symptoms and LBP     Manual Therapy   Manual Therapy Soft tissue mobilization;Joint mobilization   Manual therapy comments completed separate rest of treatment   Joint Mobilization light Grade I joint mobs at L3 x60 sec and at L4 x 30 sec to assess radicular symptoms; pt had increased LLE radicular symptoms with prolonged CPAs; manual R hip distraction with strap 5 bouts x 30 sec each   Soft tissue mobilization light efflurage and IASTM with green ball to bil proximal glutes, R>L                PT Education - 12/04/16 1539    Education provided Yes   Education Details add SKTC to HEP, will look further into SI joint next session   Person(s) Educated Patient   Methods Explanation;Demonstration   Comprehension Verbalized understanding;Returned demonstration          PT Short Term Goals - 12/02/16 1606      PT SHORT TERM GOAL #1   Title Pt will be independent with HEP and perform consistently to maximize return to PLOF.   Time 2   Period Weeks   Status New     PT SHORT TERM GOAL #2   Title Pt will have improved MMT to 4+/5 throughout BLE to demosntrate improved overall strength to maximize function and gait.   Time 2   Period Weeks   Status New     PT SHORT TERM GOAL #3   Title Pt will report being able to wash dishes for 30 mins or > to demonstrate improved tolerance to standing in order maximize participation at home.   Time 2   Period Weeks   Status New           PT Long Term Goals - 12/02/16 1607      PT LONG TERM GOAL #1   Title Pt will have improved 5xSTS to 15 sec or <  to demonstrate improved overall BLE strength and power in order to maximize functional tasks at home and in the  community.   Time 4   Period Weeks   Status New     PT LONG TERM GOAL #2   Title Pt will have improved lumbar extension ROM by 50% (improve to moderate limitations or better) with minimal to no LBP and no reports of radicular pain to demonstrate improved function and decrease pain overall.    Baseline maximum limitation for extension   Time 4   Period Weeks   Status New     PT LONG TERM GOAL #3   Title Pt will have improved TUG to 10 sec or < with LRAD in order to demonstrate improved overall function and maximzie participation at home and in the community.    Time 4   Period Weeks   Status New               Plan - 12/04/16 1551    Clinical Impression Statement Reviewed pt's goals from initial evaluation and pt did not have any follow-up questions. Continued with objective testing at beginning of session due to limited evaluation. Pt with + R hip scour, stating it recreated her L LBP and LLE radicular symptoms. She reported that manual hip distraction felt really good and reduced her LBP. SI cluster testing positive as she had 3/5 tests reproduce her pain. Sacral thrust, R gaenslen, and R hip compression all recreated her LBP and radicular symptoms. Performed labral special tests but feel these results were skewed due to increased pain following SI cluster testing. Performed light efflurage and light IASTM with green ball to bil glutes, R>L, which reduced her pain some. Assessed CPAs this date and pt reported worsening radicular symptoms and LBP. Had pt then perform [facility] which she stated reduced radicular symptoms and LBP. Appears pt has flexion preference since [facility] reduced her symptoms. Will further assess SI joint next session to determine involvement. Educated pt to begin performing [place] as part of HEP.   Rehab Potential Fair   PT Frequency 2x / week   PT Duration 4 weeks    PT Treatment/Interventions ADLs/Self Care Home Management;DME Instruction;Gait training;Stair training;Functional mobility training;Therapeutic activities;Therapeutic exercise;Balance training;Neuromuscular re-education;Patient/family education;Manual techniques;Passive range of motion;Energy conservation;Taping   PT Next Visit Plan further assess SI joint as SI cluster testing positive; continue SKTC and quad stretching, initiate HS stretching, piriformis stretching, bridging, clamshells; continue manual to lumbar paraspinals and glutes and R manual hip distraction   PT Home Exercise Plan eval: supine quad stretch; SKTC   Consulted and Agree with Plan of Care Patient      Patient will benefit from skilled therapeutic intervention in order to improve the following deficits and impairments:  Abnormal gait, Cardiopulmonary status limiting activity, Decreased activity tolerance, Decreased balance, Decreased endurance, Decreased mobility, Decreased range of motion, Decreased strength, Difficulty walking, Hypomobility, Increased muscle spasms, Impaired flexibility, Impaired sensation, Improper body mechanics, Postural dysfunction, Obesity, Pain  Visit Diagnosis: Chronic bilateral low back pain without sciatica  Muscle weakness (generalized)  Other symptoms and signs involving the musculoskeletal system  Difficulty in walking, not elsewhere classified  Abnormal posture     Problem List Patient Active Problem List   Diagnosis Date Noted  . S/P CABG x 3 06/17/2016  . Family history of early CAD 06/04/2016  . Chest pain 06/04/2016  . Abnormal nuclear stress test 06/04/2016  . Degenerative joint disease (DJD) of lumbar spine 03/24/2016  . Depression (emotion) 03/24/2016  . Obesity 08/14/2015  . Essential hypertension, benign 04/29/2015  . Hyperlipidemia 04/29/2015     Jerene Pitch  [facility] [address] [place], [place],  [address] Phone: [phone]   Fax:  [phone]  Name: MIYANA MORDECAI MRN: [id number] Date of Birth: May 09, 1956

## 2016-12-05 ENCOUNTER — Encounter (HOSPITAL_COMMUNITY): Payer: Medicare Other

## 2016-12-08 ENCOUNTER — Encounter (HOSPITAL_COMMUNITY): Payer: Medicare Other

## 2016-12-09 ENCOUNTER — Ambulatory Visit (HOSPITAL_COMMUNITY): Payer: Medicare Other

## 2016-12-09 DIAGNOSIS — M545 Low back pain, unspecified: Secondary | ICD-10-CM

## 2016-12-09 DIAGNOSIS — R29898 Other symptoms and signs involving the musculoskeletal system: Secondary | ICD-10-CM

## 2016-12-09 DIAGNOSIS — M6281 Muscle weakness (generalized): Secondary | ICD-10-CM

## 2016-12-09 DIAGNOSIS — G8929 Other chronic pain: Secondary | ICD-10-CM | POA: Diagnosis not present

## 2016-12-09 DIAGNOSIS — R262 Difficulty in walking, not elsewhere classified: Secondary | ICD-10-CM

## 2016-12-09 DIAGNOSIS — R293 Abnormal posture: Secondary | ICD-10-CM

## 2016-12-09 NOTE — Therapy (Signed)
[facility] [place], [place], [address] Phone: [phone]   Fax:  [phone]  Physical Therapy Treatment  Patient Details  Name: Donna Woodward MRN: [id number] Date of Birth: 01/10/1956 Referring Provider: Raylene Everts, MD  Encounter Date: 12/09/2016      PT End of Session - 12/09/16 1354    Visit Number 3   Number of Visits 9   Date for PT Re-Evaluation 12/30/16   Authorization Type Medicare Part A and B   Authorization Time Period 12/02/16 to 12/30/16   Authorization - Visit Number 3   Authorization - Number of Visits 10   PT Start Time 8938   PT Stop Time 1428   PT Time Calculation (min) 40 min   Activity Tolerance Patient limited by pain;Patient tolerated treatment well;No increased pain  pain scale range 8-9/10 thru session, reports reduced radicular symptom following MET   Behavior During Therapy [facility] for tasks assessed/performed      Past Medical History:  Diagnosis Date  . Anxiety   . Back pain   . Coronary artery disease   . Degenerative joint disease (DJD) of lumbar spine 03/24/2016  . Depression   . GERD (gastroesophageal reflux disease)    if overweight  . Hyperlipidemia   . Hypertension     Past Surgical History:  Procedure Laterality Date  . BACK SURGERY    . CARDIAC CATHETERIZATION N/A 06/05/2016   Procedure: Left Heart Cath and Coronary Angiography;  Surgeon: Belva Crome, MD;  Location: [facility];  Service: Cardiovascular;  Laterality: N/A;  . CORONARY ARTERY BYPASS GRAFT N/A 06/17/2016   Procedure: CORONARY ARTERY BYPASS GRAFTING (CABG), ON PUMP, TIMES THREE, USING LEFT INTERNAL MAMMARY ARTERY, RIGHT GREATER SAPHENOUS VEIN HARVESTED ENDOSCOPICALLY;  Surgeon: Ivin Poot, MD;  Location: [place];  Service: Open Heart Surgery;  Laterality: N/A;  . MULTIPLE TOOTH EXTRACTIONS    . [place]   lamnectomy L5S1  . TEE WITHOUT CARDIOVERSION N/A 06/17/2016   Procedure:  TRANSESOPHAGEAL ECHOCARDIOGRAM (TEE);  Surgeon: Ivin Poot, MD;  Location: [facility];  Service: Open Heart Surgery;  Laterality: N/A;    There were no vitals filed for this visit.      Subjective Assessment - 12/09/16 1351    Subjective Pt reports pain constant pain on lower back with increased pain with certain movements, pain scale 9/10 across lower back and anterior Lt thigh.   Pertinent History chronic LBP, CABG x 3 on 06/17/16,    Patient Stated Goals lower pain level,    Currently in Pain? Yes   Pain Score 9    Pain Location Back  lower back and Lt anterior thigh   Pain Orientation Lower;Left;Anterior   Pain Type Chronic pain   Pain Onset More than a month ago   Pain Frequency Constant   Aggravating Factors  walking, standing, getting out of bed   Pain Relieving Factors nothing   Effect of Pain on Daily Activities increases                         [facility] Adult PT Treatment/Exercise - 12/09/16 0001      Lumbar Exercises: Aerobic   Tread Mill Gait training 1.0 mph incline 3 x 3 min following MET     Lumbar Exercises: Supine   Ab Set 10 reps;3 seconds   AB Set Limitations following MET   Bridge 10 reps;3 seconds  Bridge Limitations Lt foot closer following MET   Straight Leg Raise 5 reps   Straight Leg Raises Limitations Rt only     Manual Therapy   Manual Therapy Muscle Energy Technique   Manual therapy comments completed separate rest of treatment   Muscle Energy Technique MET for Lt SI anterior rotation f/b core and proximal strengthening and gait training                  PT Short Term Goals - 12/02/16 1606      PT SHORT TERM GOAL #1   Title Pt will be independent with HEP and perform consistently to maximize return to PLOF.   Time 2   Period Weeks   Status New     PT SHORT TERM GOAL #2   Title Pt will have improved MMT to 4+/5 throughout BLE to demosntrate improved overall strength to maximize function and gait.   Time 2    Period Weeks   Status New     PT SHORT TERM GOAL #3   Title Pt will report being able to wash dishes for 30 mins or > to demonstrate improved tolerance to standing in order maximize participation at home.   Time 2   Period Weeks   Status New           PT Long Term Goals - 12/02/16 1607      PT LONG TERM GOAL #1   Title Pt will have improved 5xSTS to 15 sec or < to demonstrate improved overall BLE strength and power in order to maximize functional tasks at home and in the community.   Time 4   Period Weeks   Status New     PT LONG TERM GOAL #2   Title Pt will have improved lumbar extension ROM by 50% (improve to moderate limitations or better) with minimal to no LBP and no reports of radicular pain to demonstrate improved function and decrease pain overall.    Baseline maximum limitation for extension   Time 4   Period Weeks   Status New     PT LONG TERM GOAL #3   Title Pt will have improved TUG to 10 sec or < with LRAD in order to demonstrate improved overall function and maximzie participation at home and in the community.    Time 4   Period Weeks   Status New               Plan - 12/09/16 1733    Clinical Impression Statement Session focus on improving SI alignment for pain control.  Findings include Lt SI anterior rotation, LLD and increased pain with Lt SI and reports of radicular symptoms Lt anterior thigh.  Muscle energy technqiue complete for Lt SI anterior rotation with reports of radicular symptoms reduced following manual.  Therex focus on core/proximal strengthening to assist with alignment and gait training to improve mechanics.  Pt continues to be limited by pain scale 8-9/10 through session.     Rehab Potential Fair   PT Frequency 2x / week   PT Duration 4 weeks   PT Treatment/Interventions ADLs/Self Care Home Management;DME Instruction;Gait training;Stair training;Functional mobility training;Therapeutic activities;Therapeutic exercise;Balance  training;Neuromuscular re-education;Patient/family education;Manual techniques;Passive range of motion;Energy conservation;Taping   PT Next Visit Plan F/U following MET this session for pain and radicular symptoms.  Check SI alignment PRN.  further assess SI joint as SI cluster testing positive; continue SKTC and quad stretching, initiate HS stretching, piriformis stretching, bridging, clamshells; continue  manual to lumbar paraspinals and glutes and R manual hip distraction      Patient will benefit from skilled therapeutic intervention in order to improve the following deficits and impairments:  Abnormal gait, Cardiopulmonary status limiting activity, Decreased activity tolerance, Decreased balance, Decreased endurance, Decreased mobility, Decreased range of motion, Decreased strength, Difficulty walking, Hypomobility, Increased muscle spasms, Impaired flexibility, Impaired sensation, Improper body mechanics, Postural dysfunction, Obesity, Pain  Visit Diagnosis: Chronic bilateral low back pain without sciatica  Muscle weakness (generalized)  Other symptoms and signs involving the musculoskeletal system  Difficulty in walking, not elsewhere classified  Abnormal posture     Problem List Patient Active Problem List   Diagnosis Date Noted  . S/P CABG x 3 06/17/2016  . Family history of early CAD 06/04/2016  . Chest pain 06/04/2016  . Abnormal nuclear stress test 06/04/2016  . Degenerative joint disease (DJD) of lumbar spine 03/24/2016  . Depression (emotion) 03/24/2016  . Obesity 08/14/2015  . Essential hypertension, benign 04/29/2015  . Hyperlipidemia 04/29/2015   Ihor Austin, [place]; [place]  Aldona Lento 12/09/2016, [place], [place], [address] Phone: [phone]   Fax:  [phone]  Name: ABRIANA SALTOS MRN: [id number] Date of Birth: 08-25-1955

## 2016-12-10 ENCOUNTER — Encounter (HOSPITAL_COMMUNITY): Payer: Medicare Other

## 2016-12-11 ENCOUNTER — Ambulatory Visit (HOSPITAL_COMMUNITY): Payer: Medicare Other

## 2016-12-11 DIAGNOSIS — R293 Abnormal posture: Secondary | ICD-10-CM

## 2016-12-11 DIAGNOSIS — M545 Low back pain, unspecified: Secondary | ICD-10-CM

## 2016-12-11 DIAGNOSIS — R262 Difficulty in walking, not elsewhere classified: Secondary | ICD-10-CM

## 2016-12-11 DIAGNOSIS — R29898 Other symptoms and signs involving the musculoskeletal system: Secondary | ICD-10-CM

## 2016-12-11 DIAGNOSIS — G8929 Other chronic pain: Secondary | ICD-10-CM

## 2016-12-11 DIAGNOSIS — M6281 Muscle weakness (generalized): Secondary | ICD-10-CM | POA: Diagnosis not present

## 2016-12-11 NOTE — Patient Instructions (Signed)
Flexors, Standing    Stand, with left foot on stool, right foot on floor. Use support for balance, if needed. Slowly shift weight toward raised knee until stretch is felt in quadriceps of standing leg and hamstrings of forward leg. Hold 30 seconds.  Repeat 3 times per session. Do 2 sessions per day.  Copyright  VHI. All rights reserved.   Hamstring Stretch    With other leg bent, foot flat, grasp right leg and slowly try to straighten knee. Hold 30 seconds. Repeat 3 times. Do 1-2 sessions per day.  http://gt2.exer.us/280   Copyright  VHI. All rights reserved.    Posterior tilt  Laying on back or standing tighten stomach muscle tilting pelvic back.  Hold for 5-10" and complete 10 reps 3 times a day.

## 2016-12-11 NOTE — Therapy (Signed)
[facility] [address] [place], [place], [address] Phone: [phone]   Fax:  [phone]  Physical Therapy Treatment  Patient Details  Name: Donna Woodward MRN: [id number] Date of Birth: Dec 29, 1955 Referring Provider: Raylene Everts, MD  Encounter Date: 12/11/2016      PT End of Session - 12/11/16 1314    Visit Number 4   Number of Visits 9   Date for PT Re-Evaluation 12/30/16   Authorization Type Medicare Part A and B   Authorization Time Period 12/02/16 to 12/30/16   Authorization - Visit Number 4   Authorization - Number of Visits 10   PT Start Time 4503  pt late for apt   PT Stop Time 1346   PT Time Calculation (min) 35 min   Activity Tolerance Patient limited by pain;Patient tolerated treatment well;No increased pain  Pain scale 8/10 beginning and EOS, no radicular symptoms at EOS   Behavior During Therapy [facility] for tasks assessed/performed      Past Medical History:  Diagnosis Date  . Anxiety   . Back pain   . Coronary artery disease   . Degenerative joint disease (DJD) of lumbar spine 03/24/2016  . Depression   . GERD (gastroesophageal reflux disease)    if overweight  . Hyperlipidemia   . Hypertension     Past Surgical History:  Procedure Laterality Date  . BACK SURGERY    . CARDIAC CATHETERIZATION N/A 06/05/2016   Procedure: Left Heart Cath and Coronary Angiography;  Surgeon: Belva Crome, MD;  Location: [facility];  Service: Cardiovascular;  Laterality: N/A;  . CORONARY ARTERY BYPASS GRAFT N/A 06/17/2016   Procedure: CORONARY ARTERY BYPASS GRAFTING (CABG), ON PUMP, TIMES THREE, USING LEFT INTERNAL MAMMARY ARTERY, RIGHT GREATER SAPHENOUS VEIN HARVESTED ENDOSCOPICALLY;  Surgeon: Ivin Poot, MD;  Location: [place];  Service: Open Heart Surgery;  Laterality: N/A;  . MULTIPLE TOOTH EXTRACTIONS    . [place]   lamnectomy L5S1  . TEE WITHOUT CARDIOVERSION N/A 06/17/2016   Procedure:  TRANSESOPHAGEAL ECHOCARDIOGRAM (TEE);  Surgeon: Ivin Poot, MD;  Location: [facility];  Service: Open Heart Surgery;  Laterality: N/A;    There were no vitals filed for this visit.      Subjective Assessment - 12/11/16 1312    Subjective Pt stated she continues to have high pain scale lower back Lt side and anterior Lt thigh radicular symptoms, pain scale 8/10.  Reports complaince iwth HEP daily.     Pertinent History chronic LBP, CABG x 3 on 06/17/16,    Patient Stated Goals lower pain level,    Currently in Pain? Yes   Pain Score 8    Pain Location Back  Lt anterior thigh   Pain Orientation Lower;Left;Anterior   Pain Type Chronic pain   Pain Onset More than a month ago   Pain Frequency Constant   Aggravating Factors  walking, standing, getting out of bed   Pain Relieving Factors nothing   Effect of Pain on Daily Activities increases                         [facility] Adult PT Treatment/Exercise - 12/11/16 0001      Lumbar Exercises: Stretches   Active Hamstring Stretch 2 reps;30 seconds   Active Hamstring Stretch Limitations supine    Pelvic Tilt 2 reps   Pelvic Tilt Limitations 2 sets x 10 reps   Sports administrator  2 reps;30 seconds   Quad Stretch Limitations standing hip flexor st     Lumbar Exercises: Standing   Other Standing Lumbar Exercises posterior pelvic tilt 2 sets 10 reps     Lumbar Exercises: Supine   Ab Set 20 reps;3 seconds   AB Set Limitations 2 sets posterior pelvic tilt 5" holds   Bridge 10 reps     Manual Therapy   Manual Therapy Muscle Energy Technique   Manual therapy comments completed separate rest of treatment   Muscle Energy Technique Pubic clearing prior core strengthening and hip flexor stretch                  PT Short Term Goals - 12/02/16 1606      PT SHORT TERM GOAL #1   Title Pt will be independent with HEP and perform consistently to maximize return to PLOF.   Time 2   Period Weeks   Status New     PT SHORT  TERM GOAL #2   Title Pt will have improved MMT to 4+/5 throughout BLE to demosntrate improved overall strength to maximize function and gait.   Time 2   Period Weeks   Status New     PT SHORT TERM GOAL #3   Title Pt will report being able to wash dishes for 30 mins or > to demonstrate improved tolerance to standing in order maximize participation at home.   Time 2   Period Weeks   Status New           PT Long Term Goals - 12/02/16 1607      PT LONG TERM GOAL #1   Title Pt will have improved 5xSTS to 15 sec or < to demonstrate improved overall BLE strength and power in order to maximize functional tasks at home and in the community.   Time 4   Period Weeks   Status New     PT LONG TERM GOAL #2   Title Pt will have improved lumbar extension ROM by 50% (improve to moderate limitations or better) with minimal to no LBP and no reports of radicular pain to demonstrate improved function and decrease pain overall.    Baseline maximum limitation for extension   Time 4   Period Weeks   Status New     PT LONG TERM GOAL #3   Title Pt will have improved TUG to 10 sec or < with LRAD in order to demonstrate improved overall function and maximzie participation at home and in the community.    Time 4   Period Weeks   Status New               Plan - 12/11/16 1523    Clinical Impression Statement Continued session foucs on improving SI alingment for pain control.  Findings include improving alignment with minimal shifting of SI joints and no LLD this session.  Manual pubic clearing complete to improve stability wtih SI alignment and therex focus on improving core strengthening to assist with alignment.  Pt presents with forward flexed hip, forward rolled shoulders and ambulates while looking at floor.  Pt educated on importance of proper posture to assist with pain control.  Added standing hip flexor and hamstring stretches and given HEP to begin at home to improve LE flexibilty.  Pt  reports no radicular symptoms with posterior pelvic tilt.  Back pain continues to have high pain scale at 8/10 at EOS, no radicular symptoms.     Rehab Potential  Fair   PT Frequency 2x / week   PT Duration 4 weeks   PT Treatment/Interventions ADLs/Self Care Home Management;DME Instruction;Gait training;Stair training;Functional mobility training;Therapeutic activities;Therapeutic exercise;Balance training;Neuromuscular re-education;Patient/family education;Manual techniques;Passive range of motion;Energy conservation;Taping   PT Next Visit Plan MET for SI alignment.  Continue wiht hip flexor and hamstring stretches and proximal strengthening.  Next session add seated posture strengthening (cervical and scapular retraction) and encourage proper posture with standing.     PT Home Exercise Plan eval: supine quad stretch; SKTC; 6/14: Pelvic tilt, hamstring and standing hip flexor stretch for Lt LE      Patient will benefit from skilled therapeutic intervention in order to improve the following deficits and impairments:  Abnormal gait, Cardiopulmonary status limiting activity, Decreased activity tolerance, Decreased balance, Decreased endurance, Decreased mobility, Decreased range of motion, Decreased strength, Difficulty walking, Hypomobility, Increased muscle spasms, Impaired flexibility, Impaired sensation, Improper body mechanics, Postural dysfunction, Obesity, Pain  Visit Diagnosis: Chronic bilateral low back pain without sciatica  Muscle weakness (generalized)  Other symptoms and signs involving the musculoskeletal system  Difficulty in walking, not elsewhere classified  Abnormal posture     Problem List Patient Active Problem List   Diagnosis Date Noted  . S/P CABG x 3 06/17/2016  . Family history of early CAD 06/04/2016  . Chest pain 06/04/2016  . Abnormal nuclear stress test 06/04/2016  . Degenerative joint disease (DJD) of lumbar spine 03/24/2016  . Depression (emotion)  03/24/2016  . Obesity 08/14/2015  . Essential hypertension, benign 04/29/2015  . Hyperlipidemia 04/29/2015   Donna Woodward, [place]; [place]  Aldona Lento 12/11/2016, 3:45 PM  [facility] [address] [place], [place], [address] Phone: [phone]   Fax:  [phone]  Name: Donna Woodward MRN: [id number] Date of Birth: 22-May-1956

## 2016-12-12 ENCOUNTER — Encounter (HOSPITAL_COMMUNITY): Payer: Medicare Other

## 2016-12-16 ENCOUNTER — Ambulatory Visit (HOSPITAL_COMMUNITY): Payer: Medicare Other

## 2016-12-16 ENCOUNTER — Encounter (HOSPITAL_COMMUNITY): Payer: Self-pay

## 2016-12-16 DIAGNOSIS — R29898 Other symptoms and signs involving the musculoskeletal system: Secondary | ICD-10-CM

## 2016-12-16 DIAGNOSIS — R262 Difficulty in walking, not elsewhere classified: Secondary | ICD-10-CM

## 2016-12-16 DIAGNOSIS — M6281 Muscle weakness (generalized): Secondary | ICD-10-CM | POA: Diagnosis not present

## 2016-12-16 DIAGNOSIS — R293 Abnormal posture: Secondary | ICD-10-CM

## 2016-12-16 DIAGNOSIS — G8929 Other chronic pain: Secondary | ICD-10-CM | POA: Diagnosis not present

## 2016-12-16 DIAGNOSIS — M545 Low back pain: Secondary | ICD-10-CM | POA: Diagnosis not present

## 2016-12-16 NOTE — Therapy (Signed)
[place], [place], [address] Phone: [phone]   Fax:  [phone]  Physical Therapy Treatment  Patient Details  Name: Donna Woodward MRN: [id number] Date of Birth: 1955/08/14 Referring Provider: Raylene Everts, MD  Encounter Date: 12/16/2016      PT End of Session - 12/16/16 1305    Visit Number 5   Number of Visits 9   Date for PT Re-Evaluation 12/30/16   Authorization Type Medicare Part A and B   Authorization Time Period 12/02/16 to 12/30/16   Authorization - Visit Number 5   Authorization - Number of Visits 10   PT Start Time 1301   PT Stop Time 1342   PT Time Calculation (min) 41 min   Activity Tolerance Patient limited by pain;Patient tolerated treatment well;No increased pain  Pain scale 8/10 beginning and EOS, no radicular symptoms at EOS   Behavior During Therapy [facility] for tasks assessed/performed      Past Medical History:  Diagnosis Date  . Anxiety   . Back pain   . Coronary artery disease   . Degenerative joint disease (DJD) of lumbar spine 03/24/2016  . Depression   . GERD (gastroesophageal reflux disease)    if overweight  . Hyperlipidemia   . Hypertension     Past Surgical History:  Procedure Laterality Date  . BACK SURGERY    . CARDIAC CATHETERIZATION N/A 06/05/2016   Procedure: Left Heart Cath and Coronary Angiography;  Surgeon: Belva Crome, MD;  Location: [place] [facility];  Service: Cardiovascular;  Laterality: N/A;  . CORONARY ARTERY BYPASS GRAFT N/A 06/17/2016   Procedure: CORONARY ARTERY BYPASS GRAFTING (CABG), ON PUMP, TIMES THREE, USING LEFT INTERNAL MAMMARY ARTERY, RIGHT GREATER SAPHENOUS VEIN HARVESTED ENDOSCOPICALLY;  Surgeon: Ivin Poot, MD;  Location: [place];  Service: Open Heart Surgery;  Laterality: N/A;  . MULTIPLE TOOTH EXTRACTIONS    . [place]   lamnectomy L5S1  . TEE WITHOUT CARDIOVERSION N/A 06/17/2016   Procedure: TRANSESOPHAGEAL  ECHOCARDIOGRAM (TEE);  Surgeon: Ivin Poot, MD;  Location: [place];  Service: Open Heart Surgery;  Laterality: N/A;    There were no vitals filed for this visit.      Subjective Assessment - 12/16/16 1303    Subjective Pt states that she is still having her LBP with radicular symptoms down her LLE. She tried her exercises over the weekend and some she was not able to do because of too much pain.    Pertinent History chronic LBP, CABG x 3 on 06/17/16,    Limitations Lifting;Walking;House hold activities   How long can you sit comfortably? no issues when in her recliner at home, otherwise 30 mins   How long can you stand comfortably? less than 5 mins   How long can you walk comfortably? less than 10 mins   Patient Stated Goals lower pain level,    Currently in Pain? Yes   Pain Score 9    Pain Location Back  L anterior thigh   Pain Orientation Lower;Left   Pain Descriptors / Indicators Sharp;Aching   Pain Onset More than a month ago   Pain Frequency Constant   Aggravating Factors  walking, standing, getting out of bed   Pain Relieving Factors nothing   Effect of Pain on Daily Activities increases              [facility] Adult PT Treatment/Exercise - 12/16/16 0001  Lumbar Exercises: Stretches   Active Hamstring Stretch 2 reps;30 seconds   Active Hamstring Stretch Limitations supine    Pelvic Tilt Limitations 2 sets x 10 reps, holding for 2-3 sec each     Lumbar Exercises: Seated   LAQ on Chair Limitations sitting posture with lumbar roll x 2 mins   Hip Flexion on Ball Limitations seated sap retraction with lumbar roll x 10 reps with RTB     Lumbar Exercises: Supine   Bent Knee Raise 10 reps   Bent Knee Raise Limitations 2 sets with posterior pelvic tilt   Bridge 10 reps   Bridge Limitations with diaphragmatic breathing to engage core     Manual Therapy   Manual Therapy Muscle Energy Technique;Myofascial release   Manual therapy comments completed separate rest of  treatment   Myofascial Release MFR to L hip flexor x 8 mins total   Muscle Energy Technique Pubic clearing prior core strengthening and hip flexor stretch             PT Short Term Goals - 12/02/16 1606      PT SHORT TERM GOAL #1   Title Pt will be independent with HEP and perform consistently to maximize return to PLOF.   Time 2   Period Weeks   Status New     PT SHORT TERM GOAL #2   Title Pt will have improved MMT to 4+/5 throughout BLE to demosntrate improved overall strength to maximize function and gait.   Time 2   Period Weeks   Status New     PT SHORT TERM GOAL #3   Title Pt will report being able to wash dishes for 30 mins or > to demonstrate improved tolerance to standing in order maximize participation at home.   Time 2   Period Weeks   Status New           PT Long Term Goals - 12/02/16 1607      PT LONG TERM GOAL #1   Title Pt will have improved 5xSTS to 15 sec or < to demonstrate improved overall BLE strength and power in order to maximize functional tasks at home and in the community.   Time 4   Period Weeks   Status New     PT LONG TERM GOAL #2   Title Pt will have improved lumbar extension ROM by 50% (improve to moderate limitations or better) with minimal to no LBP and no reports of radicular pain to demonstrate improved function and decrease pain overall.    Baseline maximum limitation for extension   Time 4   Period Weeks   Status New     PT LONG TERM GOAL #3   Title Pt will have improved TUG to 10 sec or < with LRAD in order to demonstrate improved overall function and maximzie participation at home and in the community.    Time 4   Period Weeks   Status New               Plan - 12/16/16 1343    Clinical Impression Statement Began session with pubic clearing MET as pt's L innominant was still rotated anteriorly. Followed up with core stabilization exercises and stretching. Pt had increased soft tissue restrictions of L hip flexor  so PT performed MFR to address this. Pt reported decreased symptoms following. Educated pt on proper sitting posture with lumbar roll and encouraged pt to sit like this for at least 5 mins each day until  her next appointment. pt verbalized understanding. Continue to address SI alignment while progressing core stabilization and strength as tolerated.   Rehab Potential Fair   PT Frequency 2x / week   PT Duration 4 weeks   PT Treatment/Interventions ADLs/Self Care Home Management;DME Instruction;Gait training;Stair training;Functional mobility training;Therapeutic activities;Therapeutic exercise;Balance training;Neuromuscular re-education;Patient/family education;Manual techniques;Passive range of motion;Energy conservation;Taping   PT Next Visit Plan MET for SI alignment.  Continue wiht hip flexor and hamstring stretches and proximal strengthening.  continue MFR to L hip flexor; continue seated posture strengthening (add cervical retraction) and encourage proper posture with standing; continue quad stretching, and encourage proper posture in standing/when walking   PT Home Exercise Plan eval: supine quad stretch; SKTC; 6/14: Pelvic tilt, hamstring and standing hip flexor stretch for Lt LE   Consulted and Agree with Plan of Care Patient      Patient will benefit from skilled therapeutic intervention in order to improve the following deficits and impairments:  Abnormal gait, Cardiopulmonary status limiting activity, Decreased activity tolerance, Decreased balance, Decreased endurance, Decreased mobility, Decreased range of motion, Decreased strength, Difficulty walking, Hypomobility, Increased muscle spasms, Impaired flexibility, Impaired sensation, Improper body mechanics, Postural dysfunction, Obesity, Pain  Visit Diagnosis: Chronic bilateral low back pain without sciatica  Muscle weakness (generalized)  Other symptoms and signs involving the musculoskeletal system  Difficulty in walking, not  elsewhere classified  Abnormal posture     Problem List Patient Active Problem List   Diagnosis Date Noted  . S/P CABG x 3 06/17/2016  . Family history of early CAD 06/04/2016  . Chest pain 06/04/2016  . Abnormal nuclear stress test 06/04/2016  . Degenerative joint disease (DJD) of lumbar spine 03/24/2016  . Depression (emotion) 03/24/2016  . Obesity 08/14/2015  . Essential hypertension, benign 04/29/2015  . Hyperlipidemia 04/29/2015     Geraldine Solar PT, DPT   [facility] [address] [place], [place], [address] Phone: [phone]   Fax:  [phone]  Name: Donna Woodward MRN: [id number] Date of Birth: July 13, 1955

## 2016-12-18 ENCOUNTER — Ambulatory Visit (HOSPITAL_COMMUNITY): Payer: Medicare Other | Admitting: Physical Therapy

## 2016-12-18 DIAGNOSIS — R262 Difficulty in walking, not elsewhere classified: Secondary | ICD-10-CM | POA: Diagnosis not present

## 2016-12-18 DIAGNOSIS — R293 Abnormal posture: Secondary | ICD-10-CM

## 2016-12-18 DIAGNOSIS — R29898 Other symptoms and signs involving the musculoskeletal system: Secondary | ICD-10-CM | POA: Diagnosis not present

## 2016-12-18 DIAGNOSIS — G8929 Other chronic pain: Secondary | ICD-10-CM

## 2016-12-18 DIAGNOSIS — M6281 Muscle weakness (generalized): Secondary | ICD-10-CM | POA: Diagnosis not present

## 2016-12-18 DIAGNOSIS — M545 Low back pain: Secondary | ICD-10-CM | POA: Diagnosis not present

## 2016-12-18 NOTE — Therapy (Signed)
[place], [place], [address] Phone: [phone]   Fax:  [phone]  Physical Therapy Treatment  Patient Details  Name: Donna Woodward MRN: [id number] Date of Birth: April 24, 1956 Referring Provider: Raylene Everts, MD  Encounter Date: 12/18/2016      PT End of Session - 12/18/16 1457    Visit Number 6   Number of Visits 9   Date for PT Re-Evaluation 12/30/16   Authorization Type Medicare Part A and B   Authorization Time Period 12/02/16 to 12/30/16   Authorization - Visit Number 6   Authorization - Number of Visits 10   PT Start Time 1350   PT Stop Time 1435   PT Time Calculation (min) 45 min   Activity Tolerance Patient limited by pain;Patient tolerated treatment well;No increased pain  Pain scale 8/10 beginning and EOS, no radicular symptoms at EOS   Behavior During Therapy [facility] for tasks assessed/performed      Past Medical History:  Diagnosis Date  . Anxiety   . Back pain   . Coronary artery disease   . Degenerative joint disease (DJD) of lumbar spine 03/24/2016  . Depression   . GERD (gastroesophageal reflux disease)    if overweight  . Hyperlipidemia   . Hypertension     Past Surgical History:  Procedure Laterality Date  . BACK SURGERY    . CARDIAC CATHETERIZATION N/A 06/05/2016   Procedure: Left Heart Cath and Coronary Angiography;  Surgeon: Belva Crome, MD;  Location: [place] [facility];  Service: Cardiovascular;  Laterality: N/A;  . CORONARY ARTERY BYPASS GRAFT N/A 06/17/2016   Procedure: CORONARY ARTERY BYPASS GRAFTING (CABG), ON PUMP, TIMES THREE, USING LEFT INTERNAL MAMMARY ARTERY, RIGHT GREATER SAPHENOUS VEIN HARVESTED ENDOSCOPICALLY;  Surgeon: Ivin Poot, MD;  Location: [place];  Service: Open Heart Surgery;  Laterality: N/A;  . MULTIPLE TOOTH EXTRACTIONS    . [place]   lamnectomy L5S1  . TEE WITHOUT CARDIOVERSION N/A 06/17/2016   Procedure: TRANSESOPHAGEAL  ECHOCARDIOGRAM (TEE);  Surgeon: Ivin Poot, MD;  Location: [place];  Service: Open Heart Surgery;  Laterality: N/A;    There were no vitals filed for this visit.      Subjective Assessment - 12/18/16 1458    Subjective PT states she continues to have pain across her LB at 8/10.  No radicular symptoms noted today.  Reports some compliance with HEP.                         [facility] Adult PT Treatment/Exercise - 12/18/16 0001      Ambulation/Gait   Gait Comments 226 feet in dept following MET     Lumbar Exercises: Seated   Long Arc Quad on [place] 5 reps   Hip Flexion on Ball 5 reps   Hip Flexion on Ball Limitations seated scap retraction with lumbar roll x 10 reps with RTB     Lumbar Exercises: Supine   Bent Knee Raise 15 reps   Bent Knee Raise Limitations 2 sets with posterior pelvic tilt   Bridge 15 reps   Bridge Limitations with diaphragmatic breathing to engage core     Manual Therapy   Manual Therapy Muscle Energy Technique;Myofascial release   Manual therapy comments completed initiallly and separate rest of treatment   Myofascial Release MFR to L hip flexor x 8 mins total   Muscle Energy Technique MET for Lt SI anterior  rotation  f/b ambulation                  PT Short Term Goals - 12/02/16 1606      PT SHORT TERM GOAL #1   Title Pt will be independent with HEP and perform consistently to maximize return to PLOF.   Time 2   Period Weeks   Status New     PT SHORT TERM GOAL #2   Title Pt will have improved MMT to 4+/5 throughout BLE to demosntrate improved overall strength to maximize function and gait.   Time 2   Period Weeks   Status New     PT SHORT TERM GOAL #3   Title Pt will report being able to wash dishes for 30 mins or > to demonstrate improved tolerance to standing in order maximize participation at home.   Time 2   Period Weeks   Status New           PT Long Term Goals - 12/02/16 1607      PT LONG TERM GOAL #1    Title Pt will have improved 5xSTS to 15 sec or < to demonstrate improved overall BLE strength and power in order to maximize functional tasks at home and in the community.   Time 4   Period Weeks   Status New     PT LONG TERM GOAL #2   Title Pt will have improved lumbar extension ROM by 50% (improve to moderate limitations or better) with minimal to no LBP and no reports of radicular pain to demonstrate improved function and decrease pain overall.    Baseline maximum limitation for extension   Time 4   Period Weeks   Status New     PT LONG TERM GOAL #3   Title Pt will have improved TUG to 10 sec or < with LRAD in order to demonstrate improved overall function and maximzie participation at home and in the community.    Time 4   Period Weeks   Status New               Plan - 12/18/16 1459    Clinical Impression Statement Checked SI with noted anterior rotation of Lt SI.  MET completed followed by ambualtion in gym.  On follow up, SI in good alignment following.  intiated core stab exercises using physioball.  Pt able to complete but with noted instabiliy requiring min assist.  Pt without pain behaviors noted remaining of sessoin and no complaints of pain at end of session.    Rehab Potential Fair   PT Frequency 2x / week   PT Duration 4 weeks   PT Treatment/Interventions ADLs/Self Care Home Management;DME Instruction;Gait training;Stair training;Functional mobility training;Therapeutic activities;Therapeutic exercise;Balance training;Neuromuscular re-education;Patient/family education;Manual techniques;Passive range of motion;Energy conservation;Taping   PT Next Visit Plan check SI alignment and complete MET if warranted.  Continue with hip flexor and hamstring stretches and proximal strengthening.  continue MFR to L hip flexor; continue seated posture strengthening (add cervical retraction) and encourage proper posture with standing; continue quad stretching.  Next session add  postural theraband exercises (Postural 3) in standing.   PT Home Exercise Plan eval: supine quad stretch; SKTC; 6/14: Pelvic tilt, hamstring and standing hip flexor stretch for Lt LE   Consulted and Agree with Plan of Care Patient      Patient will benefit from skilled therapeutic intervention in order to improve the following deficits and impairments:  Abnormal gait, Cardiopulmonary status limiting  activity, Decreased activity tolerance, Decreased balance, Decreased endurance, Decreased mobility, Decreased range of motion, Decreased strength, Difficulty walking, Hypomobility, Increased muscle spasms, Impaired flexibility, Impaired sensation, Improper body mechanics, Postural dysfunction, Obesity, Pain  Visit Diagnosis: Chronic bilateral low back pain without sciatica  Muscle weakness (generalized)  Other symptoms and signs involving the musculoskeletal system  Difficulty in walking, not elsewhere classified  Abnormal posture     Problem List Patient Active Problem List   Diagnosis Date Noted  . S/P CABG x 3 06/17/2016  . Family history of early CAD 06/04/2016  . Chest pain 06/04/2016  . Abnormal nuclear stress test 06/04/2016  . Degenerative joint disease (DJD) of lumbar spine 03/24/2016  . Depression (emotion) 03/24/2016  . Obesity 08/14/2015  . Essential hypertension, benign 04/29/2015  . Hyperlipidemia 04/29/2015    Teena Irani, PTA/CLT [phone]  12/18/2016, 3:04 PM  [facility] [address] [place], [place], [address] Phone: [phone]   Fax:  [phone]  Name: SHAVONNE AMBROISE MRN: [id number] Date of Birth: Dec 19, 1955

## 2016-12-23 ENCOUNTER — Ambulatory Visit (HOSPITAL_COMMUNITY): Payer: Medicare Other | Admitting: Physical Therapy

## 2016-12-23 DIAGNOSIS — R29898 Other symptoms and signs involving the musculoskeletal system: Secondary | ICD-10-CM | POA: Diagnosis not present

## 2016-12-23 DIAGNOSIS — M6281 Muscle weakness (generalized): Secondary | ICD-10-CM | POA: Diagnosis not present

## 2016-12-23 DIAGNOSIS — G8929 Other chronic pain: Secondary | ICD-10-CM | POA: Diagnosis not present

## 2016-12-23 DIAGNOSIS — R262 Difficulty in walking, not elsewhere classified: Secondary | ICD-10-CM | POA: Diagnosis not present

## 2016-12-23 DIAGNOSIS — R293 Abnormal posture: Secondary | ICD-10-CM | POA: Diagnosis not present

## 2016-12-23 DIAGNOSIS — M545 Low back pain: Principal | ICD-10-CM

## 2016-12-23 NOTE — Therapy (Signed)
[facility] [address] [place], [place], [address] Phone: [phone]   Fax:  [phone]  Physical Therapy Treatment  Patient Details  Name: Donna Woodward MRN: [id number] Date of Birth: [birth year] Referring Provider: Raylene Everts, MD  Encounter Date: 12/23/2016      PT End of Session - 12/23/16 1327    Visit Number 7   Number of Visits 9   Date for PT Re-Evaluation 12/30/16   Authorization Type Medicare Part A and B   Authorization Time Period 12/02/16 to 12/30/16   Authorization - Visit Number 7   Authorization - Number of Visits 10   PT Start Time 8850   PT Stop Time 1348   PT Time Calculation (min) 45 min   Activity Tolerance Patient limited by pain;Patient tolerated treatment well;No increased pain  Pain scale 8/10 beginning and EOS, no radicular symptoms at EOS   Behavior During Therapy [facility] for tasks assessed/performed      Past Medical History:  Diagnosis Date  . Anxiety   . Back pain   . Coronary artery disease   . Degenerative joint disease (DJD) of lumbar spine 03/24/2016  . Depression   . GERD (gastroesophageal reflux disease)    if overweight  . Hyperlipidemia   . Hypertension     Past Surgical History:  Procedure Laterality Date  . BACK SURGERY    . CARDIAC CATHETERIZATION N/A 06/05/2016   Procedure: Left Heart Cath and Coronary Angiography;  Surgeon: Belva Crome, MD;  Location: [place] [facility];  Service: Cardiovascular;  Laterality: N/A;  . CORONARY ARTERY BYPASS GRAFT N/A 06/17/2016   Procedure: CORONARY ARTERY BYPASS GRAFTING (CABG), ON PUMP, TIMES THREE, USING LEFT INTERNAL MAMMARY ARTERY, RIGHT GREATER SAPHENOUS VEIN HARVESTED ENDOSCOPICALLY;  Surgeon: Ivin Poot, MD;  Location: [place];  Service: Open Heart Surgery;  Laterality: N/A;  . MULTIPLE TOOTH EXTRACTIONS    . [place]   lamnectomy L5S1  . TEE WITHOUT CARDIOVERSION N/A 06/17/2016   Procedure: TRANSESOPHAGEAL  ECHOCARDIOGRAM (TEE);  Surgeon: Ivin Poot, MD;  Location: [place];  Service: Open Heart Surgery;  Laterality: N/A;    There were no vitals filed for this visit.                       [facility] Adult PT Treatment/Exercise - 12/23/16 0001      Ambulation/Gait   Gait Comments 226 feet in dept following MET     Lumbar Exercises: Stretches   Active Hamstring Stretch 2 reps;30 seconds   Active Hamstring Stretch Limitations supine    Piriformis Stretch 2 reps;30 seconds   Piriformis Stretch Limitations left in seated     Lumbar Exercises: Seated   Long Arc Quad on [place] 10 reps  on dyna disc   Hip Flexion on Ball 10 reps  on disc   Hip Flexion on Ball Limitations seated scap retraction on dyna disc   Sit to Stand 10 reps   Sit to Stand Limitations no UE's     Lumbar Exercises: Supine   Bent Knee Raise 15 reps   Bent Knee Raise Limitations 2 sets with posterior pelvic tilt   Bridge 15 reps   Bridge Limitations with diaphragmatic breathing to engage core     Manual Therapy   Manual Therapy Muscle Energy Technique;Myofascial release   Manual therapy comments completed initiallly and separate rest of treatment   Myofascial Release MFR to L hip  flexor x 8 mins total   Muscle Energy Technique MET for Lt SI anterior rotation  f/b ambulation                PT Education - 12/23/16 1432    Education provided Yes   Education Details continue with walking and HEP.  Add in piriformis stretch for relief. Encouraged to check into getting regular massages.   Person(s) Educated Patient   Methods Explanation   Comprehension Verbalized understanding          PT Short Term Goals - 12/02/16 1606      PT SHORT TERM GOAL #1   Title Pt will be independent with HEP and perform consistently to maximize return to PLOF.   Time 2   Period Weeks   Status New     PT SHORT TERM GOAL #2   Title Pt will have improved MMT to 4+/5 throughout BLE to demosntrate improved  overall strength to maximize function and gait.   Time 2   Period Weeks   Status New     PT SHORT TERM GOAL #3   Title Pt will report being able to wash dishes for 30 mins or > to demonstrate improved tolerance to standing in order maximize participation at home.   Time 2   Period Weeks   Status New           PT Long Term Goals - 12/02/16 1607      PT LONG TERM GOAL #1   Title Pt will have improved 5xSTS to 15 sec or < to demonstrate improved overall BLE strength and power in order to maximize functional tasks at home and in the community.   Time 4   Period Weeks   Status New     PT LONG TERM GOAL #2   Title Pt will have improved lumbar extension ROM by 50% (improve to moderate limitations or better) with minimal to no LBP and no reports of radicular pain to demonstrate improved function and decrease pain overall.    Baseline maximum limitation for extension   Time 4   Period Weeks   Status New     PT LONG TERM GOAL #3   Title Pt will have improved TUG to 10 sec or < with LRAD in order to demonstrate improved overall function and maximzie participation at home and in the community.    Time 4   Period Weeks   Status New               Plan - 12/23/16 1434    Clinical Impression Statement Pt with continued anterior rotation of LT SI.  MET completed with good results, however mid walkign returned with pain.  PT tearful during session as she is frustrated with her progress.  tightness/spasms remain in Lt inguinal region with slight decrease following manual .  No increase or decrease in pain noted at end of session.   Rehab Potential Fair   PT Frequency 2x / week   PT Duration 4 weeks   PT Treatment/Interventions ADLs/Self Care Home Management;DME Instruction;Gait training;Stair training;Functional mobility training;Therapeutic activities;Therapeutic exercise;Balance training;Neuromuscular re-education;Patient/family education;Manual techniques;Passive range of  motion;Energy conservation;Taping   PT Next Visit Plan check SI alignment and complete MET if warranted.  Continue with hip flexor and hamstring stretches and proximal strengthening.  continue MFR to L hip flexor; continue seated posture strengthening (add cervical retraction) and encourage proper posture with standing; continue quad stretching.  pt to see evaluating therapist next session  to evaluate progress and change things if needed.    PT Home Exercise Plan eval: supine quad stretch; SKTC; 6/14: Pelvic tilt, hamstring and standing hip flexor stretch for Lt LE   Consulted and Agree with Plan of Care Patient      Patient will benefit from skilled therapeutic intervention in order to improve the following deficits and impairments:  Abnormal gait, Cardiopulmonary status limiting activity, Decreased activity tolerance, Decreased balance, Decreased endurance, Decreased mobility, Decreased range of motion, Decreased strength, Difficulty walking, Hypomobility, Increased muscle spasms, Impaired flexibility, Impaired sensation, Improper body mechanics, Postural dysfunction, Obesity, Pain  Visit Diagnosis: Chronic bilateral low back pain without sciatica  Muscle weakness (generalized)  Other symptoms and signs involving the musculoskeletal system  Difficulty in walking, not elsewhere classified     Problem List Patient Active Problem List   Diagnosis Date Noted  . S/P CABG x 3 06/17/2016  . Family history of early CAD 06/04/2016  . Chest pain 06/04/2016  . Abnormal nuclear stress test 06/04/2016  . Degenerative joint disease (DJD) of lumbar spine 03/24/2016  . Depression (emotion) 03/24/2016  . Obesity 08/14/2015  . Essential hypertension, benign 04/29/2015  . Hyperlipidemia 04/29/2015    Teena Irani, PTA/CLT [phone]  12/23/2016, 2:38 PM  [facility] [address] [place], [place], [address] Phone: [phone]   Fax:   [phone]  Name: Donna Woodward MRN: [id number] Date of Birth: [birth year]

## 2016-12-25 ENCOUNTER — Encounter (HOSPITAL_COMMUNITY): Payer: Self-pay

## 2016-12-25 ENCOUNTER — Ambulatory Visit (HOSPITAL_COMMUNITY): Payer: Medicare Other

## 2016-12-25 DIAGNOSIS — M6281 Muscle weakness (generalized): Secondary | ICD-10-CM | POA: Diagnosis not present

## 2016-12-25 DIAGNOSIS — R262 Difficulty in walking, not elsewhere classified: Secondary | ICD-10-CM | POA: Diagnosis not present

## 2016-12-25 DIAGNOSIS — R29898 Other symptoms and signs involving the musculoskeletal system: Secondary | ICD-10-CM

## 2016-12-25 DIAGNOSIS — M545 Low back pain: Principal | ICD-10-CM

## 2016-12-25 DIAGNOSIS — G8929 Other chronic pain: Secondary | ICD-10-CM | POA: Diagnosis not present

## 2016-12-25 DIAGNOSIS — R293 Abnormal posture: Secondary | ICD-10-CM | POA: Diagnosis not present

## 2016-12-25 NOTE — Therapy (Signed)
[place], [place], [address] Phone: [phone]   Fax:  [phone]  Physical Therapy Treatment  Patient Details  Name: Donna Woodward MRN: [id number] Date of Birth: December 14, 1955 Referring Provider: Raylene Everts, MD  Encounter Date: 12/25/2016      PT End of Session - 12/25/16 1302    Visit Number 8   Number of Visits 9   Date for PT Re-Evaluation 12/30/16   Authorization Type Medicare Part A and B   Authorization Time Period 12/02/16 to 12/30/16   Authorization - Visit Number 8   Authorization - Number of Visits 10   PT Start Time 7591   PT Stop Time 1342   PT Time Calculation (min) 40 min   Activity Tolerance Patient limited by pain;Patient tolerated treatment well;No increased pain  Pain scale 8/10 beginning and EOS, no radicular symptoms at EOS   Behavior During Therapy [facility] for tasks assessed/performed      Past Medical History:  Diagnosis Date  . Anxiety   . Back pain   . Coronary artery disease   . Degenerative joint disease (DJD) of lumbar spine 03/24/2016  . Depression   . GERD (gastroesophageal reflux disease)    if overweight  . Hyperlipidemia   . Hypertension     Past Surgical History:  Procedure Laterality Date  . BACK SURGERY    . CARDIAC CATHETERIZATION N/A 06/05/2016   Procedure: Left Heart Cath and Coronary Angiography;  Surgeon: Belva Crome, MD;  Location: [facility];  Service: Cardiovascular;  Laterality: N/A;  . CORONARY ARTERY BYPASS GRAFT N/A 06/17/2016   Procedure: CORONARY ARTERY BYPASS GRAFTING (CABG), ON PUMP, TIMES THREE, USING LEFT INTERNAL MAMMARY ARTERY, RIGHT GREATER SAPHENOUS VEIN HARVESTED ENDOSCOPICALLY;  Surgeon: Ivin Poot, MD;  Location: [place];  Service: Open Heart Surgery;  Laterality: N/A;  . MULTIPLE TOOTH EXTRACTIONS    . [place]   lamnectomy L5S1  . TEE WITHOUT CARDIOVERSION N/A 06/17/2016   Procedure: TRANSESOPHAGEAL  ECHOCARDIOGRAM (TEE);  Surgeon: Ivin Poot, MD;  Location: [place];  Service: Open Heart Surgery;  Laterality: N/A;    There were no vitals filed for this visit.      Subjective Assessment - 12/25/16 1302    Subjective Pt states that when she is upright, she's at an 8-9/10 pain. But when she sits or lays down it's an 8/10.    Pertinent History chronic LBP, CABG x 3 on 06/17/16,    Limitations Lifting;Walking;House hold activities   How long can you sit comfortably? no issues when in her recliner at home, otherwise 30 mins   How long can you stand comfortably? less than 5 mins   How long can you walk comfortably? less than 10 mins   Patient Stated Goals lower pain level,    Currently in Pain? Yes   Pain Score 8    Pain Location Back   Pain Orientation Right;Left   Pain Descriptors / Indicators Aching;Sharp   Pain Type Chronic pain   Pain Onset More than a month ago   Pain Frequency Constant   Aggravating Factors  walking, standing, getting out of bed   Pain Relieving Factors nothing   Effect of Pain on Daily Activities increases              [facility] Adult PT Treatment/Exercise - 12/25/16 0001      Manual Therapy   Manual Therapy Muscle Energy Technique;Soft tissue  mobilization   Manual therapy comments completed separate rest of treatment   Soft tissue mobilization manual L hip flexor stretch in sidelying 3x30 sec; efflurage and cross friction to L paraspinals/QL with pt in sidelying   Muscle Energy Technique attempted MET for Lt SI anterior rotation but pt had increased pain in her lower back so did not continue -- addressed this with STM              PT Education - 12/25/16 1417    Education provided Yes   Education Details continue HEP, will reassess next session   Person(s) Educated Patient   Methods Explanation   Comprehension Verbalized understanding          PT Short Term Goals - 12/02/16 1606      PT SHORT TERM GOAL #1   Title Pt will be  independent with HEP and perform consistently to maximize return to PLOF.   Time 2   Period Weeks   Status New     PT SHORT TERM GOAL #2   Title Pt will have improved MMT to 4+/5 throughout BLE to demosntrate improved overall strength to maximize function and gait.   Time 2   Period Weeks   Status New     PT SHORT TERM GOAL #3   Title Pt will report being able to wash dishes for 30 mins or > to demonstrate improved tolerance to standing in order maximize participation at home.   Time 2   Period Weeks   Status New           PT Long Term Goals - 12/02/16 1607      PT LONG TERM GOAL #1   Title Pt will have improved 5xSTS to 15 sec or < to demonstrate improved overall BLE strength and power in order to maximize functional tasks at home and in the community.   Time 4   Period Weeks   Status New     PT LONG TERM GOAL #2   Title Pt will have improved lumbar extension ROM by 50% (improve to moderate limitations or better) with minimal to no LBP and no reports of radicular pain to demonstrate improved function and decrease pain overall.    Baseline maximum limitation for extension   Time 4   Period Weeks   Status New     PT LONG TERM GOAL #3   Title Pt will have improved TUG to 10 sec or < with LRAD in order to demonstrate improved overall function and maximzie participation at home and in the community.    Time 4   Period Weeks   Status New               Plan - 12/25/16 1349    Clinical Impression Statement At beginning of session, PT spoke with pt regarding how she feels therapy is going since she became tearful last session. Pt did express some frustration because she is still in pain and doesn't feel like she has made a whole lot of progress. PT explained to pt that it is difficult to give her a specific timeline of how quikcly she may recover and that we would do her reassessment on her next visit and determine POC from there; she verbalized understanding. Pt  continues to demonstrate significantly tight hip flexors on the L as she is unable to lie fully supine and has to maintain hip flexion on the L. Manually stretched her L hip flexors with minimal relief. Attempted L  SI anterior MET again this date since she has had some success with this in previous sessions. However, she had increased LBP so did not continue. Followed that up with STM to L distal paraspinals/QL/proximal glute with pt in sidelying; pt reported feeling much better while lying still but reported no change in symptoms when ambulating out of clinic. Check goals/reassess next week and discuss options going forward.   Rehab Potential Fair   PT Frequency 2x / week   PT Duration 4 weeks   PT Treatment/Interventions ADLs/Self Care Home Management;DME Instruction;Gait training;Stair training;Functional mobility training;Therapeutic activities;Therapeutic exercise;Balance training;Neuromuscular re-education;Patient/family education;Manual techniques;Passive range of motion;Energy conservation;Taping   PT Next Visit Plan Reassess next session; check SI alignment and complete MET if warranted.  Continue with hip flexor and hamstring stretches and proximal strengthening.  continue MFR to L hip flexor; continue seated posture strengthening (add cervical retraction) and encourage proper posture with standing; continue quad stretching.  pt to see evaluating therapist next session to evaluate progress and change things if needed.    PT Home Exercise Plan eval: supine quad stretch; SKTC; 6/14: Pelvic tilt, hamstring and standing hip flexor stretch for Lt LE   Consulted and Agree with Plan of Care Patient      Patient will benefit from skilled therapeutic intervention in order to improve the following deficits and impairments:  Abnormal gait, Cardiopulmonary status limiting activity, Decreased activity tolerance, Decreased balance, Decreased endurance, Decreased mobility, Decreased range of motion, Decreased  strength, Difficulty walking, Hypomobility, Increased muscle spasms, Impaired flexibility, Impaired sensation, Improper body mechanics, Postural dysfunction, Obesity, Pain  Visit Diagnosis: Chronic bilateral low back pain without sciatica  Muscle weakness (generalized)  Other symptoms and signs involving the musculoskeletal system  Difficulty in walking, not elsewhere classified  Abnormal posture     Problem List Patient Active Problem List   Diagnosis Date Noted  . S/P CABG x 3 06/17/2016  . Family history of early CAD 06/04/2016  . Chest pain 06/04/2016  . Abnormal nuclear stress test 06/04/2016  . Degenerative joint disease (DJD) of lumbar spine 03/24/2016  . Depression (emotion) 03/24/2016  . Obesity 08/14/2015  . Essential hypertension, benign 04/29/2015  . Hyperlipidemia 04/29/2015     Geraldine Solar PT, DPT  [facility] [address] [place], [place], [address] Phone: [phone]   Fax:  [phone]  Name: Donna Woodward MRN: [id number] Date of Birth: 12-27-1955

## 2016-12-30 ENCOUNTER — Ambulatory Visit (HOSPITAL_COMMUNITY): Payer: Medicare Other | Attending: Family Medicine

## 2016-12-30 ENCOUNTER — Encounter (HOSPITAL_COMMUNITY): Payer: Self-pay

## 2016-12-30 DIAGNOSIS — R293 Abnormal posture: Secondary | ICD-10-CM | POA: Diagnosis not present

## 2016-12-30 DIAGNOSIS — M545 Low back pain, unspecified: Secondary | ICD-10-CM

## 2016-12-30 DIAGNOSIS — R262 Difficulty in walking, not elsewhere classified: Secondary | ICD-10-CM | POA: Diagnosis not present

## 2016-12-30 DIAGNOSIS — M6281 Muscle weakness (generalized): Secondary | ICD-10-CM | POA: Diagnosis not present

## 2016-12-30 DIAGNOSIS — G8929 Other chronic pain: Secondary | ICD-10-CM | POA: Insufficient documentation

## 2016-12-30 DIAGNOSIS — R29898 Other symptoms and signs involving the musculoskeletal system: Secondary | ICD-10-CM | POA: Insufficient documentation

## 2016-12-30 NOTE — Therapy (Signed)
[place], [place], [address] Phone: [phone]   Fax:  [phone]  Physical Therapy Treatment/Reassessment  Patient Details  Name: Donna Woodward MRN: [id number] Date of Birth: 08-05-1955 Referring Provider: Raylene Everts, MD  Encounter Date: 01-14-17      PT End of Session - 14-Jan-2017 1303    Visit Number 9   Number of Visits 17   Date for PT Re-Evaluation 2017/02/11   Authorization Type Medicare Part A and B   Authorization Time Period 01/14/17 to 02/11/2017 (g codes done on 23-Aug-2022 visit)   Authorization - Visit Number 9   Authorization - Number of Visits 17   PT Start Time 2774   PT Stop Time 1287   PT Time Calculation (min) 44 min   Activity Tolerance Patient limited by pain;Patient tolerated treatment well;No increased pain  Pain scale 8/10 beginning and EOS, no radicular symptoms at EOS   Behavior During Therapy [facility] for tasks assessed/performed      Past Medical History:  Diagnosis Date  . Anxiety   . Back pain   . Coronary artery disease   . Degenerative joint disease (DJD) of lumbar spine 03/24/2016  . Depression   . GERD (gastroesophageal reflux disease)    if overweight  . Hyperlipidemia   . Hypertension     Past Surgical History:  Procedure Laterality Date  . BACK SURGERY    . CARDIAC CATHETERIZATION N/A 06/05/2016   Procedure: Left Heart Cath and Coronary Angiography;  Surgeon: Belva Crome, MD;  Location: [facility];  Service: Cardiovascular;  Laterality: N/A;  . CORONARY ARTERY BYPASS GRAFT N/A 06/17/2016   Procedure: CORONARY ARTERY BYPASS GRAFTING (CABG), ON PUMP, TIMES THREE, USING LEFT INTERNAL MAMMARY ARTERY, RIGHT GREATER SAPHENOUS VEIN HARVESTED ENDOSCOPICALLY;  Surgeon: Ivin Poot, MD;  Location: [place];  Service: Open Heart Surgery;  Laterality: N/A;  . MULTIPLE TOOTH EXTRACTIONS    . [place]   lamnectomy L5S1  . TEE WITHOUT CARDIOVERSION N/A 06/17/2016    Procedure: TRANSESOPHAGEAL ECHOCARDIOGRAM (TEE);  Surgeon: Ivin Poot, MD;  Location: [facility];  Service: Open Heart Surgery;  Laterality: N/A;    There were no vitals filed for this visit.      Subjective Assessment - 14-Jan-2017 1304    Subjective Pt states that her back felt better yesterday, but she is back at a 9/10 today. She's been putting aspercreme on her back which helps it a little.   Pertinent History chronic LBP, CABG x 3 on 06/17/16,    Limitations Lifting;Walking;House hold activities   How long can you sit comfortably? no issues when in her recliner at home, otherwise 30 mins   How long can you stand comfortably? less than 5 mins   How long can you walk comfortably? less than 10 mins   Patient Stated Goals lower pain level,    Currently in Pain? Yes   Pain Score 9    Pain Location Back   Pain Orientation Right;Left   Pain Descriptors / Indicators Aching;Sharp   Pain Type Chronic pain   Pain Onset More than a month ago   Pain Frequency Constant   Aggravating Factors  walking, standing, getting out of bed   Pain Relieving Factors nothing   Effect of Pain on Daily Activities increases            [facility] PT Assessment - 2017-01-14 0001      AROM  Lumbar Extension mod limitations (50% limited)     Strength   Right Hip Flexion 5/5  was 4+   Right Hip Extension 3/5  was 3   Right Hip ABduction 4/5  was 4   Left Hip Flexion 5/5  was 4+   Left Hip Extension 2+/5  was  3 (unable to fully lift off table this date)   Left Hip ABduction 4+/5  was 4   Right Ankle Dorsiflexion 5/5  was 4+   Left Ankle Dorsiflexion 4+/5  was 4     Standardized Balance Assessment   Standardized Balance Assessment Timed Up and Go Test;Five Times Sit to Stand   Five times sit to stand comments  25 sec from chair, no support     Timed Up and Go Test   TUG Normal TUG   Normal TUG (seconds) 20.6  SPC               [facility] Adult PT Treatment/Exercise - 12/30/16 0001       Lumbar Exercises: Stretches   Prone on Elbows Stretch Limitations prone hip flexor stretch (just lying flat) x 2 mins; POE x 1 min (pt felt relief in LBP and felt her hip flexors loosen up)     Lumbar Exercises: Standing   Functional Squats 10 reps   Functional Squats Limitations chair behind for form   Other Standing Lumbar Exercises step ups on 4" step x 5 reps each     Lumbar Exercises: Seated   LAQ on Chair Limitations Thoracic extension and rotation x 5 reps each (pt with increased LBP and L radicular symptoms); repeated thoracic rotation x 10 reps each way (no change in radicular symptoms)                PT Education - 12/30/16 1330    Education provided Yes   Education Details reassessment findings, POC, exercise technique   Person(s) Educated Patient   Methods Explanation;Demonstration   Comprehension Verbalized understanding;Returned demonstration          PT Short Term Goals - 12/30/16 1309      PT SHORT TERM GOAL #1   Title Pt will be independent with HEP and perform consistently to maximize return to PLOF.   Time 2   Period Weeks   Status Achieved     PT SHORT TERM GOAL #2   Title Pt will have improved MMT to 4+/5 throughout BLE to demosntrate improved overall strength to maximize function and gait.   Baseline 7/3: hip ext and abd still the most deficient   Time 2   Period Weeks   Status Partially Met     PT SHORT TERM GOAL #3   Title Pt will report being able to wash dishes for 30 mins or > to demonstrate improved tolerance to standing in order maximize participation at home.   Baseline 7/3: 10 mins but she is holding on   Time 2   Period Weeks   Status On-going           PT Long Term Goals - 12/30/16 1310      PT LONG TERM GOAL #1   Title Pt will have improved 5xSTS to 15 sec or < to demonstrate improved overall BLE strength and power in order to maximize functional tasks at home and in the community.   Baseline 7/3: 25 sec   Time 4    Period Weeks   Status On-going     PT LONG TERM GOAL #  2   Title Pt will have improved lumbar extension ROM by 50% (improve to moderate limitations or better) with minimal to no LBP and no reports of radicular pain to demonstrate improved function and decrease pain overall.    Baseline 01-30-23: pt mod limitation for extension this date   Time 4   Period Weeks   Status Achieved     PT LONG TERM GOAL #3   Title Pt will have improved TUG to 10 sec or < with LRAD in order to demonstrate improved overall function and maximzie participation at home and in the community.    Baseline 01-30-2023: 20.6 sec with SPC   Time 4   Period Weeks   Status On-going               Plan - 2017-01-29 1352    Clinical Impression Statement PT reassessed pt's goals and outcome measures this date. Pt has made good progress towards goals as she has met 2, partially met 1, with the remaining 3 on-going. Pt's strength has improved but she is still quite deficient in her proximal hip musculature. Pt verbalizes she feels 20-25% improved which is that her constant pain isn't as high on the pain scale as it was prior to therapy, and the 80% remaining is that her pain is still present and she has deficits in functional tasks (turning over in bed, performing chores, etc.). Pt would benefit from continued skilled PT services to address her functional weakness and improve her overall balance, gait, and function. Since pt did not find relief from SI MET and SI alignment over the last few sessions, PT recommends focusing POC on improving quad flexibility and functional strength as well as promoting more upright posture in standing. However, if pt is having exacerbation of symptoms, then trial return to SI MET to assess response.    Rehab Potential Fair   PT Frequency 2x / week   PT Duration 4 weeks   PT Treatment/Interventions ADLs/Self Care Home Management;DME Instruction;Gait training;Stair training;Functional mobility  training;Therapeutic activities;Therapeutic exercise;Balance training;Neuromuscular re-education;Patient/family education;Manual techniques;Passive range of motion;Energy conservation;Taping   PT Next Visit Plan focus on proximal hip and functional strengthening for the next few sessions to assess response; POE stretch, fwd/lat step ups, practice hip hinge with dowel, mini squats again, mini lunging (can do with front foot elevated), side stepping, SLS   PT Home Exercise Plan eval: supine quad stretch; SKTC; 6/14: Pelvic tilt, hamstring and standing hip flexor stretch for Lt LE; 01-30-23: POE   Consulted and Agree with Plan of Care Patient      Patient will benefit from skilled therapeutic intervention in order to improve the following deficits and impairments:  Abnormal gait, Cardiopulmonary status limiting activity, Decreased activity tolerance, Decreased balance, Decreased endurance, Decreased mobility, Decreased range of motion, Decreased strength, Difficulty walking, Hypomobility, Increased muscle spasms, Impaired flexibility, Impaired sensation, Improper body mechanics, Postural dysfunction, Obesity, Pain  Visit Diagnosis: Chronic bilateral low back pain without sciatica - Plan: PT plan of care cert/re-cert  Muscle weakness (generalized) - Plan: PT plan of care cert/re-cert  Other symptoms and signs involving the musculoskeletal system - Plan: PT plan of care cert/re-cert  Difficulty in walking, not elsewhere classified - Plan: PT plan of care cert/re-cert  Abnormal posture - Plan: PT plan of care cert/re-cert       G-Codes - 01/29/17 1403    Functional Assessment Tool Used (Outpatient Only) clinical judgement, FOTO, 5xSTS   Functional Limitation Mobility: Walking and moving around  Mobility: Walking and Moving Around Current Status [phone]) At least 60 percent but less than 80 percent impaired, limited or restricted   Mobility: Walking and Moving Around Goal Status [phone]) At least 20  percent but less than 40 percent impaired, limited or restricted      Problem List Patient Active Problem List   Diagnosis Date Noted  . S/P CABG x 3 06/17/2016  . Family history of early CAD 06/04/2016  . Chest pain 06/04/2016  . Abnormal nuclear stress test 06/04/2016  . Degenerative joint disease (DJD) of lumbar spine 03/24/2016  . Depression (emotion) 03/24/2016  . Obesity 08/14/2015  . Essential hypertension, benign 04/29/2015  . Hyperlipidemia 04/29/2015     Geraldine Solar PT, DPT  [facility] [address] [place], [place], [address] Phone: [phone]   Fax:  [phone]  Name: Donna Woodward MRN: [id number] Date of Birth: 1956-06-22

## 2017-01-01 ENCOUNTER — Encounter (HOSPITAL_COMMUNITY): Payer: Self-pay

## 2017-01-01 ENCOUNTER — Ambulatory Visit (HOSPITAL_COMMUNITY): Payer: Medicare Other

## 2017-01-01 DIAGNOSIS — G8929 Other chronic pain: Secondary | ICD-10-CM

## 2017-01-01 DIAGNOSIS — R293 Abnormal posture: Secondary | ICD-10-CM | POA: Diagnosis not present

## 2017-01-01 DIAGNOSIS — R29898 Other symptoms and signs involving the musculoskeletal system: Secondary | ICD-10-CM

## 2017-01-01 DIAGNOSIS — M545 Low back pain, unspecified: Secondary | ICD-10-CM

## 2017-01-01 DIAGNOSIS — M6281 Muscle weakness (generalized): Secondary | ICD-10-CM | POA: Diagnosis not present

## 2017-01-01 DIAGNOSIS — R262 Difficulty in walking, not elsewhere classified: Secondary | ICD-10-CM

## 2017-01-01 NOTE — Therapy (Signed)
[place], [place], [address] Phone: [phone]   Fax:  [phone]  Physical Therapy Treatment  Patient Details  Name: Donna Woodward MRN: [id number] Date of Birth: [birth year] Referring Provider: Raylene Everts, MD  Encounter Date: 01/01/2017      PT End of Session - 01/01/17 1306    Visit Number 10   Number of Visits 17   Date for PT Re-Evaluation 02-11-2017   Authorization Type Medicare Part A and B   Authorization Time Period Jan 14, 2017 to Feb 11, 2017 (g codes done on 23-Aug-2022 visit)   Authorization - Visit Number 10   Authorization - Number of Visits 17   PT Start Time 1301   PT Stop Time 1343   PT Time Calculation (min) 42 min   Activity Tolerance Patient limited by pain;Patient tolerated treatment well;No increased pain  Pain scale 8/10 beginning and EOS, no radicular symptoms at EOS   Behavior During Therapy [facility] for tasks assessed/performed      Past Medical History:  Diagnosis Date  . Anxiety   . Back pain   . Coronary artery disease   . Degenerative joint disease (DJD) of lumbar spine 03/24/2016  . Depression   . GERD (gastroesophageal reflux disease)    if overweight  . Hyperlipidemia   . Hypertension     Past Surgical History:  Procedure Laterality Date  . BACK SURGERY    . CARDIAC CATHETERIZATION N/A 06/05/2016   Procedure: Left Heart Cath and Coronary Angiography;  Surgeon: Belva Crome, MD;  Location: [place] [facility];  Service: Cardiovascular;  Laterality: N/A;  . CORONARY ARTERY BYPASS GRAFT N/A 06/17/2016   Procedure: CORONARY ARTERY BYPASS GRAFTING (CABG), ON PUMP, TIMES THREE, USING LEFT INTERNAL MAMMARY ARTERY, RIGHT GREATER SAPHENOUS VEIN HARVESTED ENDOSCOPICALLY;  Surgeon: Ivin Poot, MD;  Location: [place];  Service: Open Heart Surgery;  Laterality: N/A;  . MULTIPLE TOOTH EXTRACTIONS    . [place]   lamnectomy L5S1  . TEE WITHOUT CARDIOVERSION N/A 06/17/2016   Procedure: TRANSESOPHAGEAL ECHOCARDIOGRAM (TEE);  Surgeon: Ivin Poot, MD;  Location: [place];  Service: Open Heart Surgery;  Laterality: N/A;    There were no vitals filed for this visit.      Subjective Assessment - 01/01/17 1303    Subjective Pt states she was sore following last treatment session. Her pain has mainly been in her lower back but she intermittently gets the L radicular symptoms at night and when she does her exercises. She rates her pain as 8/10 today.   Pertinent History chronic LBP, CABG x 3 on 06/17/16,    Limitations Lifting;Walking;House hold activities   How long can you sit comfortably? no issues when in her recliner at home, otherwise 30 mins   How long can you stand comfortably? less than 5 mins   How long can you walk comfortably? less than 10 mins   Patient Stated Goals lower pain level,    Currently in Pain? Yes   Pain Score 8    Pain Location Back   Pain Orientation Lower;Left   Pain Descriptors / Indicators Aching;Sharp   Pain Type Chronic pain   Pain Onset More than a month ago   Pain Frequency Constant   Aggravating Factors  walking, standing, getting out of bed   Pain Relieving Factors nothing   Effect of Pain on Daily Activities increases  [place] [facility] Adult PT Treatment/Exercise - 01/01/17 0001      Lumbar Exercises: Standing   Heel Raises 20 reps   Heel Raises Limitations heel and toe   Functional Squats 10 reps   Functional Squats Limitations chair behind for form   Other Standing Lumbar Exercises standing hip abd with RTB 2x10 each; bil SLS 5x10" holds each with no UE support   Other Standing Lumbar Exercises fwd/lat step ups on 4" step x10 reps each; sidestepping with RTB 66f x2RT     Lumbar Exercises: Seated   Sit to Stand 10 reps   Sit to Stand Limitations no UEs     Lumbar Exercises: Supine   Bridge 10 reps   Bridge Limitations 2 sets, RTB around knees     Lumbar Exercises: Quadruped   Single Arm Raises  Limitations bil hip ext 2x10 (cues to push through heel to increase glute engagement)                PT Education - 01/01/17 1343    Education provided Yes   Education Details exercise technique, can do sidestepping at home   Person(s) Educated Patient   Methods Explanation;Demonstration   Comprehension Verbalized understanding;Returned demonstration          PT Short Term Goals - 12/30/16 1309      PT SHORT TERM GOAL #1   Title Pt will be independent with HEP and perform consistently to maximize return to PLOF.   Time 2   Period Weeks   Status Achieved     PT SHORT TERM GOAL #2   Title Pt will have improved MMT to 4+/5 throughout BLE to demosntrate improved overall strength to maximize function and gait.   Baseline 7/3: hip ext and abd still the most deficient   Time 2   Period Weeks   Status Partially Met     PT SHORT TERM GOAL #3   Title Pt will report being able to wash dishes for 30 mins or > to demonstrate improved tolerance to standing in order maximize participation at home.   Baseline 7/3: 10 mins but she is holding on   Time 2   Period Weeks   Status On-going           PT Long Term Goals - 12/30/16 1310      PT LONG TERM GOAL #1   Title Pt will have improved 5xSTS to 15 sec or < to demonstrate improved overall BLE strength and power in order to maximize functional tasks at home and in the community.   Baseline 7/3: 25 sec   Time 4   Period Weeks   Status On-going     PT LONG TERM GOAL #2   Title Pt will have improved lumbar extension ROM by 50% (improve to moderate limitations or better) with minimal to no LBP and no reports of radicular pain to demonstrate improved function and decrease pain overall.    Baseline 7/3: pt mod limitation for extension this date   Time 4   Period Weeks   Status Achieved     PT LONG TERM GOAL #3   Title Pt will have improved TUG to 10 sec or < with LRAD in order to demonstrate improved overall function and  maximzie participation at home and in the community.    Baseline 7/3: 20.6 sec with SPC   Time 4   Period Weeks   Status On-going  Plan - 01/01/17 1344    Clinical Impression Statement Session focused on BLE hip and functional strengthening. She did not appear to be in much physical distress, though she reported that some of the exercises exacerbated her pain. She was educated that she could add sidestepping with RTB at home and she verbalized understanding. Attempted to perform hip hinge with dowel to improve form and decrease stress on lower back, but due to thoracic kyphosis, pt unable to attain 3 points of contact so did not continue with this activity this date. Continue to perform hip and functional strengthening as well as promoting improved posture in standing.    Rehab Potential Fair   PT Frequency 2x / week   PT Duration 4 weeks   PT Treatment/Interventions ADLs/Self Care Home Management;DME Instruction;Gait training;Stair training;Functional mobility training;Therapeutic activities;Therapeutic exercise;Balance training;Neuromuscular re-education;Patient/family education;Manual techniques;Passive range of motion;Energy conservation;Taping   PT Next Visit Plan focus on proximal hip and functional strengthening for the next few sessions to assess response; continue functional, hip, and core strengthening, progressing to pt's tolerance   PT Home Exercise Plan eval: supine quad stretch; SKTC; 6/14: Pelvic tilt, hamstring and standing hip flexor stretch for Lt LE; 7/3: POE; 7/5: sidestepping with RTB   Consulted and Agree with Plan of Care Patient      Patient will benefit from skilled therapeutic intervention in order to improve the following deficits and impairments:  Abnormal gait, Cardiopulmonary status limiting activity, Decreased activity tolerance, Decreased balance, Decreased endurance, Decreased mobility, Decreased range of motion, Decreased strength, Difficulty  walking, Hypomobility, Increased muscle spasms, Impaired flexibility, Impaired sensation, Improper body mechanics, Postural dysfunction, Obesity, Pain  Visit Diagnosis: Chronic bilateral low back pain without sciatica  Muscle weakness (generalized)  Other symptoms and signs involving the musculoskeletal system  Difficulty in walking, not elsewhere classified  Abnormal posture     Problem List Patient Active Problem List   Diagnosis Date Noted  . S/P CABG x 3 06/17/2016  . Family history of early CAD 06/04/2016  . Chest pain 06/04/2016  . Abnormal nuclear stress test 06/04/2016  . Degenerative joint disease (DJD) of lumbar spine 03/24/2016  . Depression (emotion) 03/24/2016  . Obesity 08/14/2015  . Essential hypertension, benign 04/29/2015  . Hyperlipidemia 04/29/2015     Geraldine Solar PT, DPT  [facility] [address] [place], [place], [address] Phone: [phone]   Fax:  [phone]  Name: BARBARAANN AVANS MRN: [id number] Date of Birth: Apr 19, 1956

## 2017-01-06 ENCOUNTER — Ambulatory Visit (HOSPITAL_COMMUNITY): Payer: Medicare Other

## 2017-01-06 DIAGNOSIS — R293 Abnormal posture: Secondary | ICD-10-CM

## 2017-01-06 DIAGNOSIS — R262 Difficulty in walking, not elsewhere classified: Secondary | ICD-10-CM

## 2017-01-06 DIAGNOSIS — M6281 Muscle weakness (generalized): Secondary | ICD-10-CM | POA: Diagnosis not present

## 2017-01-06 DIAGNOSIS — G8929 Other chronic pain: Secondary | ICD-10-CM

## 2017-01-06 DIAGNOSIS — M545 Low back pain: Secondary | ICD-10-CM | POA: Diagnosis not present

## 2017-01-06 DIAGNOSIS — R29898 Other symptoms and signs involving the musculoskeletal system: Secondary | ICD-10-CM

## 2017-01-06 NOTE — Therapy (Signed)
[place], [place], [address] Phone: [phone]   Fax:  [phone]  Physical Therapy Treatment  Patient Details  Name: Donna Woodward MRN: [id number] Date of Birth: 1955-09-21 Referring Provider: Raylene Everts, MD  Encounter Date: 01/06/2017      PT End of Session - 01/06/17 1128    Visit Number 11   Number of Visits 17   Date for PT Re-Evaluation 30-Jan-2017   Authorization Type Medicare Part A and B   Authorization Time Period 01/02/2017 to 01-30-2017 (g codes done on August 11, 2022 visit)   Authorization - Visit Number 11   Authorization - Number of Visits 17   PT Start Time 1120   PT Stop Time 1203   PT Time Calculation (min) 43 min   Activity Tolerance Patient limited by pain;Patient tolerated treatment well;No increased pain  Pain scale stayed at 8/10   Behavior During Therapy [facility] for tasks assessed/performed      Past Medical History:  Diagnosis Date  . Anxiety   . Back pain   . Coronary artery disease   . Degenerative joint disease (DJD) of lumbar spine 03/24/2016  . Depression   . GERD (gastroesophageal reflux disease)    if overweight  . Hyperlipidemia   . Hypertension     Past Surgical History:  Procedure Laterality Date  . BACK SURGERY    . CARDIAC CATHETERIZATION N/A 06/05/2016   Procedure: Left Heart Cath and Coronary Angiography;  Surgeon: Belva Crome, MD;  Location: [facility] [facility];  Service: Cardiovascular;  Laterality: N/A;  . CORONARY ARTERY BYPASS GRAFT N/A 06/17/2016   Procedure: CORONARY ARTERY BYPASS GRAFTING (CABG), ON PUMP, TIMES THREE, USING LEFT INTERNAL MAMMARY ARTERY, RIGHT GREATER SAPHENOUS VEIN HARVESTED ENDOSCOPICALLY;  Surgeon: Ivin Poot, MD;  Location: [place];  Service: Open Heart Surgery;  Laterality: N/A;  . MULTIPLE TOOTH EXTRACTIONS    . [place]   lamnectomy L5S1  . TEE WITHOUT CARDIOVERSION N/A 06/17/2016   Procedure: TRANSESOPHAGEAL ECHOCARDIOGRAM  (TEE);  Surgeon: Ivin Poot, MD;  Location: [facility];  Service: Open Heart Surgery;  Laterality: N/A;    There were no vitals filed for this visit.      Subjective Assessment - 01/06/17 1124    Subjective Pt stated pain constant pain lower back with intermittent Lt thigh burning wiht certain movements, pain scale 8/10 today.     Pertinent History chronic LBP, CABG x 3 on 06/17/16,    Patient Stated Goals lower pain level,    Currently in Pain? Yes   Pain Score 8    Pain Location Back   Pain Orientation Lower   Pain Descriptors / Indicators Aching;Sharp   Pain Type Chronic pain   Pain Onset More than a month ago   Pain Frequency Constant   Aggravating Factors  walking, standing, getting out of bed   Pain Relieving Factors nothing   Effect of Pain on Daily Activities increases                         [facility] Adult PT Treatment/Exercise - 01/06/17 0001      Lumbar Exercises: Stretches   Quad Stretch 2 reps;30 seconds   Quad Stretch Limitations Lt LE thomas st on edge of bed     Lumbar Exercises: Standing   Heel Raises 20 reps   Heel Raises Limitations heel and toe   Functional Squats 10 reps  Functional Squats Limitations chair behind for form   Forward Lunge 10 reps   Forward Lunge Limitations 6in step   Wall Slides Limitations wall walk then Y from wall 2RT   Other Standing Lumbar Exercises fwd/lat step ups on 4" step x10 reps each; sidestepping with RTB 61f x2RT     Lumbar Exercises: Seated   Sit to Stand 10 reps   Sit to Stand Limitations no UEs     Lumbar Exercises: Supine   Bridge 10 reps   Bridge Limitations 2 sets, RTB around knees                  PT Short Term Goals - 12/30/16 1309      PT SHORT TERM GOAL #1   Title Pt will be independent with HEP and perform consistently to maximize return to PLOF.   Time 2   Period Weeks   Status Achieved     PT SHORT TERM GOAL #2   Title Pt will have improved MMT to 4+/5 throughout BLE  to demosntrate improved overall strength to maximize function and gait.   Baseline 7/3: hip ext and abd still the most deficient   Time 2   Period Weeks   Status Partially Met     PT SHORT TERM GOAL #3   Title Pt will report being able to wash dishes for 30 mins or > to demonstrate improved tolerance to standing in order maximize participation at home.   Baseline 7/3: 10 mins but she is holding on   Time 2   Period Weeks   Status On-going           PT Long Term Goals - 12/30/16 1310      PT LONG TERM GOAL #1   Title Pt will have improved 5xSTS to 15 sec or < to demonstrate improved overall BLE strength and power in order to maximize functional tasks at home and in the community.   Baseline 7/3: 25 sec   Time 4   Period Weeks   Status On-going     PT LONG TERM GOAL #2   Title Pt will have improved lumbar extension ROM by 50% (improve to moderate limitations or better) with minimal to no LBP and no reports of radicular pain to demonstrate improved function and decrease pain overall.    Baseline 7/3: pt mod limitation for extension this date   Time 4   Period Weeks   Status Achieved     PT LONG TERM GOAL #3   Title Pt will have improved TUG to 10 sec or < with LRAD in order to demonstrate improved overall function and maximzie participation at home and in the community.    Baseline 7/3: 20.6 sec with SPC   Time 4   Period Weeks   Status On-going               Plan - 01/06/17 1211    Clinical Impression Statement Session foucs on improving proximal strengthening.  Pt limited by pain through session though did not appear to be in distress with any activities.  Pt required cueing to improve posture and form with therex through session to reduce stress on lower back.  No reports of increased or decreased pain through session.     Clinical Presentation Evolving   Clinical Decision Making Moderate   Rehab Potential Fair   PT Frequency 2x / week   PT Duration 4 weeks    PT Treatment/Interventions ADLs/Self Care Home  Management;DME Instruction;Gait training;Stair training;Functional mobility training;Therapeutic activities;Therapeutic exercise;Balance training;Neuromuscular re-education;Patient/family education;Manual techniques;Passive range of motion;Energy conservation;Taping   PT Next Visit Plan focus on proximal hip and functional strengthening for the next few sessions to assess response; continue functional, hip, and core strengthening, progressing to pt's tolerance      Patient will benefit from skilled therapeutic intervention in order to improve the following deficits and impairments:  Abnormal gait, Cardiopulmonary status limiting activity, Decreased activity tolerance, Decreased balance, Decreased endurance, Decreased mobility, Decreased range of motion, Decreased strength, Difficulty walking, Hypomobility, Increased muscle spasms, Impaired flexibility, Impaired sensation, Improper body mechanics, Postural dysfunction, Obesity, Pain  Visit Diagnosis: Chronic bilateral low back pain without sciatica  Muscle weakness (generalized)  Other symptoms and signs involving the musculoskeletal system  Difficulty in walking, not elsewhere classified  Abnormal posture     Problem List Patient Active Problem List   Diagnosis Date Noted  . S/P CABG x 3 06/17/2016  . Family history of early CAD 06/04/2016  . Chest pain 06/04/2016  . Abnormal nuclear stress test 06/04/2016  . Degenerative joint disease (DJD) of lumbar spine 03/24/2016  . Depression (emotion) 03/24/2016  . Obesity 08/14/2015  . Essential hypertension, benign 04/29/2015  . Hyperlipidemia 04/29/2015   Ihor Austin, [place]; [place]  Aldona Lento 01/06/2017, 1:54 PM  [facility] [address] [place], [place], [address] Phone: [phone]   Fax:  [phone]  Name: Donna Woodward MRN: [id number] Date of Birth:  06-Aug-1955

## 2017-01-07 ENCOUNTER — Encounter: Payer: Self-pay | Admitting: Family Medicine

## 2017-01-08 ENCOUNTER — Ambulatory Visit (HOSPITAL_COMMUNITY): Payer: Medicare Other

## 2017-01-08 DIAGNOSIS — G8929 Other chronic pain: Secondary | ICD-10-CM | POA: Diagnosis not present

## 2017-01-08 DIAGNOSIS — R293 Abnormal posture: Secondary | ICD-10-CM

## 2017-01-08 DIAGNOSIS — R29898 Other symptoms and signs involving the musculoskeletal system: Secondary | ICD-10-CM | POA: Diagnosis not present

## 2017-01-08 DIAGNOSIS — M545 Low back pain: Secondary | ICD-10-CM | POA: Diagnosis not present

## 2017-01-08 DIAGNOSIS — M6281 Muscle weakness (generalized): Secondary | ICD-10-CM

## 2017-01-08 DIAGNOSIS — R262 Difficulty in walking, not elsewhere classified: Secondary | ICD-10-CM | POA: Diagnosis not present

## 2017-01-08 NOTE — Therapy (Signed)
[place], [place], [address] Phone: [phone]   Fax:  [phone]  Physical Therapy Treatment  Patient Details  Name: Donna Woodward MRN: [id number] Date of Birth: 08/27/1955 Referring Provider: Raylene Everts, MD  Encounter Date: 01/08/2017      PT End of Session - 01/08/17 0828    Visit Number 12   Number of Visits 17   Date for PT Re-Evaluation 02-02-17   Authorization Type Medicare Part A and B   Authorization Time Period 01-05-17 to Feb 02, 2017 (g codes done on 2022/08/14 visit)   Authorization - Visit Number 12   Authorization - Number of Visits 17   PT Start Time 0820   PT Stop Time 0900   PT Time Calculation (min) 40 min   Activity Tolerance Patient limited by pain;Patient tolerated treatment well;No increased pain  Pain scale stayed at 8/10 through session    Behavior During Therapy [facility] for tasks assessed/performed      Past Medical History:  Diagnosis Date  . Anxiety   . Back pain   . Coronary artery disease   . Degenerative joint disease (DJD) of lumbar spine 03/24/2016  . Depression   . GERD (gastroesophageal reflux disease)    if overweight  . Hyperlipidemia   . Hypertension     Past Surgical History:  Procedure Laterality Date  . BACK SURGERY    . CARDIAC CATHETERIZATION N/A 06/05/2016   Procedure: Left Heart Cath and Coronary Angiography;  Surgeon: Belva Crome, MD;  Location: [facility];  Service: Cardiovascular;  Laterality: N/A;  . CORONARY ARTERY BYPASS GRAFT N/A 06/17/2016   Procedure: CORONARY ARTERY BYPASS GRAFTING (CABG), ON PUMP, TIMES THREE, USING LEFT INTERNAL MAMMARY ARTERY, RIGHT GREATER SAPHENOUS VEIN HARVESTED ENDOSCOPICALLY;  Surgeon: Ivin Poot, MD;  Location: [place];  Service: Open Heart Surgery;  Laterality: N/A;  . MULTIPLE TOOTH EXTRACTIONS    . [place]   lamnectomy L5S1  . TEE WITHOUT CARDIOVERSION N/A 06/17/2016   Procedure: TRANSESOPHAGEAL  ECHOCARDIOGRAM (TEE);  Surgeon: Ivin Poot, MD;  Location: [facility];  Service: Open Heart Surgery;  Laterality: N/A;    There were no vitals filed for this visit.      Subjective Assessment - 01/08/17 0822    Subjective Pt reports more aware of posture.  Reports relief following stretches Lt thigh, continues to have to watch movements.  Pain scale 8/10   Pertinent History chronic LBP, CABG x 3 on 06/17/16,    Patient Stated Goals lower pain level,    Currently in Pain? Yes   Pain Score 8    Pain Location Back   Pain Orientation Lower;Left;Right  Lt > Rt   Pain Type Chronic pain   Pain Onset More than a month ago   Pain Frequency Constant   Aggravating Factors  walking, standing, getting out of bed   Pain Relieving Factors nothing   Effect of Pain on Daily Activities increases                         [facility] Adult PT Treatment/Exercise - 01/08/17 0001      Lumbar Exercises: Stretches   Quad Stretch 3 reps;30 seconds  Prone quad st with sheet as well as supine [employer] Stretch Limitations Prone quad st with sheet as well as supine Thomas st     Lumbar Exercises: Standing   Heel  Raises 20 reps   Heel Raises Limitations heel and toe   Functional Squats 10 reps   Functional Squats Limitations chair behind for form   Forward Lunge 10 reps   Forward Lunge Limitations 6in step     Lumbar Exercises: Supine   Bridge 15 reps   Bridge Limitations RTB     Manual Therapy   Manual Therapy Soft tissue mobilization   Manual therapy comments completed separate rest of treatment   Soft tissue mobilization manual to iliopsoas in supine semirecumbent position                  PT Short Term Goals - 12/30/16 1309      PT SHORT TERM GOAL #1   Title Pt will be independent with HEP and perform consistently to maximize return to PLOF.   Time 2   Period Weeks   Status Achieved     PT SHORT TERM GOAL #2   Title Pt will have improved MMT to 4+/5  throughout BLE to demosntrate improved overall strength to maximize function and gait.   Baseline 7/3: hip ext and abd still the most deficient   Time 2   Period Weeks   Status Partially Met     PT SHORT TERM GOAL #3   Title Pt will report being able to wash dishes for 30 mins or > to demonstrate improved tolerance to standing in order maximize participation at home.   Baseline 7/3: 10 mins but she is holding on   Time 2   Period Weeks   Status On-going           PT Long Term Goals - 12/30/16 1310      PT LONG TERM GOAL #1   Title Pt will have improved 5xSTS to 15 sec or < to demonstrate improved overall BLE strength and power in order to maximize functional tasks at home and in the community.   Baseline 7/3: 25 sec   Time 4   Period Weeks   Status On-going     PT LONG TERM GOAL #2   Title Pt will have improved lumbar extension ROM by 50% (improve to moderate limitations or better) with minimal to no LBP and no reports of radicular pain to demonstrate improved function and decrease pain overall.    Baseline 7/3: pt mod limitation for extension this date   Time 4   Period Weeks   Status Achieved     PT LONG TERM GOAL #3   Title Pt will have improved TUG to 10 sec or < with LRAD in order to demonstrate improved overall function and maximzie participation at home and in the community.    Baseline 7/3: 20.6 sec with SPC   Time 4   Period Weeks   Status On-going               Plan - 01/08/17 1497    Clinical Impression Statement Session focus on proximal strenghtening and techniques for pain control.  Pt continues to be limited by pain through session though did not appear to be in distress with any activity..  Pt continues to report positive results with following stretches especially Thomas and prone quad stretches.  Trial with manual deep tissue mobilization techniques to iliopsoas for pain relief.  No reports of increased pain through session.  Pt presents with  improved awareness of posture through session.   Rehab Potential Fair   PT Frequency 2x / week   PT Duration 4  weeks   PT Treatment/Interventions ADLs/Self Care Home Management;DME Instruction;Gait training;Stair training;Functional mobility training;Therapeutic activities;Therapeutic exercise;Balance training;Neuromuscular re-education;Patient/family education;Manual techniques;Passive range of motion;Energy conservation;Taping   PT Next Visit Plan f/u with results following addition of iliopsoas manual.  Continue focus on proximal hip and functional strengthening for the next few sessions to assess response; continue functional, hip, and core strengthening, progressing to pt's tolerance   PT Home Exercise Plan eval: supine quad stretch; SKTC; 6/14: Pelvic tilt, hamstring and standing hip flexor stretch for Lt LE; 7/3: POE; 7/5: sidestepping with RTB      Patient will benefit from skilled therapeutic intervention in order to improve the following deficits and impairments:  Abnormal gait, Cardiopulmonary status limiting activity, Decreased activity tolerance, Decreased balance, Decreased endurance, Decreased mobility, Decreased range of motion, Decreased strength, Difficulty walking, Hypomobility, Increased muscle spasms, Impaired flexibility, Impaired sensation, Improper body mechanics, Postural dysfunction, Obesity, Pain  Visit Diagnosis: Chronic bilateral low back pain without sciatica  Muscle weakness (generalized)  Other symptoms and signs involving the musculoskeletal system  Difficulty in walking, not elsewhere classified  Abnormal posture     Problem List Patient Active Problem List   Diagnosis Date Noted  . S/P CABG x 3 06/17/2016  . Family history of early CAD 06/04/2016  . Chest pain 06/04/2016  . Abnormal nuclear stress test 06/04/2016  . Degenerative joint disease (DJD) of lumbar spine 03/24/2016  . Depression (emotion) 03/24/2016  . Obesity 08/14/2015  . Essential  hypertension, benign 04/29/2015  . Hyperlipidemia 04/29/2015   Ihor Austin, [place]; [place]  Aldona Lento 01/08/2017, 9:28 AM  [place], [place], [address] Phone: [phone]   Fax:  [phone]  Name: Donna Woodward MRN: [id number] Date of Birth: [birth year]

## 2017-01-13 ENCOUNTER — Ambulatory Visit (HOSPITAL_COMMUNITY): Payer: Medicare Other

## 2017-01-13 DIAGNOSIS — R262 Difficulty in walking, not elsewhere classified: Secondary | ICD-10-CM | POA: Diagnosis not present

## 2017-01-13 DIAGNOSIS — G8929 Other chronic pain: Secondary | ICD-10-CM

## 2017-01-13 DIAGNOSIS — R293 Abnormal posture: Secondary | ICD-10-CM

## 2017-01-13 DIAGNOSIS — M6281 Muscle weakness (generalized): Secondary | ICD-10-CM | POA: Diagnosis not present

## 2017-01-13 DIAGNOSIS — R29898 Other symptoms and signs involving the musculoskeletal system: Secondary | ICD-10-CM

## 2017-01-13 DIAGNOSIS — M545 Low back pain: Secondary | ICD-10-CM | POA: Diagnosis not present

## 2017-01-13 NOTE — Patient Instructions (Signed)
Bracing With Arm Raise (Quadruped)    On hands and knees find neutral spine. Tighten pelvic floor and abdominals and hold. Alternately lift arm to shoulder level. Repeat 10 times. Do 2 times a day.   Copyright  VHI. All rights reserved.   Bracing With Leg Raise (Quadruped)    On hands and knees find neutral spine. Tighten pelvic floor and abdominals and hold. Alternating legs, straighten and lift to hip level. Repeat 10 times. Do 2 times a day.   Copyright  VHI. All rights reserved.

## 2017-01-13 NOTE — Therapy (Signed)
[place], [place], [address] Phone: [phone]   Fax:  [phone]  Physical Therapy Treatment  Patient Details  Name: Donna Woodward MRN: [id number] Date of Birth: 06/10/1956 Referring Provider: Raylene Everts, MD  Encounter Date: 01/13/2017      PT End of Session - 01/13/17 1407    Visit Number 13   Number of Visits 17   Date for PT Re-Evaluation 02/10/2017   Authorization Type Medicare Part A and B   Authorization Time Period January 13, 2017 to 2017/02/10 (g codes done on Aug 22, 2022 visit)   Authorization - Visit Number 13   Authorization - Number of Visits 17   PT Start Time 1350   PT Stop Time 1428   PT Time Calculation (min) 38 min   Activity Tolerance Patient limited by pain;Patient tolerated treatment well;No increased pain   Behavior During Therapy WFL for tasks assessed/performed      Past Medical History:  Diagnosis Date  . Anxiety   . Back pain   . Coronary artery disease   . Degenerative joint disease (DJD) of lumbar spine 03/24/2016  . Depression   . GERD (gastroesophageal reflux disease)    if overweight  . Hyperlipidemia   . Hypertension     Past Surgical History:  Procedure Laterality Date  . BACK SURGERY    . CARDIAC CATHETERIZATION N/A 06/05/2016   Procedure: Left Heart Cath and Coronary Angiography;  Surgeon: Belva Crome, MD;  Location: [facility];  Service: Cardiovascular;  Laterality: N/A;  . CORONARY ARTERY BYPASS GRAFT N/A 06/17/2016   Procedure: CORONARY ARTERY BYPASS GRAFTING (CABG), ON PUMP, TIMES THREE, USING LEFT INTERNAL MAMMARY ARTERY, RIGHT GREATER SAPHENOUS VEIN HARVESTED ENDOSCOPICALLY;  Surgeon: Ivin Poot, MD;  Location: [facility];  Service: Open Heart Surgery;  Laterality: N/A;  . MULTIPLE TOOTH EXTRACTIONS    . [place]   lamnectomy L5S1  . TEE WITHOUT CARDIOVERSION N/A 06/17/2016   Procedure: TRANSESOPHAGEAL ECHOCARDIOGRAM (TEE);  Surgeon: Ivin Poot, MD;  Location: [facility];  Service: Open Heart Surgery;  Laterality: N/A;    There were no vitals filed for this visit.      Subjective Assessment - 01/13/17 1353    Subjective Pt reports increased awareness and trying to present with improve posture.  Compliant with HEP daily and biofreeze application for pain.  Pain scale 7/10 mainly lower back Lt side and minimal radicular symptoms Lt thigh.     Pertinent History chronic LBP, CABG x 3 on 06/17/16,    Patient Stated Goals lower pain level,    Currently in Pain? Yes   Pain Score 7    Pain Location Back   Pain Orientation Lower;Left   Pain Descriptors / Indicators Aching;Sharp   Pain Type Chronic pain   Pain Onset More than a month ago   Pain Frequency Intermittent   Aggravating Factors  walking, standing, getting out of bed   Pain Relieving Factors nothing   Effect of Pain on Daily Activities increases                         [facility] Adult PT Treatment/Exercise - 01/13/17 0001      Lumbar Exercises: Standing   Heel Raises 20 reps   Heel Raises Limitations heel and toe   Forward Lunge 10 reps   Forward Lunge Limitations 6in step   Wall Slides 15 reps;3 seconds  Lumbar Exercises: Quadruped   Single Arm Raise 5 reps;3 seconds  cueing for ab set   Single Arm Raises Limitations 2# bar over back   Straight Leg Raise 3 seconds;5 reps  fatigue at rep 3   Straight Leg Raises Limitations 2# bar over back     Manual Therapy   Manual Therapy Soft tissue mobilization   Manual therapy comments completed separate rest of treatment   Soft tissue mobilization manual to iliopsoas in supine semirecumbent position                  PT Short Term Goals - 12/30/16 1309      PT SHORT TERM GOAL #1   Title Pt will be independent with HEP and perform consistently to maximize return to PLOF.   Time 2   Period Weeks   Status Achieved     PT SHORT TERM GOAL #2   Title Pt will have improved MMT to 4+/5  throughout BLE to demosntrate improved overall strength to maximize function and gait.   Baseline 7/3: hip ext and abd still the most deficient   Time 2   Period Weeks   Status Partially Met     PT SHORT TERM GOAL #3   Title Pt will report being able to wash dishes for 30 mins or > to demonstrate improved tolerance to standing in order maximize participation at home.   Baseline 7/3: 10 mins but she is holding on   Time 2   Period Weeks   Status On-going           PT Long Term Goals - 12/30/16 1310      PT LONG TERM GOAL #1   Title Pt will have improved 5xSTS to 15 sec or < to demonstrate improved overall BLE strength and power in order to maximize functional tasks at home and in the community.   Baseline 7/3: 25 sec   Time 4   Period Weeks   Status On-going     PT LONG TERM GOAL #2   Title Pt will have improved lumbar extension ROM by 50% (improve to moderate limitations or better) with minimal to no LBP and no reports of radicular pain to demonstrate improved function and decrease pain overall.    Baseline 7/3: pt mod limitation for extension this date   Time 4   Period Weeks   Status Achieved     PT LONG TERM GOAL #3   Title Pt will have improved TUG to 10 sec or < with LRAD in order to demonstrate improved overall function and maximzie participation at home and in the community.    Baseline 7/3: 20.6 sec with SPC   Time 4   Period Weeks   Status On-going               Plan - 01/13/17 1713    Clinical Impression Statement Pt arrived with positive feedback following last session with reports of pain lowest in awhile and minimal reports of radicular symptoms.  Pt presents with improve awareness of posture and less cueing required for form with task this session.  Added quadruped therex with cueing for form and stability with task to improve core and proximal strengthening.  Updated HEP with additional exercises given.  Continued with manual iliopsoas for pain relief  at EOS, no reports of increased LBP and no radicular symptoms through session.     Rehab Potential Fair   PT Frequency 2x / week   PT  Duration 4 weeks   PT Treatment/Interventions ADLs/Self Care Home Management;DME Instruction;Gait training;Stair training;Functional mobility training;Therapeutic activities;Therapeutic exercise;Balance training;Neuromuscular re-education;Patient/family education;Manual techniques;Passive range of motion;Energy conservation;Taping   PT Next Visit Plan F/U with pain control following 2nd iliopsoas manual.  Progress proximal hiop and functional strengthening.  Begin UE/LE quadruped when ready.    PT Home Exercise Plan eval: supine quad stretch; SKTC; 6/14: Pelvic tilt, hamstring and standing hip flexor stretch for Lt LE; 7/3: POE; 7/5: sidestepping with RTB; 7/17 quadruped UE and LE       Patient will benefit from skilled therapeutic intervention in order to improve the following deficits and impairments:  Abnormal gait, Cardiopulmonary status limiting activity, Decreased activity tolerance, Decreased balance, Decreased endurance, Decreased mobility, Decreased range of motion, Decreased strength, Difficulty walking, Hypomobility, Increased muscle spasms, Impaired flexibility, Impaired sensation, Improper body mechanics, Postural dysfunction, Obesity, Pain  Visit Diagnosis: Chronic bilateral low back pain without sciatica  Muscle weakness (generalized)  Other symptoms and signs involving the musculoskeletal system  Difficulty in walking, not elsewhere classified  Abnormal posture     Problem List Patient Active Problem List   Diagnosis Date Noted  . S/P CABG x 3 06/17/2016  . Family history of early CAD 06/04/2016  . Chest pain 06/04/2016  . Abnormal nuclear stress test 06/04/2016  . Degenerative joint disease (DJD) of lumbar spine 03/24/2016  . Depression (emotion) 03/24/2016  . Obesity 08/14/2015  . Essential hypertension, benign 04/29/2015  .  Hyperlipidemia 04/29/2015   Ihor Austin, [place]; [place]  Aldona Lento 01/13/2017, 5:18 PM  [facility] [address] [place], [place], [address] Phone: [phone]   Fax:  [phone]  Name: ALLAYA ABBASI MRN: [id number] Date of Birth: [birth year]

## 2017-01-15 ENCOUNTER — Ambulatory Visit (HOSPITAL_COMMUNITY): Payer: Medicare Other

## 2017-01-15 DIAGNOSIS — R293 Abnormal posture: Secondary | ICD-10-CM

## 2017-01-15 DIAGNOSIS — R262 Difficulty in walking, not elsewhere classified: Secondary | ICD-10-CM

## 2017-01-15 DIAGNOSIS — M6281 Muscle weakness (generalized): Secondary | ICD-10-CM | POA: Diagnosis not present

## 2017-01-15 DIAGNOSIS — M545 Low back pain: Secondary | ICD-10-CM | POA: Diagnosis not present

## 2017-01-15 DIAGNOSIS — G8929 Other chronic pain: Secondary | ICD-10-CM

## 2017-01-15 DIAGNOSIS — R29898 Other symptoms and signs involving the musculoskeletal system: Secondary | ICD-10-CM | POA: Diagnosis not present

## 2017-01-15 NOTE — Therapy (Signed)
[place], [place], [address] Phone: [phone]   Fax:  [phone]  Physical Therapy Treatment  Patient Details  Name: Donna Woodward MRN: [id number] Date of Birth: August 26, 1955 Referring Provider: Raylene Everts, MD  Encounter Date: 01/15/2017      PT End of Session - 01/15/17 1408    Visit Number 14   Number of Visits 17   Date for PT Re-Evaluation 02/14/2017   Authorization Type Medicare Part A and B   Authorization Time Period 01-17-17 to 02-14-2017 (g codes done on 2022/08/26 visit)   Authorization - Visit Number 14   Authorization - Number of Visits 17   PT Start Time 1350   PT Stop Time 1430   PT Time Calculation (min) 40 min   Activity Tolerance Patient limited by pain;Patient tolerated treatment well;No increased pain  no reports of increased pain through session, 7/10   Behavior During Therapy [facility] for tasks assessed/performed      Past Medical History:  Diagnosis Date  . Anxiety   . Back pain   . Coronary artery disease   . Degenerative joint disease (DJD) of lumbar spine 03/24/2016  . Depression   . GERD (gastroesophageal reflux disease)    if overweight  . Hyperlipidemia   . Hypertension     Past Surgical History:  Procedure Laterality Date  . BACK SURGERY    . CARDIAC CATHETERIZATION N/A 06/05/2016   Procedure: Left Heart Cath and Coronary Angiography;  Surgeon: Belva Crome, MD;  Location: [place] [facility];  Service: Cardiovascular;  Laterality: N/A;  . CORONARY ARTERY BYPASS GRAFT N/A 06/17/2016   Procedure: CORONARY ARTERY BYPASS GRAFTING (CABG), ON PUMP, TIMES THREE, USING LEFT INTERNAL MAMMARY ARTERY, RIGHT GREATER SAPHENOUS VEIN HARVESTED ENDOSCOPICALLY;  Surgeon: Ivin Poot, MD;  Location: [place];  Service: Open Heart Surgery;  Laterality: N/A;  . MULTIPLE TOOTH EXTRACTIONS    . [place]   lamnectomy L5S1  . TEE WITHOUT CARDIOVERSION N/A 06/17/2016   Procedure:  TRANSESOPHAGEAL ECHOCARDIOGRAM (TEE);  Surgeon: Ivin Poot, MD;  Location: [place];  Service: Open Heart Surgery;  Laterality: N/A;    There were no vitals filed for this visit.      Subjective Assessment - 01/15/17 1353    Subjective Pt reports LBP pain scale 7/10 with some tenderness Rt hip.  No reports of radicular symptoms this session.   Pertinent History chronic LBP, CABG x 3 on 06/17/16,    Patient Stated Goals lower pain level,    Currently in Pain? Yes   Pain Score 7    Pain Location Back   Pain Orientation Lower   Pain Descriptors / Indicators Aching;Throbbing   Pain Type Chronic pain   Pain Onset More than a month ago   Pain Frequency Intermittent   Aggravating Factors  walking, standing, getting out of bed   Pain Relieving Factors nothing   Effect of Pain on Daily Activities increases                         [facility] Adult PT Treatment/Exercise - 01/15/17 0001      Lumbar Exercises: Stretches   Quadruped Mid Back Stretch Limitations Child's pose 2x 30"     Lumbar Exercises: Standing   Heel Raises 20 reps   Heel Raises Limitations heel and toe   Functional Squats 10 reps   Functional Squats Limitations chair behind for  form   Forward Lunge 15 reps   Forward Lunge Limitations 4in step   Wall Slides 15 reps;3 seconds   Other Standing Lumbar Exercises SlS Rt 35" Lt 50"     Lumbar Exercises: Prone   Straight Leg Raise 10 reps;5 seconds   Other Prone Lumbar Exercises plank 3x 5"     Lumbar Exercises: Quadruped   Straight Leg Raise 5 reps  reports Lt wrist pain, following reports progressed to prone     Manual Therapy   Manual Therapy Soft tissue mobilization   Manual therapy comments completed separate rest of treatment   Soft tissue mobilization manual to iliopsoas in supine semirecumbent position                  PT Short Term Goals - 12/30/16 1309      PT SHORT TERM GOAL #1   Title Pt will be independent with HEP and perform  consistently to maximize return to PLOF.   Time 2   Period Weeks   Status Achieved     PT SHORT TERM GOAL #2   Title Pt will have improved MMT to 4+/5 throughout BLE to demosntrate improved overall strength to maximize function and gait.   Baseline 7/3: hip ext and abd still the most deficient   Time 2   Period Weeks   Status Partially Met     PT SHORT TERM GOAL #3   Title Pt will report being able to wash dishes for 30 mins or > to demonstrate improved tolerance to standing in order maximize participation at home.   Baseline 7/3: 10 mins but she is holding on   Time 2   Period Weeks   Status On-going           PT Long Term Goals - 12/30/16 1310      PT LONG TERM GOAL #1   Title Pt will have improved 5xSTS to 15 sec or < to demonstrate improved overall BLE strength and power in order to maximize functional tasks at home and in the community.   Baseline 7/3: 25 sec   Time 4   Period Weeks   Status On-going     PT LONG TERM GOAL #2   Title Pt will have improved lumbar extension ROM by 50% (improve to moderate limitations or better) with minimal to no LBP and no reports of radicular pain to demonstrate improved function and decrease pain overall.    Baseline 7/3: pt mod limitation for extension this date   Time 4   Period Weeks   Status Achieved     PT LONG TERM GOAL #3   Title Pt will have improved TUG to 10 sec or < with LRAD in order to demonstrate improved overall function and maximzie participation at home and in the community.    Baseline 7/3: 20.6 sec with SPC   Time 4   Period Weeks   Status On-going               Plan - 01/15/17 1901    Clinical Impression Statement Pt arrived with vast improvements with posture awareness and reports no radicular symptoms Lt LE for almost a week.  Does continue to c/o high pain scale 7/10 in lower back.  Continued session focus with functional strengtheing, vast improvements noted with decreased cueing required for form  with therex.  Continued with manual to iliopsoas for pain control following reports of relief.     Rehab Potential Fair  PT Frequency 2x / week   PT Duration 4 weeks   PT Treatment/Interventions ADLs/Self Care Home Management;DME Instruction;Gait training;Stair training;Functional mobility training;Therapeutic activities;Therapeutic exercise;Balance training;Neuromuscular re-education;Patient/family education;Manual techniques;Passive range of motion;Energy conservation;Taping   PT Next Visit Plan Progress proximal hiop and functional strengthening.  Begin UE/LE quadruped when ready (if able to tolerate quadruped position Lt wrist)   PT Home Exercise Plan eval: supine quad stretch; SKTC; 6/14: Pelvic tilt, hamstring and standing hip flexor stretch for Lt LE; 7/3: POE; 7/5: sidestepping with RTB; 7/17 quadruped UE and LE       Patient will benefit from skilled therapeutic intervention in order to improve the following deficits and impairments:  Abnormal gait, Cardiopulmonary status limiting activity, Decreased activity tolerance, Decreased balance, Decreased endurance, Decreased mobility, Decreased range of motion, Decreased strength, Difficulty walking, Hypomobility, Increased muscle spasms, Impaired flexibility, Impaired sensation, Improper body mechanics, Postural dysfunction, Obesity, Pain  Visit Diagnosis: Chronic bilateral low back pain without sciatica  Muscle weakness (generalized)  Other symptoms and signs involving the musculoskeletal system  Difficulty in walking, not elsewhere classified  Abnormal posture     Problem List Patient Active Problem List   Diagnosis Date Noted  . S/P CABG x 3 06/17/2016  . Family history of early CAD 06/04/2016  . Chest pain 06/04/2016  . Abnormal nuclear stress test 06/04/2016  . Degenerative joint disease (DJD) of lumbar spine 03/24/2016  . Depression (emotion) 03/24/2016  . Obesity 08/14/2015  . Essential hypertension, benign 04/29/2015   . Hyperlipidemia 04/29/2015   Ihor Austin, [place]; [place]  Aldona Lento 01/15/2017, 7:05 PM  [place], [place], [address] Phone: [phone]   Fax:  [phone]  Name: Donna Woodward MRN: [id number] Date of Birth: 1955-11-21

## 2017-01-19 ENCOUNTER — Encounter (HOSPITAL_COMMUNITY): Payer: Self-pay

## 2017-01-19 ENCOUNTER — Ambulatory Visit (HOSPITAL_COMMUNITY): Payer: Medicare Other

## 2017-01-19 DIAGNOSIS — M545 Low back pain: Secondary | ICD-10-CM | POA: Diagnosis not present

## 2017-01-19 DIAGNOSIS — R293 Abnormal posture: Secondary | ICD-10-CM | POA: Diagnosis not present

## 2017-01-19 DIAGNOSIS — G8929 Other chronic pain: Secondary | ICD-10-CM

## 2017-01-19 DIAGNOSIS — R262 Difficulty in walking, not elsewhere classified: Secondary | ICD-10-CM | POA: Diagnosis not present

## 2017-01-19 DIAGNOSIS — R29898 Other symptoms and signs involving the musculoskeletal system: Secondary | ICD-10-CM

## 2017-01-19 DIAGNOSIS — M6281 Muscle weakness (generalized): Secondary | ICD-10-CM | POA: Diagnosis not present

## 2017-01-19 NOTE — Therapy (Signed)
[facility] [place], [place], [address] Phone: [phone]   Fax:  [phone]  Physical Therapy Treatment/Discharge Summary  Patient Details  Name: NEALA MIGGINS MRN: [id number] Date of Birth: 1955/08/29 Referring Provider: Raylene Everts, MD  Encounter Date: 01/19/2017      PT End of Session - 01/19/17 1338    Visit Number 15   Number of Visits 17   Date for PT Re-Evaluation 01-28-17   Authorization Type Medicare Part A and B   Authorization Time Period December 31, 2016 to 01/28/2017 (g codes done on 08-09-22 visit)   Authorization - Visit Number 15   Authorization - Number of Visits 17   PT Start Time 1341   PT Stop Time 1415   PT Time Calculation (min) 34 min   Activity Tolerance Patient limited by pain;Patient tolerated treatment well;No increased pain  no reports of increased pain through session, 7/10   Behavior During Therapy [facility] for tasks assessed/performed      Past Medical History:  Diagnosis Date  . Anxiety   . Back pain   . Coronary artery disease   . Degenerative joint disease (DJD) of lumbar spine 03/24/2016  . Depression   . GERD (gastroesophageal reflux disease)    if overweight  . Hyperlipidemia   . Hypertension     Past Surgical History:  Procedure Laterality Date  . BACK SURGERY    . CARDIAC CATHETERIZATION N/A 06/05/2016   Procedure: Left Heart Cath and Coronary Angiography;  Surgeon: Belva Crome, MD;  Location: [place] [facility];  Service: Cardiovascular;  Laterality: N/A;  . CORONARY ARTERY BYPASS GRAFT N/A 06/17/2016   Procedure: CORONARY ARTERY BYPASS GRAFTING (CABG), ON PUMP, TIMES THREE, USING LEFT INTERNAL MAMMARY ARTERY, RIGHT GREATER SAPHENOUS VEIN HARVESTED ENDOSCOPICALLY;  Surgeon: Ivin Poot, MD;  Location: [place];  Service: Open Heart Surgery;  Laterality: N/A;  . MULTIPLE TOOTH EXTRACTIONS    . [place]   lamnectomy L5S1  . TEE WITHOUT CARDIOVERSION N/A 06/17/2016    Procedure: TRANSESOPHAGEAL ECHOCARDIOGRAM (TEE);  Surgeon: Ivin Poot, MD;  Location: [place];  Service: Open Heart Surgery;  Laterality: N/A;    There were no vitals filed for this visit.      Subjective Assessment - 01/19/17 1343    Subjective Pt states that she still has LBP, but still no reports of radicular symptoms.    Pertinent History chronic LBP, CABG x 3 on 06/17/16,    Patient Stated Goals lower pain level,    Currently in Pain? Yes   Pain Score 6    Pain Location Back   Pain Orientation Lower   Pain Descriptors / Indicators Aching;Throbbing   Pain Type Chronic pain   Pain Onset More than a month ago   Pain Frequency Intermittent   Aggravating Factors  walking, standing, getting out of bed   Pain Relieving Factors nothing   Effect of Pain on Daily Activities increases               [facility] PT Assessment - 01/19/17 0001      Strength   Right Hip Extension 3+/5  was 3   Right Hip ABduction 4+/5  was 4   Left Hip Extension 4+/5  was 2+   Left Hip ABduction 4+/5  was 4+   Left Ankle Dorsiflexion 5/5  was 4+              PT Education - 01/19/17  1416    Education provided Yes   Education Details continue current HEP and added new exercises to HEP; sitting with lumbar roll   Person(s) Educated Patient   Methods Explanation;Demonstration;Handout   Comprehension Verbalized understanding;Returned demonstration          PT Short Term Goals - 01/19/17 1348      PT SHORT TERM GOAL #1   Title Pt will be independent with HEP and perform consistently to maximize return to PLOF.   Time 2   Period Weeks   Status Achieved     PT SHORT TERM GOAL #2   Title Pt will have improved MMT to 4+/5 throughout BLE to demosntrate improved overall strength to maximize function and gait.   Baseline 7/23: R hip ext 3+/5, everything else was 4+/5   Time 2   Period Weeks   Status Partially Met     PT SHORT TERM GOAL #3   Title Pt will report being able to wash  dishes for 30 mins or > to demonstrate improved tolerance to standing in order maximize participation at home.   Baseline 7/23: she can stand for 30 mins but has to lean, but is able to do it   Time 2   Period Weeks   Status Partially Met           PT Long Term Goals - 01/19/17 1349      PT LONG TERM GOAL #1   Title Pt will have improved 5xSTS to 15 sec or < to demonstrate improved overall BLE strength and power in order to maximize functional tasks at home and in the community.   Baseline 7/23: 19 sec first trial, 15 sec second trial   Time 4   Period Weeks   Status Achieved     PT LONG TERM GOAL #2   Title Pt will have improved lumbar extension ROM by 50% (improve to moderate limitations or better) with minimal to no LBP and no reports of radicular pain to demonstrate improved function and decrease pain overall.    Baseline 7/3: pt mod limitation for extension this date   Time 4   Period Weeks   Status Achieved     PT LONG TERM GOAL #3   Title Pt will have improved TUG to 10 sec or < with LRAD in order to demonstrate improved overall function and maximzie participation at home and in the community.    Baseline 7/23: 16 sec with SPC first trial, 13 sec with SPC second trial   Time 4   Period Weeks   Status On-going               Plan - 01/19/17 1416    Clinical Impression Statement Pt arrived to therapy again this date with no reports of radicular symptoms and only LBP. PT mentioned that she was up for reassessment soon and she wished to go ahead and have her goals checked today and PT agreed that she was appropriate for reassessment this date. Pt has made significant improvements since her last assessment as evidenced by her MMT, 5xSTS, and TUG times compared to initial evaluation. Pt has met 3, partially met 2 goals and 1 is still on-going. Pt verbalized that she has improved 100% with regards to her LLE radicular symptoms but she still has back pain which she states  she will always have LBP. When asked if she can do everything she needs or wants to do at home or in the community she  states "I make it work." Due to progress made, pt will be discharged to her HEP. PT provided pt with updated functional hip strengthening and re-educated pt on proper sitting with a lumbar roll. Pt is ready for discharge at this time and she educated she can return with a referral if she notices a significant decline in her function and she verbalized understanding.    Rehab Potential Fair   PT Frequency 2x / week   PT Duration 4 weeks   PT Treatment/Interventions ADLs/Self Care Home Management;DME Instruction;Gait training;Stair training;Functional mobility training;Therapeutic activities;Therapeutic exercise;Balance training;Neuromuscular re-education;Patient/family education;Manual techniques;Passive range of motion;Energy conservation;Taping   PT Next Visit Plan Progress proximal hiop and functional strengthening.  Begin UE/LE quadruped when ready (if able to tolerate quadruped position Lt wrist)   PT Home Exercise Plan eval: supine quad stretch; SKTC; 6/14: Pelvic tilt, hamstring and standing hip flexor stretch for Lt LE; 7/3: POE; 7/5: sidestepping with RTB; 7/17 quadruped UE and LE; Feb 05, 2023: fwd and lat step ups, mini squats, wall slides, continue bridging, pelvic tilts, proper sitting with lumbar roll   Consulted and Agree with Plan of Care Patient      Patient will benefit from skilled therapeutic intervention in order to improve the following deficits and impairments:  Abnormal gait, Cardiopulmonary status limiting activity, Decreased activity tolerance, Decreased balance, Decreased endurance, Decreased mobility, Decreased range of motion, Decreased strength, Difficulty walking, Hypomobility, Increased muscle spasms, Impaired flexibility, Impaired sensation, Improper body mechanics, Postural dysfunction, Obesity, Pain  Visit Diagnosis: Chronic bilateral low back pain without  sciatica  Muscle weakness (generalized)  Other symptoms and signs involving the musculoskeletal system  Difficulty in walking, not elsewhere classified  Abnormal posture       G-Codes - 02-04-17 1422    Functional Assessment Tool Used (Outpatient Only) clinical judgement, FOTO, 5xSTS   Functional Limitation Mobility: Walking and moving around   Mobility: Walking and Moving Around Current Status (B2010) At least 20 percent but less than 40 percent impaired, limited or restricted   Mobility: Walking and Moving Around Goal Status [phone]) At least 20 percent but less than 40 percent impaired, limited or restricted   Mobility: Walking and Moving Around Discharge Status [phone]) At least 20 percent but less than 40 percent impaired, limited or restricted      Problem List Patient Active Problem List   Diagnosis Date Noted  . S/P CABG x 3 06/17/2016  . Family history of early CAD 06/04/2016  . Chest pain 06/04/2016  . Abnormal nuclear stress test 06/04/2016  . Degenerative joint disease (DJD) of lumbar spine 03/24/2016  . Depression (emotion) 03/24/2016  . Obesity 08/14/2015  . Essential hypertension, benign 04/29/2015  . Hyperlipidemia 04/29/2015     Geraldine Solar PT, DPT  [facility] [address] [place], [place], [address] Phone: [phone]   Fax:  [phone]  Name: TERRACE CHIEM MRN: [id number] Date of Birth: December 29, 1955

## 2017-01-19 NOTE — Patient Instructions (Signed)
  Step Ups  Start by facing the step. Step up with the affected leg, followed by the unaffected leg. Step back down with the unaffected leg, followed by the affected leg.   Lateral Step Ups  Step up sideways to a 6-8 inch step. Step down with the opposite leg. Do 2 sets of 10 and then repeat on the opposite side.   Mini Squats  stand with your feet hip-width apart, keep a chair behind you for form  Slowly bend your knees as far as is comfortable, keeping them facing forwards. Keep your back straight at all times.   Gently come up to standing, squeezing your buttocks as you do so.   WALL SQUATS  Leaning up against a wall or closed door on your back, slide your body downward and then return back to upright position.  A door was used here because it was smoother and had less friction than the wall.   Knees should bend in line with the 2nd toe and not pass the front of the foot.   Perform at least 3-5 exercises 1x/day, about 3 sets of 10-15 reps each. If these get easy, you can always increase the number of reps, or hold it for a few seconds, or both (increase reps and hold)

## 2017-01-21 ENCOUNTER — Ambulatory Visit (HOSPITAL_COMMUNITY): Payer: Medicare Other

## 2017-01-27 NOTE — Progress Notes (Signed)
Cardiology Office Note    Date:  01/30/2017   ID:  Yesenia, Locurto May 05, 1956, MRN [id number]  PCP:  Raylene Everts, MD  Cardiologist:  Dr. Johnsie Cancel  Chief Complaint: Hospital follow up s/p CABG  History of Present Illness:   Donna Woodward is a 61 y.o. female with HTN, HLD, previous smoker and  CABG presents for follow up.   Seen by me  for initial consult for chest pain 05/07/16. Nuclear stress test 05/20/16 showed no diagnostic ST segment abnormalities, rare PVCs, moderate sized, moderate intensity, reversible inferolateral defect from the apex to the base with partial reversibility at the very base suggestive of ischemia. It was considered an intermediate risk study LVEF 69%.  F/u Cath 06/05/16 showed 3V disease s/p CABG x 3 (LIMA to LAD; SVG to posterior descending; SVG to diagonal). Post op Had some volume overload and ventricular ectopy Amiodarone d/c and Toprol increased   Holter monitor reviewed:  NSR rates 51-119 PVC;s 7/hr some short bursts NSVT 5 beats and PSVT same 5-6 beats   Past Medical History:  Diagnosis Date  . Anxiety   . Back pain   . Coronary artery disease   . Degenerative joint disease (DJD) of lumbar spine 03/24/2016  . Depression   . GERD (gastroesophageal reflux disease)    if overweight  . Hyperlipidemia   . Hypertension     Past Surgical History:  Procedure Laterality Date  . BACK SURGERY    . CARDIAC CATHETERIZATION N/A 06/05/2016   Procedure: Left Heart Cath and Coronary Angiography;  Surgeon: Belva Crome, MD;  Location: [place] [facility];  Service: Cardiovascular;  Laterality: N/A;  . CORONARY ARTERY BYPASS GRAFT N/A 06/17/2016   Procedure: CORONARY ARTERY BYPASS GRAFTING (CABG), ON PUMP, TIMES THREE, USING LEFT INTERNAL MAMMARY ARTERY, RIGHT GREATER SAPHENOUS VEIN HARVESTED ENDOSCOPICALLY;  Surgeon: Ivin Poot, MD;  Location: [place];  Service: Open Heart Surgery;  Laterality: N/A;  . MULTIPLE TOOTH EXTRACTIONS    . [place]   lamnectomy L5S1  . TEE WITHOUT CARDIOVERSION N/A 06/17/2016   Procedure: TRANSESOPHAGEAL ECHOCARDIOGRAM (TEE);  Surgeon: Ivin Poot, MD;  Location: [place];  Service: Open Heart Surgery;  Laterality: N/A;    Current Medications: Prior to Admission medications   Medication Sig Start Date End Date Taking? Authorizing Provider  amiodarone (PACERONE) 200 MG tablet Take 1 tablet (200 mg total) by mouth 2 (two) times daily. For one week;then take Amiodarone 200 mg daily by mouth thereafter 06/24/16   Nani Skillern, PA-C  aspirin EC 325 MG EC tablet Take 1 tablet (325 mg total) by mouth daily. 06/24/16   Donielle Liston Alba, PA-C  guaiFENesin (MUCINEX) 600 MG 12 hr tablet Take 1 tablet (600 mg total) by mouth 2 (two) times daily as needed for cough. 06/24/16   Donielle Liston Alba, PA-C  lisinopril-hydrochlorothiazide (PRINZIDE,ZESTORETIC) 20-25 MG tablet Take 1 tablet by mouth daily. Patient taking differently: Take 1 tablet by mouth daily. Taken in the morning 03/24/16   Raylene Everts, MD  metoprolol succinate (TOPROL-XL) 50 MG 24 hr tablet Take 1 tablet (50 mg total) by mouth daily. Take with or immediately following a meal. Patient taking differently: Take 50 mg by mouth. Take with or immediately following a meal; in the morning 03/24/16   Raylene Everts, MD  omega-3 fish oil (MAXEPA) 1000 MG CAPS capsule Take 1 capsule (1,000 mg total) by mouth 2 (two) times daily. Reported on  11/14/2015 03/24/16   Raylene Everts, MD  potassium chloride SA (K-DUR,KLOR-CON) 20 MEQ tablet Take 1 tablet (20 mEq total) by mouth daily. Patient taking differently: Take 20 mEq by mouth daily. Taken at night 06/04/16   Imogene Burn, PA-C  simvastatin (ZOCOR) 20 MG tablet Take 1 tablet (20 mg total) by mouth at bedtime. 03/24/16   Raylene Everts, MD  traMADol (ULTRAM) 50 MG tablet Take 1 tablet (50 mg total) by mouth every 4 (four) hours as needed for moderate pain. 06/24/16   Donielle Liston Alba, PA-C    Allergies:   Patient has no known allergies.   Social History   Social History  . Marital status: Single    Spouse name: N/A  . Number of children: N/A  . Years of education: 14   Occupational History  . disabled     warehouse   Social History Main Topics  . Smoking status: Former Smoker    Quit date: 06/30/2006  . Smokeless tobacco: Never Used  . Alcohol use Yes     Comment: rare  . Drug use: Yes    Types: Marijuana  . Sexual activity: Not Currently   Other Topics Concern  . None   Social History Narrative   Masters in Designer, television/film set alone   Disabled with back pain     Family History:  The patient's family history includes CAD (age of onset: 72) in her sister; Cancer (age of onset: 14) in her sister; Dementia in her father; Heart disease in her father; Hypertension in her mother, sister, sister, and sister; Stroke in her father.   ROS:   Please see the history of present illness.    ROS All other systems reviewed and are negative.   PHYSICAL EXAM:   VS:  BP 132/80   Pulse 64   Ht 5\' 5"  (1.651 m)   Wt 168 lb 6.4 oz (76.4 kg)   SpO2 97%   BMI 28.02 kg/m    Affect appropriate Healthy:  appears stated age 42: normal Neck supple with no adenopathy JVP normal no bruits no thyromegaly Lungs clear with no wheezing and good diaphragmatic motion Heart:  S1/S2 no murmur, no rub, gallop or click PMI normal post sternotomy  Abdomen: benighn, BS positve, no tenderness, no AAA no bruit.  No HSM or HJR Distal pulses intact with no bruits No edema Neuro non-focal Skin warm and dry No muscular weakness Venectomy scars RU thigh and calf and L calf healed well Multiple skin tatoos    Wt Readings from Last 3 Encounters:  01/30/17 168 lb 6.4 oz (76.4 kg)  10/29/16 172 lb 12.8 oz (78.4 kg)  10/23/16 175 lb (79.4 kg)      Studies/Labs Reviewed:   EKG:   NSR rate 62 normal 06/18/16   Recent Labs: 06/18/2016: Magnesium  2.1 10/23/2016: ALT 15; BUN 13; Creat 0.66; Hemoglobin 13.1; Platelets 235; Potassium 3.5; Sodium 141   Lipid Panel    Component Value Date/Time   CHOL 150 10/23/2016 1450   TRIG 163 (H) 10/23/2016 1450   HDL 26 (L) 10/23/2016 1450   CHOLHDL 5.8 (H) 10/23/2016 1450   VLDL 33 (H) 10/23/2016 1450   LDLCALC 91 10/23/2016 1450    Additional studies/ records that were reviewed today include:   TEE 06/17/16 Result status: Final result   Left ventricle: Normal cavity size and wall thickness. LV systolic function is low normal with an EF of 50-55%. No thrombus present.  No mass present.  Septum: No Patent Foramen Ovale present. The interatrial septum bows to the left consistent with high right atrial pressure.  Left atrium: Patent foramen ovale not present.  Aortic valve: The valve is trileaflet. Mild valve calcification present. No stenosis. No stenosis by continuous wave doppler Trace regurgitation.  Mitral valve: Mild leaflet thickening is present. Trace regurgitation.  Right ventricle: Normal cavity size and ejection fraction. There is mild hypertrophy.  Tricuspid valve: Mild regurgitation. The tricuspid valve regurgitation jet is central.     Cardiac Catheterization:  06/05/16 Left Heart Cath and Coronary Angiography  Conclusion    Severe calcific, diffuse three-vessel coronary disease.  Codominant LAD/diagonal system with severe diffuse disease in each vessel with graftable distal targets.  Eccentric 30-60% ostial left main depending upon review.  99% proximal to mid RCA. Total occlusion at the ostium of a large PDA that fills left-to-right collaterals.  Moderate mid circumflex disease but without graftable targets beyond the stenosis.  Normal left ventricular systolic function with normal hemodynamics   RECOMMENDATIONS:   Continue current medical regimen.  Expedient evaluation by [facility] for multivessel coronary bypass grafting.       ASSESSMENT & PLAN:     1. CAD s/p CABG x 3 06/05/16  - No anginal pain. Continue ASA and Statin and beta blocker   2. HTN - Well controlled.  Continue current medications and low sodium Dash type diet.    3. HLD - 10/23/2016: Cholesterol 150; HDL 26; LDL Cholesterol 91; Triglycerides 163; VLDL 33  - Continue statin. Due for LFT and lipid panel  --> she will f/u with PCP.   4. PVC;s  - not that frequent and asymptomatic on Toprol   F/U with me in  A year   Jenkins Rouge Patient ID: Donna Woodward, female   DOB: 02-16-1956, 61 y.o.   MRN: [id number]

## 2017-01-30 ENCOUNTER — Ambulatory Visit: Payer: Medicare Other | Admitting: Cardiovascular Disease

## 2017-01-30 ENCOUNTER — Encounter: Payer: Self-pay | Admitting: Cardiovascular Disease

## 2017-01-30 ENCOUNTER — Ambulatory Visit (INDEPENDENT_AMBULATORY_CARE_PROVIDER_SITE_OTHER): Payer: Medicare Other | Admitting: Cardiovascular Disease

## 2017-01-30 VITALS — BP 132/80 | HR 64 | Ht 65.0 in | Wt 168.4 lb

## 2017-01-30 DIAGNOSIS — I2510 Atherosclerotic heart disease of native coronary artery without angina pectoris: Secondary | ICD-10-CM | POA: Diagnosis not present

## 2017-01-30 DIAGNOSIS — Z951 Presence of aortocoronary bypass graft: Secondary | ICD-10-CM | POA: Diagnosis not present

## 2017-01-30 NOTE — Patient Instructions (Addendum)

## 2017-04-03 ENCOUNTER — Encounter: Payer: Self-pay | Admitting: Family Medicine

## 2017-04-24 ENCOUNTER — Encounter: Payer: Self-pay | Admitting: Family Medicine

## 2017-04-24 ENCOUNTER — Ambulatory Visit (INDEPENDENT_AMBULATORY_CARE_PROVIDER_SITE_OTHER): Payer: Medicare Other | Admitting: Family Medicine

## 2017-04-24 VITALS — BP 132/80 | HR 68 | Temp 97.9°F | Resp 18 | Ht 65.0 in | Wt 167.1 lb

## 2017-04-24 DIAGNOSIS — Z1231 Encounter for screening mammogram for malignant neoplasm of breast: Secondary | ICD-10-CM | POA: Diagnosis not present

## 2017-04-24 DIAGNOSIS — Z780 Asymptomatic menopausal state: Secondary | ICD-10-CM | POA: Diagnosis not present

## 2017-04-24 DIAGNOSIS — M47816 Spondylosis without myelopathy or radiculopathy, lumbar region: Secondary | ICD-10-CM

## 2017-04-24 DIAGNOSIS — Z1239 Encounter for other screening for malignant neoplasm of breast: Secondary | ICD-10-CM

## 2017-04-24 DIAGNOSIS — I10 Essential (primary) hypertension: Secondary | ICD-10-CM | POA: Diagnosis not present

## 2017-04-24 DIAGNOSIS — E785 Hyperlipidemia, unspecified: Secondary | ICD-10-CM | POA: Diagnosis not present

## 2017-04-24 NOTE — Patient Instructions (Signed)
You are doing well Need labs in Jan I have ordered mammogram and dexa scan I will send you a letter with your test results.  If there is anything of concern, we will call right away. See me in 6 months Call sooner for problems

## 2017-04-24 NOTE — Progress Notes (Signed)
Chief Complaint  Patient presents with  . Follow-up    6 month  Patient is here for routine follow-up. Her blood pressure is well controlled. She is compliant with her medications. She has no chest pain dyspnea on exertion or pedal edema.  No symptoms of cardiac disease.  Is doing well after her bypass She has a good appetite.  No problems with her bowels or digestion. She is overdue for a mammogram.  She is never had a DEXA scan.  She went through menopause at 11.  She is recommended to have both a mammogram and DEXA scan this fall She refuses flu shot and pneumonia shot (again) She is having some difficulty feeling lonely.  She wishes she had not moved to [place] and would like to go back to her home in [place].  She has family in [place].  She is tearful today.  She does not feel like she needs counseling or medication. She has chronic low back pain.  She is attended physical therapy.  She continues to do her home exercises.  She takes over-the-counter medicine as needed.  Her back flares up every time she tries to do lifting, cleaning or activities in her home.   Patient Active Problem List   Diagnosis Date Noted  . S/P CABG x 3 06/17/2016  . Family history of early CAD 06/04/2016  . Chest pain 06/04/2016  . Abnormal nuclear stress test 06/04/2016  . Degenerative joint disease (DJD) of lumbar spine 03/24/2016  . Depression (emotion) 03/24/2016  . Obesity 08/14/2015  . Essential hypertension, benign 04/29/2015  . Hyperlipidemia 04/29/2015    Outpatient Encounter Prescriptions as of 04/24/2017  Medication Sig  . aspirin EC 325 MG EC tablet Take 1 tablet (325 mg total) by mouth daily.  Marland Kitchen lisinopril-hydrochlorothiazide (PRINZIDE,ZESTORETIC) 20-25 MG tablet TAKE ONE TABLET BY MOUTH ONCE DAILY  . metoprolol succinate (TOPROL-XL) 100 MG 24 hr tablet Take 1 tablet (100 mg total) by mouth daily. Take with or immediately following a meal.  . omega-3 fish oil (MAXEPA)  1000 MG CAPS capsule Take 1 capsule (1,000 mg total) by mouth 2 (two) times daily. Reported on 11/14/2015  . simvastatin (ZOCOR) 40 MG tablet Take 1 tablet (40 mg total) by mouth daily.   No facility-administered encounter medications on file as of 04/24/2017.     No Known Allergies  Review of Systems  Constitutional: Negative for activity change, appetite change and unexpected weight change.  HENT: Negative for congestion, dental problem, postnasal drip and rhinorrhea.   Eyes: Negative for redness and visual disturbance.  Respiratory: Negative for cough and shortness of breath.   Cardiovascular: Negative for chest pain, palpitations and leg swelling.  Gastrointestinal: Negative for abdominal pain, constipation and diarrhea.  Genitourinary: Negative for difficulty urinating and frequency.  Musculoskeletal: Negative for arthralgias and back pain.  Neurological: Negative for dizziness and headaches.  Psychiatric/Behavioral: Positive for decreased concentration and dysphoric mood. Negative for sleep disturbance. The patient is not nervous/anxious.     BP 132/80   Pulse 68   Temp 97.9 F (36.6 C) (Temporal)   Resp 18   Ht 5\' 5"  (1.651 m)   Wt 167 lb 1.3 oz (75.8 kg)   SpO2 97%   BMI 27.80 kg/m   Physical Exam  Constitutional: She is oriented to person, place, and time. She appears well-developed and well-nourished.  HENT:  Head: Normocephalic and atraumatic.  Mouth/Throat: Oropharynx is clear and moist.  Eyes: Pupils are equal, round, and reactive  to light. Conjunctivae are normal.  Neck: Normal range of motion. Neck supple. No thyromegaly present.  Cardiovascular: Normal rate, regular rhythm and normal heart sounds.   Well healed sternotomy scar  Pulmonary/Chest: Effort normal and breath sounds normal. No respiratory distress.  Abdominal: Soft. Bowel sounds are normal.  Musculoskeletal: Normal range of motion. She exhibits no edema.  Lymphadenopathy:    She has no cervical  adenopathy.  Neurological: She is alert and oriented to person, place, and time.  Gait normal  Skin: Skin is warm and dry.  Psychiatric: She has a normal mood and affect. Her behavior is normal. Thought content normal.  Quiet.  Tearful at times  Nursing note and vitals reviewed.   ASSESSMENT/PLAN:  1. Essential hypertension, benign Controlled  2. Spondylosis of lumbar region without myelopathy or radiculopathy Chronic low back pain treated conservatively  3. Hyperlipidemia, unspecified hyperlipidemia type On statin.  Due for lipid panel  4. Post-menopause Ordered - DG Bone Density; Future  5. Screening for breast cancer Ordered - MM Digital Screening; Future   Patient Instructions  You are doing well Need labs in Jan I have ordered mammogram and dexa scan I will send you a letter with your test results.  If there is anything of concern, we will call right away. See me in 6 months Call sooner for problems     Raylene Everts, MD

## 2017-04-30 ENCOUNTER — Other Ambulatory Visit (HOSPITAL_COMMUNITY): Payer: Medicare Other

## 2017-04-30 ENCOUNTER — Ambulatory Visit (HOSPITAL_COMMUNITY): Payer: Medicare Other

## 2017-05-07 ENCOUNTER — Encounter: Payer: Self-pay | Admitting: Family Medicine

## 2017-05-07 ENCOUNTER — Ambulatory Visit (HOSPITAL_COMMUNITY)
Admission: RE | Admit: 2017-05-07 | Discharge: 2017-05-07 | Disposition: A | Discharge status: Home / Self Care | Payer: Medicare Other | Source: Ambulatory Visit | Attending: Family Medicine | Admitting: Family Medicine

## 2017-05-07 DIAGNOSIS — Z13820 Encounter for screening for osteoporosis: Secondary | ICD-10-CM | POA: Insufficient documentation

## 2017-05-07 DIAGNOSIS — Z780 Asymptomatic menopausal state: Secondary | ICD-10-CM | POA: Diagnosis present

## 2017-05-07 DIAGNOSIS — Z1231 Encounter for screening mammogram for malignant neoplasm of breast: Secondary | ICD-10-CM | POA: Insufficient documentation

## 2017-05-07 DIAGNOSIS — M85852 Other specified disorders of bone density and structure, left thigh: Secondary | ICD-10-CM | POA: Diagnosis not present

## 2017-05-07 DIAGNOSIS — Z1239 Encounter for other screening for malignant neoplasm of breast: Secondary | ICD-10-CM

## 2017-05-11 DIAGNOSIS — D485 Neoplasm of uncertain behavior of skin: Secondary | ICD-10-CM | POA: Diagnosis not present

## 2017-05-31 ENCOUNTER — Other Ambulatory Visit: Payer: Self-pay | Admitting: Family Medicine

## 2017-06-01 NOTE — Telephone Encounter (Signed)
Seen 10 26 18 

## 2017-07-08 ENCOUNTER — Encounter: Payer: Self-pay | Admitting: Family Medicine

## 2017-08-28 ENCOUNTER — Encounter: Payer: Self-pay | Admitting: Cardiovascular Disease

## 2017-08-28 ENCOUNTER — Encounter: Payer: Self-pay | Admitting: Family Medicine

## 2017-10-09 ENCOUNTER — Encounter: Payer: Self-pay | Admitting: Family Medicine

## 2017-10-23 ENCOUNTER — Ambulatory Visit: Payer: Medicare Other | Admitting: Family Medicine

## 2017-11-06 ENCOUNTER — Encounter: Payer: Self-pay | Admitting: Family Medicine

## 2017-11-06 ENCOUNTER — Other Ambulatory Visit: Payer: Self-pay

## 2017-11-06 ENCOUNTER — Ambulatory Visit (INDEPENDENT_AMBULATORY_CARE_PROVIDER_SITE_OTHER): Payer: Medicare HMO | Admitting: Family Medicine

## 2017-11-06 VITALS — BP 114/62 | HR 68 | Temp 97.7°F | Resp 18 | Ht 64.0 in | Wt 157.0 lb

## 2017-11-06 DIAGNOSIS — R5383 Other fatigue: Secondary | ICD-10-CM | POA: Diagnosis not present

## 2017-11-06 DIAGNOSIS — Z114 Encounter for screening for human immunodeficiency virus [HIV]: Secondary | ICD-10-CM

## 2017-11-06 DIAGNOSIS — Z23 Encounter for immunization: Secondary | ICD-10-CM | POA: Diagnosis not present

## 2017-11-06 DIAGNOSIS — Z951 Presence of aortocoronary bypass graft: Secondary | ICD-10-CM

## 2017-11-06 DIAGNOSIS — F419 Anxiety disorder, unspecified: Secondary | ICD-10-CM | POA: Insufficient documentation

## 2017-11-06 DIAGNOSIS — Z1159 Encounter for screening for other viral diseases: Secondary | ICD-10-CM

## 2017-11-06 DIAGNOSIS — I10 Essential (primary) hypertension: Secondary | ICD-10-CM | POA: Diagnosis not present

## 2017-11-06 DIAGNOSIS — E782 Mixed hyperlipidemia: Secondary | ICD-10-CM

## 2017-11-06 MED ORDER — ASPIRIN EC 81 MG PO TBEC: 81.0000 mg | DELAYED_RELEASE_TABLET | Freq: Every day | ORAL | Status: AC

## 2017-11-06 NOTE — Progress Notes (Signed)
Patient ID: Donna Woodward, female    DOB: 25-Feb-1956, 62 y.o.   MRN: [id number]  Chief Complaint  Patient presents with  . Hypertension    follow up  . Hyperlipidemia  . Back Pain    chronic    Allergies Patient has no known allergies.  Subjective:   Donna Woodward is a 62 y.o. female who presents to [facility] today.  HPI Here to establish care. Was a previous patient of Dr. Meda Coffee. Is on disability for chronic low back pain. Has had a history of anxiety and depression. Reports that has always had side effects with medications and does not like the way that she feels.  Would not mind talking with a therapist but defers any type of medication for treatment of her mood disorder.  Feels like she functions well.  Denies any suicidal or homicidal ideations.  Reports that she deals with her anxiety and tries to relax and do things that help her to deal stress.  Is followed by cardiology for CAD/s/p CABGx3.  Reports that she is compliant with all her medications.  She denies any chest pain, shortness of breath, palpitations, or swelling in her extremities.  She has been trying to eat a healthier diet.  She has lost 10 pounds since her last office visit.  Has lived here for several years and wants to move back to [place]. Takes all medications and feels like she is compliant with diet. Is limited with exercise at times with back pain but reports sometimes she just does not feel like exercising because she feels lazy.  She reports that overall she feels good.  Does not need any medication refills.  Due for tetanus shot.  Would like to get some blood work done.  She does not smoke cigarettes.  She reports that she does occasionally smoke marijuana.   Past Medical History:  Diagnosis Date  . Anxiety   . Back pain   . Coronary artery disease    CABGx3; Seen by Dr. Johnsie Cancel  . Degenerative joint disease (DJD) of lumbar spine 03/24/2016  . Depression   . GERD (gastroesophageal  reflux disease)    if overweight  . Hyperlipidemia   . Hypertension     Past Surgical History:  Procedure Laterality Date  . BACK SURGERY    . CARDIAC CATHETERIZATION N/A 06/05/2016   Procedure: Left Heart Cath and Coronary Angiography;  Surgeon: Belva Crome, MD;  Location: [place] [facility];  Service: Cardiovascular;  Laterality: N/A;  . CORONARY ARTERY BYPASS GRAFT N/A 06/17/2016   Procedure: CORONARY ARTERY BYPASS GRAFTING (CABG), ON PUMP, TIMES THREE, USING LEFT INTERNAL MAMMARY ARTERY, RIGHT GREATER SAPHENOUS VEIN HARVESTED ENDOSCOPICALLY;  Surgeon: Ivin Poot, MD;  Location: [place];  Service: Open Heart Surgery;  Laterality: N/A;  . MULTIPLE TOOTH EXTRACTIONS    . [place]   lamnectomy L5S1  . TEE WITHOUT CARDIOVERSION N/A 06/17/2016   Procedure: TRANSESOPHAGEAL ECHOCARDIOGRAM (TEE);  Surgeon: Ivin Poot, MD;  Location: [place];  Service: Open Heart Surgery;  Laterality: N/A;    Family History  Problem Relation Age of Onset  . Hypertension Mother   . Stroke Father   . Heart disease Father   . Dementia Father   . Hypertension Sister   . CAD Sister 33       cabg  . Hypertension Sister   . Cancer Sister 22       rectal  . Hypertension Sister  Social History   Socioeconomic History  . Marital status: Single    Spouse name: Not on file  . Number of children: Not on file  . Years of education: 63  . Highest education level: Not on file  Occupational History  . Occupation: disabled    Comment: Office manager  . Financial resource strain: Not on file  . Food insecurity:    Worry: Not on file    Inability: Not on file  . Transportation needs:    Medical: Not on file    Non-medical: Not on file  Tobacco Use  . Smoking status: Former Smoker    Last attempt to quit: 06/30/2006    Years since quitting: 11.3  . Smokeless tobacco: Never Used  Substance and Sexual Activity  . Alcohol use: Yes    Comment: rare  . Drug use: Yes     Types: Marijuana  . Sexual activity: Not Currently  Lifestyle  . Physical activity:    Days per week: Not on file    Minutes per session: Not on file  . Stress: Not on file  Relationships  . Social connections:    Talks on phone: Not on file    Gets together: Not on file    Attends religious service: Not on file    Active member of club or organization: Not on file    Attends meetings of clubs or organizations: Not on file    Relationship status: Not on file  Other Topics Concern  . 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   Grew up in [place] and lived here for since 2014.    Disabled with back pain.    Current Outpatient Medications on File Prior to Visit  Medication Sig Dispense Refill  . lisinopril-hydrochlorothiazide (PRINZIDE,ZESTORETIC) 20-25 MG tablet TAKE 1 TABLET BY MOUTH ONCE DAILY 90 tablet 3  . metoprolol succinate (TOPROL-XL) 100 MG 24 hr tablet Take 1 tablet (100 mg total) by mouth daily. Take with or immediately following a meal. 90 tablet 3  . omega-3 fish oil (MAXEPA) 1000 MG CAPS capsule Take 1 capsule (1,000 mg total) by mouth 2 (two) times daily. Reported on 11/14/2015 180 each 1  . simvastatin (ZOCOR) 40 MG tablet Take 1 tablet (40 mg total) by mouth daily. 30 tablet 11   No current facility-administered medications on file prior to visit.     Review of Systems  Constitutional: Negative for activity change, appetite change and fever.  Eyes: Negative for visual disturbance.  Respiratory: Negative for cough, chest tightness and shortness of breath.   Cardiovascular: Negative for chest pain, palpitations and leg swelling.  Gastrointestinal: Negative for abdominal pain, nausea and vomiting.  Genitourinary: Negative for dysuria, frequency and urgency.  Neurological: Negative for dizziness, syncope and light-headedness.  Hematological: Negative for adenopathy.     Objective:   BP 114/62 (BP Location:  Left Arm, Patient Position: Sitting, Cuff Size: Normal)   Pulse 68   Temp 97.7 F (36.5 C) (Oral)   Resp 18   Ht 5\' 4"  (1.626 m)   Wt 157 lb (71.2 kg)   SpO2 96%   BMI 26.95 kg/m   Physical Exam  Constitutional: She is oriented to person, place, and time. She appears well-developed and well-nourished. No distress.  HENT:  Head: Normocephalic and atraumatic.  Eyes: Pupils are equal, round, and reactive to light.  Neck: Normal range of motion. Neck supple.  No thyromegaly present.  Cardiovascular: Normal rate, regular rhythm and normal heart sounds.  Pulmonary/Chest: Effort normal and breath sounds normal. No respiratory distress.  Neurological: She is alert and oriented to person, place, and time. No cranial nerve deficit.  Skin: Skin is warm and dry.  Psychiatric: Her behavior is normal. Judgment and thought content normal. Her mood appears anxious. Her speech is not slurred. Cognition and memory are normal. She expresses no homicidal and no suicidal ideation. She expresses no suicidal plans and no homicidal plans. She is attentive.  Nursing note and vitals reviewed.  Depression screen [facility] 2/9 11/06/2017 04/24/2017 10/23/2016 09/11/2016 07/24/2016  Decreased Interest 1 0 0 0 0  Down, Depressed, Hopeless 2 0 0 1 0  PHQ - 2 Score 3 0 0 1 0  Altered sleeping 2 - - 1 -  Tired, decreased energy 1 - - 0 -  Change in appetite 0 - - 1 -  Feeling bad or failure about yourself  2 - - 1 -  Trouble concentrating 0 - - 0 -  Moving slowly or fidgety/restless 0 - - 0 -  Suicidal thoughts 0 - - - -  PHQ-9 Score 8 - - 4 -  Difficult doing work/chores Somewhat difficult - - Not difficult at all -    Assessment and Plan   1. Mixed hyperlipidemia Hyperlipidemia was discussed today. Secondary prevention of ASCVD was discussed and how it relates to patient morbidity, mortality, and quality of life. Shared decision making with patient including the risks of statins vs.benefits of ASCVD risk reduction  discussed.  Risks of stains discussed including myopathy, rhabdomyoloysis, liver problems, increased risk of diabetes discussed. We discussed heart healthy diet, lifestyle modifications, risk factor modifications, and adherence to the recommended treatment plan. We discussed the need to periodically monitor lipid panel and liver function tests while on statin therapy.  -LDL goal less than 70 - Lipid panel  2. Essential hypertension, benign Lifestyle modifications discussed with patient including a diet emphasizing vegetables, fruits, and whole grains. Limiting intake of sodium to less than 2,400 mg per day.  Recommendations discussed include consuming low-fat dairy products, poultry, fish, legumes, non-tropical vegetable oils, and nuts; and limiting intake of sweets, sugar-sweetened beverages, and red meat. Discussed following a plan such as the Dietary Approaches to Stop Hypertension (DASH) diet. Patient to read up on this diet.  Continue medications.  - COMPLETE METABOLIC PANEL WITH GFR  3. S/P CABG x 3 Follow up with Cardiology.  - aspirin EC 81 MG tablet; Take 1 tablet (81 mg total) by mouth daily.  4. Anxiety Patient is stable at this time.  I do believe that she would benefit from therapy and better coping skills.  She defers any medication.  Virtual behavioral health referral placed.  She was told at any point if she has any problems with her mood to please call, follow-up, or seek medical help.  She voiced understanding.  5. Immunization due Vaccination given. - Tdap vaccine greater than or equal to 7yo IM Patient was counseled regarding smoking of marijuana.  She was counseled regarding the risks to her health and also regarding the fact that this is an illegal drug. No follow-ups on file. Caren Macadam, MD 11/06/2017

## 2017-11-09 ENCOUNTER — Other Ambulatory Visit: Payer: Self-pay | Admitting: *Deleted

## 2017-11-09 MED ORDER — METOPROLOL SUCCINATE ER 100 MG PO TB24: 100.0000 mg | ORAL_TABLET | Freq: Every day | ORAL | 3 refills | Status: DC

## 2017-11-09 NOTE — Telephone Encounter (Signed)
Last office note has metoprolol succinate 50 mg listed but refill request is for 100 mg. Please advise. Thanks, MI

## 2017-11-09 NOTE — Telephone Encounter (Signed)
Patient's metoprolol was increase in May, 2, 2018. Patient's most recent office visit with PCP has patient taking metoprolol 100 mg daily.

## 2017-11-11 ENCOUNTER — Telehealth: Payer: Self-pay | Admitting: Clinical

## 2017-11-11 ENCOUNTER — Encounter (INDEPENDENT_AMBULATORY_CARE_PROVIDER_SITE_OTHER): Payer: Self-pay

## 2017-11-11 DIAGNOSIS — F4323 Adjustment disorder with mixed anxiety and depressed mood: Secondary | ICD-10-CM
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
## 2017-11-14 ENCOUNTER — Encounter: Payer: Self-pay | Admitting: Cardiovascular Disease

## 2017-11-14 ENCOUNTER — Other Ambulatory Visit: Payer: Self-pay | Admitting: Cardiovascular Disease

## 2017-11-17 ENCOUNTER — Other Ambulatory Visit: Payer: Self-pay | Admitting: Cardiovascular Disease

## 2017-11-17 MED ORDER — METOPROLOL SUCCINATE ER 100 MG PO TB24: 100.0000 mg | ORAL_TABLET | Freq: Every day | ORAL | 0 refills | Status: DC

## 2017-11-18 NOTE — Progress Notes (Signed)
[facility] specialist to continue to follow up with the patient. No medication recommendation requested at this time.

## 2017-11-20 ENCOUNTER — Telehealth: Payer: Self-pay | Admitting: Clinical

## 2017-11-20 NOTE — Telephone Encounter (Signed)
This [facility] specialist left message to call back with name and contact information. 

## 2017-11-24 ENCOUNTER — Telehealth: Payer: Self-pay | Admitting: Family Medicine

## 2017-11-24 NOTE — Telephone Encounter (Signed)
[facility] pharmacy left a voicemail for simvastatin medication request [phone]

## 2017-11-24 NOTE — Telephone Encounter (Signed)
You may refill.  She is on simvastatin 40 mg, 1 p.o. nightly.  #90.  Refills 1

## 2017-11-25 ENCOUNTER — Other Ambulatory Visit: Payer: Self-pay

## 2017-11-25 MED ORDER — SIMVASTATIN 40 MG PO TABS: 40.0000 mg | ORAL_TABLET | Freq: Every day | ORAL | 1 refills | Status: DC

## 2017-11-25 NOTE — Telephone Encounter (Signed)
Refill sent in to [facility] for #90 w/ 1 refill per Dr.Hagler's instructions

## 2017-12-03 ENCOUNTER — Encounter: Payer: Self-pay | Admitting: Family Medicine

## 2017-12-04 ENCOUNTER — Encounter: Payer: Self-pay | Admitting: Family Medicine

## 2017-12-07 ENCOUNTER — Telehealth: Payer: Self-pay

## 2017-12-07 DIAGNOSIS — D23122 Other benign neoplasm of skin of left lower eyelid, including canthus: Secondary | ICD-10-CM | POA: Diagnosis not present

## 2017-12-07 NOTE — Telephone Encounter (Signed)
[facility] - Left Msg 

## 2018-01-04 DIAGNOSIS — R5383 Other fatigue: Secondary | ICD-10-CM | POA: Diagnosis not present

## 2018-01-04 DIAGNOSIS — Z1159 Encounter for screening for other viral diseases: Secondary | ICD-10-CM | POA: Diagnosis not present

## 2018-01-04 DIAGNOSIS — I10 Essential (primary) hypertension: Secondary | ICD-10-CM | POA: Diagnosis not present

## 2018-01-04 DIAGNOSIS — Z114 Encounter for screening for human immunodeficiency virus [HIV]: Secondary | ICD-10-CM | POA: Diagnosis not present

## 2018-01-04 DIAGNOSIS — E782 Mixed hyperlipidemia: Secondary | ICD-10-CM | POA: Diagnosis not present

## 2018-01-05 LAB — COMPLETE METABOLIC PANEL WITH GFR
AG Ratio: 2.2 (calc) (ref 1.0–2.5)
ALBUMIN MSPROF: 4.4 g/dL (ref 3.6–5.1)
ALT: 24 U/L (ref 6–29)
AST: 21 U/L (ref 10–35)
Alkaline phosphatase (APISO): 61 U/L (ref 33–130)
BILIRUBIN TOTAL: 0.6 mg/dL (ref 0.2–1.2)
BUN: 18 mg/dL (ref 7–25)
CALCIUM: 10 mg/dL (ref 8.6–10.4)
CO2: 32 mmol/L (ref 20–32)
CREATININE: 0.73 mg/dL (ref 0.50–0.99)
Chloride: 104 mmol/L (ref 98–110)
GFR, EST AFRICAN AMERICAN: 103 mL/min/{1.73_m2} (ref >=60)
GFR, EST NON AFRICAN AMERICAN: 89 mL/min/{1.73_m2} (ref >=60)
GLUCOSE: 99 mg/dL (ref 65–99)
Globulin: 2 g/dL (calc) (ref 1.9–3.7)
Potassium: 3.8 mmol/L (ref 3.5–5.3)
Sodium: 145 mmol/L (ref 135–146)
TOTAL PROTEIN: 6.4 g/dL (ref 6.1–8.1)

## 2018-01-05 LAB — CBC WITH DIFFERENTIAL/PLATELET
BASOS PCT: 0.6 %
Basophils Absolute: 50 cells/uL (ref 0–200)
EOS ABS: 294 {cells}/uL (ref 15–500)
EOS PCT: 3.5 %
HEMATOCRIT: 43.3 % (ref 35.0–45.0)
Hemoglobin: 14.2 g/dL (ref 11.7–15.5)
Lymphs Abs: 1638 cells/uL (ref 850–3900)
MCH: 29.1 pg (ref 27.0–33.0)
MCHC: 32.8 g/dL (ref 32.0–36.0)
MCV: 88.7 fL (ref 80.0–100.0)
MONOS PCT: 7.3 %
MPV: 11.3 fL (ref 7.5–12.5)
Neutro Abs: 5804 cells/uL (ref 1500–7800)
Neutrophils Relative %: 69.1 %
Platelets: 165 10*3/uL (ref 140–400)
RBC: 4.88 10*6/uL (ref 3.80–5.10)
RDW: 14 % (ref 11.0–15.0)
TOTAL LYMPHOCYTE: 19.5 %
WBC: 8.4 10*3/uL (ref 3.8–10.8)
WBCMIX: 613 {cells}/uL (ref 200–950)

## 2018-01-05 LAB — HEPATITIS C ANTIBODY
HEP C AB: NONREACTIVE
SIGNAL TO CUT-OFF: 0 (ref <1.00)

## 2018-01-05 LAB — LIPID PANEL
CHOLESTEROL: 133 mg/dL (ref <200)
HDL: 35 mg/dL — AB (ref >50)
LDL Cholesterol (Calc): 81 mg/dL (calc)
Non-HDL Cholesterol (Calc): 98 mg/dL (calc) (ref <130)
Total CHOL/HDL Ratio: 3.8 (calc) (ref <5.0)
Triglycerides: 83 mg/dL (ref <150)

## 2018-01-05 LAB — TSH: TSH: 2.01 mIU/L (ref 0.40–4.50)

## 2018-01-05 LAB — HIV ANTIBODY (ROUTINE TESTING W REFLEX): HIV 1&2 Ab, 4th Generation: NONREACTIVE

## 2018-02-03 ENCOUNTER — Ambulatory Visit: Payer: Medicare HMO | Admitting: Cardiovascular Disease

## 2018-02-05 ENCOUNTER — Telehealth: Payer: Self-pay

## 2018-02-05 NOTE — Telephone Encounter (Signed)
Patient declined services.  Patient was not abe to answer questions for the updated PHQ or GAD

## 2018-02-28 ENCOUNTER — Encounter: Payer: Self-pay | Admitting: Family Medicine

## 2018-03-02 ENCOUNTER — Other Ambulatory Visit: Payer: Self-pay

## 2018-03-02 MED ORDER — SIMVASTATIN 40 MG PO TABS: 40.0000 mg | ORAL_TABLET | Freq: Every day | ORAL | 0 refills | Status: DC

## 2018-03-02 MED ORDER — LISINOPRIL-HYDROCHLOROTHIAZIDE 20-25 MG PO TABS: 1.0000 | ORAL_TABLET | Freq: Every day | ORAL | 0 refills | Status: DC

## 2018-05-05 ENCOUNTER — Encounter: Payer: Self-pay | Admitting: Family Medicine

## 2018-05-05 ENCOUNTER — Ambulatory Visit (INDEPENDENT_AMBULATORY_CARE_PROVIDER_SITE_OTHER): Payer: Medicare HMO | Admitting: Family Medicine

## 2018-05-05 VITALS — BP 106/70 | HR 70 | Temp 97.8°F | Ht 64.0 in | Wt 173.0 lb

## 2018-05-05 DIAGNOSIS — F329 Major depressive disorder, single episode, unspecified: Secondary | ICD-10-CM | POA: Diagnosis not present

## 2018-05-05 DIAGNOSIS — Z7689 Persons encountering health services in other specified circumstances: Secondary | ICD-10-CM

## 2018-05-05 DIAGNOSIS — F419 Anxiety disorder, unspecified: Secondary | ICD-10-CM

## 2018-05-05 DIAGNOSIS — E785 Hyperlipidemia, unspecified: Secondary | ICD-10-CM | POA: Diagnosis not present

## 2018-05-05 DIAGNOSIS — I10 Essential (primary) hypertension: Secondary | ICD-10-CM

## 2018-05-05 DIAGNOSIS — I2510 Atherosclerotic heart disease of native coronary artery without angina pectoris: Secondary | ICD-10-CM

## 2018-05-05 NOTE — Progress Notes (Signed)
Patient presents to clinic today to f/u on chronic issues and establish care.  SUBJECTIVE: PMH: Pt is a 62 yo female with pmh sig for depression, diverticulitis, HTN, HLD.  Pt previously seen by Dr. Caren Macadam in [place].   Anxiety and depression: -not currently on meds -endorses recurrent issue.   -pt endorses feeling depressed thinking about chronic medical conditions (back, elbow,and shoulder pain) -pt has been to counseling in the past, but finds it difficult as she has a degree in Psychology.  HTN: -not checking bp at home -taking lisinopril-hctz 20-25 mg daily and metoprolol ER 100 mg daily  HLD: -simvastatin 40 mg daily -denies myalgias  CAD: -s/p 3 v CABG in 2017 -taking ASA 81 mg daily -Cardiologist, Dr. Jenkins [place]  Chronic pain: -chronic lumbar pain/weakness -b/l rotator cuff tendonitis -bursitis L elbow -Tennis elbow, R  Allergies: NKDA  Past Surg Hx: -Herniated/ruptured lumbar disc 1989/1990 -3v CABG 2017  Social hx: Pt is single.  She has a Brewing technologist in Psychology.  Pt would like to pursue a Doctorate in Psychology perhaps in behavioral analysis.  Pt is not working, as she is disabled.  Pt states she needs a provider to re-evaluate her disability claim.  Pt states she has another court date regarding her case but the date has not been set.  Pt endorses EtOH use.  Pt denies current tobacco use, but smoke cigarettes in the past.  Pt endorses marijuana use.    Health Maintenance: Immunizations -- tetanus 2019 Mammogram --05/07/17  Past Medical History:  Diagnosis Date  . Anxiety   . Back pain   . Coronary artery disease    CABGx3; Seen by Dr. Johnsie Cancel  . Degenerative joint disease (DJD) of lumbar spine 03/24/2016  . Depression   . GERD (gastroesophageal reflux disease)    if overweight  . Hyperlipidemia   . Hypertension     Past Surgical History:  Procedure Laterality Date  . BACK SURGERY    . CARDIAC CATHETERIZATION N/A 06/05/2016   Procedure: Left Heart Cath and Coronary Angiography;  Surgeon: Belva Crome, MD;  Location: [place] [facility];  Service: Cardiovascular;  Laterality: N/A;  . CORONARY ARTERY BYPASS GRAFT N/A 06/17/2016   Procedure: CORONARY ARTERY BYPASS GRAFTING (CABG), ON PUMP, TIMES THREE, USING LEFT INTERNAL MAMMARY ARTERY, RIGHT GREATER SAPHENOUS VEIN HARVESTED ENDOSCOPICALLY;  Surgeon: Ivin Poot, MD;  Location: [place];  Service: Open Heart Surgery;  Laterality: N/A;  . MULTIPLE TOOTH EXTRACTIONS    . [place]   lamnectomy L5S1  . TEE WITHOUT CARDIOVERSION N/A 06/17/2016   Procedure: TRANSESOPHAGEAL ECHOCARDIOGRAM (TEE);  Surgeon: Ivin Poot, MD;  Location: [place];  Service: Open Heart Surgery;  Laterality: N/A;    Current Outpatient Medications on File Prior to Visit  Medication Sig Dispense Refill  . aspirin EC 81 MG tablet Take 1 tablet (81 mg total) by mouth daily.    . Calcium Carb-Cholecalciferol (HM CALCIUM-VITAMIN D) 600-800 MG-UNIT TABS Take by mouth daily.    Marland Kitchen lisinopril-hydrochlorothiazide (PRINZIDE,ZESTORETIC) 20-25 MG tablet Take 1 tablet by mouth daily. 90 tablet 0  . metoprolol succinate (TOPROL-XL) 100 MG 24 hr tablet Take 1 tablet (100 mg total) by mouth daily. Take with or immediately following a meal. Please keep upcoming appt. Thank you 90 tablet 0  . omega-3 fish oil (MAXEPA) 1000 MG CAPS capsule Take 1 capsule (1,000 mg total) by mouth 2 (two) times daily. Reported on 11/14/2015 180 each 1  . simvastatin (  ZOCOR) 40 MG tablet Take 1 tablet (40 mg total) by mouth daily. 90 tablet 0   No current facility-administered medications on file prior to visit.     No Known Allergies  Family History  Problem Relation Age of Onset  . Hypertension Mother   . Stroke Father   . Heart disease Father   . Dementia Father   . Hypertension Sister   . CAD Sister 67       cabg  . Hypertension Sister   . Cancer Sister 31       rectal  . Hypertension Sister     Social  History   Socioeconomic History  . Marital status: Single    Spouse name: Not on file  . Number of children: Not on file  . Years of education: 35  . Highest education level: Not on file  Occupational History  . Occupation: disabled    Comment: Office manager  . Financial resource strain: Not on file  . Food insecurity:    Worry: Not on file    Inability: Not on file  . Transportation needs:    Medical: Not on file    Non-medical: Not on file  Tobacco Use  . Smoking status: Former Smoker    Last attempt to quit: 06/30/2006    Years since quitting: 11.8  . Smokeless tobacco: Never Used  Substance and Sexual Activity  . Alcohol use: Yes    Comment: rare  . Drug use: Yes    Types: Marijuana  . Sexual activity: Not Currently  Lifestyle  . Physical activity:    Days per week: Not on file    Minutes per session: Not on file  . Stress: Not on file  Relationships  . Social connections:    Talks on phone: Not on file    Gets together: Not on file    Attends religious service: Not on file    Active member of club or organization: Not on file    Attends meetings of clubs or organizations: Not on file    Relationship status: Not on file  . Intimate partner violence:    Fear of current or ex partner: Not on file    Emotionally abused: Not on file    Physically abused: Not on file    Forced sexual activity: Not on file  Other Topics Concern  . 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   Grew up in [place] and lived here for since 2014.    Disabled with back pain.     ROS General: Denies fever, chills, night sweats, changes in weight, changes in appetite HEENT: Denies headaches, ear pain, changes in vision, rhinorrhea, sore throat CV: Denies CP, palpitations, SOB, orthopnea Pulm: Denies SOB, cough, wheezing GI: Denies abdominal pain, nausea, vomiting, diarrhea, constipation GU: Denies dysuria, hematuria,  frequency, vaginal discharge Msk: Denies muscle cramps  +chronic back pain, shoulders, elbows Neuro: Denies weakness, numbness, tingling Skin: Denies rashes, bruising Psych: Denies hallucinations  +anxiety and depression  BP 106/70 (BP Location: Left Arm, Patient Position: Sitting, Cuff Size: Normal)   Pulse 70   Temp 97.8 F (36.6 C) (Oral)   Ht 5\' 4"  (1.626 m)   Wt 173 lb (78.5 kg)   SpO2 96%   BMI 29.70 kg/m   Physical Exam Gen. Pleasant, well developed, well-nourished, tearful off and on during visit, in NAD HEENT - [place]/AT, PERRL, no scleral  icterus, no nasal drainage, pharynx without erythema or exudate. Lungs: no use of accessory muscles, CTAB, no wheezes, rales or rhonchi Cardiovascular: RRR, No r/g/m, no peripheral edema Neuro:  A&Ox3, CN II-XII intact, normal gait Skin:  Warm, dry, intact, no lesions  Well healed midline sternotomy scar.  No results found for this or any previous visit (from the past 2160 hour(s)).  Assessment/Plan: Anxiety and depression -PHQ 9 score 17 -GAD 7 score 13 -pt encouraged to consider counseling.  Advised to make her own appointment -discussed medication options.  Pt wishes to wait at this time.  Essential hypertension -controlled -continue lisinopril-hctz 20-25 mg daily and metoprolol ER 100 mg daily -discussed lifestyle modifications  Hyperlipidemia, unspecified hyperlipidemia type -continue simvastatin 40 mg daily -lifestyle modifications -encouraged to continue f/u with Cardiology  CAD -continue f/u with Cardiology -continue lifestyle modifications -continue ASA 81 mg daily, lisinopril-hctz, metoprolol er, simvastatin  Encounter to establish care -We reviewed the PMH, PSH, FH, SH, Meds and Allergies. -We provided refills for any medications we will prescribe as needed. -We addressed current concerns per orders and patient instructions. -We have asked for records for pertinent exams, studies, vaccines and notes from previous  providers. -We have advised patient to follow up per instructions below.  F/u in 1 month, sooner  Grier Mitts, MD

## 2018-05-07 ENCOUNTER — Encounter: Payer: Self-pay | Admitting: Family Medicine

## 2018-05-11 ENCOUNTER — Ambulatory Visit: Payer: Medicare HMO | Admitting: Family Medicine

## 2018-06-28 NOTE — Progress Notes (Signed)
Cardiology Office Note    Date:  07/05/2018   ID:  GREY SCHLAUCH, DOB [birth year], MRN [id number]  PCP:  Billie Ruddy, MD  Cardiologist:  Dr. Johnsie Cancel  Chief Complaint: Hospital follow up s/p CABG  History of Present Illness:   Donna Woodward is a 62 y.o. female with HTN, HLD, previous smoker and  CABG presents for follow up. Seen for SSCP 05/07/16   F/u Cath 06/05/16 showed 3V disease s/p CABG x 3 (LIMA to LAD; SVG to posterior descending; SVG to diagonal). Post op Had some volume overload and ventricular ectopy Amiodarone d/c and Toprol increased   Holter monitor reviewed:  NSR rates 51-119 PVC;s 7/hr some short bursts NSVT 5 beats and PSVT same 5-6 beats   Has done well on medical RX   Depressed from [place] and wants to return but can't afford it Some neuropathic pain over left chest area from CABG  Past Medical History:  Diagnosis Date  . Anxiety   . Back pain   . Coronary artery disease    CABGx3; Seen by Dr. Johnsie Cancel  . Degenerative joint disease (DJD) of lumbar spine 03/24/2016  . Depression   . GERD (gastroesophageal reflux disease)    if overweight  . Hyperlipidemia   . Hypertension     Past Surgical History:  Procedure Laterality Date  . BACK SURGERY    . CARDIAC CATHETERIZATION N/A 06/05/2016   Procedure: Left Heart Cath and Coronary Angiography;  Surgeon: Belva Crome, MD;  Location: [facility];  Service: Cardiovascular;  Laterality: N/A;  . CORONARY ARTERY BYPASS GRAFT N/A 06/17/2016   Procedure: CORONARY ARTERY BYPASS GRAFTING (CABG), ON PUMP, TIMES THREE, USING LEFT INTERNAL MAMMARY ARTERY, RIGHT GREATER SAPHENOUS VEIN HARVESTED ENDOSCOPICALLY;  Surgeon: Ivin Poot, MD;  Location: [facility];  Service: Open Heart Surgery;  Laterality: N/A;  . MULTIPLE TOOTH EXTRACTIONS    . [place]   lamnectomy L5S1  . TEE WITHOUT CARDIOVERSION N/A 06/17/2016   Procedure: TRANSESOPHAGEAL ECHOCARDIOGRAM (TEE);  Surgeon: Ivin Poot, MD;   Location: [facility];  Service: Open Heart Surgery;  Laterality: N/A;    Current Medications: Prior to Admission medications   Medication Sig Start Date End Date Taking? Authorizing Provider  amiodarone (PACERONE) 200 MG tablet Take 1 tablet (200 mg total) by mouth 2 (two) times daily. For one week;then take Amiodarone 200 mg daily by mouth thereafter 06/24/16   Nani Skillern, PA-C  aspirin EC 325 MG EC tablet Take 1 tablet (325 mg total) by mouth daily. 06/24/16   Donielle Liston Alba, PA-C  guaiFENesin (MUCINEX) 600 MG 12 hr tablet Take 1 tablet (600 mg total) by mouth 2 (two) times daily as needed for cough. 06/24/16   Donielle Liston Alba, PA-C  lisinopril-hydrochlorothiazide (PRINZIDE,ZESTORETIC) 20-25 MG tablet Take 1 tablet by mouth daily. Patient taking differently: Take 1 tablet by mouth daily. Taken in the morning 03/24/16   Raylene Everts, MD  metoprolol succinate (TOPROL-XL) 50 MG 24 hr tablet Take 1 tablet (50 mg total) by mouth daily. Take with or immediately following a meal. Patient taking differently: Take 50 mg by mouth. Take with or immediately following a meal; in the morning 03/24/16   Raylene Everts, MD  omega-3 fish oil (MAXEPA) 1000 MG CAPS capsule Take 1 capsule (1,000 mg total) by mouth 2 (two) times daily. Reported on 11/14/2015 03/24/16   Raylene Everts, MD  potassium chloride SA (K-DUR,KLOR-CON) 20 MEQ tablet  Take 1 tablet (20 mEq total) by mouth daily. Patient taking differently: Take 20 mEq by mouth daily. Taken at night 06/04/16   Imogene Burn, PA-C  simvastatin (ZOCOR) 20 MG tablet Take 1 tablet (20 mg total) by mouth at bedtime. 03/24/16   Raylene Everts, MD  traMADol (ULTRAM) 50 MG tablet Take 1 tablet (50 mg total) by mouth every 4 (four) hours as needed for moderate pain. 06/24/16   Donielle Liston Alba, PA-C    Allergies:   Patient has no known allergies.   Social History   Socioeconomic History  . Marital status: Single    Spouse name: Not  on file  . Number of children: Not on file  . Years of education: 57  . Highest education level: Not on file  Occupational History  . Occupation: disabled    Comment: Office manager  . Financial resource strain: Not on file  . Food insecurity:    Worry: Not on file    Inability: Not on file  . Transportation needs:    Medical: Not on file    Non-medical: Not on file  Tobacco Use  . Smoking status: Former Smoker    Last attempt to quit: 06/30/2006    Years since quitting: 12.0  . Smokeless tobacco: Never Used  Substance and Sexual Activity  . Alcohol use: Yes    Comment: rare  . Drug use: Yes    Types: Marijuana  . Sexual activity: Not Currently  Lifestyle  . Physical activity:    Days per week: Not on file    Minutes per session: Not on file  . Stress: Not on file  Relationships  . Social connections:    Talks on phone: Not on file    Gets together: Not on file    Attends religious service: Not on file    Active member of club or organization: Not on file    Attends meetings of clubs or organizations: Not on file    Relationship status: Not on file  Other Topics Concern  . 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   Grew up in [place] and lived here for since 2014.    Disabled with back pain.      Family History:  The patient's family history includes CAD (age of onset: 73) in her sister; Cancer (age of onset: 8) in her sister; Dementia in her father; Heart disease in her father; Hypertension in her mother, sister, sister, and sister; Stroke in her father.   ROS:   Please see the history of present illness.    ROS All other systems reviewed and are negative.   PHYSICAL EXAM:   VS:  BP (!) 154/90   Pulse (!) 56   Ht 5\' 4"  (1.626 m)   Wt 174 lb 9.6 oz (79.2 kg)   SpO2 94%   BMI 29.97 kg/m    Affect appropriate Healthy:  appears stated age 60: normal Neck supple with no adenopathy JVP  normal no bruits no thyromegaly Lungs clear with no wheezing and good diaphragmatic motion Heart:  S1/S2 no murmur, no rub, gallop or click PMI normal post sternotomy  Abdomen: benighn, BS positve, no tenderness, no AAA no bruit.  No HSM or HJR Distal pulses intact with no bruits No edema Neuro non-focal Skin warm and dry No muscular weakness Venectomy scars RU thigh and calf and L calf healed well Multiple  skin tatoos    Wt Readings from Last 3 Encounters:  07/05/18 174 lb 9.6 oz (79.2 kg)  05/05/18 173 lb (78.5 kg)  11/06/17 157 lb (71.2 kg)      Studies/Labs Reviewed:   EKG:   NSR rate 62 normal 06/18/16  07/05/17 SR rate 56 PVC nonspecific ST changes   Recent Labs: 01/04/2018: ALT 24; BUN 18; Creat 0.73; Hemoglobin 14.2; Platelets 165; Potassium 3.8; Sodium 145; TSH 2.01   Lipid Panel    Component Value Date/Time   CHOL 133 01/04/2018 1038   TRIG 83 01/04/2018 1038   HDL 35 (L) 01/04/2018 1038   CHOLHDL 3.8 01/04/2018 1038   VLDL 33 (H) 10/23/2016 1450   LDLCALC 81 01/04/2018 1038    Additional studies/ records that were reviewed today include:   TEE 06/17/16 Result status: Final result   Left ventricle: Normal cavity size and wall thickness. LV systolic function is low normal with an EF of 50-55%. No thrombus present. No mass present.  Septum: No Patent Foramen Ovale present. The interatrial septum bows to the left consistent with high right atrial pressure.  Left atrium: Patent foramen ovale not present.  Aortic valve: The valve is trileaflet. Mild valve calcification present. No stenosis. No stenosis by continuous wave doppler Trace regurgitation.  Mitral valve: Mild leaflet thickening is present. Trace regurgitation.  Right ventricle: Normal cavity size and ejection fraction. There is mild hypertrophy.  Tricuspid valve: Mild regurgitation. The tricuspid valve regurgitation jet is central.     Cardiac Catheterization:  06/05/16 Left Heart Cath and  Coronary Angiography  Conclusion    Severe calcific, diffuse three-vessel coronary disease.  Codominant LAD/diagonal system with severe diffuse disease in each vessel with graftable distal targets.  Eccentric 30-60% ostial left main depending upon review.  99% proximal to mid RCA. Total occlusion at the ostium of a large PDA that fills left-to-right collaterals.  Moderate mid circumflex disease but without graftable targets beyond the stenosis.  Normal left ventricular systolic function with normal hemodynamics   RECOMMENDATIONS:   Continue current medical regimen.  Expedient evaluation by [facility] for multivessel coronary bypass grafting.       ASSESSMENT & PLAN:    1. CAD s/p CABG x 3 06/05/16  - No anginal pain. Continue ASA and Statin and beta blocker   2. HTN - Well controlled.  Continue current medications and low sodium Dash type diet.    3. HLD - 01/04/2018: Cholesterol 133; HDL 35; LDL Cholesterol (Calc) 81; Triglycerides 83  - Continue statin. Due for LFT and lipid panel  --> she will f/u with PCP.   4. PVC;s  - not that frequent and asymptomatic on Toprol   F/U with me in  A year   Jenkins Rouge Patient ID: Donna Woodward, female   DOB: Apr 28, 1956, 62 y.o.   MRN: [id number]

## 2018-07-05 ENCOUNTER — Encounter: Payer: Self-pay | Admitting: Cardiovascular Disease

## 2018-07-05 ENCOUNTER — Ambulatory Visit: Payer: Medicare HMO | Admitting: Cardiovascular Disease

## 2018-07-05 VITALS — BP 154/90 | HR 56 | Ht 64.0 in | Wt 174.6 lb

## 2018-07-05 DIAGNOSIS — I10 Essential (primary) hypertension: Secondary | ICD-10-CM | POA: Diagnosis not present

## 2018-07-05 DIAGNOSIS — I25810 Atherosclerosis of coronary artery bypass graft(s) without angina pectoris: Secondary | ICD-10-CM | POA: Diagnosis not present

## 2018-07-05 DIAGNOSIS — I493 Ventricular premature depolarization: Secondary | ICD-10-CM | POA: Diagnosis not present

## 2018-07-05 DIAGNOSIS — E785 Hyperlipidemia, unspecified: Secondary | ICD-10-CM

## 2018-07-05 DIAGNOSIS — Z951 Presence of aortocoronary bypass graft: Secondary | ICD-10-CM | POA: Diagnosis not present

## 2018-07-05 NOTE — Patient Instructions (Addendum)

## 2018-08-07 ENCOUNTER — Telehealth: Payer: Self-pay

## 2018-08-10 NOTE — Telephone Encounter (Signed)
Pt calling to check on the status of this and find out why they have been denied.

## 2018-08-11 ENCOUNTER — Encounter: Payer: Self-pay | Admitting: Family Medicine

## 2018-08-11 ENCOUNTER — Other Ambulatory Visit: Payer: Self-pay

## 2018-08-11 MED ORDER — SIMVASTATIN 40 MG PO TABS: 40.0000 mg | ORAL_TABLET | Freq: Every day | ORAL | 0 refills | Status: DC

## 2018-08-11 MED ORDER — LISINOPRIL-HYDROCHLOROTHIAZIDE 20-25 MG PO TABS: 1.0000 | ORAL_TABLET | Freq: Every day | ORAL | 0 refills | Status: DC

## 2018-08-11 NOTE — Telephone Encounter (Signed)
Spoke with pt aware that Rx was sent to [facility]

## 2018-08-30 IMAGING — DX DG CHEST 2V
2 series · 2 of 2 positions shown · non-contrast
Comparison: None.

CLINICAL DATA: Chest pain. Abnormal stress test. Hypertension and
hyperlipidemia.

EXAM:
CHEST  2 VIEW

[chest pa]
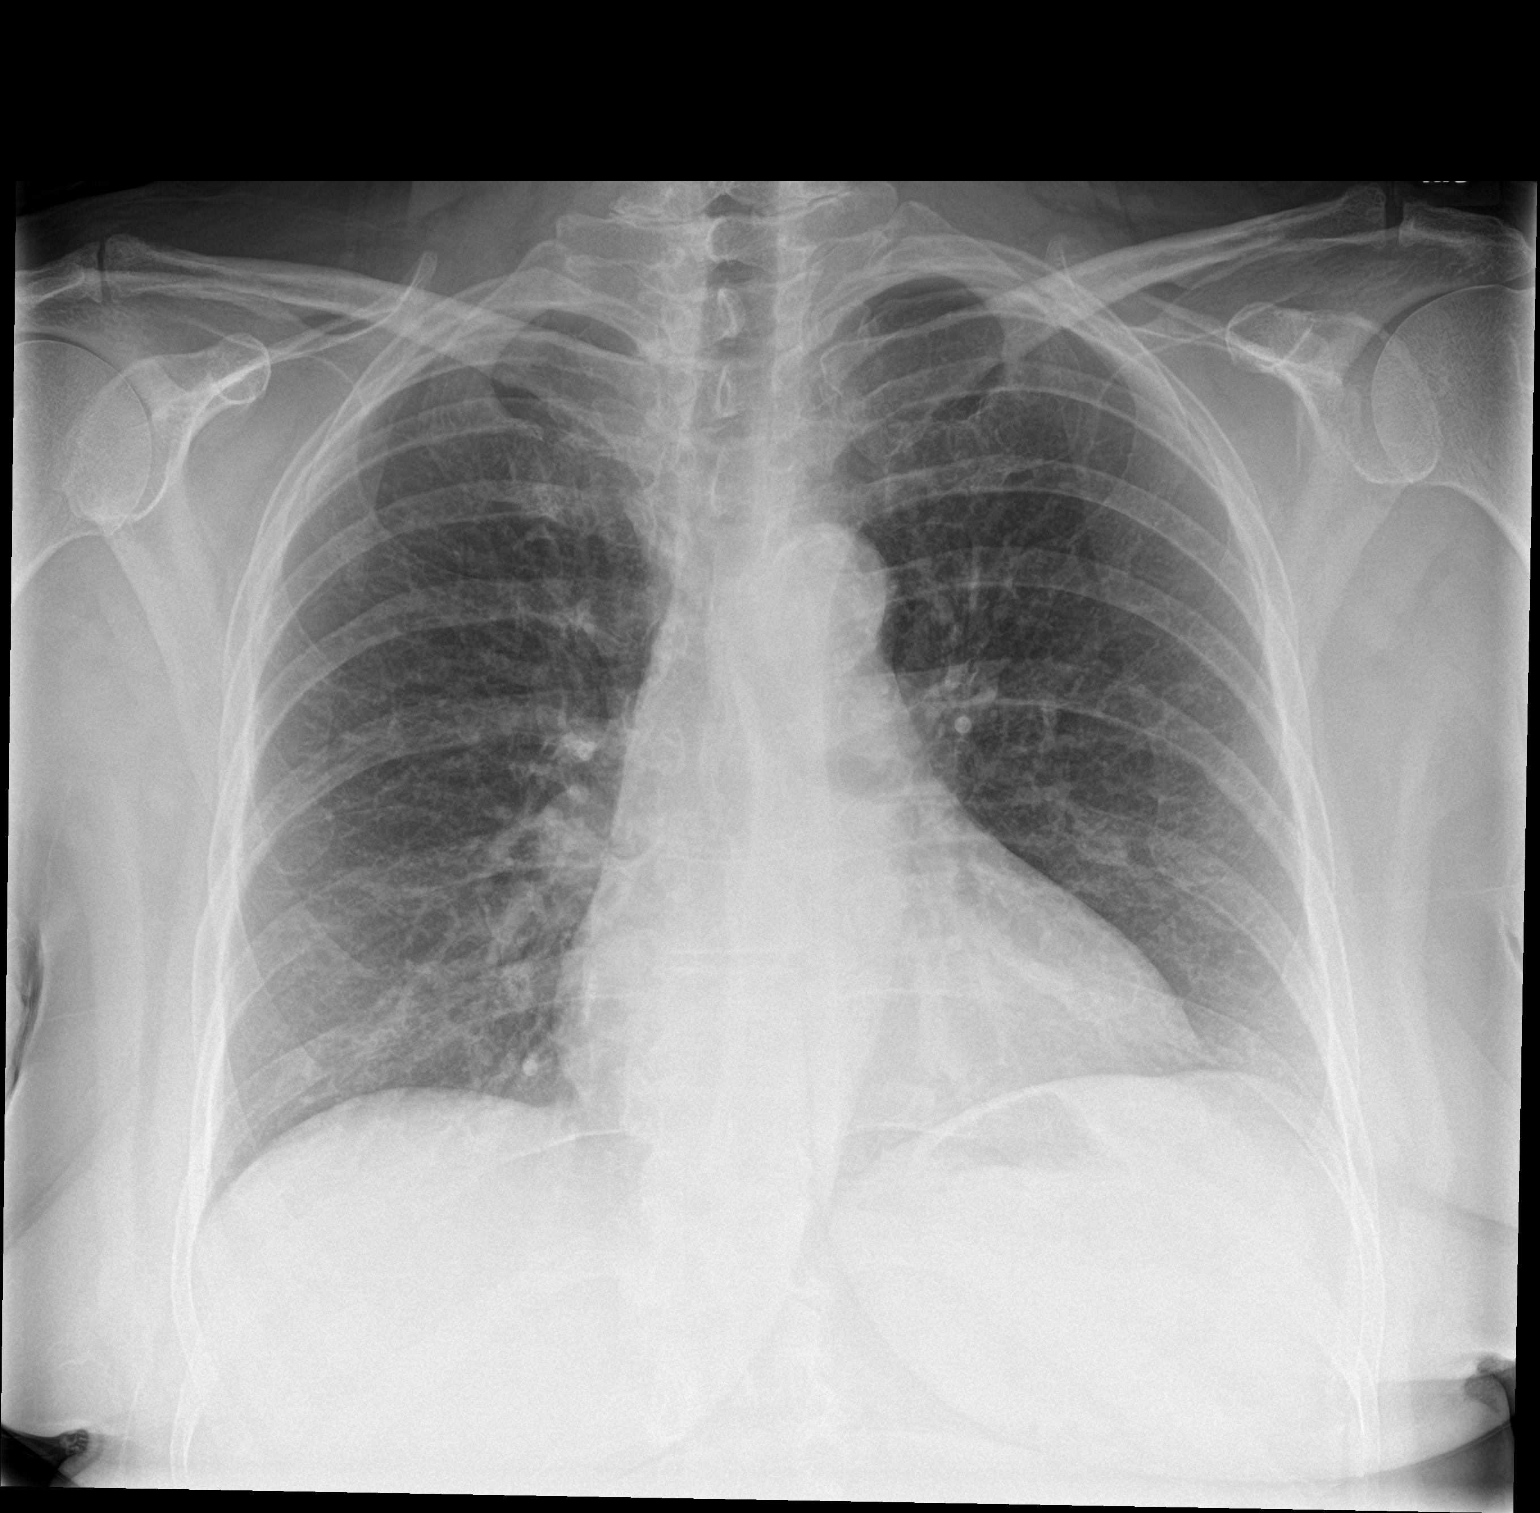

[chest lat]
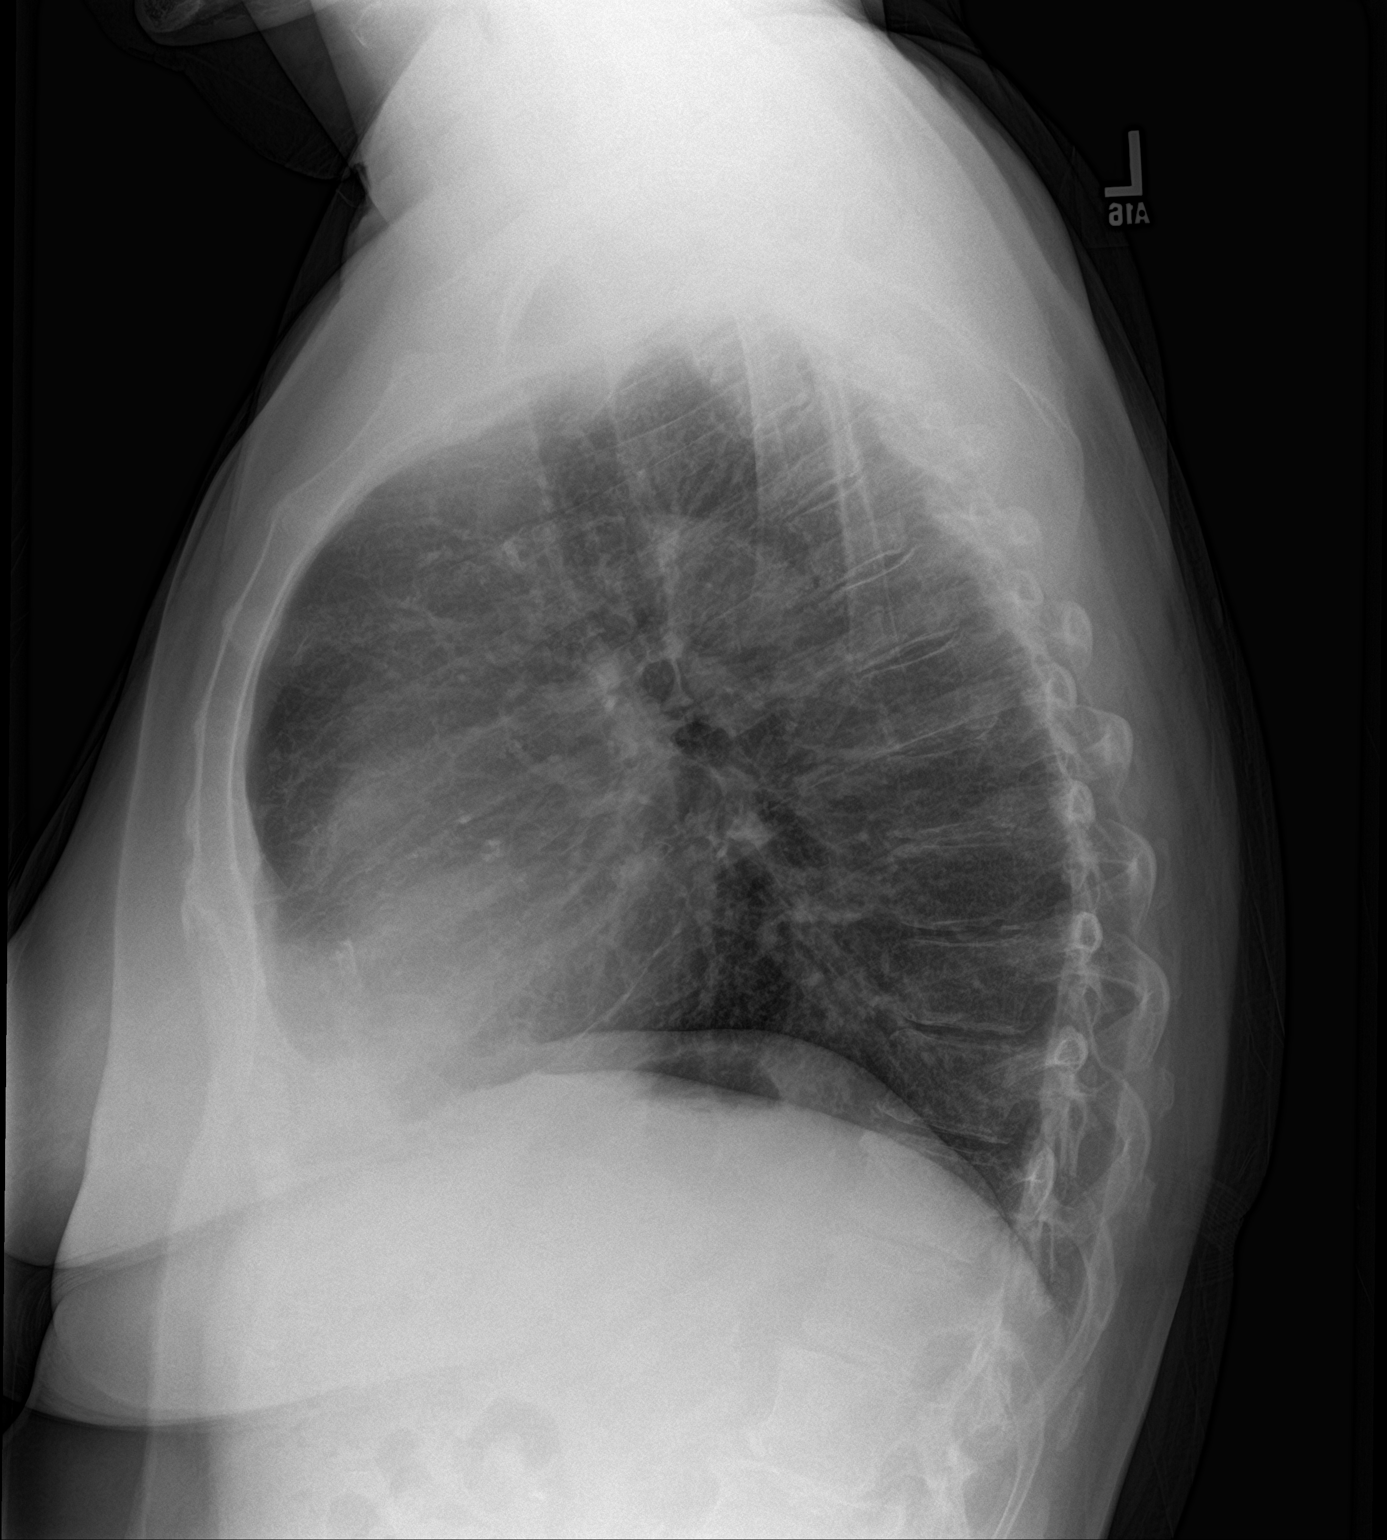

[2 of 2 positions shown; findings below may reference images not displayed]

FINDINGS: Cardiac silhouette is top-normal in size. No mediastinal or hilar
masses. No evidence of adenopathy.

Lungs are clear.  No pleural effusion.  No pneumothorax.

Skeletal structures are demineralized but intact.
IMPRESSION: No active cardiopulmonary disease.

## 2018-09-12 IMAGING — CR DG CHEST 1V PORT
1 series · 1 of 1 positions shown · non-contrast
Comparison: 06/04/2016

CLINICAL DATA: CABG

EXAM:
PORTABLE CHEST 1 VIEW

[AP]
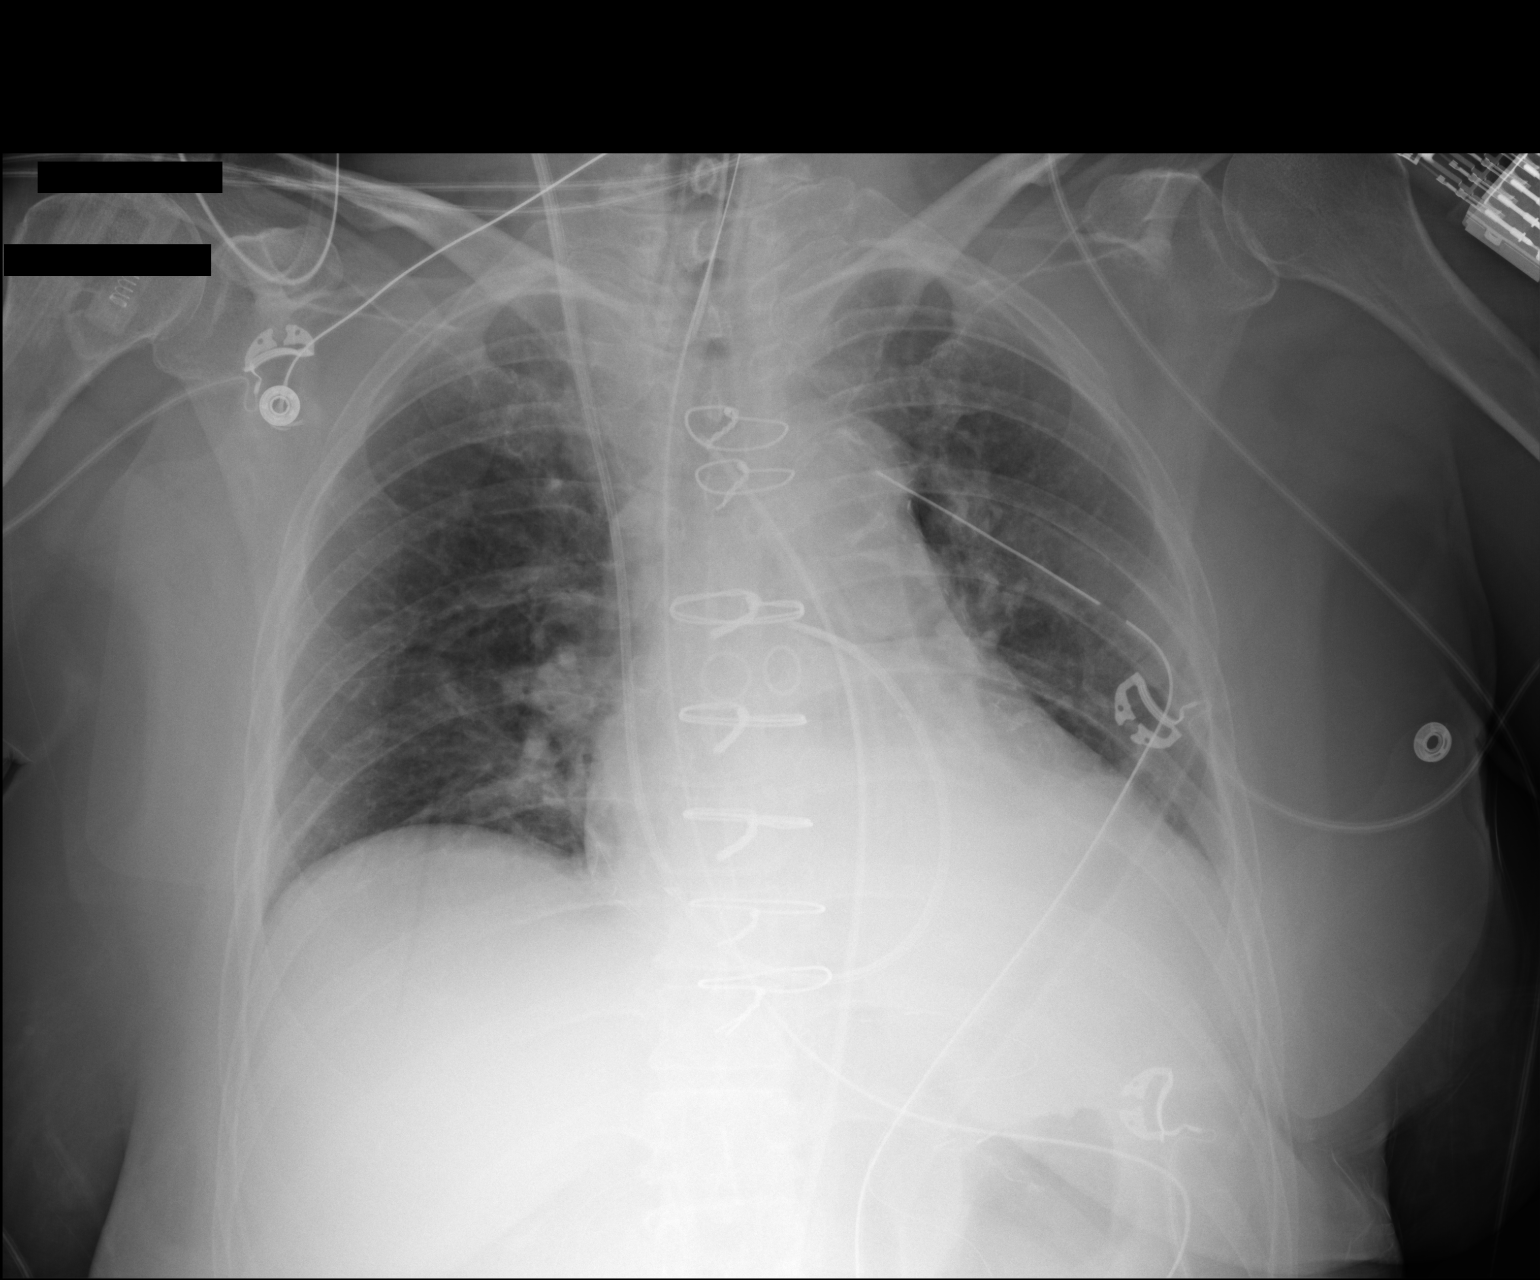

[1 of 1 positions shown; findings below may reference images not displayed]

FINDINGS: Endotracheal tube in good position. NG tube in the stomach.
Swan-Ganz catheter tip in the right pulmonary artery. Left chest
tube in place. Mediastinal drain in place.

No pneumothorax. Left lower lobe atelectasis. Minimal right lower
lobe atelectasis. Negative for pulmonary edema.
IMPRESSION: Support lines in satisfactory position.  No pneumothorax.

Bibasilar atelectasis left greater than right

## 2018-09-14 IMAGING — DX DG CHEST 1V PORT
1 series · 1 of 1 positions shown · non-contrast
Comparison: 06/18/2016

CLINICAL DATA: Status post coronary bypass grafting

EXAM:
PORTABLE CHEST 1 VIEW

[chest ap]
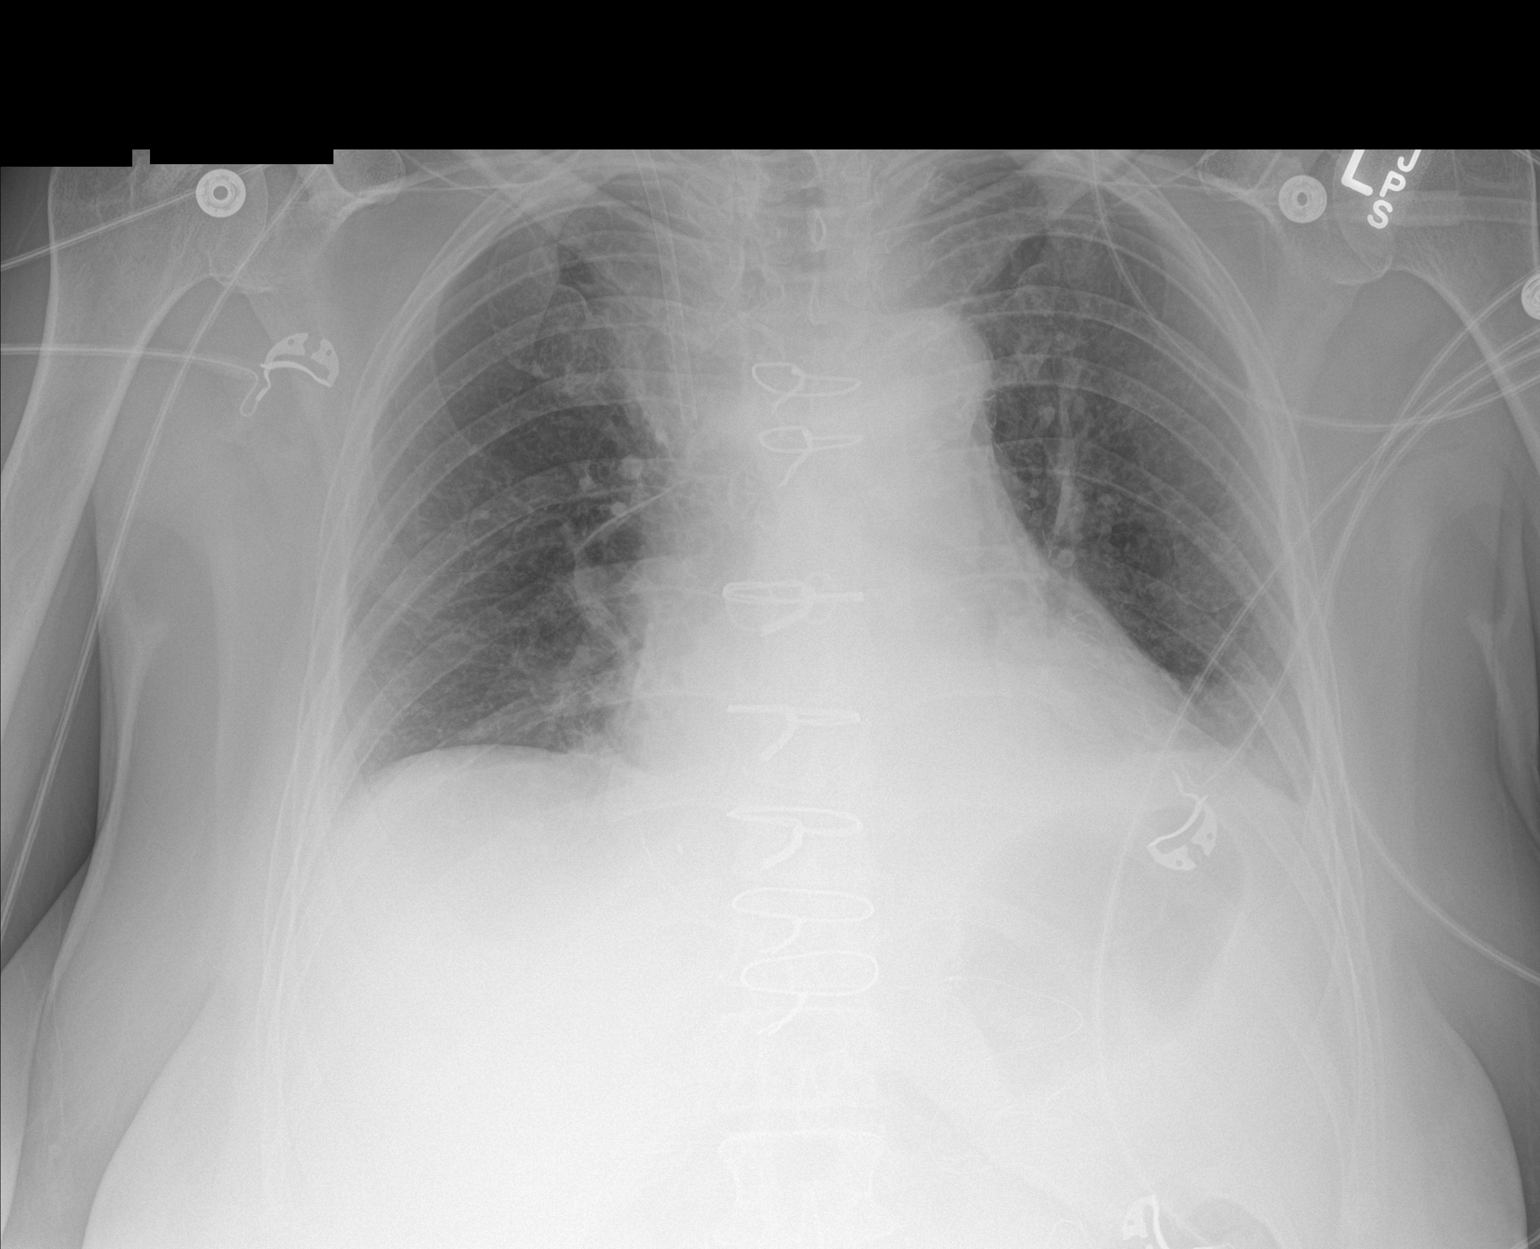

[1 of 1 positions shown; findings below may reference images not displayed]

FINDINGS: Cardiac shadow is mildly enlarged. A right jugular sheath is again
seen and stable. Swan-Ganz catheter, mediastinal drain and left
thoracostomy catheter have all been removed. No recurrent
pneumothorax is seen. Mild left basilar atelectasis is again noted.
The right lung is clear. Stable postsurgical changes.
IMPRESSION: Mild left basilar atelectasis.  No other focal abnormality is seen.

## 2018-09-15 IMAGING — DX DG CHEST 2V
2 series · 2 of 2 positions shown · non-contrast
Comparison: 06/19/2016

CLINICAL DATA: History CABG

EXAM:
CHEST  2 VIEW

[chest lat]
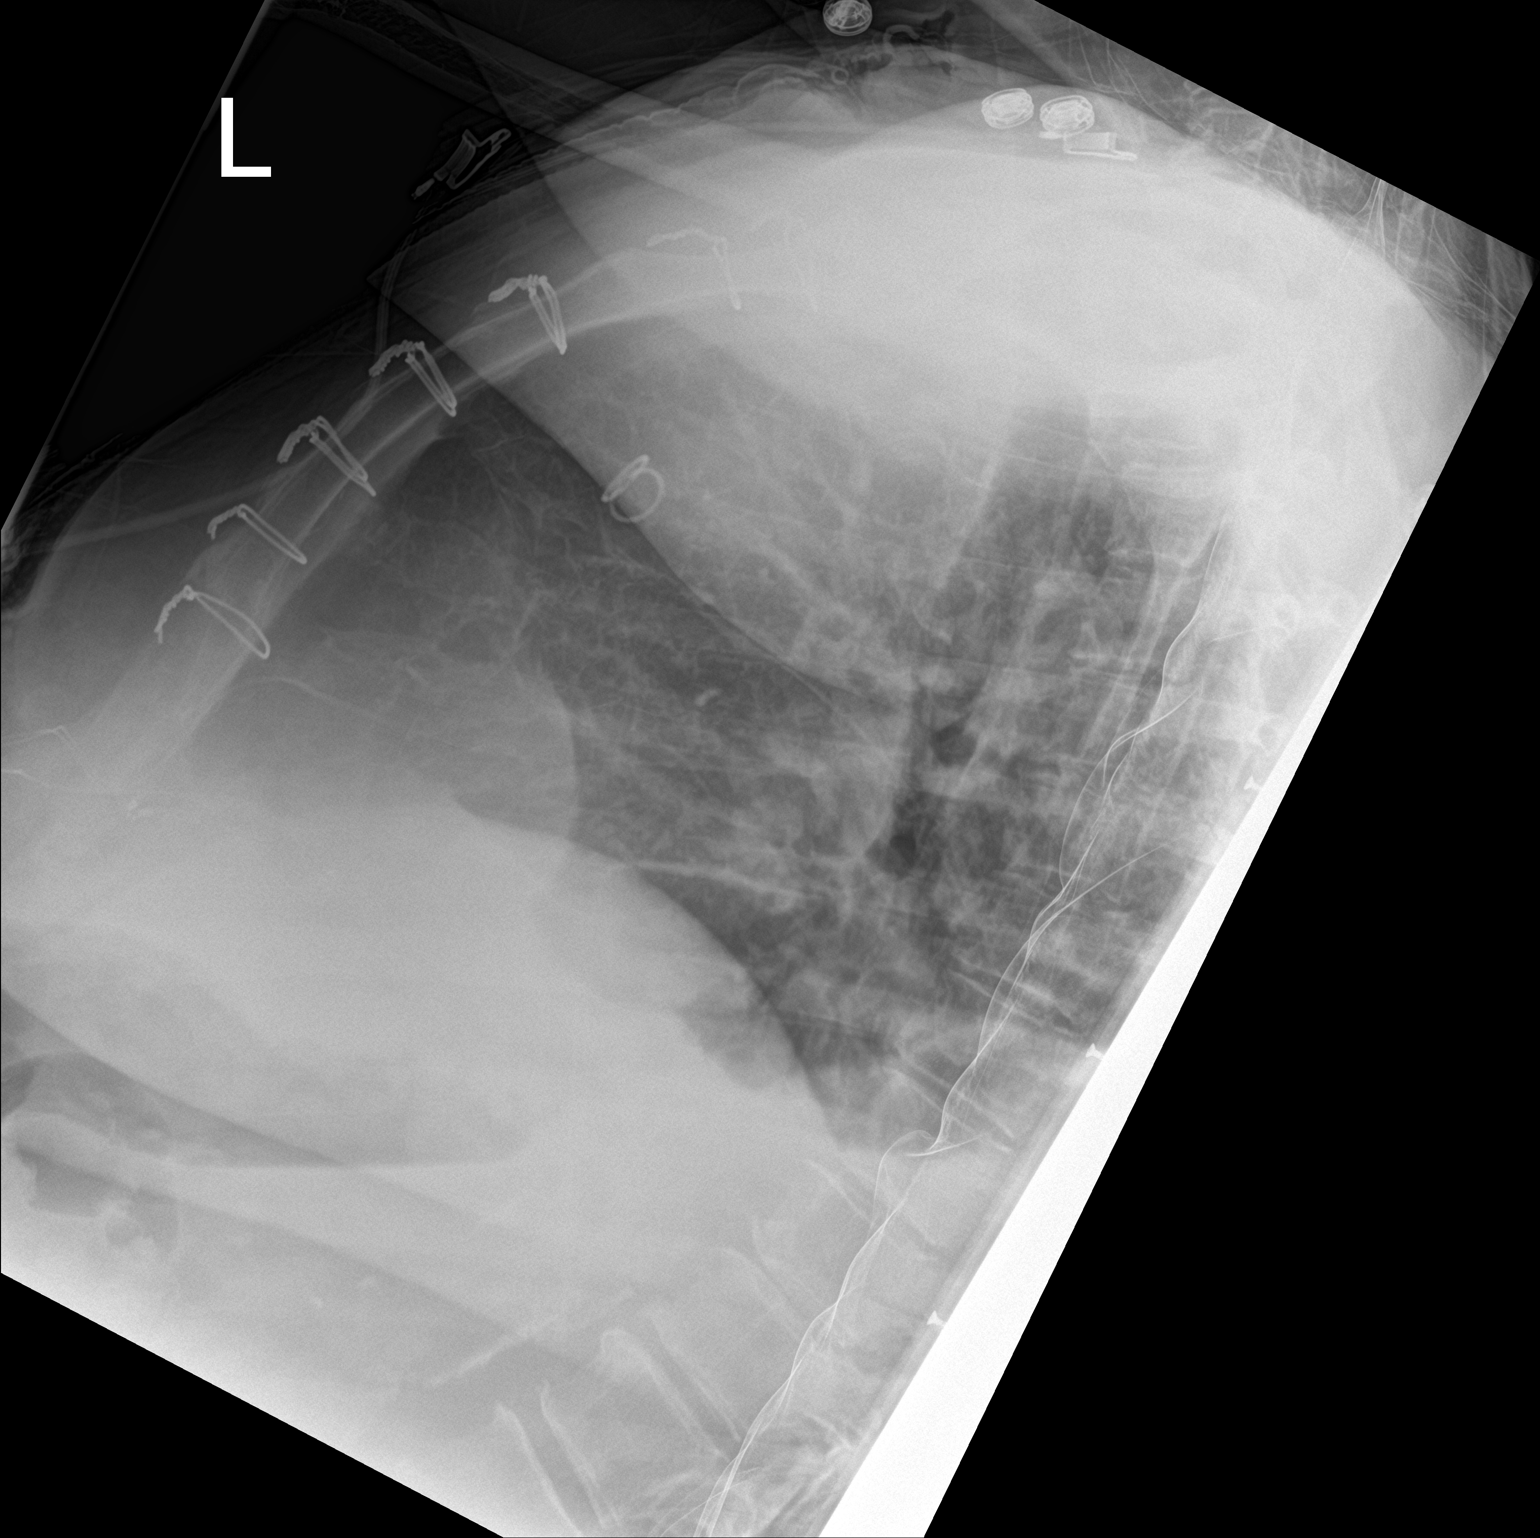

[chest ap]
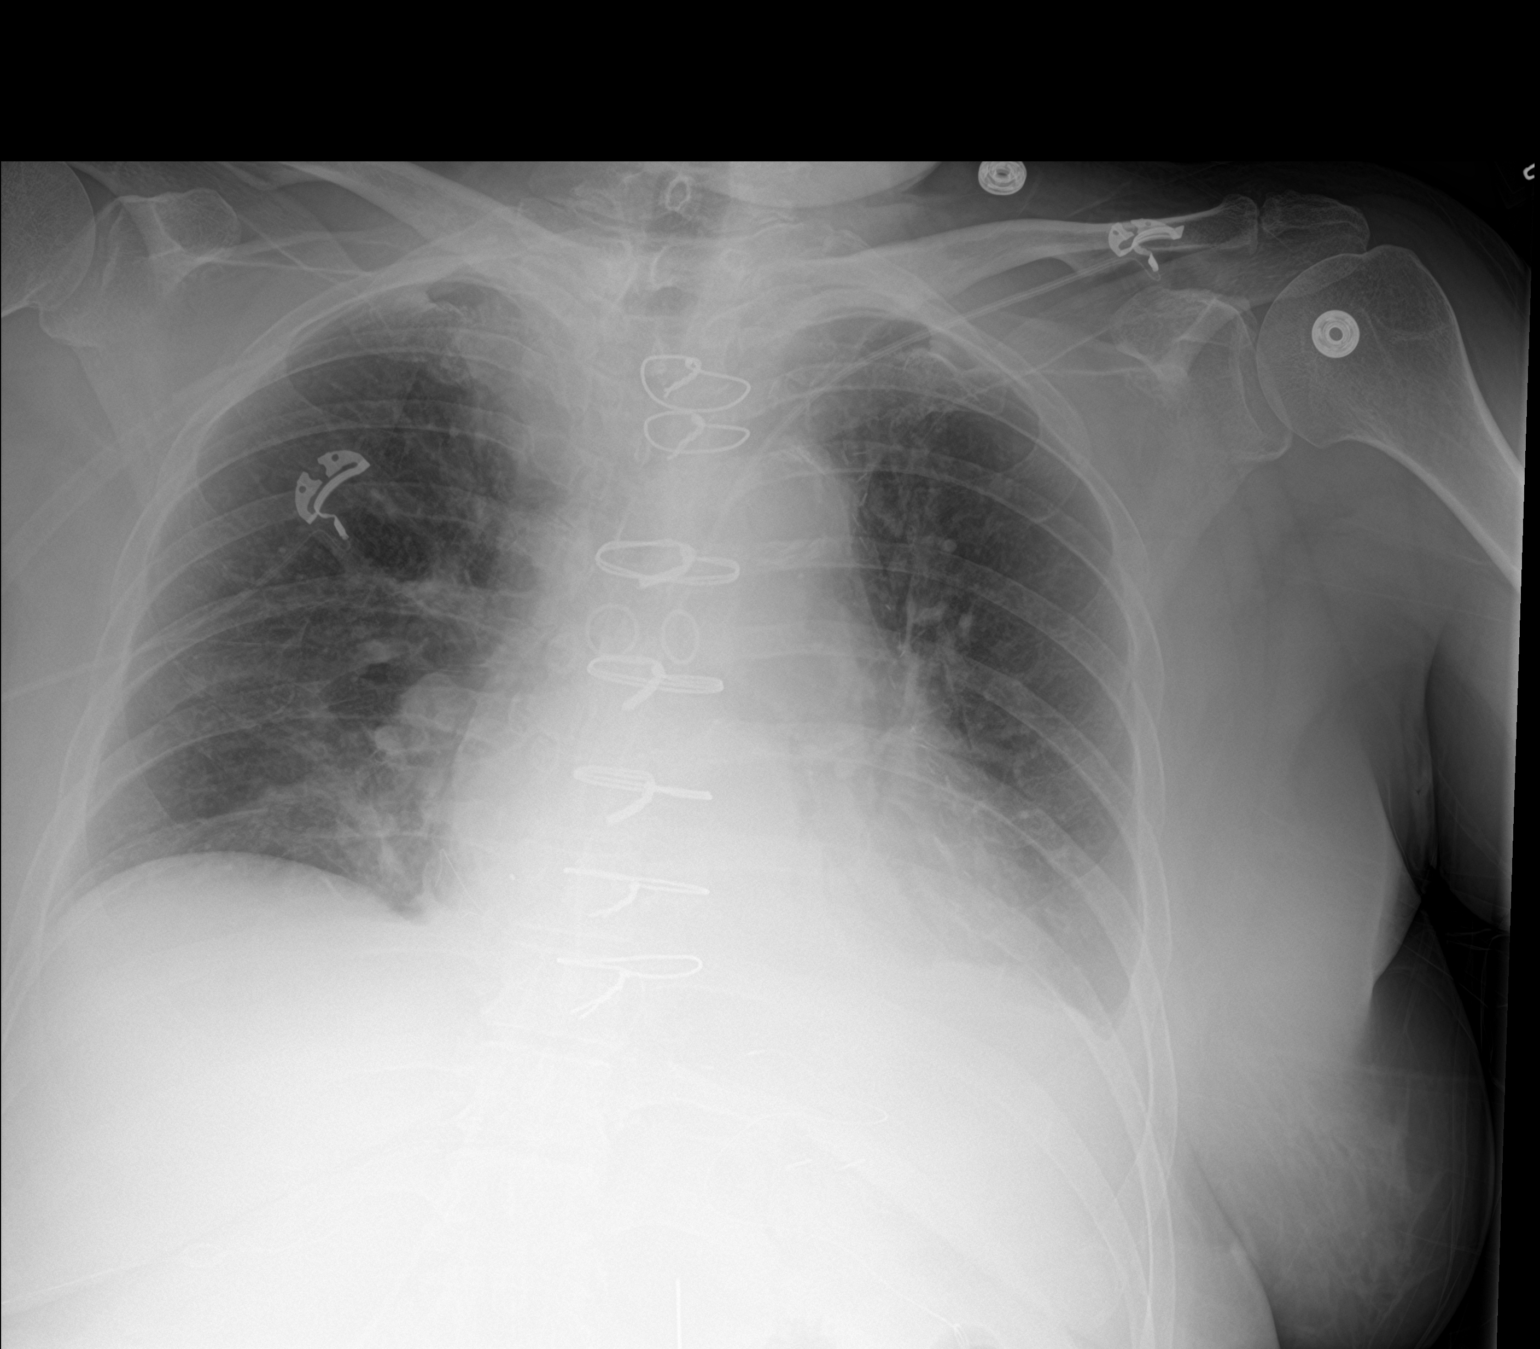

[2 of 2 positions shown; findings below may reference images not displayed]

FINDINGS: Right jugular sheath has been removed.  No pneumothorax.

Progression of bilateral airspace disease with lung haziness most
compatible with pulmonary edema. Small left effusion has enlarged.
No right effusion. Mild bibasilar atelectasis.
IMPRESSION: Progression of bilateral airspace disease most consistent with
edema. Progression of small left effusion.

Bibasilar atelectasis left greater than right

## 2018-10-19 ENCOUNTER — Other Ambulatory Visit: Payer: Self-pay | Admitting: Cardiovascular Disease

## 2018-10-19 ENCOUNTER — Other Ambulatory Visit: Payer: Self-pay | Admitting: Family Medicine

## 2018-10-19 ENCOUNTER — Encounter: Payer: Self-pay | Admitting: Family Medicine

## 2018-10-21 ENCOUNTER — Encounter: Payer: Self-pay | Admitting: Family Medicine

## 2018-10-21 ENCOUNTER — Ambulatory Visit (INDEPENDENT_AMBULATORY_CARE_PROVIDER_SITE_OTHER): Payer: Medicare HMO | Admitting: Family Medicine

## 2018-10-21 ENCOUNTER — Other Ambulatory Visit: Payer: Self-pay

## 2018-10-21 DIAGNOSIS — M25561 Pain in right knee: Secondary | ICD-10-CM | POA: Diagnosis not present

## 2018-10-21 DIAGNOSIS — I10 Essential (primary) hypertension: Secondary | ICD-10-CM

## 2018-10-21 DIAGNOSIS — G8929 Other chronic pain: Secondary | ICD-10-CM | POA: Diagnosis not present

## 2018-10-21 NOTE — Progress Notes (Signed)
Virtual Visit via Telephone Note  I connected with Donna Woodward on 10/21/18 at 10:00 AM EDT by telephone and verified that I am speaking with the correct person using two identifiers.   I discussed the limitations, risks, security and privacy concerns of performing an evaluation and management service by telephone and the availability of in person appointments. I also discussed with the patient that there may be a patient responsible charge related to this service. The patient expressed understanding and agreed to proceed.  Location patient: home Location provider: home office Participants present for the call: patient, provider Patient did not have a visit in the prior 7 days to address this/these issue(s).   History of Present Illness: Pt f/u on bp.  States it is ok.  Does not check regularly.  Not actively exercising, but gardening and walking around her house.  Plays fetch with the dog. Taking lisinopril-HCTZ and metoprolol.  Pt's roommate does most of the cooking.  States needs to increase po intake of water.    Pt has a friend living with her that has RA and has been on chemo.  She is worried about going out to the store and getting COVID-19.   Pt states she is wearing a face covering and washing her hands more when she has to go out.  Pt also notes R knee at times gives her problems.  States may have slight discomfort inferior lateral to the R patella.  Every once in a while the knee feels like it may "give out".  Denies injury, edema, erythema.  Was in PT in the past.  Social hx: Pt states her family is on the [employer].  Pt has a niece who is a CMA in geriatrics in [place] state. She has another niece in nursing school in [place]. Pt considering going back to school, interested in behavioral analysis, but not interested in the time it may take.   Thinkng about getting her PhD.   Observations/Objective: Patient sounds cheerful and well on the phone. I do not appreciate any  SOB. Speech and thought processing are grossly intact. Patient reported vitals:  Assessment and Plan: HTN -controlled -continue current meds: lisinopril-HCTZ 20-25 mg  and metoprolol XL 100 mg -discussed lifestyle modifications   Right knee pain, chronic -discussed ice, rest, NSAIDs prn -pt advised to restart stretching exercises learned in PT. -will consider imaging  Follow Up Instructions: Prn in the next 3-6 months  I did not refer this patient for an OV in the next 24 hours for this/these issue(s).  I discussed the assessment and treatment plan with the patient. The patient was provided an opportunity to ask questions and all were answered. The patient agreed with the plan and demonstrated an understanding of the instructions.   The patient was advised to call back or seek an in-person evaluation if the symptoms worsen or if the condition fails to improve as anticipated.  I provided 25 minutes of non-face-to-face time during this encounter.   Billie Ruddy, MD

## 2018-11-01 ENCOUNTER — Encounter: Payer: Self-pay | Admitting: Family Medicine

## 2018-11-02 ENCOUNTER — Encounter: Payer: Self-pay | Admitting: Family Medicine

## 2018-11-02 ENCOUNTER — Ambulatory Visit (INDEPENDENT_AMBULATORY_CARE_PROVIDER_SITE_OTHER): Payer: Medicare HMO | Admitting: Family Medicine

## 2018-11-02 ENCOUNTER — Other Ambulatory Visit: Payer: Self-pay

## 2018-11-02 DIAGNOSIS — F419 Anxiety disorder, unspecified: Secondary | ICD-10-CM

## 2018-11-02 NOTE — Progress Notes (Signed)
Virtual Visit via Video Note  I connected with Donna Woodward on 11/02/18 at  3:30 PM EDT by a video enabled telemedicine application and verified that I am speaking with the correct person using two identifiers.  Location patient: home Location provider:work or home office Persons participating in the virtual visit: patient, provider  I discussed the limitations of evaluation and management by telemedicine and the availability of in person appointments. The patient expressed understanding and agreed to proceed.   HPI: Pt endorses issues with her friend who is staying with her that started on Friday.  Pt states her friend was agrevating her by asking why she does certain things or puts certain things in certain places.  Pt states she felt like her chest was tight and her stress level increased.  Pt has been able to calm down some.  She tried to talk to her friend, but states the conversation goes nowhere.  Pt's friend has been living with her x 1 yr as has been unable to find housing.  Pt has will go out on the deck to get fresh air.   Pt denies HA, CP, changes in vision, numbness or tingling in face or arms.   ROS: See pertinent positives and negatives per HPI.  Past Medical History:  Diagnosis Date  . Anxiety   . Back pain   . Coronary artery disease    CABGx3; Seen by Dr. Johnsie Cancel  . Degenerative joint disease (DJD) of lumbar spine 03/24/2016  . Depression   . GERD (gastroesophageal reflux disease)    if overweight  . Hyperlipidemia   . Hypertension     Past Surgical History:  Procedure Laterality Date  . BACK SURGERY    . CARDIAC CATHETERIZATION N/A 06/05/2016   Procedure: Left Heart Cath and Coronary Angiography;  Surgeon: Belva Crome, MD;  Location: [facility];  Service: Cardiovascular;  Laterality: N/A;  . CORONARY ARTERY BYPASS GRAFT N/A 06/17/2016   Procedure: CORONARY ARTERY BYPASS GRAFTING (CABG), ON PUMP, TIMES THREE, USING LEFT INTERNAL MAMMARY ARTERY, RIGHT  GREATER SAPHENOUS VEIN HARVESTED ENDOSCOPICALLY;  Surgeon: Ivin Poot, MD;  Location: [facility];  Service: Open Heart Surgery;  Laterality: N/A;  . MULTIPLE TOOTH EXTRACTIONS    . [place]   lamnectomy L5S1  . TEE WITHOUT CARDIOVERSION N/A 06/17/2016   Procedure: TRANSESOPHAGEAL ECHOCARDIOGRAM (TEE);  Surgeon: Ivin Poot, MD;  Location: [facility];  Service: Open Heart Surgery;  Laterality: N/A;    Family History  Problem Relation Age of Onset  . Hypertension Mother   . Stroke Father   . Heart disease Father   . Dementia Father   . Hypertension Sister   . CAD Sister 43       cabg  . Hypertension Sister   . Cancer Sister 85       rectal  . 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   Current Outpatient Medications:  .  aspirin EC 81 MG tablet, Take 1 tablet (81 mg total) by mouth daily., Disp: , Rfl:  .  Calcium Carb-Cholecalciferol (HM CALCIUM-VITAMIN D) 600-800 MG-UNIT TABS, Take by mouth daily., Disp: , Rfl:  .  lisinopril-hydrochlorothiazide (ZESTORETIC) 20-25 MG tablet, TAKE 1 TABLET EVERY DAY, Disp: 90 tablet, Rfl: 0 .  metoprolol succinate (TOPROL-XL) 100 MG 24 hr tablet, TAKE 1 TABLET (100 MG TOTAL) DAILY. TAKE WITH OR IMMEDIATELY FOLLOWING A MEAL., Disp: 90 tablet, Rfl: 3 .  omega-3 fish oil (MAXEPA) 1000 MG CAPS capsule, Take 1 capsule (1,000 mg total) by mouth 2 (two) times daily. Reported on 11/14/2015, Disp: 180 each, Rfl: 1 .  simvastatin (ZOCOR) 40 MG tablet, Take 1 tablet (40 mg total) by mouth daily., Disp: 90 tablet, Rfl: 0  EXAM:  VITALS per patient if applicable: RR between 40-37 bpm  GENERAL: alert, oriented, appears well and in no acute distress  HEENT: atraumatic, conjunctiva clear, no obvious abnormalities on inspection of external nose and ears  NECK: normal movements of the head and neck  LUNGS: on inspection no signs of respiratory distress, breathing rate appears normal, no obvious gross SOB, gasping  or wheezing  CV: no obvious cyanosis  MS: moves all visible extremities without noticeable abnormality  PSYCH/NEURO: pleasant and cooperative, no obvious depression or anxiety, speech and thought processing grossly intact  ASSESSMENT AND PLAN:  Discussed the following assessment and plan:  Anxiety -stable -discussed ways to reduce stress and anxiety -pt declines medication at this time -discussed considering counseling -encouraged to try deep breathing -f/u prn in the next few wks for continued symptoms   I discussed the assessment and treatment plan with the patient. The patient was provided an opportunity to ask questions and all were answered. The patient agreed with the plan and demonstrated an understanding of the instructions.   The patient was advised to call back or seek an in-person evaluation if the symptoms worsen or if the condition fails to improve as anticipated.  Billie Ruddy, MD

## 2018-11-03 ENCOUNTER — Encounter: Payer: Medicare HMO | Admitting: Family Medicine

## 2018-11-05 ENCOUNTER — Other Ambulatory Visit: Payer: Self-pay

## 2018-11-05 MED ORDER — SIMVASTATIN 40 MG PO TABS: 40.0000 mg | ORAL_TABLET | Freq: Every day | ORAL | 1 refills | Status: DC

## 2018-12-24 ENCOUNTER — Encounter: Payer: Medicare HMO | Admitting: Family Medicine

## 2019-01-15 ENCOUNTER — Other Ambulatory Visit: Payer: Self-pay | Admitting: Family Medicine

## 2019-01-19 ENCOUNTER — Encounter: Payer: Self-pay | Admitting: Family Medicine

## 2019-01-19 ENCOUNTER — Ambulatory Visit (INDEPENDENT_AMBULATORY_CARE_PROVIDER_SITE_OTHER): Payer: Medicare HMO | Admitting: Family Medicine

## 2019-01-19 ENCOUNTER — Other Ambulatory Visit: Payer: Self-pay

## 2019-01-19 VITALS — BP 150/68 | HR 87 | Temp 97.5°F | Ht 64.0 in | Wt 168.2 lb

## 2019-01-19 DIAGNOSIS — Z131 Encounter for screening for diabetes mellitus: Secondary | ICD-10-CM

## 2019-01-19 DIAGNOSIS — M7022 Olecranon bursitis, left elbow: Secondary | ICD-10-CM | POA: Diagnosis not present

## 2019-01-19 DIAGNOSIS — I10 Essential (primary) hypertension: Secondary | ICD-10-CM

## 2019-01-19 DIAGNOSIS — E782 Mixed hyperlipidemia: Secondary | ICD-10-CM

## 2019-01-19 DIAGNOSIS — Z0000 Encounter for general adult medical examination without abnormal findings: Secondary | ICD-10-CM | POA: Diagnosis not present

## 2019-01-19 LAB — CBC WITH DIFFERENTIAL/PLATELET
Basophils Absolute: 0 10*3/uL (ref 0.0–0.1)
Basophils Relative: 0.3 % (ref 0.0–3.0)
Eosinophils Absolute: 0.1 10*3/uL (ref 0.0–0.7)
Eosinophils Relative: 1.5 % (ref 0.0–5.0)
HCT: 42.9 % (ref 36.0–46.0)
Hemoglobin: 14.2 g/dL (ref 12.0–15.0)
Lymphocytes Relative: 15.7 % (ref 12.0–46.0)
Lymphs Abs: 1.5 10*3/uL (ref 0.7–4.0)
MCHC: 33.1 g/dL (ref 30.0–36.0)
MCV: 86.3 fl (ref 78.0–100.0)
Monocytes Absolute: 0.6 10*3/uL (ref 0.1–1.0)
Monocytes Relative: 5.8 % (ref 3.0–12.0)
Neutro Abs: 7.6 10*3/uL (ref 1.4–7.7)
Neutrophils Relative %: 76.7 % (ref 43.0–77.0)
Platelets: 224 10*3/uL (ref 150.0–400.0)
RBC: 4.97 Mil/uL (ref 3.87–5.11)
RDW: 15.6 % — ABNORMAL HIGH (ref 11.5–15.5)
WBC: 9.9 10*3/uL (ref 4.0–10.5)

## 2019-01-19 LAB — LIPID PANEL
Cholesterol: 142 mg/dL (ref 0–200)
HDL: 29.8 mg/dL — ABNORMAL LOW (ref >39.00)
LDL Cholesterol: 86 mg/dL (ref 0–99)
NonHDL: 112.39
Total CHOL/HDL Ratio: 5
Triglycerides: 130 mg/dL (ref 0.0–149.0)
VLDL: 26 mg/dL (ref 0.0–40.0)

## 2019-01-19 LAB — BASIC METABOLIC PANEL
BUN: 10 mg/dL (ref 6–23)
CO2: 33 mEq/L — ABNORMAL HIGH (ref 19–32)
Calcium: 10.1 mg/dL (ref 8.4–10.5)
Chloride: 100 mEq/L (ref 96–112)
Creatinine, Ser: 0.66 mg/dL (ref 0.40–1.20)
GFR: 90.45 mL/min (ref >60.00)
Glucose, Bld: 85 mg/dL (ref 70–99)
Potassium: 3.2 mEq/L — ABNORMAL LOW (ref 3.5–5.1)
Sodium: 143 mEq/L (ref 135–145)

## 2019-01-19 LAB — HEMOGLOBIN A1C: Hgb A1c MFr Bld: 5.8 % (ref 4.6–6.5)

## 2019-01-19 NOTE — Progress Notes (Signed)
Subjective:     Donna Woodward is a 63 y.o. female and is here for a comprehensive physical exam. The patient reports problems - L elbow edema, chronic pain in back and knee.  Pt notes increased sitting while being quarantined at home has caused more knee and back pain.  Pt notes h/o L elbow bursitis and edema after a fall several yrs ago.  Pt notes recent L elbow edema after hitting her elbow on a table.  The edema is resolving, but there is peeling dry skin on the elbow.  Pt not checking bp at home.  Endorses med compliance .    Needs to schedule mammogram. [place] was 63 yo.  States was normal.  Social History   Socioeconomic History  . Marital status: Single    Spouse name: Not on file  . Number of children: Not on file  . Years of education: 36  . Highest education level: Not on file  Occupational History  . Occupation: disabled    Comment: Office manager  . Financial resource strain: Not on file  . Food insecurity    Worry: Not on file    Inability: Not on file  . Transportation needs    Medical: Not on file    Non-medical: Not on file  Tobacco Use  . Smoking status: Former Smoker    Quit date: 06/30/2006    Years since quitting: 12.5  . Smokeless tobacco: Never Used  Substance and Sexual Activity  . Alcohol use: Yes    Comment: rare  . Drug use: Yes    Types: Marijuana  . Sexual activity: Not Currently  Lifestyle  . Physical activity    Days per week: Not on file    Minutes per session: Not on file  . Stress: Not on file  Relationships  . Social Herbalist on phone: Not on file    Gets together: Not on file    Attends religious service: Not on file    Active member of club or organization: Not on file    Attends meetings of clubs or organizations: Not on file    Relationship status: Not on file  . Intimate partner violence    Fear of current or ex partner: Not on file    Emotionally abused: Not on  file    Physically abused: Not on file    Forced sexual activity: Not on file  Other Topics Concern  . 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   Grew up in [place] and lived here for since 2014.    Disabled with back pain.    Health Maintenance  Topic Date Due  . PAP SMEAR-Modifier  09/21/2014  . INFLUENZA VACCINE  01/29/2019  . MAMMOGRAM  05/08/2019  . COLONOSCOPY  06/30/2020  . TETANUS/TDAP  11/07/2027  . Hepatitis C Screening  Completed  . HIV Screening  Completed    The following portions of the patient's history were reviewed and updated as appropriate: allergies, current medications, past family history, past medical history, past social history, past surgical history and problem list.  Review of Systems Pertinent items noted in HPI and remainder of comprehensive ROS otherwise negative.   Objective:    BP (!) 150/68 (BP Location: Left Arm, Patient Position: Sitting, Cuff Size: Normal)   Pulse 87   Temp (!) 97.5 F (36.4  C) (Oral)   Ht 5\' 4"  (1.626 m)   Wt 168 lb 3.2 oz (76.3 kg)   SpO2 90%   BMI 28.87 kg/m  General appearance: alert, cooperative and no distress Head: Normocephalic, without obvious abnormality, atraumatic Eyes: conjunctivae/corneas clear. PERRL, EOM's intact. Fundi benign. Ears: normal TM's and external ear canals both ears Nose: Nares normal. Septum midline. Mucosa normal. No drainage or sinus tenderness. Throat: lips, mucosa, and tongue normal; teeth and gums normal Neck: no adenopathy, no carotid bruit, no JVD, supple, symmetrical, trachea midline and thyroid not enlarged, symmetric, no tenderness/mass/nodules Lungs: clear to auscultation bilaterally Heart: regular rate and rhythm, S1, S2 normal, no murmur, click, rub or gallop Abdomen: soft, non-tender; bowel sounds normal; no masses,  no organomegaly Extremities: extremities normal, atraumatic, no cyanosis or edema L elbow with mild  effusion and area of desquaminatinon/dried skin.  R elbow with 1 cm area of peeling dried skin. Pulses: 2+ and symmetric Skin: Skin color, texture, turgor normal. No rashes or lesions Lymph nodes: Cervical, supraclavicular, and axillary nodes normal. Neurologic: Alert and oriented X 3, normal strength and tone. Normal symmetric reflexes. Normal coordination and gait    Assessment:    Healthy female exam with burisitis, L elbow and elebated BP.     Plan:     Anticipatory guidance given including wearing seatbelts, smoke detectors in the home, increasing physical activity, increasing p.o. intake of water and vegetables. -will obtain labs -pap declined -pt to schedule mammogram -colonoscopy due next yr (2021) See After Visit Summary for Counseling Recommendations    HTN -elevated 160/98 -recheck 150/68 -discussed lifestyle modifications -will obtain BMP -pt hesitant to increase bp meds. -f/u in 1 month  Olecranon bursitis -ice, aspercream  HLD -lipid panel -continue simvastatin 40 mg daily  F/u in 1 month for HTN  Grier Mitts, MD

## 2019-01-19 NOTE — Patient Instructions (Signed)
Preventive Care 40-64 Years Old, Female Preventive care refers to visits with your health care provider and lifestyle choices that can promote health and wellness. This includes:  A yearly physical exam. This may also be called an annual well check.  Regular dental visits and eye exams.  Immunizations.  Screening for certain conditions.  Healthy lifestyle choices, such as eating a healthy diet, getting regular exercise, not using drugs or products that contain nicotine and tobacco, and limiting alcohol use. What can I expect for my preventive care visit? Physical exam Your health care provider will check your:  Height and weight. This may be used to calculate body mass index (BMI), which tells if you are at a healthy weight.  Heart rate and blood pressure.  Skin for abnormal spots. Counseling Your health care provider may ask you questions about your:  Alcohol, tobacco, and drug use.  Emotional well-being.  Home and relationship well-being.  Sexual activity.  Eating habits.  Work and work environment.  Method of birth control.  Menstrual cycle.  Pregnancy history. What immunizations do I need?  Influenza (flu) vaccine  This is recommended every year. Tetanus, diphtheria, and pertussis (Tdap) vaccine  You may need a Td booster every 10 years. Varicella (chickenpox) vaccine  You may need this if you have not been vaccinated. Zoster (shingles) vaccine  You may need this after age 60. Measles, mumps, and rubella (MMR) vaccine  You may need at least one dose of MMR if you were born in 1957 or later. You may also need a second dose. Pneumococcal conjugate (PCV13) vaccine  You may need this if you have certain conditions and were not previously vaccinated. Pneumococcal polysaccharide (PPSV23) vaccine  You may need one or two doses if you smoke cigarettes or if you have certain conditions. Meningococcal conjugate (MenACWY) vaccine  You may need this if you  have certain conditions. Hepatitis A vaccine  You may need this if you have certain conditions or if you travel or work in places where you may be exposed to hepatitis A. Hepatitis B vaccine  You may need this if you have certain conditions or if you travel or work in places where you may be exposed to hepatitis B. Haemophilus influenzae type b (Hib) vaccine  You may need this if you have certain conditions. Human papillomavirus (HPV) vaccine  If recommended by your health care provider, you may need three doses over 6 months. You may receive vaccines as individual doses or as more than one vaccine together in one shot (combination vaccines). Talk with your health care provider about the risks and benefits of combination vaccines. What tests do I need? Blood tests  Lipid and cholesterol levels. These may be checked every 5 years, or more frequently if you are over 50 years old.  Hepatitis C test.  Hepatitis B test. Screening  Lung cancer screening. You may have this screening every year starting at age 55 if you have a 30-pack-year history of smoking and currently smoke or have quit within the past 15 years.  Colorectal cancer screening. All adults should have this screening starting at age 50 and continuing until age 75. Your health care provider may recommend screening at age 45 if you are at increased risk. You will have tests every 1-10 years, depending on your results and the type of screening test.  Diabetes screening. This is done by checking your blood sugar (glucose) after you have not eaten for a while (fasting). You may have this   done every 1-3 years.  Mammogram. This may be done every 1-2 years. Talk with your health care provider about when you should start having regular mammograms. This may depend on whether you have a family history of breast cancer.  BRCA-related cancer screening. This may be done if you have a family history of breast, ovarian, tubal, or peritoneal  cancers.  Pelvic exam and Pap test. This may be done every 3 years starting at age 56. Starting at age 45, this may be done every 5 years if you have a Pap test in combination with an HPV test. Other tests  Sexually transmitted disease (STD) testing.  Bone density scan. This is done to screen for osteoporosis. You may have this scan if you are at high risk for osteoporosis. Follow these instructions at home: Eating and drinking  Eat a diet that includes fresh fruits and vegetables, whole grains, lean protein, and low-fat dairy.  Take vitamin and mineral supplements as recommended by your health care provider.  Do not drink alcohol if: ? Your health care provider tells you not to drink. ? You are pregnant, may be pregnant, or are planning to become pregnant.  If you drink alcohol: ? Limit how much you have to 0-1 drink a day. ? Be aware of how much alcohol is in your drink. In the [place] one drink equals one 12 oz bottle of beer (355 mL), one 5 oz glass of wine (148 mL), or one 1 oz glass of hard liquor (44 mL). Lifestyle  Take daily care of your teeth and gums.  Stay active. Exercise for at least 30 minutes on 5 or more days each week.  Do not use any products that contain nicotine or tobacco, such as cigarettes, e-cigarettes, and chewing tobacco. If you need help quitting, ask your health care provider.  If you are sexually active, practice safe sex. Use a condom or other form of birth control (contraception) in order to prevent pregnancy and STIs (sexually transmitted infections).  If told by your health care provider, take low-dose aspirin daily starting at age 52. What's next?  Visit your health care provider once a year for a well check visit.  Ask your health care provider how often you should have your eyes and teeth checked.  Stay up to date on all vaccines. This information is not intended to replace advice given to you by your health care provider. Make sure you  discuss any questions you have with your health care provider. Document Released: 07/13/2015 Document Revised: 02/25/2018 Document Reviewed: 02/25/2018 Elsevier Patient Education  2020 [employer] Your Hypertension Hypertension is commonly called high blood pressure. This is when the force of your blood pressing against the walls of your arteries is too strong. Arteries are blood vessels that carry blood from your heart throughout your body. Hypertension forces the heart to work harder to pump blood, and may cause the arteries to become narrow or stiff. Having untreated or uncontrolled hypertension can cause heart attack, stroke, kidney disease, and other problems. What are blood pressure readings? A blood pressure reading consists of a higher number over a lower number. Ideally, your blood pressure should be below 120/80. The first ("top") number is called the systolic pressure. It is a measure of the pressure in your arteries as your heart beats. The second ("bottom") number is called the diastolic pressure. It is a measure of the pressure in your arteries as the heart relaxes. What does my blood pressure reading  mean? Blood pressure is classified into four stages. Based on your blood pressure reading, your health care provider may use the following stages to determine what type of treatment you need, if any. Systolic pressure and diastolic pressure are measured in a unit called mm Hg. Normal  Systolic pressure: below 409.  Diastolic pressure: below 80. Elevated  Systolic pressure: 811-914.  Diastolic pressure: below 80. Hypertension stage 1  Systolic pressure: 782-956.  Diastolic pressure: 21-30. Hypertension stage 2  Systolic pressure: 865 or above.  Diastolic pressure: 90 or above. What health risks are associated with hypertension? Managing your hypertension is an important responsibility. Uncontrolled hypertension can lead to:  A heart attack.  A stroke.  A  weakened blood vessel (aneurysm).  Heart failure.  Kidney damage.  Eye damage.  Metabolic syndrome.  Memory and concentration problems. What changes can I make to manage my hypertension? Hypertension can be managed by making lifestyle changes and possibly by taking medicines. Your health care provider will help you make a plan to bring your blood pressure within a normal range. Eating and drinking   Eat a diet that is high in fiber and potassium, and low in salt (sodium), added sugar, and fat. An example eating plan is called the DASH (Dietary Approaches to Stop Hypertension) diet. To eat this way: ? Eat plenty of fresh fruits and vegetables. Try to fill half of your plate at each meal with fruits and vegetables. ? Eat whole grains, such as whole wheat pasta, brown rice, or whole grain bread. Fill about one quarter of your plate with whole grains. ? Eat low-fat diary products. ? Avoid fatty cuts of meat, processed or cured meats, and poultry with skin. Fill about one quarter of your plate with lean proteins such as fish, chicken without skin, beans, eggs, and tofu. ? Avoid premade and processed foods. These tend to be higher in sodium, added sugar, and fat.  Reduce your daily sodium intake. Most people with hypertension should eat less than 1,500 mg of sodium a day.  Limit alcohol intake to no more than 1 drink a day for nonpregnant women and 2 drinks a day for men. One drink equals 12 oz of beer, 5 oz of wine, or 1 oz of hard liquor. Lifestyle  Work with your health care provider to maintain a healthy body weight, or to lose weight. Ask what an ideal weight is for you.  Get at least 30 minutes of exercise that causes your heart to beat faster (aerobic exercise) most days of the week. Activities may include walking, swimming, or biking.  Include exercise to strengthen your muscles (resistance exercise), such as weight lifting, as part of your weekly exercise routine. Try to do these  types of exercises for 30 minutes at least 3 days a week.  Do not use any products that contain nicotine or tobacco, such as cigarettes and e-cigarettes. If you need help quitting, ask your health care provider.  Control any long-term (chronic) conditions you have, such as high cholesterol or diabetes. Monitoring  Monitor your blood pressure at home as told by your health care provider. Your personal target blood pressure may vary depending on your medical conditions, your age, and other factors.  Have your blood pressure checked regularly, as often as told by your health care provider. Working with your health care provider  Review all the medicines you take with your health care provider because there may be side effects or interactions.  Talk with your health care  provider about your diet, exercise habits, and other lifestyle factors that may be contributing to hypertension.  Visit your health care provider regularly. Your health care provider can help you create and adjust your plan for managing hypertension. Will I need medicine to control my blood pressure? Your health care provider may prescribe medicine if lifestyle changes are not enough to get your blood pressure under control, and if:  Your systolic blood pressure is 130 or higher.  Your diastolic blood pressure is 80 or higher. Take medicines only as told by your health care provider. Follow the directions carefully. Blood pressure medicines must be taken as prescribed. The medicine does not work as well when you skip doses. Skipping doses also puts you at risk for problems. Contact a health care provider if:  You think you are having a reaction to medicines you have taken.  You have repeated (recurrent) headaches.  You feel dizzy.  You have swelling in your ankles.  You have trouble with your vision. Get help right away if:  You develop a severe headache or confusion.  You have unusual weakness or numbness, or you  feel faint.  You have severe pain in your chest or abdomen.  You vomit repeatedly.  You have trouble breathing. Summary  Hypertension is when the force of blood pumping through your arteries is too strong. If this condition is not controlled, it may put you at risk for serious complications.  Your personal target blood pressure may vary depending on your medical conditions, your age, and other factors. For most people, a normal blood pressure is less than 120/80.  Hypertension is managed by lifestyle changes, medicines, or both. Lifestyle changes include weight loss, eating a healthy, low-sodium diet, exercising more, and limiting alcohol. This information is not intended to replace advice given to you by your health care provider. Make sure you discuss any questions you have with your health care provider. Document Released: 03/10/2012 Document Revised: 10/08/2018 Document Reviewed: 05/14/2016 Elsevier Patient Education  2020 [employer].

## 2019-01-20 ENCOUNTER — Other Ambulatory Visit: Payer: Self-pay | Admitting: Family Medicine

## 2019-01-20 ENCOUNTER — Encounter: Payer: Self-pay | Admitting: Family Medicine

## 2019-01-20 MED ORDER — POTASSIUM CHLORIDE CRYS ER 20 MEQ PO TBCR: EXTENDED_RELEASE_TABLET | ORAL | 0 refills | Status: DC

## 2019-01-21 ENCOUNTER — Other Ambulatory Visit: Payer: Self-pay | Admitting: *Deleted

## 2019-01-21 DIAGNOSIS — E876 Hypokalemia: Secondary | ICD-10-CM

## 2019-02-07 ENCOUNTER — Encounter: Payer: Self-pay | Admitting: Family Medicine

## 2019-02-09 ENCOUNTER — Encounter: Payer: Self-pay | Admitting: Family Medicine

## 2019-02-09 ENCOUNTER — Other Ambulatory Visit: Payer: Self-pay

## 2019-02-09 ENCOUNTER — Telehealth (INDEPENDENT_AMBULATORY_CARE_PROVIDER_SITE_OTHER): Payer: Medicare HMO | Admitting: Family Medicine

## 2019-02-09 DIAGNOSIS — M545 Low back pain: Secondary | ICD-10-CM | POA: Diagnosis not present

## 2019-02-09 DIAGNOSIS — G8929 Other chronic pain: Secondary | ICD-10-CM

## 2019-02-09 MED ORDER — CYCLOBENZAPRINE HCL 5 MG PO TABS: ORAL_TABLET | ORAL | 1 refills | Status: DC

## 2019-02-09 NOTE — Progress Notes (Signed)
This visit type was conducted due to national recommendations for restrictions regarding the COVID-19 pandemic in an effort to limit this patient's exposure and mitigate transmission in our community.   Virtual Visit via Video Note  I connected with Donna Woodward on 02/09/19 at 11:45 AM EDT by a video enabled telemedicine application and verified that I am speaking with the correct person using two identifiers.  Location patient: home Location provider:work or home office Persons participating in the virtual visit: patient, provider  I discussed the limitations of evaluation and management by telemedicine and the availability of in person appointments. The patient expressed understanding and agreed to proceed.   HPI: Patient relates 1 month history of what has been recurrent and chronic back pain.  Her pain is lower lumbar area right greater than left.  Denies any radiculitis symptoms.  No recent injury.  She has sensation of muscle spasm and muscles tightening up early in the morning especially.  She has occasional tingling sensation left foot but denies any lower extremity weakness.  No urine or stool incontinence.  Pain is moderate at times.  Sometimes worse with walking.  She is tried heat, ice, and Tylenol with minimal relief.  She does relate remote history of back surgery around 1990.  She had plain films lumbar spine 2014 which showed some diffuse degenerative changes.  Denies any recent fever, chills, dysuria, or any appetite or weight changes.   ROS: See pertinent positives and negatives per HPI.  Past Medical History:  Diagnosis Date  . Anxiety   . Back pain   . Coronary artery disease    CABGx3; Seen by Dr. Johnsie Cancel  . Degenerative joint disease (DJD) of lumbar spine 03/24/2016  . Depression   . GERD (gastroesophageal reflux disease)    if overweight  . Hyperlipidemia   . Hypertension     Past Surgical History:  Procedure Laterality Date  . BACK SURGERY    . CARDIAC  CATHETERIZATION N/A 06/05/2016   Procedure: Left Heart Cath and Coronary Angiography;  Surgeon: Belva Crome, MD;  Location: [facility];  Service: Cardiovascular;  Laterality: N/A;  . CORONARY ARTERY BYPASS GRAFT N/A 06/17/2016   Procedure: CORONARY ARTERY BYPASS GRAFTING (CABG), ON PUMP, TIMES THREE, USING LEFT INTERNAL MAMMARY ARTERY, RIGHT GREATER SAPHENOUS VEIN HARVESTED ENDOSCOPICALLY;  Surgeon: Ivin Poot, MD;  Location: [facility];  Service: Open Heart Surgery;  Laterality: N/A;  . MULTIPLE TOOTH EXTRACTIONS    . [place]   lamnectomy L5S1  . TEE WITHOUT CARDIOVERSION N/A 06/17/2016   Procedure: TRANSESOPHAGEAL ECHOCARDIOGRAM (TEE);  Surgeon: Ivin Poot, MD;  Location: [facility];  Service: Open Heart Surgery;  Laterality: N/A;    Family History  Problem Relation Age of Onset  . Hypertension Mother   . Stroke Father   . Heart disease Father   . Dementia Father   . Hypertension Sister   . CAD Sister 40       cabg  . Hypertension Sister   . Cancer Sister 60       rectal  . Hypertension Sister     SOCIAL HX: Quit smoking 2008   Current Outpatient Medications:  .  aspirin EC 81 MG tablet, Take 1 tablet (81 mg total) by mouth daily. (Patient not taking: Reported on 01/19/2019), Disp: , Rfl:  .  Calcium Carb-Cholecalciferol (HM CALCIUM-VITAMIN D) 600-800 MG-UNIT TABS, Take by mouth daily., Disp: , Rfl:  .  cyclobenzaprine (FLEXERIL) 5 MG tablet, Take one  to two po qhs prn muscle spasm, Disp: 30 tablet, Rfl: 1 .  lisinopril-hydrochlorothiazide (ZESTORETIC) 20-25 MG tablet, TAKE 1 TABLET EVERY DAY (Patient not taking: Reported on 01/19/2019), Disp: 90 tablet, Rfl: 0 .  metoprolol succinate (TOPROL-XL) 100 MG 24 hr tablet, TAKE 1 TABLET (100 MG TOTAL) DAILY. TAKE WITH OR IMMEDIATELY FOLLOWING A MEAL. (Patient not taking: Reported on 01/19/2019), Disp: 90 tablet, Rfl: 3 .  omega-3 fish oil (MAXEPA) 1000 MG CAPS capsule, Take 1 capsule (1,000 mg total) by mouth 2 (two)  times daily. Reported on 11/14/2015 (Patient not taking: Reported on 01/19/2019), Disp: 180 each, Rfl: 1 .  potassium chloride SA (K-DUR) 20 MEQ tablet, Take 2 pills daily for 2 days.  Then take 1 pill daily., Disp: 30 tablet, Rfl: 0 .  simvastatin (ZOCOR) 40 MG tablet, Take 1 tablet (40 mg total) by mouth daily. (Patient not taking: Reported on 01/19/2019), Disp: 90 tablet, Rfl: 1  EXAM:  VITALS per patient if applicable:  GENERAL: alert, oriented, appears well and in no acute distress  HEENT: atraumatic, conjunttiva clear, no obvious abnormalities on inspection of external nose and ears  NECK: normal movements of the head and neck  LUNGS: on inspection no signs of respiratory distress, breathing rate appears normal, no obvious gross SOB, gasping or wheezing  CV: no obvious cyanosis  MS: moves all visible extremities without noticeable abnormality  PSYCH/NEURO: pleasant and cooperative, no obvious depression or anxiety, speech and thought processing grossly intact  ASSESSMENT AND PLAN:  Discussed the following assessment and plan:  Lower lumbar back pain.  This has been recurrent and chronic.  Current episode for about 1 month.  Other than some mild intermittent paresthesias left foot no neurologic deficits.  -Agree to low-dose Flexeril 5 mg nightly.  She is aware of potential sedation. -Continue heat or ice for symptomatic relief -Back stretches as tolerated -Follow-up with primary in 1 to 2 weeks if not improving   I discussed the assessment and treatment plan with the patient. The patient was provided an opportunity to ask questions and all were answered. The patient agreed with the plan and demonstrated an understanding of the instructions.   The patient was advised to call back or seek an in-person evaluation if the symptoms worsen or if the condition fails to improve as anticipated.    Carolann Littler, MD

## 2019-02-18 ENCOUNTER — Other Ambulatory Visit: Payer: Self-pay

## 2019-02-18 ENCOUNTER — Emergency Department (HOSPITAL_COMMUNITY)
Admission: EM | Admit: 2019-02-18 | Discharge: 2019-02-18 | Disposition: A | Discharge status: Home / Self Care | Payer: Medicare HMO | Attending: Emergency Medicine | Admitting: Emergency Medicine

## 2019-02-18 DIAGNOSIS — G8929 Other chronic pain: Secondary | ICD-10-CM | POA: Insufficient documentation

## 2019-02-18 DIAGNOSIS — I2510 Atherosclerotic heart disease of native coronary artery without angina pectoris: Secondary | ICD-10-CM | POA: Diagnosis not present

## 2019-02-18 DIAGNOSIS — I10 Essential (primary) hypertension: Secondary | ICD-10-CM | POA: Insufficient documentation

## 2019-02-18 DIAGNOSIS — M545 Low back pain, unspecified: Secondary | ICD-10-CM

## 2019-02-18 DIAGNOSIS — Z79899 Other long term (current) drug therapy: Secondary | ICD-10-CM | POA: Diagnosis not present

## 2019-02-18 DIAGNOSIS — Z951 Presence of aortocoronary bypass graft: Secondary | ICD-10-CM | POA: Diagnosis not present

## 2019-02-18 DIAGNOSIS — Z87891 Personal history of nicotine dependence: Secondary | ICD-10-CM | POA: Diagnosis not present

## 2019-02-18 LAB — URINALYSIS, ROUTINE W REFLEX MICROSCOPIC
Bacteria, UA: NONE SEEN
Bilirubin Urine: NEGATIVE
Glucose, UA: NEGATIVE mg/dL
Ketones, ur: NEGATIVE mg/dL
Leukocytes,Ua: NEGATIVE
Nitrite: NEGATIVE
Protein, ur: NEGATIVE mg/dL
Specific Gravity, Urine: 1.018 (ref 1.005–1.030)
pH: 6 (ref 5.0–8.0)

## 2019-02-18 MED ORDER — DIAZEPAM 5 MG/ML IJ SOLN
5.0000 mg | Freq: Once | INTRAMUSCULAR | Status: AC
  Administered 2019-02-18: 5 mg via INTRAMUSCULAR
  Filled 2019-02-18: qty 2

## 2019-02-18 MED ORDER — KETOROLAC TROMETHAMINE 30 MG/ML IJ SOLN
30.0000 mg | Freq: Once | INTRAMUSCULAR | Status: AC
  Administered 2019-02-18: 30 mg via INTRAMUSCULAR
  Filled 2019-02-18: qty 1

## 2019-02-18 MED ORDER — HYDROCODONE-ACETAMINOPHEN 5-325 MG PO TABS
1.0000 | ORAL_TABLET | Freq: Once | ORAL | Status: AC
  Administered 2019-02-18: 1 via ORAL
  Filled 2019-02-18: qty 1

## 2019-02-18 MED ORDER — TRAMADOL HCL 50 MG PO TABS: 50.0000 mg | ORAL_TABLET | Freq: Three times a day (TID) | ORAL | 0 refills | Status: DC | PRN

## 2019-02-18 MED ORDER — DICLOFENAC SODIUM 1 % TD GEL: 2.0000 g | Freq: Four times a day (QID) | TRANSDERMAL | 0 refills | Status: DC

## 2019-02-18 NOTE — Discharge Instructions (Signed)
Please read instructions below.  You can take tylenol/acetaminophen every 6 hours as needed for pain.   You can take tramadol every 8 hours as needed for more severe pain. Apply ice to your back for 20 minutes at a time.  You can also apply heat if this provides more relief.   You can apply the voltaren gel as directed to your areas of pain. You can take the Flexeril/cyclobenzaprine as previously prescribed as needed for muscle spasm.  Be aware this medication can make you drowsy; do not take while driving or drinking alcohol.   Follow-up with your primary care provider symptoms persist.   Return to ER if new numbness or tingling in your arms or legs, inability to urinate, inability to hold your bowels, or weakness in your extremities.

## 2019-02-18 NOTE — ED Provider Notes (Signed)
[facility] EMERGENCY DEPARTMENT Provider Note   CSN: [id number] Arrival date & time: 02/18/19  1425     History   Chief Complaint Chief Complaint  Patient presents with  . Back Pain    HPI Donna Woodward is a 63 y.o. female w PMHx DJD of the lumbar spine with chronic LBP, CAD, HTN, presenting to the ED with 1 month of gradually worsening lateral low back pain, worse on the right. No particular inciting injuries or activities. She had an e-visit with her PCP on the 12th of this month.  She was prescribed Flexeril for symptoms.  She states she also took an old tramadol this morning.  She feels like it is not providing her any significant relief.  Her pain is worse with movement and walking. Sometimes she feels like the pain radiates to her b/l lower abdomen at her ASIS.  She denies any numbness or weakness in her extremities, bowel or bladder incontinence, saddle paresthesias, fever, history of cancer or IV drug use.  She denies any urinary symptoms or other abdominal complaints. No hx of nephrolithiasis.     The history is provided by the patient and medical records.    Past Medical History:  Diagnosis Date  . Anxiety   . Back pain   . Coronary artery disease    CABGx3; Seen by Dr. Johnsie Cancel  . Degenerative joint disease (DJD) of lumbar spine 03/24/2016  . Depression   . GERD (gastroesophageal reflux disease)    if overweight  . Hyperlipidemia   . Hypertension     Patient Active Problem List   Diagnosis Date Noted  . Anxiety 11/06/2017  . S/P CABG x 3 06/17/2016  . Family history of early CAD 06/04/2016  . Chest pain 06/04/2016  . Abnormal nuclear stress test 06/04/2016  . Degenerative joint disease (DJD) of lumbar spine 03/24/2016  . Depression (emotion) 03/24/2016  . Obesity 08/14/2015  . Essential hypertension, benign 04/29/2015  . Hyperlipidemia 04/29/2015    Past Surgical History:  Procedure Laterality Date  . BACK SURGERY    . CARDIAC  CATHETERIZATION N/A 06/05/2016   Procedure: Left Heart Cath and Coronary Angiography;  Surgeon: Belva Crome, MD;  Location: [facility];  Service: Cardiovascular;  Laterality: N/A;  . CORONARY ARTERY BYPASS GRAFT N/A 06/17/2016   Procedure: CORONARY ARTERY BYPASS GRAFTING (CABG), ON PUMP, TIMES THREE, USING LEFT INTERNAL MAMMARY ARTERY, RIGHT GREATER SAPHENOUS VEIN HARVESTED ENDOSCOPICALLY;  Surgeon: Ivin Poot, MD;  Location: [facility];  Service: Open Heart Surgery;  Laterality: N/A;  . MULTIPLE TOOTH EXTRACTIONS    . [place]   lamnectomy L5S1  . TEE WITHOUT CARDIOVERSION N/A 06/17/2016   Procedure: TRANSESOPHAGEAL ECHOCARDIOGRAM (TEE);  Surgeon: Ivin Poot, MD;  Location: [facility];  Service: Open Heart Surgery;  Laterality: N/A;     OB History   No obstetric history on file.      Home Medications    Prior to Admission medications   Medication Sig Start Date End Date Taking? Authorizing Provider  aspirin EC 81 MG tablet Take 1 tablet (81 mg total) by mouth daily. Patient not taking: Reported on 01/19/2019 11/06/17   Caren Macadam, MD  Calcium Carb-Cholecalciferol (HM CALCIUM-VITAMIN D) 600-800 MG-UNIT TABS Take by mouth daily.    [provider]  cyclobenzaprine (FLEXERIL) 5 MG tablet Take one to two po qhs prn muscle spasm 02/09/19   Eulas Post, MD  diclofenac sodium (VOLTAREN) 1 %  GEL Apply 2 g topically 4 (four) times daily. 02/18/19   Jorje Vanatta, [place] N, PA-C  lisinopril-hydrochlorothiazide (ZESTORETIC) 20-25 MG tablet TAKE 1 TABLET EVERY DAY Patient not taking: Reported on 01/19/2019 01/17/19   Billie Ruddy, MD  metoprolol succinate (TOPROL-XL) 100 MG 24 hr tablet TAKE 1 TABLET (100 MG TOTAL) DAILY. TAKE WITH OR IMMEDIATELY FOLLOWING A MEAL. Patient not taking: Reported on 01/19/2019 10/19/18   Josue Hector, MD  omega-3 fish oil (MAXEPA) 1000 MG CAPS capsule Take 1 capsule (1,000 mg total) by mouth 2 (two) times daily. Reported on 11/14/2015  Patient not taking: Reported on 01/19/2019 03/24/16   Raylene Everts, MD  potassium chloride SA (K-DUR) 20 MEQ tablet Take 2 pills daily for 2 days.  Then take 1 pill daily. 01/20/19   Billie Ruddy, MD  simvastatin (ZOCOR) 40 MG tablet Take 1 tablet (40 mg total) by mouth daily. Patient not taking: Reported on 01/19/2019 11/05/18   Billie Ruddy, MD  traMADol (ULTRAM) 50 MG tablet Take 1 tablet (50 mg total) by mouth every 8 (eight) hours as needed for severe pain. 02/18/19   Taquisha Phung, [place] N, PA-C    Family History Family History  Problem Relation Age of Onset  . Hypertension Mother   . Stroke Father   . Heart disease Father   . Dementia Father   . Hypertension Sister   . CAD Sister 57       cabg  . Hypertension Sister   . Cancer Sister 98       rectal  . Hypertension Sister     Social History Social History   Tobacco Use  . Smoking status: Former Smoker    Quit date: 06/30/2006    Years since quitting: 12.6  . Smokeless tobacco: Never Used  Substance Use Topics  . Alcohol use: Yes    Comment: rare  . Drug use: Yes    Types: Marijuana     Allergies   Patient has no known allergies.   Review of Systems Review of Systems  Gastrointestinal: Negative for nausea and vomiting.  Genitourinary: Negative for dysuria, frequency and hematuria.  Musculoskeletal: Positive for back pain.  All other systems reviewed and are negative.    Physical Exam Updated Vital Signs BP (!) 144/81 (BP Location: Right Arm)   Pulse 84   Temp 98.7 F (37.1 C) (Oral)   Resp 14   Ht 5\' 4"  (1.626 m)   Wt 77.1 kg   SpO2 97%   BMI 29.18 kg/m   Physical Exam Vitals signs and nursing note reviewed.  Constitutional:      Appearance: She is well-developed.     Comments: Pt appears uncomfortable.  HENT:     Head: Normocephalic and atraumatic.  Eyes:     Conjunctiva/sclera: Conjunctivae normal.  Cardiovascular:     Rate and Rhythm: Normal rate and regular rhythm.  Pulmonary:      Effort: Pulmonary effort is normal. No respiratory distress.     Breath sounds: Normal breath sounds.  Abdominal:     General: Bowel sounds are normal.     Palpations: Abdomen is soft.     Comments: Palpation of the abdomen reproduces back pain, though pt denies tenderness to her abdomen.   Musculoskeletal:       Back:     Comments: TTP to left T/L paraspinal musculature as well as lower sacral region. No other midline spinal tenderness, no bony step-offs. Neg straight leg raise.  Skin:  General: Skin is warm.  Neurological:     Mental Status: She is alert.     Comments: Normal tone.  5/5 strength in BUE and BLE including strong and equal grip strength and dorsiflexion/plantar flexion Sensory: Pinprick and light touch normal in BLE extremities.  CV: distal pulses palpable throughout    Psychiatric:        Behavior: Behavior normal.      ED Treatments / Results  Labs (all labs ordered are listed, but only abnormal results are displayed) Labs Reviewed  URINALYSIS, ROUTINE W REFLEX MICROSCOPIC - Abnormal; Notable for the following components:      Result Value   Hgb urine dipstick SMALL (*)    All other components within normal limits    EKG None  Radiology No results found. Procedures Procedures (including critical care time)  Medications Ordered in ED Medications  diazepam (VALIUM) injection 5 mg (5 mg Intramuscular Given 02/18/19 1531)  HYDROcodone-acetaminophen (NORCO/VICODIN) 5-325 MG per tablet 1 tablet (1 tablet Oral Given 02/18/19 1531)  ketorolac (TORADOL) 30 MG/ML injection 30 mg (30 mg Intramuscular Given 02/18/19 1532)     Initial Impression / Assessment and Plan / ED Course  I have reviewed the triage vital signs and the nursing notes.  Pertinent labs & imaging results that were available during my care of the patient were reviewed by me and considered in my medical decision making (see chart for details).  Clinical Course as of Feb 17 1630  Fri Feb 18, 2019  1617 Patient reevaluated, reports improvement in symptoms.  Will discharge with some symptomatic management instructions to follow-up closely with her PCP.   [JR]    Clinical Course User Index [JR] Yobani Schertzer, [place] N, PA-C       Patient with acute on chronic back pain.  No neurological deficits and normal neuro exam.  Patient can walk but states is painful.  No loss of bowel or bladder control.  No concern for cauda equina.  No fever, night sweats, weight loss, h/o cancer, IVDU. U/A neg.  Pt treated in the ED with improvement. RICE protocol and pain medicine indicated and discussed with patient.   Discussed results, findings, treatment and follow up. Patient advised of return precautions. Patient verbalized understanding and agreed with plan.   Final Clinical Impressions(s) / ED Diagnoses   Final diagnoses:  Acute exacerbation of chronic low back pain    ED Discharge Orders         Ordered    traMADol (ULTRAM) 50 MG tablet  Every 8 hours PRN     02/18/19 1629    diclofenac sodium (VOLTAREN) 1 % GEL  4 times daily     02/18/19 1629           Jaiveon Suppes, [place] N, PA-C 02/18/19 1631    Virgel Manifold, MD 02/18/19 1657

## 2019-02-18 NOTE — ED Notes (Signed)
Patient verbalizes understanding of discharge instructions. Opportunity for questioning and answers were provided. Armband removed by staff, pt discharged from ED ambulatory.   

## 2019-02-18 NOTE — ED Triage Notes (Signed)
Pt states she has an increase in her chronic back pain. Progressively getting worse over the past month. States she has taken medication without relief. Pt is alert and oriented x4 at time of triage.

## 2019-02-19 ENCOUNTER — Encounter: Payer: Self-pay | Admitting: Family Medicine

## 2019-02-21 ENCOUNTER — Other Ambulatory Visit (INDEPENDENT_AMBULATORY_CARE_PROVIDER_SITE_OTHER): Payer: Medicare HMO

## 2019-02-21 ENCOUNTER — Other Ambulatory Visit: Payer: Self-pay

## 2019-02-21 DIAGNOSIS — E876 Hypokalemia: Secondary | ICD-10-CM | POA: Diagnosis not present

## 2019-02-21 LAB — POTASSIUM: Potassium: 3.8 mEq/L (ref 3.5–5.1)

## 2019-02-28 ENCOUNTER — Other Ambulatory Visit: Payer: Self-pay

## 2019-02-28 ENCOUNTER — Telehealth (INDEPENDENT_AMBULATORY_CARE_PROVIDER_SITE_OTHER): Payer: Medicare HMO | Admitting: Family Medicine

## 2019-02-28 DIAGNOSIS — M545 Low back pain: Secondary | ICD-10-CM

## 2019-02-28 DIAGNOSIS — J069 Acute upper respiratory infection, unspecified: Secondary | ICD-10-CM | POA: Diagnosis not present

## 2019-02-28 DIAGNOSIS — E876 Hypokalemia: Secondary | ICD-10-CM | POA: Diagnosis not present

## 2019-02-28 DIAGNOSIS — G8929 Other chronic pain: Secondary | ICD-10-CM | POA: Diagnosis not present

## 2019-02-28 DIAGNOSIS — B9789 Other viral agents as the cause of diseases classified elsewhere: Secondary | ICD-10-CM

## 2019-02-28 NOTE — Telephone Encounter (Signed)
LVM for pt to call the office and schedule a virtual appointment with Dr Volanda Napoleon

## 2019-02-28 NOTE — Progress Notes (Signed)
Virtual Visit via Video Note  I connected with Donna Woodward on 02/28/19 at  2:00 PM EDT by a video enabled telemedicine application 2/2 [id number] pandemic and verified that I am speaking with the correct person using two identifiers.  Location patient: home Location provider:work or home office Persons participating in the virtual visit: patient, provider  I discussed the limitations of evaluation and management by telemedicine and the availability of in person appointments. The patient expressed understanding and agreed to proceed.   HPI: Pt still having back pain. Seen 8/12 in office and 8/21 in ED for same.  States Tramadol made her feel like "walking on the moon", can deal with the flexeril.  Feels like there is pain around her spine that throbs.  Hips also hurt.  Ice helps.  Pt feels like she is having pain "around the bone" going down her leg.  Sitting and standing hurt.  Pt tosses and turns when trying to lay flat.  Pt had imaging in 2014, went to PT at that time.  Pt feeling depressed 2/2 the pain.  Pt feels like she is getting a "cold".  Taking tylenol, zycam, cough suppressant. Coughing.  Social hx: Pt had done a lot of physical work in her lifetime. 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   ROS: See pertinent positives and negatives per HPI.  Past Medical History:  Diagnosis Date  . Anxiety   . Back pain   . Coronary artery disease    CABGx3; Seen by Dr. Johnsie Cancel  . Degenerative joint disease (DJD) of lumbar spine 03/24/2016  . Depression   . GERD (gastroesophageal reflux disease)    if overweight  . Hyperlipidemia   . Hypertension     Past Surgical History:  Procedure Laterality Date  . BACK SURGERY    . CARDIAC CATHETERIZATION N/A 06/05/2016   Procedure: Left Heart Cath and Coronary Angiography;  Surgeon: Belva Crome, MD;  Location: [facility];  Service: Cardiovascular;  Laterality: N/A;  . CORONARY ARTERY BYPASS GRAFT N/A 06/17/2016   Procedure: CORONARY ARTERY  BYPASS GRAFTING (CABG), ON PUMP, TIMES THREE, USING LEFT INTERNAL MAMMARY ARTERY, RIGHT GREATER SAPHENOUS VEIN HARVESTED ENDOSCOPICALLY;  Surgeon: Ivin Poot, MD;  Location: [facility];  Service: Open Heart Surgery;  Laterality: N/A;  . MULTIPLE TOOTH EXTRACTIONS    . [place]   lamnectomy L5S1  . TEE WITHOUT CARDIOVERSION N/A 06/17/2016   Procedure: TRANSESOPHAGEAL ECHOCARDIOGRAM (TEE);  Surgeon: Ivin Poot, MD;  Location: [facility];  Service: Open Heart Surgery;  Laterality: N/A;    Family History  Problem Relation Age of Onset  . Hypertension Mother   . Stroke Father   . Heart disease Father   . Dementia Father   . Hypertension Sister   . CAD Sister 65       cabg  . Hypertension Sister   . Cancer Sister 72       rectal  . Hypertension Sister      Current Outpatient Medications:  .  aspirin EC 81 MG tablet, Take 1 tablet (81 mg total) by mouth daily. (Patient not taking: Reported on 01/19/2019), Disp: , Rfl:  .  Calcium Carb-Cholecalciferol (HM CALCIUM-VITAMIN D) 600-800 MG-UNIT TABS, Take by mouth daily., Disp: , Rfl:  .  cyclobenzaprine (FLEXERIL) 5 MG tablet, Take one to two po qhs prn muscle spasm, Disp: 30 tablet, Rfl: 1 .  diclofenac sodium (VOLTAREN) 1 % GEL, Apply 2 g topically 4 (four) times daily., Disp:  100 g, Rfl: 0 .  lisinopril-hydrochlorothiazide (ZESTORETIC) 20-25 MG tablet, TAKE 1 TABLET EVERY DAY (Patient not taking: Reported on 01/19/2019), Disp: 90 tablet, Rfl: 0 .  metoprolol succinate (TOPROL-XL) 100 MG 24 hr tablet, TAKE 1 TABLET (100 MG TOTAL) DAILY. TAKE WITH OR IMMEDIATELY FOLLOWING A MEAL. (Patient not taking: Reported on 01/19/2019), Disp: 90 tablet, Rfl: 3 .  omega-3 fish oil (MAXEPA) 1000 MG CAPS capsule, Take 1 capsule (1,000 mg total) by mouth 2 (two) times daily. Reported on 11/14/2015 (Patient not taking: Reported on 01/19/2019), Disp: 180 each, Rfl: 1 .  potassium chloride SA (K-DUR) 20 MEQ tablet, Take 2 pills daily for 2 days.  Then take 1  pill daily., Disp: 30 tablet, Rfl: 0 .  simvastatin (ZOCOR) 40 MG tablet, Take 1 tablet (40 mg total) by mouth daily. (Patient not taking: Reported on 01/19/2019), Disp: 90 tablet, Rfl: 1 .  traMADol (ULTRAM) 50 MG tablet, Take 1 tablet (50 mg total) by mouth every 8 (eight) hours as needed for severe pain., Disp: 10 tablet, Rfl: 0  EXAM:  VITALS per patient if applicable:  GENERAL: alert, oriented, appears well and in no acute distress  HEENT: atraumatic, conjunctiva clear, no obvious abnormalities on inspection of external nose and ears  NECK: normal movements of the head and neck  LUNGS: on inspection no signs of respiratory distress, breathing rate appears normal, no obvious gross SOB, gasping or wheezing  CV: no obvious cyanosis  MS: moves all visible extremities without noticeable abnormality  PSYCH/NEURO: pleasant and cooperative, no obvious depression or anxiety, speech and thought processing grossly intact  ASSESSMENT AND PLAN:  Discussed the following assessment and plan:  Chronic bilateral low back pain without sciatica  -discussed long-term treatment options.  Offered PT.  Pt declines.  Pt open to idea of seeing a Chiropractor. -continue supportive care -continue flexeril.  Pt advised she has another refill.  Pt will call pharmacy. - Plan: Ambulatory referral to Chiropractic  Viral URI with cough -continue supportive care -given precautions  Hypokalemia -completed course of Kdur -will recheck K+ in a few wks.  F/u prn   I discussed the assessment and treatment plan with the patient. The patient was provided an opportunity to ask questions and all were answered. The patient agreed with the plan and demonstrated an understanding of the instructions.   The patient was advised to call back or seek an in-person evaluation if the symptoms worsen or if the condition fails to improve as anticipated.   Billie Ruddy, MD

## 2019-03-11 NOTE — Telephone Encounter (Signed)
Pt had a virtual visit on 02/28/2019 with Dr Volanda Napoleon

## 2019-05-08 ENCOUNTER — Other Ambulatory Visit: Payer: Self-pay | Admitting: Family Medicine

## 2019-05-09 MED ORDER — LISINOPRIL-HYDROCHLOROTHIAZIDE 20-25 MG PO TABS: 1.0000 | ORAL_TABLET | Freq: Every day | ORAL | 0 refills | Status: DC

## 2019-05-13 ENCOUNTER — Other Ambulatory Visit: Payer: Self-pay | Admitting: Family Medicine

## 2019-05-16 ENCOUNTER — Other Ambulatory Visit: Payer: Self-pay | Admitting: Cardiovascular Disease

## 2019-05-16 MED ORDER — METOPROLOL SUCCINATE ER 100 MG PO TB24: ORAL_TABLET | ORAL | 0 refills | Status: DC

## 2019-05-16 NOTE — Telephone Encounter (Signed)
Pt's medication was sent to pt's pharmacy as requested. Confirmation received.  °

## 2019-07-19 NOTE — Progress Notes (Signed)
Cardiology Office Note    Date:  07/27/2019   ID:  Donna Woodward [birth year], MRN [id number]  PCP:  Billie Ruddy, MD  Cardiologist:  Dr. Johnsie Cancel  Chief Complaint: Hospital follow up s/p CABG  History of Present Illness:   Donna Woodward is a 64 y.o. female with HTN, HLD, previous smoker and  CABG presents for follow up. Seen for SSCP 05/07/16   F/u Cath 06/05/16 showed 3V disease s/p CABG x 3 (LIMA to LAD; SVG to posterior descending; SVG to diagonal). Post op Had some volume overload and ventricular ectopy Amiodarone d/c and Toprol increased   Holter monitor reviewed:  NSR rates 51-119 PVC;s 7/hr some short bursts NSVT 5 beats and PSVT same 5-6 beats   Has done well on medical RX   Depressed - from [place] and wants to return but can't afford it Some neuropathic pain over left chest area from CABG Has chronic back pain Rx with flexeril referred to Chiropractic Rx   Dr Grier Mitts has done her lab work ? LDL 130 in July discussed compliance And possible need to change statin to discuss with primary   Past Medical History:  Diagnosis Date  . Anxiety   . Back pain   . Coronary artery disease    CABGx3; Seen by Dr. Johnsie Cancel  . Degenerative joint disease (DJD) of lumbar spine 03/24/2016  . Depression   . GERD (gastroesophageal reflux disease)    if overweight  . Hyperlipidemia   . Hypertension     Past Surgical History:  Procedure Laterality Date  . BACK SURGERY    . CARDIAC CATHETERIZATION N/A 06/05/2016   Procedure: Left Heart Cath and Coronary Angiography;  Surgeon: Belva Crome, MD;  Location: [facility];  Service: Cardiovascular;  Laterality: N/A;  . CORONARY ARTERY BYPASS GRAFT N/A 06/17/2016   Procedure: CORONARY ARTERY BYPASS GRAFTING (CABG), ON PUMP, TIMES THREE, USING LEFT INTERNAL MAMMARY ARTERY, RIGHT GREATER SAPHENOUS VEIN HARVESTED ENDOSCOPICALLY;  Surgeon: Ivin Poot, MD;  Location: [facility];  Service: Open Heart Surgery;   Laterality: N/A;  . MULTIPLE TOOTH EXTRACTIONS    . [place]   lamnectomy L5S1  . TEE WITHOUT CARDIOVERSION N/A 06/17/2016   Procedure: TRANSESOPHAGEAL ECHOCARDIOGRAM (TEE);  Surgeon: Ivin Poot, MD;  Location: [facility];  Service: Open Heart Surgery;  Laterality: N/A;    Current Medications: Prior to Admission medications   Medication Sig Start Date End Date Taking? Authorizing Provider  amiodarone (PACERONE) 200 MG tablet Take 1 tablet (200 mg total) by mouth 2 (two) times daily. For one week;then take Amiodarone 200 mg daily by mouth thereafter 06/24/16   Nani Skillern, PA-C  aspirin EC 325 MG EC tablet Take 1 tablet (325 mg total) by mouth daily. 06/24/16   Donielle Liston Alba, PA-C  guaiFENesin (MUCINEX) 600 MG 12 hr tablet Take 1 tablet (600 mg total) by mouth 2 (two) times daily as needed for cough. 06/24/16   Donielle Liston Alba, PA-C  lisinopril-hydrochlorothiazide (PRINZIDE,ZESTORETIC) 20-25 MG tablet Take 1 tablet by mouth daily. Patient taking differently: Take 1 tablet by mouth daily. Taken in the morning 03/24/16   Raylene Everts, MD  metoprolol succinate (TOPROL-XL) 50 MG 24 hr tablet Take 1 tablet (50 mg total) by mouth daily. Take with or immediately following a meal. Patient taking differently: Take 50 mg by mouth. Take with or immediately following a meal; in the morning 03/24/16   Raylene Everts,  MD  omega-3 fish oil (MAXEPA) 1000 MG CAPS capsule Take 1 capsule (1,000 mg total) by mouth 2 (two) times daily. Reported on 11/14/2015 03/24/16   Raylene Everts, MD  potassium chloride SA (K-DUR,KLOR-CON) 20 MEQ tablet Take 1 tablet (20 mEq total) by mouth daily. Patient taking differently: Take 20 mEq by mouth daily. Taken at night 06/04/16   Imogene Burn, PA-C  simvastatin (ZOCOR) 20 MG tablet Take 1 tablet (20 mg total) by mouth at bedtime. 03/24/16   Raylene Everts, MD  traMADol (ULTRAM) 50 MG tablet Take 1 tablet (50 mg total) by mouth every 4  (four) hours as needed for moderate pain. 06/24/16   Donielle Liston Alba, PA-C    Allergies:   Patient has no known allergies.   Social History   Socioeconomic History  . Marital status: Single    Spouse name: Not on file  . Number of children: Not on file  . Years of education: 50  . Highest education level: Not on file  Occupational History  . Occupation: disabled    Comment: warehouse  Tobacco Use  . Smoking status: Former Smoker    Quit date: 06/30/2006    Years since quitting: 13.0  . Smokeless tobacco: Never Used  Substance and Sexual Activity  . Alcohol use: Yes    Comment: rare  . Drug use: Yes    Types: Marijuana  . Sexual activity: Not Currently  Other Topics Concern  . 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   Grew up in [place] and lived here for since 2014.    Disabled with back pain.    Social Determinants of Health   Financial Resource Strain:   . Difficulty of Paying Living Expenses: Not on file  Food Insecurity:   . Worried About Charity fundraiser in the Last Year: Not on file  . Ran Out of Food in the Last Year: Not on file  Transportation Needs:   . Lack of Transportation (Medical): Not on file  . Lack of Transportation (Non-Medical): Not on file  Physical Activity:   . Days of Exercise per Week: Not on file  . Minutes of Exercise per Session: Not on file  Stress:   . Feeling of Stress : Not on file  Social Connections:   . Frequency of Communication with Friends and Family: Not on file  . Frequency of Social Gatherings with Friends and Family: Not on file  . Attends Religious Services: Not on file  . Active Member of Clubs or Organizations: Not on file  . Attends Archivist Meetings: Not on file  . Marital Status: Not on file     Family History:  The patient's family history includes CAD (age of onset: 74) in her sister; Cancer (age of onset: 29) in her sister; Dementia in  her father; Heart disease in her father; Hypertension in her mother, sister, sister, and sister; Stroke in her father.   ROS:   Please see the history of present illness.    ROS All other systems reviewed and are negative.   PHYSICAL EXAM:   VS:  BP (!) 152/90   Pulse (!) 55   Ht 5\' 4"  (1.626 m)   Wt 176 lb 6.4 oz (80 kg)   SpO2 95%   BMI 30.28 kg/m    Affect appropriate Healthy:  appears stated age HEENT: normal Neck supple with no adenopathy  JVP normal no bruits no thyromegaly Lungs clear with no wheezing and good diaphragmatic motion Heart:  S1/S2 no murmur, no rub, gallop or click PMI normal post sternotomy  Abdomen: benighn, BS positve, no tenderness, no AAA no bruit.  No HSM or HJR Distal pulses intact with no bruits No edema Neuro non-focal Skin warm and dry No muscular weakness Venectomy scars RU thigh and calf and L calf healed well Multiple skin tatoos    Wt Readings from Last 3 Encounters:  07/27/19 176 lb 6.4 oz (80 kg)  02/18/19 170 lb (77.1 kg)  01/19/19 168 lb 3.2 oz (76.3 kg)      Studies/Labs Reviewed:   07/27/19 ECG:  SR rate 55 normal   Recent Labs: 01/19/2019: BUN 10; Creatinine, Ser 0.66; Hemoglobin 14.2; Platelets 224.0; Sodium 143 02/21/2019: Potassium 3.8   Lipid Panel    Component Value Date/Time   CHOL 142 01/19/2019 1214   TRIG 130.0 01/19/2019 1214   HDL 29.80 (L) 01/19/2019 1214   CHOLHDL 5 01/19/2019 1214   VLDL 26.0 01/19/2019 1214   LDLCALC 86 01/19/2019 1214   LDLCALC 81 01/04/2018 1038    Additional studies/ records that were reviewed today include:   TEE 06/17/16 Result status: Final result   Left ventricle: Normal cavity size and wall thickness. LV systolic function is low normal with an EF of 50-55%. No thrombus present. No mass present.  Septum: No Patent Foramen Ovale present. The interatrial septum bows to the left consistent with high right atrial pressure.  Left atrium: Patent foramen ovale not  present.  Aortic valve: The valve is trileaflet. Mild valve calcification present. No stenosis. No stenosis by continuous wave doppler Trace regurgitation.  Mitral valve: Mild leaflet thickening is present. Trace regurgitation.  Right ventricle: Normal cavity size and ejection fraction. There is mild hypertrophy.  Tricuspid valve: Mild regurgitation. The tricuspid valve regurgitation jet is central.     Cardiac Catheterization:  06/05/16 Left Heart Cath and Coronary Angiography  Conclusion    Severe calcific, diffuse three-vessel coronary disease.  Codominant LAD/diagonal system with severe diffuse disease in each vessel with graftable distal targets.  Eccentric 30-60% ostial left main depending upon review.  99% proximal to mid RCA. Total occlusion at the ostium of a large PDA that fills left-to-right collaterals.  Moderate mid circumflex disease but without graftable targets beyond the stenosis.  Normal left ventricular systolic function with normal hemodynamics   RECOMMENDATIONS:   Continue current medical regimen.  Expedient evaluation by [facility] for multivessel coronary bypass grafting.       ASSESSMENT & PLAN:    1. CAD s/p CABG x 3 06/05/16  - No anginal pain. Continue ASA and Statin and beta blocker   2. HTN - Well controlled.  Continue current medications and low sodium Dash type diet.    3. HLD - having lab work with primary April discussed target LDL 70 less ? Change statin    4. PVC;s  - not that frequent and asymptomatic on Toprol   F/U with me in  A year   Jenkins Rouge Patient ID: Donna Woodward, female   DOB: 1955-12-09, 64 y.o.   MRN: [id number]

## 2019-07-27 ENCOUNTER — Ambulatory Visit: Payer: Medicare HMO | Admitting: Cardiovascular Disease

## 2019-07-27 ENCOUNTER — Encounter: Payer: Self-pay | Admitting: Cardiovascular Disease

## 2019-07-27 ENCOUNTER — Other Ambulatory Visit: Payer: Self-pay

## 2019-07-27 VITALS — BP 152/90 | HR 55 | Ht 64.0 in | Wt 176.4 lb

## 2019-07-27 DIAGNOSIS — I25810 Atherosclerosis of coronary artery bypass graft(s) without angina pectoris: Secondary | ICD-10-CM

## 2019-07-27 NOTE — Patient Instructions (Addendum)

## 2019-08-16 ENCOUNTER — Telehealth: Payer: Self-pay | Admitting: Family Medicine

## 2019-08-16 NOTE — Telephone Encounter (Signed)
Left message for patient to call back and schedule Medicare Annual Wellness Visit (AWV) either virtually,audio only or in person (whichever the patient prefers--45 MINUTES).  No hx; please schedule at anytime with [facility]Nurse Health Advisor at [facility] [place].

## 2019-08-23 ENCOUNTER — Other Ambulatory Visit: Payer: Self-pay

## 2019-08-23 ENCOUNTER — Ambulatory Visit: Payer: Medicare HMO

## 2019-08-23 ENCOUNTER — Telehealth (INDEPENDENT_AMBULATORY_CARE_PROVIDER_SITE_OTHER): Payer: Medicare HMO | Admitting: Family Medicine

## 2019-08-23 ENCOUNTER — Encounter: Payer: Self-pay | Admitting: Family Medicine

## 2019-08-23 DIAGNOSIS — Z0000 Encounter for general adult medical examination without abnormal findings: Secondary | ICD-10-CM

## 2019-08-23 DIAGNOSIS — E785 Hyperlipidemia, unspecified: Secondary | ICD-10-CM

## 2019-08-23 DIAGNOSIS — I10 Essential (primary) hypertension: Secondary | ICD-10-CM | POA: Diagnosis not present

## 2019-08-23 DIAGNOSIS — Z1239 Encounter for other screening for malignant neoplasm of breast: Secondary | ICD-10-CM

## 2019-08-23 DIAGNOSIS — Z6832 Body mass index (BMI) 32.0-32.9, adult: Secondary | ICD-10-CM

## 2019-08-23 DIAGNOSIS — F339 Major depressive disorder, recurrent, unspecified: Secondary | ICD-10-CM | POA: Diagnosis not present

## 2019-08-23 DIAGNOSIS — E669 Obesity, unspecified: Secondary | ICD-10-CM

## 2019-08-23 NOTE — Progress Notes (Signed)
Medicare Annual Preventive Care Visit  (initial annual wellness or annual wellness exam)  Virtual Visit via Video Note  I connected with [name] by a video enabled telemedicine application and verified that I am speaking with the correct person using two identifiers.  Location patient: home Location provider:work or home office Persons participating in the virtual visit: patient, provider  Concerns and/or follow up today:  This is a 64 yo patient of Dr. Volanda Napoleon with a PMH CAD, HTN, HLD and obesity seen for annual wellness visit.  She saw her PCP earlier this year for her CPE. Due for flu - declined flu shot, reports does not usually get it and not leaving her house for hardly anything due to the pandemic; she did have questions about the COVID19 vaccines. Pap and mammo she declined initially, after discussion she agreed to get the mammogram and the pap smear.  Walks with dog several times per day. Reports changed her diet several years ago - avoids fast food as much as she can. Does home cooked foods and tries to eat vegetables for half the plate as much as possible. Avoiding fried foods. Trying and air fryer. Reports BP is usually 130/80. P 57.   See HM section in Epic for other details of completed HM. See scanned documentation under Media Tab for further documentation HPI, health risk assessment. See Media Tab and Care Teams sections in Epic for other providers.  I do not prescribe narcotic pain medications for this patient.  ROS: negative for report of fevers, unintentional weight loss, vision changes (she is seeing opthomologist for appointment for being near sighted), vision loss, hearing loss or change, chest pain, sob, hemoptysis, melena, hematochezia, hematuria, genital discharge or lesions, falls, bleeding or bruising, loc, thoughts of suicide or self harm, memory loss  1.) Patient-completed health risk assessment  - completed and reviewed, see scanned documentation  2.) Review of  Medical History: -PMH, PSH, Family History and current specialty and care providers reviewed and updated and listed below  - see scanned in document in chart and below Patient Care Team: Billie Ruddy, MD as PCP - General (Family Medicine) Josue Hector, MD as PCP - Cardiology (Cardiology) [facility]  Past Medical History:  Diagnosis Date  . Anxiety   . Back pain   . Coronary artery disease    CABGx3; Seen by Dr. Johnsie Cancel  . Degenerative joint disease (DJD) of lumbar spine 03/24/2016  . Depression   . GERD (gastroesophageal reflux disease)    if overweight  . Hyperlipidemia   . Hypertension     Past Surgical History:  Procedure Laterality Date  . BACK SURGERY    . CARDIAC CATHETERIZATION N/A 06/05/2016   Procedure: Left Heart Cath and Coronary Angiography;  Surgeon: Belva Crome, MD;  Location: [facility];  Service: Cardiovascular;  Laterality: N/A;  . CORONARY ARTERY BYPASS GRAFT N/A 06/17/2016   Procedure: CORONARY ARTERY BYPASS GRAFTING (CABG), ON PUMP, TIMES THREE, USING LEFT INTERNAL MAMMARY ARTERY, RIGHT GREATER SAPHENOUS VEIN HARVESTED ENDOSCOPICALLY;  Surgeon: Ivin Poot, MD;  Location: [facility];  Service: Open Heart Surgery;  Laterality: N/A;  . MULTIPLE TOOTH EXTRACTIONS    . [place]   lamnectomy L5S1  . TEE WITHOUT CARDIOVERSION N/A 06/17/2016   Procedure: TRANSESOPHAGEAL ECHOCARDIOGRAM (TEE);  Surgeon: Ivin Poot, MD;  Location: [facility];  Service: Open Heart Surgery;  Laterality: N/A;    Social History   Socioeconomic History  . Marital  status: Single    Spouse name: Not on file  . Number of children: Not on file  . Years of education: 52  . Highest education level: Not on file  Occupational History  . Occupation: disabled    Comment: warehouse  Tobacco Use  . Smoking status: Former Smoker    Quit date: 06/30/2006    Years since quitting: 13.1  . Smokeless tobacco: Never Used  Substance and Sexual Activity  .  Alcohol use: Yes    Comment: rare  . Drug use: Yes    Types: Marijuana  . Sexual activity: Not Currently  Other Topics Concern  . 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   Grew up in [place] and lived here for since 2014.    Disabled with back pain.    Social Determinants of Health   Financial Resource Strain:   . Difficulty of Paying Living Expenses: Not on file  Food Insecurity:   . Worried About Charity fundraiser in the Last Year: Not on file  . Ran Out of Food in the Last Year: Not on file  Transportation Needs:   . Lack of Transportation (Medical): Not on file  . Lack of Transportation (Non-Medical): Not on file  Physical Activity:   . Days of Exercise per Week: Not on file  . Minutes of Exercise per Session: Not on file  Stress:   . Feeling of Stress : Not on file  Social Connections:   . Frequency of Communication with Friends and Family: Not on file  . Frequency of Social Gatherings with Friends and Family: Not on file  . Attends Religious Services: Not on file  . Active Member of Clubs or Organizations: Not on file  . Attends Archivist Meetings: Not on file  . Marital Status: Not on file  Intimate Partner Violence:   . Fear of Current or Ex-Partner: Not on file  . Emotionally Abused: Not on file  . Physically Abused: Not on file  . Sexually Abused: Not on file    Family History  Problem Relation Age of Onset  . Hypertension Mother   . Stroke Father   . Heart disease Father   . Dementia Father   . Hypertension Sister   . CAD Sister 8       cabg  . Hypertension Sister   . Cancer Sister 61       rectal  . Hypertension Sister     Current Outpatient Medications on File Prior to Visit  Medication Sig Dispense Refill  . aspirin EC 81 MG tablet Take 1 tablet (81 mg total) by mouth daily.    . Calcium Carb-Cholecalciferol (HM CALCIUM-VITAMIN D) 600-800 MG-UNIT TABS Take by mouth daily.     . cyclobenzaprine (FLEXERIL) 5 MG tablet Take one to two po qhs prn muscle spasm 30 tablet 1  . lisinopril-hydrochlorothiazide (ZESTORETIC) 20-25 MG tablet TAKE 1 TABLET EVERY DAY 90 tablet 0  . metoprolol succinate (TOPROL-XL) 100 MG 24 hr tablet TAKE 1 TABLET (100 MG TOTAL) DAILY. TAKE WITH OR IMMEDIATELY FOLLOWING A MEAL. 10 tablet 0  . omega-3 fish oil (MAXEPA) 1000 MG CAPS capsule Take 1 capsule (1,000 mg total) by mouth 2 (two) times daily. Reported on 11/14/2015 180 each 1  . simvastatin (ZOCOR) 40 MG tablet TAKE 1 TABLET EVERY DAY 90 tablet 1   No current facility-administered medications on file prior to visit.  3.) Review of functional ability and level of safety:  Any difficulty hearing?  See scanned documentation  History of falling?  See scanned documentation  Any trouble with IADLs - using a phone, using transportation, grocery shopping, preparing meals, doing housework, doing laundry, taking medications and managing money?  See scanned documentation  Advance Directives?  Discussed briefly and offered more resources and detailed discussion with our trained staff. Patient will consider.   See summary of recommendations in Patient Instructions below.  4.) Physical Exam There were no vitals filed for this visit. Estimated body mass index is 30.28 kg/m as calculated from the following:   Height as of 07/27/19: 5\' 4"  (1.626 m).   Weight as of 07/27/19: 176 lb 6.4 oz (80 kg).  EKG (optional): deferred  VITALS per patient if applicable:  GENERAL: alert, oriented, appears well and in no acute distress; visual acuity grossly intact, full vision exam deferred due to pandemic and/or virtual encounter  HEENT: atraumatic, conjunttiva clear, no obvious abnormalities on inspection of external nose and ears  NECK: normal movements of the head and neck  LUNGS: on inspection no signs of respiratory distress, breathing rate appears normal, no obvious gross SOB, gasping or  wheezing  CV: no obvious cyanosis  MS: moves all visible extremities without noticeable abnormality  PSYCH/NEURO: pleasant and cooperative, no obvious depression or anxiety, speech and thought processing grossly intact, Cognitive function grossly intact  Depression screen [facility] 2/9 08/23/2019 05/06/2018 11/11/2017 11/11/2017 11/06/2017  Decreased Interest 2 2 1 1 1   Down, Depressed, Hopeless 2 2 3 3 2   PHQ - 2 Score 4 4 4 4 3   Altered sleeping 3 3 2 2 2   Tired, decreased energy 1 2 1 1 1   Change in appetite 0 1 1 1  0  Feeling bad or failure about yourself  3 3 3 3 2   Trouble concentrating 2 2 2 2  0  Moving slowly or fidgety/restless 0 2 3 - 0  Suicidal thoughts 0 0 0 - 0  PHQ-9 Score 13 17 16 13 8   Difficult doing work/chores - - - - Somewhat difficult     See patient instructions for recommendations.  Education and counseling regarding the above review of health provided with a plan for the following: -see scanned patient completed form for further details -fall prevention strategies discussed  -healthy lifestyle discussed -importance and resources for completing advanced directives discussed -see patient instructions below for any other recommendations provided  4)The following written screening schedule of preventive measures were reviewed with assessment and plan made per below, orders and patient instructions:      Alcohol screening done     Obesity Screening and counseling done     STI screening (Hep C if born [birth year]) offered and per pt wishes     Tobacco Screening done       Pneumococcal (PPSV23 -one dose after 64, one before if risk factors), influenza yearly and hepatitis B vaccines (if high risk - end stage renal disease, IV drugs, homosexual men, live in home for mentally retarded, hemophilia receiving factors) ASSESSMENT/PLAN: done if applicable      Screening mammograph (yearly if >40) ASSESSMENT/PLAN: declined initially, but after discussion agreed to do and asked  that our office schedule - sent message to my assistant to order for her.      Screening Pap smear/pelvic exam (q2 years) ASSESSMENT/PLAN: declined initially, then after discussion agreed to schedule with PCP - I sent message to scheduling staff to schedule.  Colorectal cancer screening (FOBT yearly or flex sig q4y or colonoscopy q10y or barium enema q4y) ASSESSMENT/PLAN: utd or ordered      Diabetes outpatient self-management training services ASSESSMENT/PLAN: utd or done      Bone mass measurements(covered q2y if indicated - estrogen def, osteoporosis, hyperparathyroid, vertebral abnormalities, osteoporosis or steroids) ASSESSMENT/PLAN: utd or discussed and ordered per pt wishes      Screening for glaucoma(q1y if high risk - diabetes, FH, AA and > 50 or hispanic and > 65) ASSESSMENT/PLAN: utd or advised - she has optho appointment coming up in a few weeks per her report      Medical nutritional therapy for individuals with diabetes or renal disease ASSESSMENT/PLAN: see orders      Cardiovascular screening blood tests (lipids q5y) ASSESSMENT/PLAN: see orders and labs      Diabetes screening tests ASSESSMENT/PLAN: see orders and labs   7.) Summary:   Medicare annual wellness visit, subsequent  Essential hypertension, benign  Hyperlipidemia, unspecified hyperlipidemia type  Class 1 obesity with body mass index (BMI) of 32.0 to 32.9 in adult, unspecified obesity type, unspecified whether serious comorbidity present  Episode of recurrent major depressive disorder, unspecified depression episode severity ([place])  -risk factors and conditions per above assessment were discussed and treatment, recommendations and referrals were offered per documentation above and orders and patient instructions. -she initially declined pap and mammo, then after discussion agreed to do - sent message to Alexia Freestone to order mammogram and to schedulers to schedule for pap with follow up with PCP. She  prefers to do labs that day as well to avoid extra office visits. -she declined flu vaccine, education provided. She does plan to get COVID19 vaccine when avaliable to her.  -healthy diet and regular exercise encouraged. She is monitoring BP at home and sees Dr. Johnsie Cancel. Continue current medications and advised she see PCP for follow up/pap/BMP. She agrees to schedule. Message sent to schedulers to assist  Patient Instructions    Ms. Relaford , Thank you for taking time to come for your Medicare Wellness Visit. I appreciate your ongoing commitment to your health goals. Please review the following plan we discussed and let me know if I can assist you in the future.   These are the goals we discussed: Goals   -continue healthy diet and regular exercise -get COVID19 vaccine when available to you -consider flu vaccine  -get pap smear (schedule appointment with Dr. Volanda Napoleon) -do mammogram (I sent message to assistant to order this for you, please call our office if you have not heard about this appointment in the next 1 week) -please set up advanced directives   This is a list of the screening recommended for you and due dates:  Health Maintenance  Topic Date Due  . Pap Smear  08/23/2019*  . Flu Shot  09/28/2019*  . Mammogram  08/22/2020*  . Colon Cancer Screening  06/30/2020  . Tetanus Vaccine  11/07/2027  .  Hepatitis C: One time screening is recommended by Center for Disease Control  (CDC) for  adults born from [birth year] through 1965.   Completed  . HIV Screening  Completed  *Topic was postponed. The date shown is not the original due date.    Lucretia Kern, DO

## 2019-08-23 NOTE — Patient Instructions (Addendum)
  Donna Woodward , Thank you for taking time to come for your Medicare Wellness Visit. I appreciate your ongoing commitment to your health goals. Please review the following plan we discussed and let me know if I can assist you in the future.   These are the goals we discussed: Goals   -continue healthy diet and regular exercise -get COVID19 vaccine when available to you -consider flu vaccine  -get pap smear (schedule appointment with Dr. Volanda Napoleon) -do mammogram (I sent message to assistant to order this for you, please call our office if you have not heard about this appointment in the next 1 week) -please set up advanced directives   This is a list of the screening recommended for you and due dates:  Health Maintenance  Topic Date Due  . Pap Smear  08/23/2019*  . Flu Shot  09/28/2019*  . Mammogram  08/22/2020*  . Colon Cancer Screening  06/30/2020  . Tetanus Vaccine  11/07/2027  .  Hepatitis C: One time screening is recommended by Center for Disease Control  (CDC) for  adults born from [birth year] through 1965.   Completed  . HIV Screening  Completed  *Topic was postponed. The date shown is not the original due date.

## 2019-09-21 DIAGNOSIS — H524 Presbyopia: Secondary | ICD-10-CM | POA: Diagnosis not present

## 2019-09-21 DIAGNOSIS — H52209 Unspecified astigmatism, unspecified eye: Secondary | ICD-10-CM | POA: Diagnosis not present

## 2019-09-21 DIAGNOSIS — H5213 Myopia, bilateral: Secondary | ICD-10-CM | POA: Diagnosis not present

## 2019-09-28 ENCOUNTER — Other Ambulatory Visit: Payer: Self-pay | Admitting: Family Medicine

## 2019-10-05 ENCOUNTER — Encounter: Payer: Medicare HMO | Admitting: Family Medicine

## 2019-10-12 ENCOUNTER — Other Ambulatory Visit: Payer: Self-pay | Admitting: Family Medicine

## 2019-10-19 ENCOUNTER — Other Ambulatory Visit: Payer: Self-pay

## 2019-10-20 ENCOUNTER — Ambulatory Visit (INDEPENDENT_AMBULATORY_CARE_PROVIDER_SITE_OTHER): Payer: Medicare HMO | Admitting: Family Medicine

## 2019-10-20 ENCOUNTER — Encounter: Payer: Self-pay | Admitting: Family Medicine

## 2019-10-20 VITALS — BP 138/84 | HR 60 | Temp 97.9°F | Wt 176.6 lb

## 2019-10-20 DIAGNOSIS — E782 Mixed hyperlipidemia: Secondary | ICD-10-CM

## 2019-10-20 DIAGNOSIS — F419 Anxiety disorder, unspecified: Secondary | ICD-10-CM | POA: Diagnosis not present

## 2019-10-20 DIAGNOSIS — S29011A Strain of muscle and tendon of front wall of thorax, initial encounter: Secondary | ICD-10-CM

## 2019-10-20 DIAGNOSIS — L570 Actinic keratosis: Secondary | ICD-10-CM | POA: Diagnosis not present

## 2019-10-20 DIAGNOSIS — Z0000 Encounter for general adult medical examination without abnormal findings: Secondary | ICD-10-CM

## 2019-10-20 DIAGNOSIS — I10 Essential (primary) hypertension: Secondary | ICD-10-CM

## 2019-10-20 LAB — LIPID PANEL
Cholesterol: 137 mg/dL (ref 0–200)
HDL: 37.2 mg/dL — ABNORMAL LOW (ref >39.00)
LDL Cholesterol: 84 mg/dL (ref 0–99)
NonHDL: 99.55
Total CHOL/HDL Ratio: 4
Triglycerides: 77 mg/dL (ref 0.0–149.0)
VLDL: 15.4 mg/dL (ref 0.0–40.0)

## 2019-10-20 LAB — COMPREHENSIVE METABOLIC PANEL
ALT: 14 U/L (ref 0–35)
AST: 13 U/L (ref 0–37)
Albumin: 4.4 g/dL (ref 3.5–5.2)
Alkaline Phosphatase: 52 U/L (ref 39–117)
BUN: 14 mg/dL (ref 6–23)
CO2: 33 mEq/L — ABNORMAL HIGH (ref 19–32)
Calcium: 9.9 mg/dL (ref 8.4–10.5)
Chloride: 102 mEq/L (ref 96–112)
Creatinine, Ser: 0.73 mg/dL (ref 0.40–1.20)
GFR: 80.32 mL/min (ref >60.00)
Glucose, Bld: 107 mg/dL — ABNORMAL HIGH (ref 70–99)
Potassium: 3.3 mEq/L — ABNORMAL LOW (ref 3.5–5.1)
Sodium: 143 mEq/L (ref 135–145)
Total Bilirubin: 0.8 mg/dL (ref 0.2–1.2)
Total Protein: 6.7 g/dL (ref 6.0–8.3)

## 2019-10-20 LAB — CBC WITH DIFFERENTIAL/PLATELET
Basophils Absolute: 0.1 10*3/uL (ref 0.0–0.1)
Basophils Relative: 0.5 % (ref 0.0–3.0)
Eosinophils Absolute: 0.2 10*3/uL (ref 0.0–0.7)
Eosinophils Relative: 1.7 % (ref 0.0–5.0)
HCT: 44 % (ref 36.0–46.0)
Hemoglobin: 14.7 g/dL (ref 12.0–15.0)
Lymphocytes Relative: 16.4 % (ref 12.0–46.0)
Lymphs Abs: 1.7 10*3/uL (ref 0.7–4.0)
MCHC: 33.5 g/dL (ref 30.0–36.0)
MCV: 86.6 fl (ref 78.0–100.0)
Monocytes Absolute: 0.7 10*3/uL (ref 0.1–1.0)
Monocytes Relative: 6.5 % (ref 3.0–12.0)
Neutro Abs: 8 10*3/uL — ABNORMAL HIGH (ref 1.4–7.7)
Neutrophils Relative %: 74.9 % (ref 43.0–77.0)
Platelets: 190 10*3/uL (ref 150.0–400.0)
RBC: 5.08 Mil/uL (ref 3.87–5.11)
RDW: 15.6 % — ABNORMAL HIGH (ref 11.5–15.5)
WBC: 10.6 10*3/uL — ABNORMAL HIGH (ref 4.0–10.5)

## 2019-10-20 LAB — T4, FREE: Free T4: 0.84 ng/dL (ref 0.60–1.60)

## 2019-10-20 LAB — TSH: TSH: 1.65 u[IU]/mL (ref 0.35–4.50)

## 2019-10-20 LAB — HEMOGLOBIN A1C: Hgb A1c MFr Bld: 5.8 % (ref 4.6–6.5)

## 2019-10-20 NOTE — Patient Instructions (Signed)
Preventive Care 40-64 Years Old, Female Preventive care refers to visits with your health care provider and lifestyle choices that can promote health and wellness. This includes:  A yearly physical exam. This may also be called an annual well check.  Regular dental visits and eye exams.  Immunizations.  Screening for certain conditions.  Healthy lifestyle choices, such as eating a healthy diet, getting regular exercise, not using drugs or products that contain nicotine and tobacco, and limiting alcohol use. What can I expect for my preventive care visit? Physical exam Your health care provider will check your:  Height and weight. This may be used to calculate body mass index (BMI), which tells if you are at a healthy weight.  Heart rate and blood pressure.  Skin for abnormal spots. Counseling Your health care provider may ask you questions about your:  Alcohol, tobacco, and drug use.  Emotional well-being.  Home and relationship well-being.  Sexual activity.  Eating habits.  Work and work environment.  Method of birth control.  Menstrual cycle.  Pregnancy history. What immunizations do I need?  Influenza (flu) vaccine  This is recommended every year. Tetanus, diphtheria, and pertussis (Tdap) vaccine  You may need a Td booster every 10 years. Varicella (chickenpox) vaccine  You may need this if you have not been vaccinated. Zoster (shingles) vaccine  You may need this after age 60. Measles, mumps, and rubella (MMR) vaccine  You may need at least one dose of MMR if you were born in 1957 or later. You may also need a second dose. Pneumococcal conjugate (PCV13) vaccine  You may need this if you have certain conditions and were not previously vaccinated. Pneumococcal polysaccharide (PPSV23) vaccine  You may need one or two doses if you smoke cigarettes or if you have certain conditions. Meningococcal conjugate (MenACWY) vaccine  You may need this if you  have certain conditions. Hepatitis A vaccine  You may need this if you have certain conditions or if you travel or work in places where you may be exposed to hepatitis A. Hepatitis B vaccine  You may need this if you have certain conditions or if you travel or work in places where you may be exposed to hepatitis B. Haemophilus influenzae type b (Hib) vaccine  You may need this if you have certain conditions. Human papillomavirus (HPV) vaccine  If recommended by your health care provider, you may need three doses over 6 months. You may receive vaccines as individual doses or as more than one vaccine together in one shot (combination vaccines). Talk with your health care provider about the risks and benefits of combination vaccines. What tests do I need? Blood tests  Lipid and cholesterol levels. These may be checked every 5 years, or more frequently if you are over 50 years old.  Hepatitis C test.  Hepatitis B test. Screening  Lung cancer screening. You may have this screening every year starting at age 55 if you have a 30-pack-year history of smoking and currently smoke or have quit within the past 15 years.  Colorectal cancer screening. All adults should have this screening starting at age 50 and continuing until age 75. Your health care provider may recommend screening at age 45 if you are at increased risk. You will have tests every 1-10 years, depending on your results and the type of screening test.  Diabetes screening. This is done by checking your blood sugar (glucose) after you have not eaten for a while (fasting). You may have this   done every 1-3 years.  Mammogram. This may be done every 1-2 years. Talk with your health care provider about when you should start having regular mammograms. This may depend on whether you have a family history of breast cancer.  BRCA-related cancer screening. This may be done if you have a family history of breast, ovarian, tubal, or peritoneal  cancers.  Pelvic exam and Pap test. This may be done every 3 years starting at age 41. Starting at age 77, this may be done every 5 years if you have a Pap test in combination with an HPV test. Other tests  Sexually transmitted disease (STD) testing.  Bone density scan. This is done to screen for osteoporosis. You may have this scan if you are at high risk for osteoporosis. Follow these instructions at home: Eating and drinking  Eat a diet that includes fresh fruits and vegetables, whole grains, lean protein, and low-fat dairy.  Take vitamin and mineral supplements as recommended by your health care provider.  Do not drink alcohol if: ? Your health care provider tells you not to drink. ? You are pregnant, may be pregnant, or are planning to become pregnant.  If you drink alcohol: ? Limit how much you have to 0-1 drink a day. ? Be aware of how much alcohol is in your drink. In the [place] one drink equals one 12 oz bottle of beer (355 mL), one 5 oz glass of wine (148 mL), or one 1 oz glass of hard liquor (44 mL). Lifestyle  Take daily care of your teeth and gums.  Stay active. Exercise for at least 30 minutes on 5 or more days each week.  Do not use any products that contain nicotine or tobacco, such as cigarettes, e-cigarettes, and chewing tobacco. If you need help quitting, ask your health care provider.  If you are sexually active, practice safe sex. Use a condom or other form of birth control (contraception) in order to prevent pregnancy and STIs (sexually transmitted infections).  If told by your health care provider, take low-dose aspirin daily starting at age 50. What's next?  Visit your health care provider once a year for a well check visit.  Ask your health care provider how often you should have your eyes and teeth checked.  Stay up to date on all vaccines. This information is not intended to replace advice given to you by your health care provider. Make sure you  discuss any questions you have with your health care provider. Document Revised: 02/25/2018 Document Reviewed: 02/25/2018 Elsevier Patient Education  2020 [place].  Muscle Strain A muscle strain is an injury that occurs when a muscle is stretched beyond its normal length. Usually, a small number of muscle fibers are torn when this happens. There are three types of muscle strains. First-degree strains have the least amount of muscle fiber tearing and the least amount of pain. Second-degree and third-degree strains have more tearing and pain. Usually, recovery from muscle strain takes 1-2 weeks. Complete healing normally takes 5-6 weeks. What are the causes? This condition is caused when a sudden, violent force is placed on a muscle and stretches it too far. This may occur with a fall, lifting, or sports. What increases the risk? This condition is more likely to develop in athletes and people who are physically active. What are the signs or symptoms? Symptoms of this condition include:  Pain.  Bruising.  Swelling.  Trouble using the muscle. How is this diagnosed? This condition is  diagnosed based on a physical exam and your medical history. Tests may also be done, including an X-ray, ultrasound, or MRI. How is this treated? This condition is initially treated with PRICE therapy. This therapy involves:  Protecting the muscle from being injured again.  Resting the injured muscle.  Icing the injured muscle.  Applying pressure (compression) to the injured muscle. This may be done with a splint or elastic bandage.  Raising (elevating) the injured muscle. Your health care provider may also recommend medicine for pain. Follow these instructions at home: If you have a splint:  Wear the splint as told by your health care provider. Remove it only as told by your health care provider.  Loosen the splint if your fingers or toes tingle, become numb, or turn cold and blue.  Keep the  splint clean.  If the splint is not waterproof: ? Do not let it get wet. ? Cover it with a watertight covering when you take a bath or a shower. Managing pain, stiffness, and swelling   If directed, put ice on the injured area. ? If you have a removable splint, remove it as told by your health care provider. ? Put ice in a plastic bag. ? Place a towel between your skin and the bag. ? Leave the ice on for 20 minutes, 2-3 times a day.  Move your fingers or toes often to avoid stiffness and to lessen swelling.  Raise (elevate) the injured area above the level of your heart while you are sitting or lying down.  Wear an elastic bandage as told by your health care provider. Make sure that it is not too tight. General instructions  Take over-the-counter and prescription medicines only as told by your health care provider.  Restrict your activity and rest the injured muscle as told by your health care provider. Gentle movements may be allowed.  If physical therapy was prescribed, do exercises as told by your health care provider.  Do not put pressure on any part of the splint until it is fully hardened. This may take several hours.  Do not use any products that contain nicotine or tobacco, such as cigarettes and e-cigarettes. These can delay bone healing. If you need help quitting, ask your health care provider.  Ask your health care provider when it is safe to drive if you have a splint.  Keep all follow-up visits as told by your health care provider. This is important. How is this prevented?  Warm up before exercising. This helps to prevent future muscle strains. Contact a health care provider if:  You have more pain or swelling in the injured area. Get help right away if:  You have numbness or tingling or lose a lot of strength in the injured area. Summary  A muscle strain is an injury that occurs when a muscle is stretched beyond its normal length.  This condition is caused  when a sudden, violent force is placed on a muscle and stretches it too far.  This condition is initially treated with PRICE therapy, which involves protecting, resting, icing, compressing, and elevating.  Gentle movements may be allowed. If physical therapy was prescribed, do exercises as told by your health care provider. This information is not intended to replace advice given to you by your health care provider. Make sure you discuss any questions you have with your health care provider. Document Revised: 05/29/2017 Document Reviewed: 07/23/2016 Elsevier Patient Education  [place].  Managing Your Hypertension Hypertension is commonly  called high blood pressure. This is when the force of your blood pressing against the walls of your arteries is too strong. Arteries are blood vessels that carry blood from your heart throughout your body. Hypertension forces the heart to work harder to pump blood, and may cause the arteries to become narrow or stiff. Having untreated or uncontrolled hypertension can cause heart attack, stroke, kidney disease, and other problems. What are blood pressure readings? A blood pressure reading consists of a higher number over a lower number. Ideally, your blood pressure should be below 120/80. The first ("top") number is called the systolic pressure. It is a measure of the pressure in your arteries as your heart beats. The second ("bottom") number is called the diastolic pressure. It is a measure of the pressure in your arteries as the heart relaxes. What does my blood pressure reading mean? Blood pressure is classified into four stages. Based on your blood pressure reading, your health care provider may use the following stages to determine what type of treatment you need, if any. Systolic pressure and diastolic pressure are measured in a unit called mm Hg. Normal  Systolic pressure: below 720.  Diastolic pressure: below 80. Elevated  Systolic pressure:  947-096.  Diastolic pressure: below 80. Hypertension stage 1  Systolic pressure: 283-662.  Diastolic pressure: 94-76. Hypertension stage 2  Systolic pressure: 546 or above.  Diastolic pressure: 90 or above. What health risks are associated with hypertension? Managing your hypertension is an important responsibility. Uncontrolled hypertension can lead to:  A heart attack.  A stroke.  A weakened blood vessel (aneurysm).  Heart failure.  Kidney damage.  Eye damage.  Metabolic syndrome.  Memory and concentration problems. What changes can I make to manage my hypertension? Hypertension can be managed by making lifestyle changes and possibly by taking medicines. Your health care provider will help you make a plan to bring your blood pressure within a normal range. Eating and drinking   Eat a diet that is high in fiber and potassium, and low in salt (sodium), added sugar, and fat. An example eating plan is called the DASH (Dietary Approaches to Stop Hypertension) diet. To eat this way: ? Eat plenty of fresh fruits and vegetables. Try to fill half of your plate at each meal with fruits and vegetables. ? Eat whole grains, such as whole wheat pasta, brown rice, or whole grain bread. Fill about one quarter of your plate with whole grains. ? Eat low-fat diary products. ? Avoid fatty cuts of meat, processed or cured meats, and poultry with skin. Fill about one quarter of your plate with lean proteins such as fish, chicken without skin, beans, eggs, and tofu. ? Avoid premade and processed foods. These tend to be higher in sodium, added sugar, and fat.  Reduce your daily sodium intake. Most people with hypertension should eat less than 1,500 mg of sodium a day.  Limit alcohol intake to no more than 1 drink a day for nonpregnant women and 2 drinks a day for men. One drink equals 12 oz of beer, 5 oz of wine, or 1 oz of hard liquor. Lifestyle  Work with your health care provider to  maintain a healthy body weight, or to lose weight. Ask what an ideal weight is for you.  Get at least 30 minutes of exercise that causes your heart to beat faster (aerobic exercise) most days of the week. Activities may include walking, swimming, or biking.  Include exercise to strengthen your muscles (resistance  exercise), such as weight lifting, as part of your weekly exercise routine. Try to do these types of exercises for 30 minutes at least 3 days a week.  Do not use any products that contain nicotine or tobacco, such as cigarettes and e-cigarettes. If you need help quitting, ask your health care provider.  Control any long-term (chronic) conditions you have, such as high cholesterol or diabetes. Monitoring  Monitor your blood pressure at home as told by your health care provider. Your personal target blood pressure may vary depending on your medical conditions, your age, and other factors.  Have your blood pressure checked regularly, as often as told by your health care provider. Working with your health care provider  Review all the medicines you take with your health care provider because there may be side effects or interactions.  Talk with your health care provider about your diet, exercise habits, and other lifestyle factors that may be contributing to hypertension.  Visit your health care provider regularly. Your health care provider can help you create and adjust your plan for managing hypertension. Will I need medicine to control my blood pressure? Your health care provider may prescribe medicine if lifestyle changes are not enough to get your blood pressure under control, and if:  Your systolic blood pressure is 130 or higher.  Your diastolic blood pressure is 80 or higher. Take medicines only as told by your health care provider. Follow the directions carefully. Blood pressure medicines must be taken as prescribed. The medicine does not work as well when you skip doses.  Skipping doses also puts you at risk for problems. Contact a health care provider if:  You think you are having a reaction to medicines you have taken.  You have repeated (recurrent) headaches.  You feel dizzy.  You have swelling in your ankles.  You have trouble with your vision. Get help right away if:  You develop a severe headache or confusion.  You have unusual weakness or numbness, or you feel faint.  You have severe pain in your chest or abdomen.  You vomit repeatedly.  You have trouble breathing. Summary  Hypertension is when the force of blood pumping through your arteries is too strong. If this condition is not controlled, it may put you at risk for serious complications.  Your personal target blood pressure may vary depending on your medical conditions, your age, and other factors. For most people, a normal blood pressure is less than 120/80.  Hypertension is managed by lifestyle changes, medicines, or both. Lifestyle changes include weight loss, eating a healthy, low-sodium diet, exercising more, and limiting alcohol. This information is not intended to replace advice given to you by your health care provider. Make sure you discuss any questions you have with your health care provider. Document Revised: 10/08/2018 Document Reviewed: 05/14/2016 Elsevier Patient Education  [place], Adult After being diagnosed with an anxiety disorder, you may be relieved to know why you have felt or behaved a certain way. You may also feel overwhelmed about the treatment ahead and what it will mean for your life. With care and support, you can manage this condition and recover from it. How to manage lifestyle changes Managing stress and anxiety  Stress is your body's reaction to life changes and events, both good and bad. Most stress will last just a few hours, but stress can be ongoing and can lead to more than just stress. Although stress can play a  major role  in anxiety, it is not the same as anxiety. Stress is usually caused by something external, such as a deadline, test, or competition. Stress normally passes after the triggering event has ended.  Anxiety is caused by something internal, such as imagining a terrible outcome or worrying that something will go wrong that will devastate you. Anxiety often does not go away even after the triggering event is over, and it can become long-term (chronic) worry. It is important to understand the differences between stress and anxiety and to manage your stress effectively so that it does not lead to an anxious response. Talk with your health care provider or a counselor to learn more about reducing anxiety and stress. He or she may suggest tension reduction techniques, such as:  Music therapy. This can include creating or listening to music that you enjoy and that inspires you.  Mindfulness-based meditation. This involves being aware of your normal breaths while not trying to control your breathing. It can be done while sitting or walking.  Centering prayer. This involves focusing on a word, phrase, or sacred image that means something to you and brings you peace.  Deep breathing. To do this, expand your stomach and inhale slowly through your nose. Hold your breath for 3-5 seconds. Then exhale slowly, letting your stomach muscles relax.  Self-talk. This involves identifying thought patterns that lead to anxiety reactions and changing those patterns.  Muscle relaxation. This involves tensing muscles and then relaxing them. Choose a tension reduction technique that suits your lifestyle and personality. These techniques take time and practice. Set aside 5-15 minutes a day to do them. Therapists can offer counseling and training in these techniques. The training to help with anxiety may be covered by some insurance plans. Other things you can do to manage stress and anxiety include:  Keeping a  stress/anxiety diary. This can help you learn what triggers your reaction and then learn ways to manage your response.  Thinking about how you react to certain situations. You may not be able to control everything, but you can control your response.  Making time for activities that help you relax and not feeling guilty about spending your time in this way.  Visual imagery and yoga can help you stay calm and relax.  Medicines Medicines can help ease symptoms. Medicines for anxiety include:  Anti-anxiety drugs.  Antidepressants. Medicines are often used as a primary treatment for anxiety disorder. Medicines will be prescribed by a health care provider. When used together, medicines, psychotherapy, and tension reduction techniques may be the most effective treatment. Relationships Relationships can play a big part in helping you recover. Try to spend more time connecting with trusted friends and family members. Consider going to couples counseling, taking family education classes, or going to family therapy. Therapy can help you and others better understand your condition. How to recognize changes in your anxiety Everyone responds differently to treatment for anxiety. Recovery from anxiety happens when symptoms decrease and stop interfering with your daily activities at home or work. This may mean that you will start to:  Have better concentration and focus. Worry will interfere less in your daily thinking.  Sleep better.  Be less irritable.  Have more energy.  Have improved memory. It is important to recognize when your condition is getting worse. Contact your health care provider if your symptoms interfere with home or work and you feel like your condition is not improving. Follow these instructions at home: Activity  Exercise. Most adults should  do the following: ? Exercise for at least 150 minutes each week. The exercise should increase your heart rate and make you sweat  (moderate-intensity exercise). ? Strengthening exercises at least twice a week.  Get the right amount and quality of sleep. Most adults need 7-9 hours of sleep each night. Lifestyle   Eat a healthy diet that includes plenty of vegetables, fruits, whole grains, low-fat dairy products, and lean protein. Do not eat a lot of foods that are high in solid fats, added sugars, or salt.  Make choices that simplify your life.  Do not use any products that contain nicotine or tobacco, such as cigarettes, e-cigarettes, and chewing tobacco. If you need help quitting, ask your health care provider.  Avoid caffeine, alcohol, and certain over-the-counter cold medicines. These may make you feel worse. Ask your pharmacist which medicines to avoid. General instructions  Take over-the-counter and prescription medicines only as told by your health care provider.  Keep all follow-up visits as told by your health care provider. This is important. Where to find support You can get help and support from these sources:  Self-help groups.  Online and [employer].  A trusted spiritual leader.  Couples counseling.  Family education classes.  Family therapy. Where to find more information You may find that joining a support group helps you deal with your anxiety. The following sources can help you locate counselors or support groups near you:  [place]: www.mentalhealth[place].net  Anxiety and Depression Association of [place] (ADAA): https://www.[place].net/  National Alliance on Mental Illness (NAMI): www.nami.org Contact a health care provider if you:  Have a hard time staying focused or finishing daily tasks.  Spend many hours a day feeling worried about everyday life.  Become exhausted by worry.  Start to have headaches, feel tense, or have nausea.  Urinate more than normal.  Have diarrhea. Get help right away if you have:  A racing heart and shortness of  breath.  Thoughts of hurting yourself or others. If you ever feel like you may hurt yourself or others, or have thoughts about taking your own life, get help right away. You can go to your nearest emergency department or call:  Your local emergency services (911 in the [place]  A suicide crisis helpline, such as the [facility] at 725-179-6185. This is open 24 hours a day. Summary  Taking steps to learn and use tension reduction techniques can help calm you and help prevent triggering an anxiety reaction.  When used together, medicines, psychotherapy, and tension reduction techniques may be the most effective treatment.  Family, friends, and partners can play a big part in helping you recover from an anxiety disorder. This information is not intended to replace advice given to you by your health care provider. Make sure you discuss any questions you have with your health care provider. Document Revised: 11/16/2018 Document Reviewed: 11/16/2018 Elsevier Patient Education  [place].

## 2019-10-20 NOTE — Progress Notes (Signed)
Subjective:     Donna Woodward is a 64 y.o. female and is here for a follow-up on chronic conditions and physical exam. The patient reports no problems.  Denies trouble with memory, hearing, or vision.  Denies any falls.  Pt working to decrease stress/anxiety.  States her roommate is moving out over the wknd which should help some.  Pt continues to use MJ.  Denies tobacco use.  Pt doing a lot of yard work, but not wearing sunscreen.  Notes recent sharp pain in R upper chest after doing work around the house.  Denies edema, deformity, SOB, numbness or tingling in UEs.  Has chronic back pain.  States BP has been 130s-140s/80s-90.   Patient denies recent Pap, mammogram  Social History   Socioeconomic History  . Marital status: Single    Spouse name: Not on file  . Number of children: Not on file  . Years of education: 32  . Highest education level: Not on file  Occupational History  . Occupation: disabled    Comment: warehouse  Tobacco Use  . Smoking status: Former Smoker    Quit date: 06/30/2006    Years since quitting: 13.3  . Smokeless tobacco: Never Used  Substance and Sexual Activity  . Alcohol use: Yes    Comment: rare  . Drug use: Yes    Types: Marijuana  . Sexual activity: Not Currently  Other Topics Concern  . 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   Grew up in [place] and lived here for since 2014.    Disabled with back pain.    Social Determinants of Health   Financial Resource Strain:   . Difficulty of Paying Living Expenses:   Food Insecurity:   . Worried About Charity fundraiser in the Last Year:   . Arboriculturist in the Last Year:   Transportation Needs:   . Film/video editor (Medical):   Marland Kitchen Lack of Transportation (Non-Medical):   Physical Activity:   . Days of Exercise per Week:   . Minutes of Exercise per Session:   Stress:   . Feeling of Stress :   Social Connections:   . Frequency of  Communication with Friends and Family:   . Frequency of Social Gatherings with Friends and Family:   . Attends Religious Services:   . Active Member of Clubs or Organizations:   . Attends Archivist Meetings:   Marland Kitchen Marital Status:   Intimate Partner Violence:   . Fear of Current or Ex-Partner:   . Emotionally Abused:   Marland Kitchen Physically Abused:   . Sexually Abused:    Health Maintenance  Topic Date Due  . COVID-19 Vaccine (1) Never done  . PAP SMEAR-Modifier  09/21/2014  . MAMMOGRAM  08/22/2020 (Originally 05/08/2019)  . INFLUENZA VACCINE  01/29/2020  . COLONOSCOPY  06/30/2020  . TETANUS/TDAP  11/07/2027  . Hepatitis C Screening  Completed  . HIV Screening  Completed    The following portions of the patient's history were reviewed and updated as appropriate: allergies, current medications, past family history, past medical history, past social history, past surgical history and problem list.  Review of Systems Pertinent items noted in HPI and remainder of comprehensive ROS otherwise negative.   Objective:    BP 138/84 (BP Location: Left Arm, Patient Position: Sitting, Cuff Size: Large)   Pulse 60   Temp 97.9 F (36.6 C) (Temporal)  Wt 176 lb 9.6 oz (80.1 kg)   SpO2 97%   BMI 30.31 kg/m  General appearance: alert, cooperative and no distress Head: Normocephalic, without obvious abnormality, atraumatic Eyes: conjunctivae/corneas clear. PERRL, EOM's intact. Fundi benign. Ears: normal TM's and external ear canals both ears Nose: Nares normal. Septum midline. Mucosa normal. No drainage or sinus tenderness. Throat: lips, mucosa, and tongue normal; teeth and gums normal Neck: no adenopathy, no carotid bruit, no JVD, supple, symmetrical, trachea midline and thyroid not enlarged, symmetric, no tenderness/mass/nodules Lungs: clear to auscultation bilaterally and faint scattered wheezes Heart: regular rate and rhythm, S1, S2 normal, no murmur, click, rub or gallop Abdomen:  soft, non-tender; bowel sounds normal; no masses,  no organomegaly Extremities: extremities normal, atraumatic, no cyanosis or edema Pulses: 2+ and symmetric Skin: Skin color, texture, turgor normal. 1 cm mildly erythematous lesion on upper abdomen to R of midline.  Two 2 mm rough dry patches on helix L ear.  Severe sun damage to skin on neck and back. 1.5-2 cm pedunculated mass on posterior medial  L shoulder/upper back without erythema or drainage. Lymph nodes: Cervical, supraclavicular, and axillary nodes normal. Neurologic: Alert and oriented X 3, normal strength and tone. Normal symmetric reflexes. Normal coordination and gait    Assessment:    Healthy female exam.      Plan:     Anticipatory guidance given including wearing seatbelts, smoke detectors in the home, increasing physical activity, increasing p.o. intake of water and vegetables. -will obtain labs -discussed pap.  Pt declined.  Encouraged to strongly consider. -pt to schedule mammogram -Colonoscopy due next yr as [place] and normal per pt. -given handout -next wellness visit in 1 yr See After Visit Summary for Counseling Recommendations    Essential hypertension  -Mildly elevated -Discussed lifestyle modifications -Continue current medications including lisinopril-hydrochlorothiazide 20-25 mg, Toprol-XL 100 mg - Plan: Comprehensive metabolic panel  Anxiety  -PHQ-9 score 8.  Was 13 on 08/23/2019 -GAD-7 score 4 -Discussed counseling. -Discussed ways to reduce stress and deal with anxiety. - Plan: CBC with Differential/Platelet, TSH, T4, Free, Hemoglobin A1c  Muscle strain of chest wall, initial encounter -Discussed supportive care including stretching, ice, heat, NSAIDs as needed -We will continue to monitor  Mixed hyperlipidemia  -Continue simvastatin 40 mg -Continue lifestyle modifications - Plan: Lipid panel  Actinic keratosis -2 areas noted on left ear -Discussed wearing  sunscreen and protective clothing when outside. -Discussed removal. - Plan: Ambulatory referral to Dermatology  Follow-up in the next few months, as needed  Grier Mitts, MD

## 2019-10-21 ENCOUNTER — Other Ambulatory Visit: Payer: Self-pay | Admitting: Family Medicine

## 2019-10-21 DIAGNOSIS — E876 Hypokalemia: Secondary | ICD-10-CM

## 2019-10-21 DIAGNOSIS — D72829 Elevated white blood cell count, unspecified: Secondary | ICD-10-CM

## 2019-10-21 MED ORDER — POTASSIUM CHLORIDE CRYS ER 20 MEQ PO TBCR: 20.0000 meq | EXTENDED_RELEASE_TABLET | Freq: Two times a day (BID) | ORAL | 0 refills | Status: DC

## 2019-11-12 ENCOUNTER — Ambulatory Visit: Payer: Medicare HMO | Attending: Internal Medicine

## 2019-11-12 DIAGNOSIS — Z23 Encounter for immunization: Secondary | ICD-10-CM

## 2019-11-12 NOTE — Progress Notes (Signed)
   Covid-19 Vaccination Clinic  Name:  Donna Woodward    MRN: [id number] DOB: 02-May-1956  11/12/2019  [name] was observed post Covid-19 immunization for 15 minutes without incident. She was provided with Vaccine Information Sheet and instruction to access the V-Safe system.   [name] was instructed to call 911 with any severe reactions post vaccine: Marland Kitchen Difficulty breathing  . Swelling of face and throat  . A fast heartbeat  . A bad rash all over body  . Dizziness and weakness   Immunizations Administered    Name Date Dose VIS Date Route   Pfizer COVID-19 Vaccine 11/12/2019 10:23 AM 0.3 mL 08/24/2018 Intramuscular   Manufacturer: [place]   Lot: [id number]   [place]: [id number]

## 2019-12-05 ENCOUNTER — Ambulatory Visit: Payer: Medicare HMO | Attending: Internal Medicine

## 2019-12-05 DIAGNOSIS — Z23 Encounter for immunization: Secondary | ICD-10-CM

## 2019-12-05 NOTE — Progress Notes (Signed)
° °  Covid-19 Vaccination Clinic  Name:  Donna Woodward    MRN: [id number] DOB: [birth year]  12/05/2019  [name] was observed post Covid-19 immunization for 15 minutes without incident. She was provided with Vaccine Information Sheet and instruction to access the V-Safe system.   [name] was instructed to call 911 with any severe reactions post vaccine:  Difficulty breathing   Swelling of face and throat   A fast heartbeat   A bad rash all over body   Dizziness and weakness   Immunizations Administered    Name Date Dose VIS Date Route   Pfizer COVID-19 Vaccine 12/05/2019 12:04 PM 0.3 mL 08/24/2018 Intramuscular   Manufacturer: [place]   Lot: [id number]   [place]: [id number]

## 2019-12-10 ENCOUNTER — Other Ambulatory Visit: Payer: Self-pay | Admitting: Family Medicine

## 2019-12-21 ENCOUNTER — Other Ambulatory Visit: Payer: Self-pay | Admitting: Cardiovascular Disease

## 2020-01-03 DIAGNOSIS — L280 Lichen simplex chronicus: Secondary | ICD-10-CM | POA: Diagnosis not present

## 2020-01-03 DIAGNOSIS — D1801 Hemangioma of skin and subcutaneous tissue: Secondary | ICD-10-CM | POA: Diagnosis not present

## 2020-01-03 DIAGNOSIS — L578 Other skin changes due to chronic exposure to nonionizing radiation: Secondary | ICD-10-CM | POA: Diagnosis not present

## 2020-01-03 DIAGNOSIS — D225 Melanocytic nevi of trunk: Secondary | ICD-10-CM | POA: Diagnosis not present

## 2020-01-03 DIAGNOSIS — L814 Other melanin hyperpigmentation: Secondary | ICD-10-CM | POA: Diagnosis not present

## 2020-01-03 DIAGNOSIS — L821 Other seborrheic keratosis: Secondary | ICD-10-CM | POA: Diagnosis not present

## 2020-02-09 ENCOUNTER — Other Ambulatory Visit: Payer: Self-pay

## 2020-02-09 ENCOUNTER — Emergency Department (HOSPITAL_COMMUNITY)
Admission: EM | Admit: 2020-02-09 | Discharge: 2020-02-10 | Disposition: A | Discharge status: Home / Self Care | Payer: Medicare HMO | Attending: Emergency Medicine | Admitting: Emergency Medicine

## 2020-02-09 ENCOUNTER — Ambulatory Visit (HOSPITAL_COMMUNITY): Admission: EM | Admit: 2020-02-09 | Discharge: 2020-02-09 | Payer: Medicare HMO

## 2020-02-09 ENCOUNTER — Encounter (HOSPITAL_COMMUNITY): Payer: Self-pay

## 2020-02-09 ENCOUNTER — Telehealth: Payer: Self-pay | Admitting: Family Medicine

## 2020-02-09 DIAGNOSIS — Z5321 Procedure and treatment not carried out due to patient leaving prior to being seen by health care provider: Secondary | ICD-10-CM | POA: Insufficient documentation

## 2020-02-09 DIAGNOSIS — H539 Unspecified visual disturbance: Secondary | ICD-10-CM | POA: Diagnosis not present

## 2020-02-09 DIAGNOSIS — R519 Headache, unspecified: Secondary | ICD-10-CM | POA: Insufficient documentation

## 2020-02-09 DIAGNOSIS — R531 Weakness: Secondary | ICD-10-CM | POA: Diagnosis not present

## 2020-02-09 LAB — BASIC METABOLIC PANEL
Anion gap: 12 (ref 5–15)
BUN: 19 mg/dL (ref 8–23)
CO2: 28 mmol/L (ref 22–32)
Calcium: 9.7 mg/dL (ref 8.9–10.3)
Chloride: 101 mmol/L (ref 98–111)
Creatinine, Ser: 0.98 mg/dL (ref 0.44–1.00)
GFR calc Af Amer: >60 mL/min (ref >60)
GFR calc non Af Amer: >60 mL/min (ref >60)
Glucose, Bld: 117 mg/dL — ABNORMAL HIGH (ref 70–99)
Potassium: 3.1 mmol/L — ABNORMAL LOW (ref 3.5–5.1)
Sodium: 141 mmol/L (ref 135–145)

## 2020-02-09 LAB — CBC
HCT: 42.9 % (ref 36.0–46.0)
Hemoglobin: 13.8 g/dL (ref 12.0–15.0)
MCH: 28 pg (ref 26.0–34.0)
MCHC: 32.2 g/dL (ref 30.0–36.0)
MCV: 87.2 fL (ref 80.0–100.0)
Platelets: 221 10*3/uL (ref 150–400)
RBC: 4.92 MIL/uL (ref 3.87–5.11)
RDW: 14.6 % (ref 11.5–15.5)
WBC: 12.6 10*3/uL — ABNORMAL HIGH (ref 4.0–10.5)
nRBC: 0 % (ref 0.0–0.2)

## 2020-02-09 NOTE — ED Notes (Signed)
Patient is being discharged from the Urgent Care and sent to the Emergency Department via personal vehicle . Per Dr Meda Coffee, patient is in need of higher level of care due to possible TIA. Patient is aware and verbalizes understanding of plan of care.  Vitals:   02/09/20 1413  BP: (!) 117/103  Pulse: 70  Resp: 18  Temp: 98.1 F (36.7 C)  SpO2: 97%

## 2020-02-09 NOTE — ED Triage Notes (Signed)
Pt arrives to ED w/ c/o intermittent vision changes (blind spots) since Monday. Pt also reports 4/10 headache that started today. Pt denies n/v, unilateral weakness, speech difficulty. AOx4 and otherwise neuro intact. Pt states her vision in normal currently.

## 2020-02-09 NOTE — Telephone Encounter (Signed)
Spoke with the pt, attempted to tell the pt to go to an urgent care or emergency room and she stated she is currently at an urgent care.  Message sent to PCP.

## 2020-02-09 NOTE — ED Notes (Signed)
Pt was told the risk of leaving and encouraged to stay. Pt said the wait was to long he could not  wait.

## 2020-02-09 NOTE — Telephone Encounter (Signed)
Pt called to say when she was outside mowing the lawn and now she has flashing lights in her vision.  Please call pt at .[phone]

## 2020-02-09 NOTE — ED Triage Notes (Signed)
Pt presents with vision blind spots and headache X 2 days.

## 2020-02-14 ENCOUNTER — Other Ambulatory Visit: Payer: Self-pay | Admitting: Family Medicine

## 2020-02-14 DIAGNOSIS — E876 Hypokalemia: Secondary | ICD-10-CM

## 2020-02-14 MED ORDER — POTASSIUM CHLORIDE CRYS ER 20 MEQ PO TBCR: 40.0000 meq | EXTENDED_RELEASE_TABLET | Freq: Every day | ORAL | 2 refills | Status: DC

## 2020-02-14 NOTE — Telephone Encounter (Signed)
Pt seen at [facility] and advised to go to ED, however LWOT.  BMP with hypokalemia in ED.  Rx for chlor-con sent to pt's mail order pharmacy.  Pt should take 40 mEq daily.  Can recheck potassium in next 1-2 wks.  Order placed.

## 2020-02-16 NOTE — Telephone Encounter (Signed)
Left a detailed message for pt to call the office and schedule a lab appointment in 1-2 weeks for potassium recheck

## 2020-02-18 NOTE — Telephone Encounter (Signed)
Pt is scheduled for lab on 03/01/2020

## 2020-02-18 NOTE — Telephone Encounter (Signed)
Pt order is on Epic

## 2020-02-24 ENCOUNTER — Other Ambulatory Visit: Payer: Self-pay | Admitting: Family Medicine

## 2020-03-01 ENCOUNTER — Other Ambulatory Visit: Payer: Self-pay

## 2020-03-01 ENCOUNTER — Other Ambulatory Visit: Payer: Medicare HMO

## 2020-03-01 DIAGNOSIS — E876 Hypokalemia: Secondary | ICD-10-CM

## 2020-03-02 LAB — BASIC METABOLIC PANEL WITH GFR
BUN: 17 mg/dL (ref 7–25)
CO2: 26 mmol/L (ref 20–32)
Calcium: 9.7 mg/dL (ref 8.6–10.4)
Chloride: 104 mmol/L (ref 98–110)
Creat: 0.67 mg/dL (ref 0.50–0.99)
GFR, Est African American: 108 mL/min/{1.73_m2} (ref >=60)
GFR, Est Non African American: 93 mL/min/{1.73_m2} (ref >=60)
Glucose, Bld: 97 mg/dL (ref 65–99)
Potassium: 3.9 mmol/L (ref 3.5–5.3)
Sodium: 140 mmol/L (ref 135–146)

## 2020-03-03 ENCOUNTER — Other Ambulatory Visit: Payer: Self-pay | Admitting: Cardiovascular Disease

## 2020-03-07 ENCOUNTER — Other Ambulatory Visit: Payer: Self-pay | Admitting: Family Medicine

## 2020-05-02 ENCOUNTER — Emergency Department (HOSPITAL_COMMUNITY): Payer: Medicare HMO

## 2020-05-02 ENCOUNTER — Encounter (HOSPITAL_COMMUNITY): Payer: Self-pay | Admitting: Emergency Medicine

## 2020-05-02 ENCOUNTER — Other Ambulatory Visit: Payer: Self-pay

## 2020-05-02 ENCOUNTER — Observation Stay (HOSPITAL_COMMUNITY)
Admission: EM | Admit: 2020-05-02 | Discharge: 2020-05-03 | Disposition: A | Discharge status: Home / Self Care | Payer: Medicare HMO | Attending: Internal Medicine | Admitting: Internal Medicine

## 2020-05-02 DIAGNOSIS — R079 Chest pain, unspecified: Secondary | ICD-10-CM | POA: Diagnosis not present

## 2020-05-02 DIAGNOSIS — Z20822 Contact with and (suspected) exposure to covid-19: Secondary | ICD-10-CM | POA: Diagnosis not present

## 2020-05-02 DIAGNOSIS — Z23 Encounter for immunization: Secondary | ICD-10-CM | POA: Insufficient documentation

## 2020-05-02 DIAGNOSIS — R55 Syncope and collapse: Secondary | ICD-10-CM | POA: Diagnosis not present

## 2020-05-02 DIAGNOSIS — Z87891 Personal history of nicotine dependence: Secondary | ICD-10-CM | POA: Insufficient documentation

## 2020-05-02 DIAGNOSIS — I2510 Atherosclerotic heart disease of native coronary artery without angina pectoris: Secondary | ICD-10-CM | POA: Diagnosis not present

## 2020-05-02 DIAGNOSIS — R519 Headache, unspecified: Secondary | ICD-10-CM

## 2020-05-02 DIAGNOSIS — Z7982 Long term (current) use of aspirin: Secondary | ICD-10-CM | POA: Insufficient documentation

## 2020-05-02 DIAGNOSIS — Z951 Presence of aortocoronary bypass graft: Secondary | ICD-10-CM

## 2020-05-02 DIAGNOSIS — Z79899 Other long term (current) drug therapy: Secondary | ICD-10-CM | POA: Insufficient documentation

## 2020-05-02 DIAGNOSIS — I10 Essential (primary) hypertension: Secondary | ICD-10-CM | POA: Diagnosis not present

## 2020-05-02 DIAGNOSIS — R42 Dizziness and giddiness: Secondary | ICD-10-CM | POA: Diagnosis not present

## 2020-05-02 LAB — URINALYSIS, ROUTINE W REFLEX MICROSCOPIC
Bilirubin Urine: NEGATIVE
Glucose, UA: NEGATIVE mg/dL
Ketones, ur: NEGATIVE mg/dL
Nitrite: NEGATIVE
Protein, ur: NEGATIVE mg/dL
Specific Gravity, Urine: 1.01 (ref 1.005–1.030)
pH: 5 (ref 5.0–8.0)

## 2020-05-02 LAB — TROPONIN I (HIGH SENSITIVITY)
Troponin I (High Sensitivity): 9 ng/L (ref <18)
Troponin I (High Sensitivity): 9 ng/L (ref <18)

## 2020-05-02 LAB — BASIC METABOLIC PANEL
Anion gap: 13 (ref 5–15)
BUN: 15 mg/dL (ref 8–23)
CO2: 27 mmol/L (ref 22–32)
Calcium: 9.6 mg/dL (ref 8.9–10.3)
Chloride: 99 mmol/L (ref 98–111)
Creatinine, Ser: 1.03 mg/dL — ABNORMAL HIGH (ref 0.44–1.00)
GFR, Estimated: >60 mL/min (ref >60)
Glucose, Bld: 111 mg/dL — ABNORMAL HIGH (ref 70–99)
Potassium: 3 mmol/L — ABNORMAL LOW (ref 3.5–5.1)
Sodium: 139 mmol/L (ref 135–145)

## 2020-05-02 LAB — RESPIRATORY PANEL BY RT PCR (FLU A&B, COVID)
Influenza A by PCR: NEGATIVE
Influenza B by PCR: NEGATIVE
SARS Coronavirus 2 by RT PCR: NEGATIVE

## 2020-05-02 LAB — CBC
HCT: 43 % (ref 36.0–46.0)
Hemoglobin: 13.9 g/dL (ref 12.0–15.0)
MCH: 28.7 pg (ref 26.0–34.0)
MCHC: 32.3 g/dL (ref 30.0–36.0)
MCV: 88.8 fL (ref 80.0–100.0)
Platelets: 199 10*3/uL (ref 150–400)
RBC: 4.84 MIL/uL (ref 3.87–5.11)
RDW: 14.6 % (ref 11.5–15.5)
WBC: 11.3 10*3/uL — ABNORMAL HIGH (ref 4.0–10.5)
nRBC: 0 % (ref 0.0–0.2)

## 2020-05-02 LAB — D-DIMER, QUANTITATIVE: D-Dimer, Quant: 0.49 ug/mL-FEU (ref 0.00–0.50)

## 2020-05-02 LAB — CBG MONITORING, ED: Glucose-Capillary: 94 mg/dL (ref 70–99)

## 2020-05-02 MED ORDER — SIMVASTATIN 20 MG PO TABS
40.0000 mg | ORAL_TABLET | Freq: Every evening | ORAL | Status: DC
  Administered 2020-05-02: 40 mg via ORAL
  Filled 2020-05-02: qty 2

## 2020-05-02 MED ORDER — HYDRALAZINE HCL 20 MG/ML IJ SOLN
10.0000 mg | INTRAMUSCULAR | Status: DC | PRN
  Administered 2020-05-03 (×3): 10 mg via INTRAVENOUS
  Filled 2020-05-02 (×3): qty 1

## 2020-05-02 MED ORDER — HYDROCHLOROTHIAZIDE 25 MG PO TABS
25.0000 mg | ORAL_TABLET | Freq: Every morning | ORAL | Status: DC
  Administered 2020-05-03: 25 mg via ORAL
  Filled 2020-05-02: qty 1

## 2020-05-02 MED ORDER — LISINOPRIL 10 MG PO TABS
20.0000 mg | ORAL_TABLET | Freq: Every day | ORAL | Status: DC
  Administered 2020-05-03: 20 mg via ORAL
  Filled 2020-05-02: qty 2

## 2020-05-02 MED ORDER — ASPIRIN EC 81 MG PO TBEC
81.0000 mg | DELAYED_RELEASE_TABLET | Freq: Every day | ORAL | Status: DC
  Administered 2020-05-03: 81 mg via ORAL
  Filled 2020-05-02: qty 1

## 2020-05-02 MED ORDER — ENOXAPARIN SODIUM 40 MG/0.4ML ~~LOC~~ SOLN: 40.0000 mg | SUBCUTANEOUS | Status: DC

## 2020-05-02 MED ORDER — OMEGA-3-ACID ETHYL ESTERS 1 G PO CAPS
1.0000 g | ORAL_CAPSULE | Freq: Every day | ORAL | Status: DC
  Administered 2020-05-03: 1 g via ORAL
  Filled 2020-05-02 (×2): qty 1

## 2020-05-02 MED ORDER — ASPIRIN 325 MG PO TABS: 325.0000 mg | ORAL_TABLET | Freq: Once | ORAL | Status: DC

## 2020-05-02 MED ORDER — ACETAMINOPHEN 325 MG PO TABS
650.0000 mg | ORAL_TABLET | Freq: Four times a day (QID) | ORAL | Status: DC | PRN
  Administered 2020-05-03 (×3): 650 mg via ORAL
  Filled 2020-05-02 (×3): qty 2

## 2020-05-02 MED ORDER — POTASSIUM CHLORIDE CRYS ER 20 MEQ PO TBCR: 40.0000 meq | EXTENDED_RELEASE_TABLET | Freq: Every evening | ORAL | Status: DC

## 2020-05-02 MED ORDER — METOPROLOL SUCCINATE ER 100 MG PO TB24
100.0000 mg | ORAL_TABLET | Freq: Every evening | ORAL | Status: DC
  Administered 2020-05-02: 100 mg via ORAL
  Filled 2020-05-02: qty 1

## 2020-05-02 MED ORDER — ASPIRIN 81 MG PO CHEW
324.0000 mg | CHEWABLE_TABLET | Freq: Once | ORAL | Status: AC
  Administered 2020-05-02: 324 mg via ORAL
  Filled 2020-05-02: qty 4

## 2020-05-02 MED ORDER — INFLUENZA VAC SPLIT QUAD 0.5 ML IM SUSY
0.5000 mL | PREFILLED_SYRINGE | INTRAMUSCULAR | Status: AC
  Administered 2020-05-03: 0.5 mL via INTRAMUSCULAR
  Filled 2020-05-02: qty 0.5

## 2020-05-02 MED ORDER — POTASSIUM CHLORIDE CRYS ER 20 MEQ PO TBCR
40.0000 meq | EXTENDED_RELEASE_TABLET | Freq: Once | ORAL | Status: AC
  Administered 2020-05-02: 40 meq via ORAL
  Filled 2020-05-02: qty 2

## 2020-05-02 MED ORDER — CYCLOBENZAPRINE HCL 10 MG PO TABS: 5.0000 mg | ORAL_TABLET | Freq: Every evening | ORAL | Status: DC | PRN

## 2020-05-02 NOTE — Consult Note (Addendum)
Cardiology Consultation:   Patient ID: [name] MRN: [id number]; DOB: [birth year]  Admit date: 05/02/2020 Date of Consult: 05/02/2020  Primary Care Provider: Billie Ruddy, MD [facility] Cardiologist: Donna Rouge, MD Donna Woodward Electrophysiologist:  None    Patient Profile:   Donna Woodward is a 64 y.o. female with a history of CAD s/p CABG in 2017, PVCs and short runs of NSVT and SVT noted on monitor in 2018, hypertension, hyperlipidemia, GERD, chronic back pain, anxiety, and depression who is being seen today for the evaluation of palpitations at the request of Donna Woodward.  History of Present Illness:   Donna Woodward is a 64 year old female with the above history who is followed by Donna Woodward.  Patient state of health until this afternoon when she was putting away her groceries and all of a sudden started having tunnel vision, lightheadedness, dizziness, headache, and sensation of heart racing. She thought she may pass out so she sat on the floor and took deep breaths. Symptoms lasted for about 20 minutes and then resolved on their own. She measured her vital signs at the time - BP was 189/90 and heart rate was 99. She states she took 2 doses of Tylenol because she thought that may help. She does not a "tightness" in her left chest "muscle" during this 20 minute time span. She describes this as a superficial sensation and states it felt very different than prior symptoms before CABG. No shortness of breath, orthopnea, PND, or significant edema. No recent fevers or illnesses. No abnormal bleeding in urine or stools.  In the ED, patient hypertensive with BP of 168/98 but vitals stable. EKG showed normal sinus rhytm, rate 67 bpm, with PAC, T wave inversion in lead III, ST depression in lead I and aVR, and mild ST elevation in aVR. EKG was reviewed with STEMI MD and not felt to be consistent with STEMI. Chest x-ray showed no acute findings. WBC 11.3, Hgb 13.9, Plts 199. Cardiology  was consulted for further evaluation given abnormal EKG.  At the time of this evaluation, patient continues to report mild headache but notes improvement in this. She also notes a little cramping in her left upper abdomen. No other complaints at this time.   Past Medical History:  Diagnosis Date   Anxiety    Back pain    Coronary artery disease    CABGx3; Seen by Donna Woodward   Degenerative joint disease (DJD) of lumbar spine 03/24/2016   Depression    GERD (gastroesophageal reflux disease)    if overweight   Hyperlipidemia    Hypertension     Past Surgical History:  Procedure Laterality Date   BACK SURGERY     CARDIAC CATHETERIZATION N/A 06/05/2016   Procedure: Left Heart Cath and Coronary Angiography;  Surgeon: Donna Crome, MD;  Location: [place] [facility];  Service: Cardiovascular;  Laterality: N/A;   CORONARY ARTERY BYPASS GRAFT N/A 06/17/2016   Procedure: CORONARY ARTERY BYPASS GRAFTING (CABG), ON PUMP, TIMES THREE, USING LEFT INTERNAL MAMMARY ARTERY, RIGHT GREATER SAPHENOUS VEIN HARVESTED ENDOSCOPICALLY;  Surgeon: Donna Poot, MD;  Location: [place];  Service: Open Heart Surgery;  Laterality: N/A;   MULTIPLE TOOTH EXTRACTIONS     SPINE SURGERY  1990   lamnectomy L5S1   TEE WITHOUT CARDIOVERSION N/A 06/17/2016   Procedure: TRANSESOPHAGEAL ECHOCARDIOGRAM (TEE);  Surgeon: Donna Poot, MD;  Location: [place];  Service: Open Heart Surgery;  Laterality: N/A;     Home  Medications:  Prior to Admission medications   Medication Sig Start Date End Date Taking? Authorizing Provider  aspirin EC 81 MG tablet Take 1 tablet (81 mg total) by mouth daily. 11/06/17   Donna Macadam, MD  Calcium Carb-Cholecalciferol (HM CALCIUM-VITAMIN D) 600-800 MG-UNIT TABS Take by mouth daily.    [provider]  cyclobenzaprine (FLEXERIL) 5 MG tablet Take one to two po qhs prn muscle spasm 02/09/19   Donna Post, MD  lisinopril-hydrochlorothiazide (ZESTORETIC) 20-25 MG tablet TAKE 1  TABLET EVERY DAY 02/24/20   Donna Ruddy, MD  metoprolol succinate (TOPROL-XL) 100 MG 24 hr tablet TAKE 1 TABLET DAILY. TAKE WITH OR IMMEDIATELY FOLLOWING A MEAL. 03/06/20   Donna Hector, MD  omega-3 fish oil (MAXEPA) 1000 MG CAPS capsule Take 1 capsule (1,000 mg total) by mouth 2 (two) times daily. Reported on 11/14/2015 03/24/16   Donna Everts, MD  potassium chloride SA (KLOR-CON) 20 MEQ tablet Take 2 tablets (40 mEq total) by mouth daily. 02/14/20   Donna Ruddy, MD  simvastatin (ZOCOR) 40 MG tablet TAKE 1 TABLET EVERY DAY 03/07/20   Donna Ruddy, MD    Inpatient Medications: Scheduled Meds:  aspirin  324 mg Oral Once   Continuous Infusions:   PRN Meds:   Allergies:    Allergies  Allergen Reactions   Bee Venom     Social History:   Social History   Socioeconomic History   Marital status: Single    Spouse name: Not on file   Number of children: Not on file   Years of education: 18   Highest education level: Not on file  Occupational History   Occupation: disabled    Comment: warehouse  Tobacco Use   Smoking status: Former Smoker    Quit date: 06/30/2006    Years since quitting: 13.8   Smokeless tobacco: Never Used  Scientific laboratory technician Use: Never assessed  Substance and Sexual Activity   Alcohol use: Yes    Comment: rare   Drug use: Yes    Types: Marijuana   Sexual activity: Not Currently  Other Topics Concern   Not on file  Social History Narrative   Masters in behavioral health   Live with room mate.   Grew up in [place] and lived here for since 2014.    Disabled with back pain.    Social Determinants of Health   Financial Resource Strain:    Difficulty of Paying Living Expenses: Not on file  Food Insecurity:    Worried About Charity fundraiser in the Last Year: Not on file   [employer] of Food in the Last Year: Not on file  Transportation Needs:    Lack of Transportation (Medical): Not on file   Lack of Transportation (Non-Medical): Not  on file  Physical Activity:    Days of Exercise per Week: Not on file   Minutes of Exercise per Session: Not on file  Stress:    Feeling of Stress : Not on file  Social Connections:    Frequency of Communication with Friends and Family: Not on file   Frequency of Social Gatherings with Friends and Family: Not on file   Attends Religious Services: Not on file   Active Member of Clubs or Organizations: Not on file   Attends Archivist Meetings: Not on file   Marital Status: Not on file  Intimate Partner Violence:    Fear of Current or Ex-Partner: Not on  file   Emotionally Abused: Not on file   Physically Abused: Not on file   Sexually Abused: Not on file    Family History:    Family History  Problem Relation Age of Onset   Hypertension Mother    Stroke Father    Heart disease Father    Dementia Father    Hypertension Sister    CAD Sister 38       cabg   Hypertension Sister    Cancer Sister 33       rectal   Hypertension Sister      ROS:  Please see the history of present illness.  All other ROS reviewed and negative.     Physical Exam/Data:   Vitals:   05/02/20 1601  BP: (!) 168/98  Pulse: 65  Resp: 16  Temp: 98.5 F (36.9 C)  TempSrc: Oral  SpO2: 97%   No intake or output data in the 24 hours ending 05/02/20 1746 Last 3 Weights 02/09/2020 10/20/2019 07/27/2019  Weight (lbs) 170 lb 176 lb 9.6 oz 176 lb 6.4 oz  Weight (kg) 77.111 kg 80.105 kg 80.015 kg     There is no height or weight on file to calculate BMI.  General: 64 y.o. female resting comfortably in no acute distress. HEENT: Normocephalic and atraumatic. Sclera clear. EOMs intact. Neck: Supple. Possible slight right carotid bruit. No JVD. Heart: RRR. Distinct S1 and S2. No murmurs, gallops, or rubs. Radial and distal pedal pulses 2+ and equal bilaterally. Lungs: No increased work of breathing. Clear to ausculation bilaterally. No wheezes, rhonchi, or rales.  Abdomen: Soft, non-distended, and  non-tender to palpation.  MSK: Normal strength and tone for age. Extremities: Mild lower extremity edema.    Skin: Warm and dry. Neuro: Alert and oriented x3. No focal deficits. Psych: Normal affect. Responds appropriately.   EKG:  The EKG was personally reviewed and demonstrates: Normal sinus rhytm, rate 67 bpm, with PAC, T wave inversion in lead III, ST depression in lead I and aVR, and mild ST elevation in aVR.  Telemetry:  Telemetry was personally reviewed and demonstrates:  Sinus rhythm with rates in the 50's to 70's. Occasional PAC/PVC.  Relevant CV Studies:  Left Heart Catheterization 06/05/2016: Severe calcific, diffuse three-vessel coronary disease. Codominant LAD/diagonal system with severe diffuse disease in each vessel with graftable distal targets. Eccentric 30-60% ostial left main depending upon review. 99% proximal to mid RCA. Total occlusion at the ostium of a large PDA that fills left-to-right collaterals. Moderate mid circumflex disease but without graftable targets beyond the stenosis. Normal left ventricular systolic function with normal hemodynamics   Recommendations: Continue current medical regimen. Expedient evaluation by TCTS for multivessel coronary bypass grafting. _______________  Holter Monitor 09/2016: - NSR - PAC's  - PVC's - No significant arrhythmias  Laboratory Data:  High Sensitivity Troponin:   Recent Labs  Lab 05/02/20 1630  TROPONINIHS 9     Chemistry Recent Labs  Lab 05/02/20 1630  NA 139  K 3.0*  CL 99  CO2 27  GLUCOSE 111*  BUN 15  CREATININE 1.03*  CALCIUM 9.6  GFRNONAA >60  ANIONGAP 13    No results for input(s): PROT, ALBUMIN, AST, ALT, ALKPHOS, BILITOT in the last 168 hours. Hematology Recent Labs  Lab 05/02/20 1630  WBC 11.3*  RBC 4.84  HGB 13.9  HCT 43.0  MCV 88.8  MCH 28.7  MCHC 32.3  RDW 14.6  PLT 199   BNPNo results for input(s): BNP, PROBNP in  the last 168 hours.  DDimer No results for input(s):  DDIMER in the last 168 hours.   Radiology/Studies:  DG Chest Port 1 View  Result Date: 05/02/2020 CLINICAL DATA:  Chest pain.  Near syncope today.  Dizziness. EXAM: PORTABLE CHEST 1 VIEW COMPARISON:  07/23/2016 FINDINGS: Woodward median sternotomy and CABG. Upper normal heart size with unchanged mediastinal contours. Aortic atherosclerosis. No pulmonary edema, focal airspace disease, pleural effusion or pneumothorax. No acute osseous abnormalities are seen. IMPRESSION: No acute chest findings.  Woodward CABG. Electronically Signed   By: Keith Rake M.D.   On: 05/02/2020 16:54     Assessment and Plan:   Near Syncope Palpitations - Patient presented with sudden onset of tunnel vision, lightheadedness/dizziness, headache, heart racing, and near syncope. BP 180's/90's at the time and HR 99. Symptoms lasted for about 20 minutes. Headache has persisted though. - Unclear etiology at this time.  - Will order head CT given persistent headache ane elevated BP.  - Will also check D-dimer to rule out PE. - Given palpitations and history of PSVT/NSVT would continue home Toprol-XL 100mg  daily.  - Will admit overnight for observation. Continue to monitor on telemetry. May need repeat Echo. Will defer to MD.  Abnormal EKG Atypical Chest Pain History of CAD s/p CABG in 2017 - EKG shows ST elevation in aVR, ST depression in aVL and I, and T wave inversion in III which are new.  - High-sensitivity troponin pending. - She reports "tightness" of left chest muscle during above episode but states this felt very different than prior anginal symptoms.  - Continue aspirin, beta-blocker, and statin. - Discussed with MD. Will make patient NPO at midnight and tentatively put her on for [facility] tomorrow. However, we may end up cancelling this depending on what remainder of labs show.  Hypertension - BP reported in the 180's/90's at home. Initial BP in the ED 168/98 but has improved. Most recent BP 130's/90's.   - Continue home Lisinopril-HCTZ 20-25mg  daily and Toprol-XL 100mg  daily. - May need to adjust antihypertensives.  Hyperlipidemia - LDL 84 in 09/2019.  - Will repeat fasting lipid panel in the morning. - Continue home Simvastatin for now.  HEAR Score (for undifferentiated chest pain):  HEAR Score: 5     For questions or updates, please contact [place] Please consult [email or web address] for contact info under    Signed, Darreld Mclean, PA-C  05/02/2020 5:46 PM  Patient examined chart reviewed. Most of her labs are not back yet. Exam benign with multiple tatoos no murmur Woodward sternotomy for CABG She has LE edema/leg pain no dyspnea and currently no chest pain Her telemetry is benign with NSR and no arrhythmias  Her only current complaint Is a bit of a headache. Her ECG does have some ST flattening and mild J point elevation in AVR but this is non specific and troponins are pending Would check head CT given persistent headache and elevated BP on admission Check d dimer only do CTA chest if elevated. Continue ACE/diuretic And beta blocker for BP Will consider myovue study in am pending results of troponin testing and repeat ECG in am   Donna Rouge MD [facility].

## 2020-05-02 NOTE — ED Provider Notes (Signed)
[facility] EMERGENCY DEPARTMENT Provider Note   CSN: [id number] Arrival date & time: 05/02/20  1510     History Chief Complaint  Patient presents with  . Near Syncope    Donna Woodward is a 64 y.o. female hx of CAD s/p CABG, depression, HL, HTN, here with you presenting with near syncope.  Patient states that around noontime she was putting the groceries away and had episode of dizziness.  She states that she had some palpitations as well.  Has some headaches as well.  Denies any chest pain or shortness of breath.  Patient came to the ED and in triage she was noted to have possible STEMI so was brought back to the main ED.  Patient adamantly denies any chest pain.  Patient states that she did have bypass surgery 4 years ago and sees Dr. Johnsie Cancel from cardiology.  The history is provided by the patient.    HPI: A 64 year old patient with a history of hypertension and hypercholesterolemia presents for evaluation of chest pain. Initial onset of pain was less than one hour ago. The patient's chest pain is described as heaviness/pressure/tightness and is not worse with exertion. The patient's chest pain is middle- or left-sided, is not well-localized, is not sharp and does not radiate to the arms/jaw/neck. The patient does not complain of nausea and denies diaphoresis. The patient has a family history of coronary artery disease in a first-degree relative with onset less than age 86. The patient has no history of stroke, has no history of peripheral artery disease, has not smoked in the past 90 days, denies any history of treated diabetes and does not have an elevated BMI (>=30).   Past Medical History:  Diagnosis Date  . Anxiety   . Back pain   . Coronary artery disease    CABGx3; Seen by Dr. Johnsie Cancel  . Degenerative joint disease (DJD) of lumbar spine 03/24/2016  . Depression   . GERD (gastroesophageal reflux disease)    if overweight  . Hyperlipidemia   . Hypertension      Patient Active Problem List   Diagnosis Date Noted  . Anxiety 11/06/2017  . S/P CABG x 3 06/17/2016  . Family history of early CAD 06/04/2016  . Chest pain 06/04/2016  . Abnormal nuclear stress test 06/04/2016  . Degenerative joint disease (DJD) of lumbar spine 03/24/2016  . Depression 03/24/2016  . Obesity 08/14/2015  . Essential hypertension, benign 04/29/2015  . Hyperlipidemia 04/29/2015    Past Surgical History:  Procedure Laterality Date  . BACK SURGERY    . CARDIAC CATHETERIZATION N/A 06/05/2016   Procedure: Left Heart Cath and Coronary Angiography;  Surgeon: Belva Crome, MD;  Location: [facility];  Service: Cardiovascular;  Laterality: N/A;  . CORONARY ARTERY BYPASS GRAFT N/A 06/17/2016   Procedure: CORONARY ARTERY BYPASS GRAFTING (CABG), ON PUMP, TIMES THREE, USING LEFT INTERNAL MAMMARY ARTERY, RIGHT GREATER SAPHENOUS VEIN HARVESTED ENDOSCOPICALLY;  Surgeon: Ivin Poot, MD;  Location: [facility];  Service: Open Heart Surgery;  Laterality: N/A;  . MULTIPLE TOOTH EXTRACTIONS    . [place]   lamnectomy L5S1  . TEE WITHOUT CARDIOVERSION N/A 06/17/2016   Procedure: TRANSESOPHAGEAL ECHOCARDIOGRAM (TEE);  Surgeon: Ivin Poot, MD;  Location: [facility];  Service: Open Heart Surgery;  Laterality: N/A;     OB History   No obstetric history on file.     Family History  Problem Relation Age of Onset  . Hypertension Mother   .  Stroke Father   . Heart disease Father   . Dementia Father   . Hypertension Sister   . CAD Sister 109       cabg  . Hypertension Sister   . Cancer Sister 55       rectal  . Hypertension Sister     Social History   Tobacco Use  . Smoking status: Former Smoker    Quit date: 06/30/2006    Years since quitting: 13.8  . Smokeless tobacco: Never Used  Vaping Use  . Vaping Use: Never assessed  Substance Use Topics  . Alcohol use: Yes    Comment: rare  . Drug use: Yes    Types: Marijuana    Home Medications Prior to  Admission medications   Medication Sig Start Date End Date Taking? Authorizing Provider  acetaminophen (TYLENOL) 325 MG tablet Take 325-650 mg by mouth every 6 (six) hours as needed for mild pain or headache.   Yes [provider]  aspirin EC 81 MG tablet Take 1 tablet (81 mg total) by mouth daily. 11/06/17  Yes Caren Macadam, MD  Calcium Carb-Cholecalciferol (CALCIUM + D3 PO) Take 1 tablet by mouth daily with breakfast.   Yes [provider]  cyclobenzaprine (FLEXERIL) 5 MG tablet Take one to two po qhs prn muscle spasm Patient taking differently: Take 5-10 mg by mouth at bedtime as needed for muscle spasms.  02/09/19  Yes Burchette, Alinda Sierras, MD  lisinopril-hydrochlorothiazide (ZESTORETIC) 20-25 MG tablet TAKE 1 TABLET EVERY DAY Patient taking differently: Take 1 tablet by mouth in the morning.  02/24/20  Yes Billie Ruddy, MD  metoprolol succinate (TOPROL-XL) 100 MG 24 hr tablet TAKE 1 TABLET DAILY. TAKE WITH OR IMMEDIATELY FOLLOWING A MEAL. Patient taking differently: Take 100 mg by mouth every evening. TAKE WITH OR IMMEDIATELY FOLLOWING A MEAL 03/06/20  Yes Josue Hector, MD  Omega-3 Fatty Acids (FISH OIL) 1000 MG CAPS Take 1,000 mg by mouth in the morning and at bedtime.    Yes [provider]  potassium chloride SA (KLOR-CON) 20 MEQ tablet Take 2 tablets (40 mEq total) by mouth daily. Patient taking differently: Take 40 mEq by mouth every evening.  02/14/20  Yes Billie Ruddy, MD  simvastatin (ZOCOR) 40 MG tablet TAKE 1 TABLET EVERY DAY Patient taking differently: Take 40 mg by mouth every evening.  03/07/20  Yes Billie Ruddy, MD  omega-3 fish oil (MAXEPA) 1000 MG CAPS capsule Take 1 capsule (1,000 mg total) by mouth 2 (two) times daily. Reported on 11/14/2015 Patient not taking: Reported on 05/02/2020 03/24/16   Raylene Everts, MD    Allergies    Bee venom  Review of Systems   Review of Systems  Cardiovascular: Positive for near-syncope.    Neurological: Positive for dizziness.  All other systems reviewed and are negative.   Physical Exam Updated Vital Signs BP (!) 168/98 (BP Location: Right Arm)   Pulse 65   Temp 98.5 F (36.9 C) (Oral)   Resp 16   SpO2 97%   Physical Exam Vitals and nursing note reviewed.  Constitutional:      Appearance: Normal appearance.  HENT:     Head: Normocephalic.     Nose: Nose normal.     Mouth/Throat:     Mouth: Mucous membranes are moist.  Eyes:     Extraocular Movements: Extraocular movements intact.     Pupils: Pupils are equal, round, and reactive to light.  Cardiovascular:  Rate and Rhythm: Normal rate and regular rhythm.     Pulses: Normal pulses.     Heart sounds: Normal heart sounds.  Pulmonary:     Effort: Pulmonary effort is normal.     Breath sounds: Normal breath sounds.  Abdominal:     General: Abdomen is flat.     Palpations: Abdomen is soft.  Musculoskeletal:        General: Normal range of motion.     Cervical back: Normal range of motion.  Skin:    General: Skin is warm.     Capillary Refill: Capillary refill takes less than 2 seconds.  Neurological:     General: No focal deficit present.     Mental Status: She is alert and oriented to person, place, and time.  Psychiatric:        Mood and Affect: Mood normal.        Behavior: Behavior normal.     ED Results / Procedures / Treatments   Labs (all labs ordered are listed, but only abnormal results are displayed) Labs Reviewed  BASIC METABOLIC PANEL - Abnormal; Notable for the following components:      Result Value   Potassium 3.0 (*)    Glucose, Bld 111 (*)    Creatinine, Ser 1.03 (*)    All other components within normal limits  CBC - Abnormal; Notable for the following components:   WBC 11.3 (*)    All other components within normal limits  URINALYSIS, ROUTINE W REFLEX MICROSCOPIC  D-DIMER, QUANTITATIVE (NOT AT [facility])  CBG MONITORING, ED  TROPONIN I (HIGH SENSITIVITY)  TROPONIN I (HIGH  SENSITIVITY)    EKG EKG Interpretation  Date/Time:  Wednesday May 02 2020 16:08:27 EDT Ventricular Rate:  66 PR Interval:  126 QRS Duration: 80 QT Interval:  422 QTC Calculation: 442 R Axis:   47 Text Interpretation: Normal sinus rhythm Marked ST abnormality, possible lateral subendocardial injury Abnormal ECG ST depression in 1 and ST elevation aVR new since previous Confirmed by Wandra Arthurs [phone]) on 05/02/2020 4:23:01 PM   Radiology CT HEAD WO CONTRAST  Result Date: 05/02/2020 CLINICAL DATA:  Dizziness, headache EXAM: CT HEAD WITHOUT CONTRAST TECHNIQUE: Contiguous axial images were obtained from the base of the skull through the vertex without intravenous contrast. COMPARISON:  None. FINDINGS: Brain: No acute infarct or hemorrhage. Lateral ventricles and midline structures are unremarkable. No acute extra-axial fluid collections. No mass effect. Vascular: No hyperdense vessel or unexpected calcification. Skull: Normal. Negative for fracture or focal lesion. Sinuses/Orbits: No acute finding. Other: None. IMPRESSION: 1. No acute intracranial process. Electronically Signed   By: Randa Ngo M.D.   On: 05/02/2020 18:33   DG Chest Port 1 View  Result Date: 05/02/2020 CLINICAL DATA:  Chest pain.  Near syncope today.  Dizziness. EXAM: PORTABLE CHEST 1 VIEW COMPARISON:  07/23/2016 FINDINGS: Post median sternotomy and CABG. Upper normal heart size with unchanged mediastinal contours. Aortic atherosclerosis. No pulmonary edema, focal airspace disease, pleural effusion or pneumothorax. No acute osseous abnormalities are seen. IMPRESSION: No acute chest findings.  Post CABG. Electronically Signed   By: Keith Rake M.D.   On: 05/02/2020 16:54    Procedures Procedures (including critical care time)  Medications Ordered in ED Medications  aspirin chewable tablet 324 mg (324 mg Oral Given 05/02/20 1849)    ED Course  I have reviewed the triage vital signs and the nursing  notes.  Pertinent labs & imaging results that were available during my care  of the patient were reviewed by me and considered in my medical decision making (see chart for details).    MDM Rules/Calculators/A&P HEAR Score: 5                       Donna Woodward is a 64 y.o. female here presenting with dizziness and palpitations.  Patient had no chest pain but EKG showed new elevations in aVR and depressions in 1 and lead III.  I discussed case with Dr. Angelena Form, STEMI doctor at 4:30 pm.  He does not recommend activating STEMI but states that cardiology will come and see the patient.  Plan to get CBC BMP and troponin and chest x-ray.  We will follow up on cardiology consult.  7:08 PM Labs show potassium 3.0 that was supplemented.  Cardiology saw patient and ordered nuclear medicine stress test.  They recommend admission to the hospital service for rule out ACS.  CT head ordered by cardiology and was unremarkable.   Final Clinical Impression(s) / ED Diagnoses Final diagnoses:  None    Rx / DC Orders ED Discharge Orders    None       Drenda Freeze, MD 05/02/20 1909

## 2020-05-02 NOTE — ED Notes (Signed)
Pt ambulatory to bathroom with steady gait, vss on ccm , side rails up, call bell in place.

## 2020-05-02 NOTE — ED Triage Notes (Signed)
Pt reports near syncopal episode today. Denies LOC, states she also felt like her heart was racing as well. Pt A&O x 4, ambulatory without difficulty.

## 2020-05-02 NOTE — H&P (Signed)
History and Physical    Donna Woodward:[id number] DOB: December 02, 1955 DOA: 05/02/2020  PCP: Billie Ruddy, MD  Patient coming from: Home.  Chief Complaint: Almost passed out.  HPI: Donna Woodward is a 64 y.o. female with history of CAD status post CABG, hypertension, history of SVT/NSVT, chronic back pain depression was putting of her groceries this afternoon when patient suddenly started developing palpitation dizziness tunnel vision and almost passed out.  At the same time patient also has some chest pressure.  Along with chest pressure patient also had bitemporal headache.  All symptoms lasted for almost an hour.  Patient took some Tylenol with no relief.  Patient was brought to the ER.  Did not have any loss of consciousness nausea vomiting diarrhea or any change in the medications.  ED Course: The ER patient blood pressure was slightly on the elevated side.  EKG showed some abnormal findings in the lead III with T wave inversion ST elevation in aVR and depressions in lead I.  Cardiology was consulted at this time admitted for further management including plan for stress test.  Since patient had a headache CT head was done which was unremarkable.  Labs were significant for negative troponins potassium of 3 WBC of 11.3 Covid test negative.  Chest x-ray did not show anything acute.  Review of Systems: As per HPI, rest all negative.   Past Medical History:  Diagnosis Date  . Anxiety   . Back pain   . Coronary artery disease    CABGx3; Seen by Dr. Johnsie Cancel  . Degenerative joint disease (DJD) of lumbar spine 03/24/2016  . Depression   . GERD (gastroesophageal reflux disease)    if overweight  . Hyperlipidemia   . Hypertension     Past Surgical History:  Procedure Laterality Date  . BACK SURGERY    . CARDIAC CATHETERIZATION N/A 06/05/2016   Procedure: Left Heart Cath and Coronary Angiography;  Surgeon: Belva Crome, MD;  Location: [facility];  Service: Cardiovascular;   Laterality: N/A;  . CORONARY ARTERY BYPASS GRAFT N/A 06/17/2016   Procedure: CORONARY ARTERY BYPASS GRAFTING (CABG), ON PUMP, TIMES THREE, USING LEFT INTERNAL MAMMARY ARTERY, RIGHT GREATER SAPHENOUS VEIN HARVESTED ENDOSCOPICALLY;  Surgeon: Ivin Poot, MD;  Location: [facility];  Service: Open Heart Surgery;  Laterality: N/A;  . MULTIPLE TOOTH EXTRACTIONS    . [place]   lamnectomy L5S1  . TEE WITHOUT CARDIOVERSION N/A 06/17/2016   Procedure: TRANSESOPHAGEAL ECHOCARDIOGRAM (TEE);  Surgeon: Ivin Poot, MD;  Location: [facility];  Service: Open Heart Surgery;  Laterality: N/A;     reports that she quit smoking about 13 years ago. She has never used smokeless tobacco. She reports current alcohol use. She reports current drug use. Drug: Marijuana.  Allergies  Allergen Reactions  . Bee Venom Swelling and Other (See Comments)    Swells where stung- no breathing issues    Family History  Problem Relation Age of Onset  . Hypertension Mother   . Stroke Father   . Heart disease Father   . Dementia Father   . Hypertension Sister   . CAD Sister 89       cabg  . Hypertension Sister   . Cancer Sister 37       rectal  . Hypertension Sister     Prior to Admission medications   Medication Sig Start Date End Date Taking? Authorizing Provider  acetaminophen (TYLENOL) 325 MG tablet Take 325-650 mg  by mouth every 6 (six) hours as needed for mild pain or headache.   Yes [provider]  aspirin EC 81 MG tablet Take 1 tablet (81 mg total) by mouth daily. 11/06/17  Yes Caren Macadam, MD  Calcium Carb-Cholecalciferol (CALCIUM + D3 PO) Take 1 tablet by mouth daily with breakfast.   Yes [provider]  cyclobenzaprine (FLEXERIL) 5 MG tablet Take one to two po qhs prn muscle spasm Patient taking differently: Take 5-10 mg by mouth at bedtime as needed for muscle spasms.  02/09/19  Yes Burchette, Alinda Sierras, MD  lisinopril-hydrochlorothiazide (ZESTORETIC) 20-25 MG tablet TAKE 1  TABLET EVERY DAY Patient taking differently: Take 1 tablet by mouth in the morning.  02/24/20  Yes Billie Ruddy, MD  metoprolol succinate (TOPROL-XL) 100 MG 24 hr tablet TAKE 1 TABLET DAILY. TAKE WITH OR IMMEDIATELY FOLLOWING A MEAL. Patient taking differently: Take 100 mg by mouth every evening. TAKE WITH OR IMMEDIATELY FOLLOWING A MEAL 03/06/20  Yes Josue Hector, MD  Omega-3 Fatty Acids (FISH OIL) 1000 MG CAPS Take 1,000 mg by mouth in the morning and at bedtime.    Yes [provider]  potassium chloride SA (KLOR-CON) 20 MEQ tablet Take 2 tablets (40 mEq total) by mouth daily. Patient taking differently: Take 40 mEq by mouth every evening.  02/14/20  Yes Billie Ruddy, MD  simvastatin (ZOCOR) 40 MG tablet TAKE 1 TABLET EVERY DAY Patient taking differently: Take 40 mg by mouth every evening.  03/07/20  Yes Billie Ruddy, MD  omega-3 fish oil (MAXEPA) 1000 MG CAPS capsule Take 1 capsule (1,000 mg total) by mouth 2 (two) times daily. Reported on 11/14/2015 Patient not taking: Reported on 05/02/2020 03/24/16   Raylene Everts, MD    Physical Exam: Constitutional: Moderately built and nourished. Vitals:   05/02/20 1845 05/02/20 1905 05/02/20 2029 05/02/20 2059  BP: (!) 186/104  (!) 189/121 (!) 183/85  Pulse: 61  62 63  Resp: 16 17 17 18   Temp:  98 F (36.7 C) 98 F (36.7 C)   TempSrc:  Oral Oral   SpO2: 97%  98% 97%  Weight:    77.1 kg  Height:    5\' 4"  (1.626 m)   Eyes: Anicteric no pallor. ENMT: No discharge from the ears eyes nose or mouth. Neck: No mass felt.  No neck rigidity. Respiratory: No rhonchi or crepitations. Cardiovascular: S1-S2 heard. Abdomen: Soft nontender bowel sounds present. Musculoskeletal: No edema. Skin: No rash. Neurologic: Alert awake oriented to time place and person.  Moves all extremities 5 x 5.  No facial asymmetry tongue is midline. Psychiatric: Appears normal.  Normal affect.   Labs on Admission: I have personally reviewed  following labs and imaging studies  CBC: Recent Labs  Lab 05/02/20 1630  WBC 11.3*  HGB 13.9  HCT 43.0  MCV 88.8  PLT 782   Basic Metabolic Panel: Recent Labs  Lab 05/02/20 1630  NA 139  K 3.0*  CL 99  CO2 27  GLUCOSE 111*  BUN 15  CREATININE 1.03*  CALCIUM 9.6   GFR: Estimated Creatinine Clearance: 55.5 mL/min (A) (by C-G formula based on SCr of 1.03 mg/dL (H)). Liver Function Tests: No results for input(s): AST, ALT, ALKPHOS, BILITOT, PROT, ALBUMIN in the last 168 hours. No results for input(s): LIPASE, AMYLASE in the last 168 hours. No results for input(s): AMMONIA in the last 168 hours. Coagulation Profile: No results for input(s): INR, PROTIME in the last 168  hours. Cardiac Enzymes: No results for input(s): CKTOTAL, CKMB, CKMBINDEX, TROPONINI in the last 168 hours. BNP (last 3 results) No results for input(s): PROBNP in the last 8760 hours. HbA1C: No results for input(s): HGBA1C in the last 72 hours. CBG: Recent Labs  Lab 05/02/20 1839  GLUCAP 94   Lipid Profile: No results for input(s): CHOL, HDL, LDLCALC, TRIG, CHOLHDL, LDLDIRECT in the last 72 hours. Thyroid Function Tests: No results for input(s): TSH, T4TOTAL, FREET4, T3FREE, THYROIDAB in the last 72 hours. Anemia Panel: No results for input(s): VITAMINB12, FOLATE, FERRITIN, TIBC, IRON, RETICCTPCT in the last 72 hours. Urine analysis:    Component Value Date/Time   COLORURINE STRAW (A) 05/02/2020 1848   APPEARANCEUR CLEAR 05/02/2020 1848   LABSPEC 1.010 05/02/2020 1848   PHURINE 5.0 05/02/2020 1848   GLUCOSEU NEGATIVE 05/02/2020 1848   HGBUR SMALL (A) 05/02/2020 1848   BILIRUBINUR NEGATIVE 05/02/2020 1848   KETONESUR NEGATIVE 05/02/2020 1848   PROTEINUR NEGATIVE 05/02/2020 1848   NITRITE NEGATIVE 05/02/2020 1848   LEUKOCYTESUR TRACE (A) 05/02/2020 1848   Sepsis Labs: @LABRCNTIP (procalcitonin:4,lacticidven:4) )No results found for this or any previous visit (from the past 240 hour(s)).    Radiological Exams on Admission: CT HEAD WO CONTRAST  Result Date: 05/02/2020 CLINICAL DATA:  Dizziness, headache EXAM: CT HEAD WITHOUT CONTRAST TECHNIQUE: Contiguous axial images were obtained from the base of the skull through the vertex without intravenous contrast. COMPARISON:  None. FINDINGS: Brain: No acute infarct or hemorrhage. Lateral ventricles and midline structures are unremarkable. No acute extra-axial fluid collections. No mass effect. Vascular: No hyperdense vessel or unexpected calcification. Skull: Normal. Negative for fracture or focal lesion. Sinuses/Orbits: No acute finding. Other: None. IMPRESSION: 1. No acute intracranial process. Electronically Signed   By: Randa Ngo M.D.   On: 05/02/2020 18:33   DG Chest Port 1 View  Result Date: 05/02/2020 CLINICAL DATA:  Chest pain.  Near syncope today.  Dizziness. EXAM: PORTABLE CHEST 1 VIEW COMPARISON:  07/23/2016 FINDINGS: Post median sternotomy and CABG. Upper normal heart size with unchanged mediastinal contours. Aortic atherosclerosis. No pulmonary edema, focal airspace disease, pleural effusion or pneumothorax. No acute osseous abnormalities are seen. IMPRESSION: No acute chest findings.  Post CABG. Electronically Signed   By: Keith Rake M.D.   On: 05/02/2020 16:54    EKG: Independently reviewed.  Normal sinus rhythm with abnormal T wave changes in the lead 3 and ST elevation in aVR with ST depression in lead I.  Assessment/Plan Principal Problem:   Near syncope Active Problems:   Essential hypertension, benign   Chest pain   S/P CABG x 3    1. Near syncope and palpitation cause not clear but cardiology at this time planning to monitor in telemetry and continue with Toprol. 2. Chest pain with history of CAD and abnormal EKG planning to have stress test done tomorrow morning.  We will continue with aspirin statins and beta-blockers. 3. Uncontrolled hypertension presently on hydrochlorothiazide lisinopril  metoprolol and I have added as needed IV hydralazine.  Follow blood pressure trends. 4. Headache bitemporal may be from uncontrolled hypertension.  For which I have added as needed IV hydralazine along with patient blood pressure medications.  Check sed rate to make sure there is no inflammatory process.  CT head was unremarkable. 5. Mild hypokalemia could be from hydrochlorothiazide replace and recheck.  Check magnesium with next blood draw.   DVT prophylaxis: Lovenox. Code Status: Full code. Family Communication: Discussed with patient. Disposition Plan: Home. Consults called: Cardiology.  Admission status: Observation.   Rise Patience MD [facility] Pager [phone].  If 7PM-7AM, please contact night-coverage [email or web address] Password [facility]  05/02/2020, 9:26 PM

## 2020-05-03 ENCOUNTER — Observation Stay (HOSPITAL_BASED_OUTPATIENT_CLINIC_OR_DEPARTMENT_OTHER): Payer: Medicare HMO

## 2020-05-03 DIAGNOSIS — R55 Syncope and collapse: Secondary | ICD-10-CM | POA: Diagnosis not present

## 2020-05-03 DIAGNOSIS — Z951 Presence of aortocoronary bypass graft: Secondary | ICD-10-CM

## 2020-05-03 DIAGNOSIS — R079 Chest pain, unspecified: Secondary | ICD-10-CM

## 2020-05-03 DIAGNOSIS — R519 Headache, unspecified: Secondary | ICD-10-CM | POA: Diagnosis not present

## 2020-05-03 LAB — NM MYOCAR MULTI W/SPECT W/WALL MOTION / EF
LV dias vol: 65 mL (ref 46–106)
MPHR: 156 {beats}/min
Peak HR: 90 {beats}/min
Percent HR: 57 %
Rest HR: 67 {beats}/min
TID: 1.16

## 2020-05-03 LAB — COMPREHENSIVE METABOLIC PANEL
ALT: 18 U/L (ref 0–44)
AST: 15 U/L (ref 15–41)
Albumin: 3.6 g/dL (ref 3.5–5.0)
Alkaline Phosphatase: 51 U/L (ref 38–126)
Anion gap: 9 (ref 5–15)
BUN: 11 mg/dL (ref 8–23)
CO2: 27 mmol/L (ref 22–32)
Calcium: 9.3 mg/dL (ref 8.9–10.3)
Chloride: 106 mmol/L (ref 98–111)
Creatinine, Ser: 0.72 mg/dL (ref 0.44–1.00)
GFR, Estimated: >60 mL/min (ref >60)
Glucose, Bld: 111 mg/dL — ABNORMAL HIGH (ref 70–99)
Potassium: 3.1 mmol/L — ABNORMAL LOW (ref 3.5–5.1)
Sodium: 142 mmol/L (ref 135–145)
Total Bilirubin: 0.6 mg/dL (ref 0.3–1.2)
Total Protein: 6.3 g/dL — ABNORMAL LOW (ref 6.5–8.1)

## 2020-05-03 LAB — CBC
HCT: 42.1 % (ref 36.0–46.0)
Hemoglobin: 13.8 g/dL (ref 12.0–15.0)
MCH: 28.7 pg (ref 26.0–34.0)
MCHC: 32.8 g/dL (ref 30.0–36.0)
MCV: 87.5 fL (ref 80.0–100.0)
Platelets: 195 10*3/uL (ref 150–400)
RBC: 4.81 MIL/uL (ref 3.87–5.11)
RDW: 14.7 % (ref 11.5–15.5)
WBC: 8.2 10*3/uL (ref 4.0–10.5)
nRBC: 0 % (ref 0.0–0.2)

## 2020-05-03 LAB — SEDIMENTATION RATE: Sed Rate: 19 mm/hr (ref 0–22)

## 2020-05-03 LAB — HIV ANTIBODY (ROUTINE TESTING W REFLEX): HIV Screen 4th Generation wRfx: NONREACTIVE

## 2020-05-03 MED ORDER — REGADENOSON 0.4 MG/5ML IV SOLN
INTRAVENOUS | Status: AC
  Filled 2020-05-03: qty 5

## 2020-05-03 MED ORDER — POTASSIUM CHLORIDE CRYS ER 20 MEQ PO TBCR
20.0000 meq | EXTENDED_RELEASE_TABLET | Freq: Once | ORAL | Status: AC
  Administered 2020-05-03: 20 meq via ORAL
  Filled 2020-05-03: qty 1

## 2020-05-03 MED ORDER — TECHNETIUM TC 99M TETROFOSMIN IV KIT
10.8000 | PACK | Freq: Once | INTRAVENOUS | Status: AC | PRN
  Administered 2020-05-03: 10.8 via INTRAVENOUS

## 2020-05-03 MED ORDER — REGADENOSON 0.4 MG/5ML IV SOLN
0.4000 mg | Freq: Once | INTRAVENOUS | Status: AC
  Administered 2020-05-03: 0.4 mg via INTRAVENOUS
  Filled 2020-05-03: qty 5

## 2020-05-03 MED ORDER — TECHNETIUM TC 99M TETROFOSMIN IV KIT
31.0000 | PACK | Freq: Once | INTRAVENOUS | Status: AC | PRN
  Administered 2020-05-03: 31 via INTRAVENOUS

## 2020-05-03 NOTE — Progress Notes (Signed)
   Donna Woodward presented for a lexiscan cardiolite today.  No immediate complications.  Stress imaging is pending at this time.  Reino Bellis, NP 05/03/2020, 9:52 AM

## 2020-05-03 NOTE — Progress Notes (Signed)
Order received to discharge patient.  Telemetry monitor removed and CCMD notified.  PIV access removed.  Discharge instructions, follow up, medications and instructions for their use discussed with patient. 

## 2020-05-03 NOTE — Discharge Summary (Signed)
Physician Discharge Summary  Donna Woodward:[id number] DOB: October 21, 1955 DOA: 05/02/2020  PCP: Billie Ruddy, MD  Admit date: 05/02/2020 Discharge date: 05/03/2020  Admitted From: Home Disposition: Home  Recommendations for Outpatient Follow-up:  1. Follow up with PCP in 1-2 weeks 2. Follow-up with cardiology as scheduled  Home Health: None Equipment/Devices: None  Discharge Condition: Stable CODE STATUS: Full Diet recommendation: Low-salt cardiac diet  Brief/Interim Summary:  Donna Woodward is a 64 y.o. female with history of CAD status post CABG, hypertension, history of SVT/NSVT, chronic back pain depression was putting of her groceries this afternoon when patient suddenly started developing palpitation dizziness tunnel vision and almost passed out.  At the same time patient also has some chest pressure.  Along with chest pressure patient also had bitemporal headache.  All symptoms lasted for almost an hour.  Patient took some Tylenol with no relief.  Patient was brought to the ER.  Did not have any loss of consciousness nausea vomiting diarrhea or any change in the medications.  In the ED patient blood pressure was slightly on the elevated side.  EKG showed some abnormal findings in the lead III with T wave inversion ST elevation in aVR and depressions in lead I.  Cardiology was consulted at this time admitted for further management including plan for stress test.  Since patient had a headache CT head was done which was unremarkable.  Labs were significant for negative troponins potassium of 3 WBC of 11.3 Covid test negative.  Chest x-ray did not show anything acute.  Patient admitted as above, cardiology consulting, underwent stress test today which was unremarkable.  Symptoms resolved, otherwise now stable and agreeable for discharge home.  Discharge Diagnoses:  Principal Problem:   Near syncope Active Problems:   Essential hypertension, benign   Chest pain   S/P CABG x  3    Discharge Instructions  Discharge Instructions    Diet - low sodium heart healthy   Complete by: As directed    Increase activity slowly   Complete by: As directed      Allergies as of 05/03/2020      Reactions   Bee Venom Swelling, Other (See Comments)   Swells where stung- no breathing issues      Medication List    TAKE these medications   acetaminophen 325 MG tablet Commonly known as: TYLENOL Take 325-650 mg by mouth every 6 (six) hours as needed for mild pain or headache.   aspirin EC 81 MG tablet Take 1 tablet (81 mg total) by mouth daily.   CALCIUM + D3 PO Take 1 tablet by mouth daily with breakfast.   cyclobenzaprine 5 MG tablet Commonly known as: FLEXERIL Take one to two po qhs prn muscle spasm What changed:   how much to take  how to take this  when to take this  reasons to take this  additional instructions   Fish Oil 1000 MG Caps Take 1,000 mg by mouth in the morning and at bedtime.   lisinopril-hydrochlorothiazide 20-25 MG tablet Commonly known as: ZESTORETIC TAKE 1 TABLET EVERY DAY What changed: when to take this   metoprolol succinate 100 MG 24 hr tablet Commonly known as: TOPROL-XL TAKE 1 TABLET DAILY. TAKE WITH OR IMMEDIATELY FOLLOWING A MEAL. What changed: See the new instructions.   potassium chloride SA 20 MEQ tablet Commonly known as: KLOR-CON Take 2 tablets (40 mEq total) by mouth daily. What changed: when to take this   simvastatin 40 MG  tablet Commonly known as: ZOCOR TAKE 1 TABLET EVERY DAY What changed: when to take this       Allergies  Allergen Reactions  . Bee Venom Swelling and Other (See Comments)    Swells where stung- no breathing issues    Consultations:  Cardiology   Procedures/Studies: CT HEAD WO CONTRAST  Result Date: 05/02/2020 CLINICAL DATA:  Dizziness, headache EXAM: CT HEAD WITHOUT CONTRAST TECHNIQUE: Contiguous axial images were obtained from the base of the skull through the vertex  without intravenous contrast. COMPARISON:  None. FINDINGS: Brain: No acute infarct or hemorrhage. Lateral ventricles and midline structures are unremarkable. No acute extra-axial fluid collections. No mass effect. Vascular: No hyperdense vessel or unexpected calcification. Skull: Normal. Negative for fracture or focal lesion. Sinuses/Orbits: No acute finding. Other: None. IMPRESSION: 1. No acute intracranial process. Electronically Signed   By: Randa Ngo M.D.   On: 05/02/2020 18:33   DG Chest Port 1 View  Result Date: 05/02/2020 CLINICAL DATA:  Chest pain.  Near syncope today.  Dizziness. EXAM: PORTABLE CHEST 1 VIEW COMPARISON:  07/23/2016 FINDINGS: Post median sternotomy and CABG. Upper normal heart size with unchanged mediastinal contours. Aortic atherosclerosis. No pulmonary edema, focal airspace disease, pleural effusion or pneumothorax. No acute osseous abnormalities are seen. IMPRESSION: No acute chest findings.  Post CABG. Electronically Signed   By: Keith Rake M.D.   On: 05/02/2020 16:54     Subjective: No acute issues or events overnight feels quite well denies nausea, vomiting, diarrhea, constipation, headache, fevers, chills.   Discharge Exam: Vitals:   05/03/20 1109 05/03/20 1347  BP: (!) 172/103 (!) 157/85  Pulse:  75  Resp:  18  Temp:  97.8 F (36.6 C)  SpO2:  98%   Vitals:   05/03/20 0944 05/03/20 1052 05/03/20 1109 05/03/20 1347  BP: (!) 177/94 (!) 192/87 (!) 172/103 (!) 157/85  Pulse:  68  75  Resp:  17  18  Temp:  97.8 F (36.6 C)  97.8 F (36.6 C)  TempSrc:  Oral  Oral  SpO2:  96%  98%  Weight:      Height:        General: Pt is alert, awake, not in acute distress Cardiovascular: RRR, S1/S2 +, no rubs, no gallops Respiratory: CTA bilaterally, no wheezing, no rhonchi Abdominal: Soft, NT, ND, bowel sounds + Extremities: no edema, no cyanosis    The results of significant diagnostics from this hospitalization (including imaging, microbiology,  ancillary and laboratory) are listed below for reference.     Microbiology: Recent Results (from the past 240 hour(s))  Respiratory Panel by RT PCR (Flu A&B, Covid) - Nasopharyngeal Swab     Status: None   Collection Time: 05/02/20  8:57 PM   Specimen: Nasopharyngeal Swab  Result Value Ref Range Status   SARS Coronavirus 2 by RT PCR NEGATIVE NEGATIVE Final    Comment: (NOTE) SARS-CoV-2 target nucleic acids are NOT DETECTED.  The SARS-CoV-2 RNA is generally detectable in upper respiratoy specimens during the acute phase of infection. The lowest concentration of SARS-CoV-2 viral copies this assay can detect is 131 copies/mL. A negative result does not preclude SARS-Cov-2 infection and should not be used as the sole basis for treatment or other patient management decisions. A negative result may occur with  improper specimen collection/handling, submission of specimen other than nasopharyngeal swab, presence of viral mutation(s) within the areas targeted by this assay, and inadequate number of viral copies (<131 copies/mL). A negative result must be combined  with clinical observations, patient history, and epidemiological information. The expected result is Negative.  Fact Sheet for Patients:  PinkCheek.be  Fact Sheet for Healthcare Providers:  GravelBags.it  This test is no t yet approved or cleared by the [place] FDA and  has been authorized for detection and/or diagnosis of SARS-CoV-2 by FDA under an Emergency Use Authorization (EUA). This EUA will remain  in effect (meaning this test can be used) for the duration of the COVID-19 declaration under Section 564(b)(1) of the Act, 21 U.S.C. section 360bbb-3(b)(1), unless the authorization is terminated or revoked sooner.     Influenza A by PCR NEGATIVE NEGATIVE Final   Influenza B by PCR NEGATIVE NEGATIVE Final    Comment: (NOTE) The Xpert Xpress SARS-CoV-2/FLU/RSV  assay is intended as an aid in  the diagnosis of influenza from Nasopharyngeal swab specimens and  should not be used as a sole basis for treatment. Nasal washings and  aspirates are unacceptable for Xpert Xpress SARS-CoV-2/FLU/RSV  testing.  Fact Sheet for Patients: PinkCheek.be  Fact Sheet for Healthcare Providers: GravelBags.it  This test is not yet approved or cleared by the [place] FDA and  has been authorized for detection and/or diagnosis of SARS-CoV-2 by  FDA under an Emergency Use Authorization (EUA). This EUA will remain  in effect (meaning this test can be used) for the duration of the  Covid-19 declaration under Section 564(b)(1) of the Act, 21  U.S.C. section 360bbb-3(b)(1), unless the authorization is  terminated or revoked. Performed at [facility], [place] [address] [place], [place] [address]      Labs: BNP (last 3 results) No results for input(s): BNP in the last 8760 hours. Basic Metabolic Panel: Recent Labs  Lab 05/02/20 1630 05/03/20 0102  NA 139 142  K 3.0* 3.1*  CL 99 106  CO2 27 27  GLUCOSE 111* 111*  BUN 15 11  CREATININE 1.03* 0.72  CALCIUM 9.6 9.3   Liver Function Tests: Recent Labs  Lab 05/03/20 0102  AST 15  ALT 18  ALKPHOS 51  BILITOT 0.6  PROT 6.3*  ALBUMIN 3.6   No results for input(s): LIPASE, AMYLASE in the last 168 hours. No results for input(s): AMMONIA in the last 168 hours. CBC: Recent Labs  Lab 05/02/20 1630 05/03/20 0102  WBC 11.3* 8.2  HGB 13.9 13.8  HCT 43.0 42.1  MCV 88.8 87.5  PLT 199 195   Cardiac Enzymes: No results for input(s): CKTOTAL, CKMB, CKMBINDEX, TROPONINI in the last 168 hours. BNP: Invalid input(s): POCBNP CBG: Recent Labs  Lab 05/02/20 1839  GLUCAP 94   D-Dimer Recent Labs    05/02/20 1922  DDIMER 0.49   Hgb A1c No results for input(s): HGBA1C in the last 72 hours. Lipid Profile No results for input(s): CHOL,  HDL, LDLCALC, TRIG, CHOLHDL, LDLDIRECT in the last 72 hours. Thyroid function studies No results for input(s): TSH, T4TOTAL, T3FREE, THYROIDAB in the last 72 hours.  Invalid input(s): FREET3 Anemia work up No results for input(s): VITAMINB12, FOLATE, FERRITIN, TIBC, IRON, RETICCTPCT in the last 72 hours. Urinalysis    Component Value Date/Time   COLORURINE STRAW (A) 05/02/2020 1848   APPEARANCEUR CLEAR 05/02/2020 1848   LABSPEC 1.010 05/02/2020 1848   PHURINE 5.0 05/02/2020 1848   GLUCOSEU NEGATIVE 05/02/2020 1848   HGBUR SMALL (A) 05/02/2020 1848   BILIRUBINUR NEGATIVE 05/02/2020 [place] NEGATIVE 05/02/2020 1848   PROTEINUR NEGATIVE 05/02/2020 1848   NITRITE NEGATIVE 05/02/2020 1848  LEUKOCYTESUR TRACE (A) 05/02/2020 1848   Sepsis Labs Invalid input(s): PROCALCITONIN,  WBC,  LACTICIDVEN Microbiology Recent Results (from the past 240 hour(s))  Respiratory Panel by RT PCR (Flu A&B, Covid) - Nasopharyngeal Swab     Status: None   Collection Time: 05/02/20  8:57 PM   Specimen: Nasopharyngeal Swab  Result Value Ref Range Status   SARS Coronavirus 2 by RT PCR NEGATIVE NEGATIVE Final    Comment: (NOTE) SARS-CoV-2 target nucleic acids are NOT DETECTED.  The SARS-CoV-2 RNA is generally detectable in upper respiratoy specimens during the acute phase of infection. The lowest concentration of SARS-CoV-2 viral copies this assay can detect is 131 copies/mL. A negative result does not preclude SARS-Cov-2 infection and should not be used as the sole basis for treatment or other patient management decisions. A negative result may occur with  improper specimen collection/handling, submission of specimen other than nasopharyngeal swab, presence of viral mutation(s) within the areas targeted by this assay, and inadequate number of viral copies (<131 copies/mL). A negative result must be combined with clinical observations, patient history, and epidemiological information.  The expected result is Negative.  Fact Sheet for Patients:  PinkCheek.be  Fact Sheet for Healthcare Providers:  GravelBags.it  This test is no t yet approved or cleared by the [place] FDA and  has been authorized for detection and/or diagnosis of SARS-CoV-2 by FDA under an Emergency Use Authorization (EUA). This EUA will remain  in effect (meaning this test can be used) for the duration of the COVID-19 declaration under Section 564(b)(1) of the Act, 21 U.S.C. section 360bbb-3(b)(1), unless the authorization is terminated or revoked sooner.     Influenza A by PCR NEGATIVE NEGATIVE Final   Influenza B by PCR NEGATIVE NEGATIVE Final    Comment: (NOTE) The Xpert Xpress SARS-CoV-2/FLU/RSV assay is intended as an aid in  the diagnosis of influenza from Nasopharyngeal swab specimens and  should not be used as a sole basis for treatment. Nasal washings and  aspirates are unacceptable for Xpert Xpress SARS-CoV-2/FLU/RSV  testing.  Fact Sheet for Patients: PinkCheek.be  Fact Sheet for Healthcare Providers: GravelBags.it  This test is not yet approved or cleared by the [place] FDA and  has been authorized for detection and/or diagnosis of SARS-CoV-2 by  FDA under an Emergency Use Authorization (EUA). This EUA will remain  in effect (meaning this test can be used) for the duration of the  Covid-19 declaration under Section 564(b)(1) of the Act, 21  U.S.C. section 360bbb-3(b)(1), unless the authorization is  terminated or revoked. Performed at [facility], [place] [address] [place], [place] [address]      Time coordinating discharge: Over 30 minutes  SIGNED:   Little Ishikawa, DO [facility] 05/03/2020, 2:09 PM Pager   If 7PM-7AM, please contact night-coverage [email or web address]

## 2020-05-03 NOTE — Progress Notes (Signed)
Subjective:  Denies SSCP, palpitations or Dyspnea   Objective:  Vitals:   05/03/20 0104 05/03/20 0402 05/03/20 0517 05/03/20 0604  BP: (!) 155/79 (!) 159/77 (!) 174/84 128/63  Pulse: (!) 57 (!) 55 68 61  Resp: 15 (!) 21 (!) 23 18  Temp:  98.2 F (36.8 C)    TempSrc:  Oral    SpO2:  96% 98% 100%  Weight:      Height:        Intake/Output from previous day:  Intake/Output Summary (Last 24 hours) at 05/03/2020 0736 Last data filed at 05/02/2020 2330 Gross per 24 hour  Intake 236 ml  Output --  Net 236 ml    Physical Exam: Affect appropriate Healthy:  appears stated age HEENT: normal Neck supple with no adenopathy JVP normal no bruits no thyromegaly Lungs clear with no wheezing and good diaphragmatic motion Heart:  S1/S2 no murmur, no rub, gallop or click PMI normal post sternotomy  Abdomen: benighn, BS positve, no tenderness, no AAA no bruit.  No HSM or HJR Distal pulses intact with no bruits No edema Neuro non-focal Skin warm and dry multiple tatoos  No muscular weakness   Lab Results: Basic Metabolic Panel: Recent Labs    05/02/20 1630 05/03/20 0102  NA 139 142  K 3.0* 3.1*  CL 99 106  CO2 27 27  GLUCOSE 111* 111*  BUN 15 11  CREATININE 1.03* 0.72  CALCIUM 9.6 9.3   Liver Function Tests: Recent Labs    05/03/20 0102  AST 15  ALT 18  ALKPHOS 51  BILITOT 0.6  PROT 6.3*  ALBUMIN 3.6   No results for input(s): LIPASE, AMYLASE in the last 72 hours. CBC: Recent Labs    05/02/20 1630 05/03/20 0102  WBC 11.3* 8.2  HGB 13.9 13.8  HCT 43.0 42.1  MCV 88.8 87.5  PLT 199 195   Cardiac Enzymes: No results for input(s): CKTOTAL, CKMB, CKMBINDEX, TROPONINI in the last 72 hours. BNP: Invalid input(s): POCBNP D-Dimer: Recent Labs    05/02/20 1922  DDIMER 0.49   Hemoglobin A1C: No results for input(s): HGBA1C in the last 72 hours. Fasting Lipid Panel: No results for input(s): CHOL, HDL, LDLCALC, TRIG, CHOLHDL, LDLDIRECT in the last 72  hours. Thyroid Function Tests: No results for input(s): TSH, T4TOTAL, T3FREE, THYROIDAB in the last 72 hours.  Invalid input(s): FREET3 Anemia Panel: No results for input(s): VITAMINB12, FOLATE, FERRITIN, TIBC, IRON, RETICCTPCT in the last 72 hours.  Imaging: CT HEAD WO CONTRAST  Result Date: 05/02/2020 CLINICAL DATA:  Dizziness, headache EXAM: CT HEAD WITHOUT CONTRAST TECHNIQUE: Contiguous axial images were obtained from the base of the skull through the vertex without intravenous contrast. COMPARISON:  None. FINDINGS: Brain: No acute infarct or hemorrhage. Lateral ventricles and midline structures are unremarkable. No acute extra-axial fluid collections. No mass effect. Vascular: No hyperdense vessel or unexpected calcification. Skull: Normal. Negative for fracture or focal lesion. Sinuses/Orbits: No acute finding. Other: None. IMPRESSION: 1. No acute intracranial process. Electronically Signed   By: Randa Ngo M.D.   On: 05/02/2020 18:33   DG Chest Port 1 View  Result Date: 05/02/2020 CLINICAL DATA:  Chest pain.  Near syncope today.  Dizziness. EXAM: PORTABLE CHEST 1 VIEW COMPARISON:  07/23/2016 FINDINGS: Post median sternotomy and CABG. Upper normal heart size with unchanged mediastinal contours. Aortic atherosclerosis. No pulmonary edema, focal airspace disease, pleural effusion or pneumothorax. No acute osseous abnormalities are seen. IMPRESSION: No acute chest findings.  Post CABG.  Electronically Signed   By: Keith Rake M.D.   On: 05/02/2020 16:54    Cardiac Studies:  ECG: NSR rare PVC;s no acute changes    Telemetry: NSR   Echo:   Medications:   . aspirin EC  81 mg Oral Daily  . enoxaparin (LOVENOX) injection  40 mg Subcutaneous Q24H  . hydrochlorothiazide  25 mg Oral q AM  . influenza vac split quadrivalent PF  0.5 mL Intramuscular Tomorrow-1000  . lisinopril  20 mg Oral Daily  . metoprolol succinate  100 mg Oral QPM  . omega-3 acid ethyl esters  1 g Oral Daily  .  potassium chloride SA  40 mEq Oral QPM  . simvastatin  40 mg Oral QPM      Assessment/Plan:   1. Chest Pain:  Atypical muscular sounding Post CABG 2017 R/O no acute ECG changes for myovue today   2. Pre syncope:  Doubt cardiac etiology Rhythm stable on telemetry D dimer negative  3. HTN:  Improved continue beta blocker and ACE along with diuretic  4. HLD:  On statin   5. Headache:  Lingering CT head negative   Jenkins Rouge 05/03/2020, 7:36 AM

## 2020-05-05 NOTE — Progress Notes (Signed)
CARDIOLOGY OFFICE NOTE  Date:  05/08/2020    Donna Woodward Date of Birth: Nov 07, 1955 Medical Record #[id number]  PCP:  Billie Ruddy, MD  Cardiologist:  Johnsie Cancel   Chief Complaint  Patient presents with  . Hospitalization Follow-up    History of Present Illness: Donna Woodward is a 64 y.o. female who presents today for a post hospital visit. Seen for Dr. Johnsie Cancel.   She has a history of HTN, HLD, previous smoker and known CAD with prior CABG x 3 in 2017 with LIMA to LAD; SVG to PD and SVG to DX. Did have post op ventricular ectopy - required short term use of amiodarone.    Last seen by Dr. Johnsie Cancel back in January. Noted to be depressed - was from [place] and wanting to go back - not able to afford. Chronic back pain.   Presented to the ER last week with palpitations/pre syncope along with chest pressure and headache. Admitted. Had stress testing. CT of the head negative. Troponin negative. Intermediate Myoview noted - reviewed with Dr. Johnsie Cancel yesterday who felt this was stable with breast attenuation.   Comes in today. Here alone. She notes she was a little "fuzzy" while she was in the hospital. Feels like she was in a "fog" and was "off" - this has now resolved. No more headache. No more palpitations. Says she never had chest pain. Her BP has been running up - she saw her PCP yesterday - her Lisinopril HCT was changed over to plain Lisinopril at a higher dose. Her potassium was low at admission - now off HCTZ. Has had some swelling under the left arm/left breast - will be having an ultrasound per PCP.   Past Medical History:  Diagnosis Date  . Anxiety   . Back pain   . Coronary artery disease    CABGx3; Seen by Dr. Johnsie Cancel  . Degenerative joint disease (DJD) of lumbar spine 03/24/2016  . Depression   . GERD (gastroesophageal reflux disease)    if overweight  . Hyperlipidemia   . Hypertension     Past Surgical History:  Procedure Laterality Date  . BACK SURGERY    .  CARDIAC CATHETERIZATION N/A 06/05/2016   Procedure: Left Heart Cath and Coronary Angiography;  Surgeon: Belva Crome, MD;  Location: [facility];  Service: Cardiovascular;  Laterality: N/A;  . CORONARY ARTERY BYPASS GRAFT N/A 06/17/2016   Procedure: CORONARY ARTERY BYPASS GRAFTING (CABG), ON PUMP, TIMES THREE, USING LEFT INTERNAL MAMMARY ARTERY, RIGHT GREATER SAPHENOUS VEIN HARVESTED ENDOSCOPICALLY;  Surgeon: Ivin Poot, MD;  Location: [facility];  Service: Open Heart Surgery;  Laterality: N/A;  . MULTIPLE TOOTH EXTRACTIONS    . [place]   lamnectomy L5S1  . TEE WITHOUT CARDIOVERSION N/A 06/17/2016   Procedure: TRANSESOPHAGEAL ECHOCARDIOGRAM (TEE);  Surgeon: Ivin Poot, MD;  Location: [facility];  Service: Open Heart Surgery;  Laterality: N/A;     Medications: Current Meds  Medication Sig  . acetaminophen (TYLENOL) 325 MG tablet Take 325-650 mg by mouth every 6 (six) hours as needed for mild pain or headache.  Marland Kitchen aspirin EC 81 MG tablet Take 1 tablet (81 mg total) by mouth daily.  . Calcium Carb-Cholecalciferol (CALCIUM + D3 PO) Take 1 tablet by mouth daily with breakfast.  . cyclobenzaprine (FLEXERIL) 5 MG tablet Take one to two po qhs prn muscle spasm  . lisinopril (ZESTRIL) 40 MG tablet Take 1 tablet (40 mg total) by  mouth daily.  . metoprolol succinate (TOPROL-XL) 100 MG 24 hr tablet TAKE 1 TABLET DAILY. TAKE WITH OR IMMEDIATELY FOLLOWING A MEAL.  Marland Kitchen Omega-3 Fatty Acids (FISH OIL) 1000 MG CAPS Take 1,000 mg by mouth in the morning and at bedtime.   . potassium chloride SA (KLOR-CON) 20 MEQ tablet Take 2 tablets (40 mEq total) by mouth daily.  . simvastatin (ZOCOR) 40 MG tablet TAKE 1 TABLET EVERY DAY     Allergies: Allergies  Allergen Reactions  . Bee Venom Swelling and Other (See Comments)    Swells where stung- no breathing issues    Social History: The patient  reports that she quit smoking about 13 years ago. She has never used smokeless tobacco. She reports  current alcohol use. She reports current drug use. Drug: Marijuana.   Family History: The patient's family history includes CAD (age of onset: 63) in her sister; Cancer (age of onset: 75) in her sister; Dementia in her father; Heart disease in her father; Hypertension in her mother, sister, sister, and sister; Stroke in her father.   Review of Systems: Please see the history of present illness.   All other systems are reviewed and negative.   Physical Exam: VS:  BP (!) 144/88   Pulse 79   Ht 5\' 4"  (1.626 m)   Wt 176 lb (79.8 kg)   SpO2 97%   BMI 30.21 kg/m  .  BMI Body mass index is 30.21 kg/m.  Wt Readings from Last 3 Encounters:  05/08/20 176 lb (79.8 kg)  05/07/20 177 lb (80.3 kg)  05/02/20 185 lb 6.5 oz (84.1 kg)    General: Alert and in no acute distress.   Cardiac: Regular rate and rhythm. No murmurs, rubs, or gallops. No edema.  Respiratory:  Lungs are clear to auscultation bilaterally with normal work of breathing.  GI: Soft and nontender.  MS: No deformity or atrophy. Gait and ROM intact.  Skin: Warm and dry. Color is normal.  Neuro:  Strength and sensation are intact and no gross focal deficits noted.  Psych: Alert, appropriate and with normal affect.   LABORATORY DATA:  EKG:  EKG is not ordered today.    Lab Results  Component Value Date   WBC 8.2 05/03/2020   HGB 13.8 05/03/2020   HCT 42.1 05/03/2020   PLT 195 05/03/2020   GLUCOSE 111 (H) 05/03/2020   CHOL 137 10/20/2019   TRIG 77.0 10/20/2019   HDL 37.20 (L) 10/20/2019   LDLCALC 84 10/20/2019   ALT 18 05/03/2020   AST 15 05/03/2020   NA 142 05/03/2020   K 3.1 (L) 05/03/2020   CL 106 05/03/2020   CREATININE 0.72 05/03/2020   BUN 11 05/03/2020   CO2 27 05/03/2020   TSH 1.65 10/20/2019   INR 1.23 06/17/2016   HGBA1C 5.8 10/20/2019     BNP (last 3 results) No results for input(s): BNP in the last 8760 hours.  ProBNP (last 3 results) No results for input(s): PROBNP in the last 8760  hours.   Other Studies Reviewed Today:  MYOVIEW 31-May-2020 Narrative & Impression    Findings consistent with prior myocardial infarction with peri-infarct ischemia.  The left ventricular ejection fraction is normal (55-65%).  Nuclear stress EF: 58%.  Horizontal ST segment depression ST segment depression was noted during stress. 1-2 mm  This is a low-intermediate risk study.   Small partially reversible perfusion defect in the anteroapex, moderate perfusion defect with stress that returns to mild with rest.  At the mid ventricle there is a mild perfusion defect with stress that returns to normal at rest.  Findings in combination suggest prior infarct with peri-infarct ischemia.  EKG changes were noted with stress, suggestive of ischemia.  Grossly normal wall motion. The study is low to intermediate risk due to small area of ischemia and ECG changes.    Myoview reviewed with Dr. Johnsie Cancel on 05/08/20 - he feels her study is stable with breast attenuation noted - reassuring study.   TEE 06/17/16 Result status: Final result   Left ventricle: Normal cavity size and wall thickness. LV systolic function is low normal with an EF of 50-55%. No thrombus present. No mass present.  Septum: No Patent Foramen Ovale present. The interatrial septum bows to the left consistent with high right atrial pressure.  Left atrium: Patent foramen ovale not present.  Aortic valve: The valve is trileaflet. Mild valve calcification present. No stenosis. No stenosis by continuous wave doppler Trace regurgitation.  Mitral valve: Mild leaflet thickening is present. Trace regurgitation.  Right ventricle: Normal cavity size and ejection fraction. There is mild hypertrophy.  Tricuspid valve: Mild regurgitation. The tricuspid valve regurgitation jet is central.     Cardiac Catheterization:  06/05/16 Left Heart Cath and Coronary Angiography  Conclusion    Severe calcific, diffuse three-vessel coronary  disease.  Codominant LAD/diagonal system with severe diffuse disease in each vessel with graftable distal targets.  Eccentric 30-60% ostial left main depending upon review.  99% proximal to mid RCA. Total occlusion at the ostium of a large PDA that fills left-to-right collaterals.  Moderate mid circumflex disease but without graftable targets beyond the stenosis.  Normal left ventricular systolic function with normal hemodynamics   RECOMMENDATIONS:   Continue current medical regimen.  Expedient evaluation by [facility] for multivessel coronary bypass grafting.       ASSESSMENT & PLAN:   1. Near syncope/palpitations - in the setting of low potassium - seems resolved - would consider monitor if recurs. She is adamant that she did not have chest pain - feels this is now more related to the swelling under her arm/left breast - she has an ultrasound to be done. She will continue with her current dose of beta blocker.   2. Known CAD with prior CABG x 3 in 2017 - stable Myoview with breast attenuation - this was reviewed yesterday with Dr. Johnsie Cancel. She denies chest pain at this time.   3. HLD - on statin   4. HTN - she has had changes made to her regimen as of yesterday - would follow for now.   5. PVCs - on beta blocker - would continue.   6. Hypokalemia - her HCTZ has been stopped - her ACE has been increased.   Current medicines are reviewed with the patient today.  The patient does not have concerns regarding medicines other than what has been noted above.  The following changes have been made:  See above.  Labs/ tests ordered today include:   No orders of the defined types were placed in this encounter.    Disposition:   FU with Dr. Johnsie Cancel as planned in January.     Patient is agreeable to this plan and will call if any problems develop in the interim.   SignedTruitt Merle, NP  05/08/2020 4:07 PM  [facility] [address] [place], [place]  [address] Phone: [phone] Fax: [phone]

## 2020-05-07 ENCOUNTER — Ambulatory Visit (INDEPENDENT_AMBULATORY_CARE_PROVIDER_SITE_OTHER): Payer: Medicare HMO | Admitting: Family Medicine

## 2020-05-07 ENCOUNTER — Other Ambulatory Visit: Payer: Self-pay

## 2020-05-07 ENCOUNTER — Encounter: Payer: Self-pay | Admitting: Family Medicine

## 2020-05-07 VITALS — BP 138/80 | HR 71 | Temp 97.6°F | Wt 177.0 lb

## 2020-05-07 DIAGNOSIS — R55 Syncope and collapse: Secondary | ICD-10-CM | POA: Diagnosis not present

## 2020-05-07 DIAGNOSIS — R2232 Localized swelling, mass and lump, left upper limb: Secondary | ICD-10-CM | POA: Diagnosis not present

## 2020-05-07 DIAGNOSIS — I10 Essential (primary) hypertension: Secondary | ICD-10-CM

## 2020-05-07 DIAGNOSIS — N6320 Unspecified lump in the left breast, unspecified quadrant: Secondary | ICD-10-CM

## 2020-05-07 DIAGNOSIS — E876 Hypokalemia: Secondary | ICD-10-CM

## 2020-05-07 MED ORDER — LISINOPRIL 40 MG PO TABS: 40.0000 mg | ORAL_TABLET | Freq: Every day | ORAL | 3 refills | Status: DC

## 2020-05-07 NOTE — Patient Instructions (Addendum)
A new prescription for lisinopril 40 mg was sent to your pharmacy.  For now stop taking the lisinopril-hydrochlorothiazide 20-25 mg pills daily and start the new dose of lisinopril (40 mg).  Continue taking the metoprolol succinate (Toprol-XL) 100 mg daily.  Continue checking your blood pressure at home daily and keeping a log to bring with you to your next appointment.  It is important that you increase your intake of water.  Near-Syncope Near-syncope is when you suddenly feel like you might pass out (faint), but you do not actually lose consciousness. This may also be referred to as presyncope. During an episode of near-syncope, you may:  Feel dizzy, weak, or light-headed.  Feel nauseous.  See all white or all black in your field of vision, or see spots.  Have cold, clammy skin. This condition is caused by a sudden decrease in blood flow to the brain. This decrease can result from various causes, but most of those causes are not dangerous. However, near-syncope may be a sign of a serious medical problem, so it is important to seek medical care. Follow these instructions at home: Medicines  Take over-the-counter and prescription medicines only as told by your health care provider.  If you are taking blood pressure or heart medicine, get up slowly and take several minutes to sit and then stand. This can reduce dizziness. General instructions  Pay attention to any changes in your symptoms.  Talk with your health care provider about your symptoms. You may need to have testing to understand the cause of your near-syncope.  If you start to feel like you might faint, lie down right away and raise (elevate) your feet above the level of your heart. Breathe deeply and steadily. Wait until all of the symptoms have passed.  Have someone stay with you until you feel stable.  Do not drive, use machinery, or play sports until your health care provider says it is okay.  Drink enough fluid to keep  your urine pale yellow.  Keep all follow-up visits as told by your health care provider. This is important. Get help right away if you:  Have a seizure.  Have unusual pain in your chest, abdomen, or back.  Faint once or repeatedly.  Have a severe headache.  Are bleeding from your mouth or rectum, or you have black or tarry stool.  Have a very fast or irregular heartbeat (palpitations).  Are confused.  Have trouble walking.  Have severe weakness.  Have vision problems. These symptoms may represent a serious problem that is an emergency. Do not wait to see if your symptoms will go away. Get medical help right away. Call your local emergency services (911 in the [place] Do not drive yourself to the hospital. Summary  Near-syncope is when you suddenly feel like you might pass out (faint), but you do not actually lose consciousness.  This condition is caused by a sudden decrease in blood flow to the brain. This decrease can result from various causes, but most of those causes are not dangerous.  Near-syncope may be a sign of a serious medical problem, so it is important to seek medical care. This information is not intended to replace advice given to you by your health care provider. Make sure you discuss any questions you have with your health care provider. Document Revised: 10/08/2018 Document Reviewed: 05/05/2018 Elsevier Patient Education  Arp Your Hypertension Hypertension is commonly called high blood pressure. This is when the force of your  blood pressing against the walls of your arteries is too strong. Arteries are blood vessels that carry blood from your heart throughout your body. Hypertension forces the heart to work harder to pump blood, and may cause the arteries to become narrow or stiff. Having untreated or uncontrolled hypertension can cause heart attack, stroke, kidney disease, and other problems. What are blood pressure readings? A blood  pressure reading consists of a higher number over a lower number. Ideally, your blood pressure should be below 120/80. The first ("top") number is called the systolic pressure. It is a measure of the pressure in your arteries as your heart beats. The second ("bottom") number is called the diastolic pressure. It is a measure of the pressure in your arteries as the heart relaxes. What does my blood pressure reading mean? Blood pressure is classified into four stages. Based on your blood pressure reading, your health care provider may use the following stages to determine what type of treatment you need, if any. Systolic pressure and diastolic pressure are measured in a unit called mm Hg. Normal  Systolic pressure: below 956.  Diastolic pressure: below 80. Elevated  Systolic pressure: 213-086.  Diastolic pressure: below 80. Hypertension stage 1  Systolic pressure: 578-469.  Diastolic pressure: 62-95. Hypertension stage 2  Systolic pressure: 284 or above.  Diastolic pressure: 90 or above. What health risks are associated with hypertension? Managing your hypertension is an important responsibility. Uncontrolled hypertension can lead to:  A heart attack.  A stroke.  A weakened blood vessel (aneurysm).  Heart failure.  Kidney damage.  Eye damage.  Metabolic syndrome.  Memory and concentration problems. What changes can I make to manage my hypertension? Hypertension can be managed by making lifestyle changes and possibly by taking medicines. Your health care provider will help you make a plan to bring your blood pressure within a normal range. Eating and drinking   Eat a diet that is high in fiber and potassium, and low in salt (sodium), added sugar, and fat. An example eating plan is called the DASH (Dietary Approaches to Stop Hypertension) diet. To eat this way: ? Eat plenty of fresh fruits and vegetables. Try to fill half of your plate at each meal with fruits and  vegetables. ? Eat whole grains, such as whole wheat pasta, brown rice, or whole grain bread. Fill about one quarter of your plate with whole grains. ? Eat low-fat diary products. ? Avoid fatty cuts of meat, processed or cured meats, and poultry with skin. Fill about one quarter of your plate with lean proteins such as fish, chicken without skin, beans, eggs, and tofu. ? Avoid premade and processed foods. These tend to be higher in sodium, added sugar, and fat.  Reduce your daily sodium intake. Most people with hypertension should eat less than 1,500 mg of sodium a day.  Limit alcohol intake to no more than 1 drink a day for nonpregnant women and 2 drinks a day for men. One drink equals 12 oz of beer, 5 oz of wine, or 1 oz of hard liquor. Lifestyle  Work with your health care provider to maintain a healthy body weight, or to lose weight. Ask what an ideal weight is for you.  Get at least 30 minutes of exercise that causes your heart to beat faster (aerobic exercise) most days of the week. Activities may include walking, swimming, or biking.  Include exercise to strengthen your muscles (resistance exercise), such as weight lifting, as part of your weekly exercise  routine. Try to do these types of exercises for 30 minutes at least 3 days a week.  Do not use any products that contain nicotine or tobacco, such as cigarettes and e-cigarettes. If you need help quitting, ask your health care provider.  Control any long-term (chronic) conditions you have, such as high cholesterol or diabetes. Monitoring  Monitor your blood pressure at home as told by your health care provider. Your personal target blood pressure may vary depending on your medical conditions, your age, and other factors.  Have your blood pressure checked regularly, as often as told by your health care provider. Working with your health care provider  Review all the medicines you take with your health care provider because there may  be side effects or interactions.  Talk with your health care provider about your diet, exercise habits, and other lifestyle factors that may be contributing to hypertension.  Visit your health care provider regularly. Your health care provider can help you create and adjust your plan for managing hypertension. Will I need medicine to control my blood pressure? Your health care provider may prescribe medicine if lifestyle changes are not enough to get your blood pressure under control, and if:  Your systolic blood pressure is 130 or higher.  Your diastolic blood pressure is 80 or higher. Take medicines only as told by your health care provider. Follow the directions carefully. Blood pressure medicines must be taken as prescribed. The medicine does not work as well when you skip doses. Skipping doses also puts you at risk for problems. Contact a health care provider if:  You think you are having a reaction to medicines you have taken.  You have repeated (recurrent) headaches.  You feel dizzy.  You have swelling in your ankles.  You have trouble with your vision. Get help right away if:  You develop a severe headache or confusion.  You have unusual weakness or numbness, or you feel faint.  You have severe pain in your chest or abdomen.  You vomit repeatedly.  You have trouble breathing. Summary  Hypertension is when the force of blood pumping through your arteries is too strong. If this condition is not controlled, it may put you at risk for serious complications.  Your personal target blood pressure may vary depending on your medical conditions, your age, and other factors. For most people, a normal blood pressure is less than 120/80.  Hypertension is managed by lifestyle changes, medicines, or both. Lifestyle changes include weight loss, eating a healthy, low-sodium diet, exercising more, and limiting alcohol. This information is not intended to replace advice given to you by  your health care provider. Make sure you discuss any questions you have with your health care provider. Document Revised: 10/08/2018 Document Reviewed: 05/14/2016 Elsevier Patient Education  Donna Woodward.
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
## 2020-05-08 ENCOUNTER — Ambulatory Visit: Payer: Medicare HMO | Admitting: Nurse Practitioner

## 2020-05-08 ENCOUNTER — Encounter: Payer: Self-pay | Admitting: Nurse Practitioner

## 2020-05-08 VITALS — BP 144/88 | HR 79 | Ht 64.0 in | Wt 176.0 lb

## 2020-05-08 DIAGNOSIS — Z951 Presence of aortocoronary bypass graft: Secondary | ICD-10-CM | POA: Diagnosis not present

## 2020-05-08 DIAGNOSIS — E785 Hyperlipidemia, unspecified: Secondary | ICD-10-CM | POA: Diagnosis not present

## 2020-05-08 DIAGNOSIS — I10 Essential (primary) hypertension: Secondary | ICD-10-CM

## 2020-05-08 DIAGNOSIS — I25810 Atherosclerosis of coronary artery bypass graft(s) without angina pectoris: Secondary | ICD-10-CM

## 2020-05-08 DIAGNOSIS — I493 Ventricular premature depolarization: Secondary | ICD-10-CM | POA: Diagnosis not present

## 2020-05-08 NOTE — Patient Instructions (Addendum)
After Visit Summary:  We will be checking the following labs today - NONE   Medication Instructions:    Continue with your current medicines.    If you need a refill on your cardiac medications before your next appointment, please call your pharmacy.     Testing/Procedures To Be Arranged:  N/A  Follow-Up:   See Dr. Johnsie Cancel in January as planned.     At [facility], you and your health needs are our priority.  As part of our continuing mission to provide you with exceptional heart care, we have created designated Provider Care Teams.  These Care Teams include your primary Cardiologist (physician) and Advanced Practice Providers (APPs -  Physician Assistants and Nurse Practitioners) who all work together to provide you with the care you need, when you need it.  Special Instructions:  . Stay safe, wash your hands for at least 20 seconds and wear a mask when needed.  . It was good to talk with you today.    Call the [place] office at [phone] if you have any questions, problems or concerns.

## 2020-05-09 ENCOUNTER — Other Ambulatory Visit: Payer: Self-pay | Admitting: Family Medicine

## 2020-05-12 ENCOUNTER — Encounter: Payer: Self-pay | Admitting: Family Medicine

## 2020-05-19 ENCOUNTER — Encounter: Payer: Self-pay | Admitting: Family Medicine

## 2020-05-21 NOTE — Telephone Encounter (Signed)
Spoke with pt advised to call [facility] for Breast US scheduling. Pt verbalized understanding

## 2020-06-08 ENCOUNTER — Other Ambulatory Visit: Payer: Self-pay

## 2020-06-08 ENCOUNTER — Ambulatory Visit (INDEPENDENT_AMBULATORY_CARE_PROVIDER_SITE_OTHER): Payer: Medicare HMO | Admitting: Family Medicine

## 2020-06-08 ENCOUNTER — Encounter: Payer: Self-pay | Admitting: Family Medicine

## 2020-06-08 VITALS — BP 138/96 | HR 55 | Temp 97.7°F | Wt 176.6 lb

## 2020-06-08 DIAGNOSIS — I10 Essential (primary) hypertension: Secondary | ICD-10-CM | POA: Diagnosis not present

## 2020-06-08 LAB — BASIC METABOLIC PANEL WITH GFR
BUN: 14 mg/dL (ref 7–25)
CO2: 31 mmol/L (ref 20–32)
Calcium: 9.6 mg/dL (ref 8.6–10.4)
Chloride: 103 mmol/L (ref 98–110)
Creat: 0.72 mg/dL (ref 0.50–0.99)
GFR, Est African American: 103 mL/min/{1.73_m2} (ref >=60)
GFR, Est Non African American: 88 mL/min/{1.73_m2} (ref >=60)
Glucose, Bld: 89 mg/dL (ref 65–99)
Potassium: 4.6 mmol/L (ref 3.5–5.3)
Sodium: 141 mmol/L (ref 135–146)

## 2020-06-08 MED ORDER — BLOOD PRESSURE CUFF MISC: 0 refills | Status: DC

## 2020-06-08 MED ORDER — CYCLOBENZAPRINE HCL 5 MG PO TABS: ORAL_TABLET | ORAL | 1 refills | Status: DC

## 2020-06-08 NOTE — Progress Notes (Signed)
Subjective:    Patient ID: Donna Woodward, female    DOB: July 22, 1955, 64 y.o.   MRN: [id number]  No chief complaint on file.   HPI Patient is a 64 year old female with past medical history notable for anxiety, chronic back pain, CAD s/p CABG x3 (LIMA to LAD, SVG to PD, SVG to DX), DJD, depression, GERD, HLD, HTN who was seen for follow-up.  Patient taking Toprol-XL 100 mg and lisinopril 40 mg for HTN.  Patient has a record of her blood pressure readings.  States her bp monitor is several years old.  Readings at home 172/108, 152/90, 162/89, 151/80, 160/94, 199/80, 133/68 with pulse 48-82.  Pt drinking 2-3 glasses of water per day. Pt denies HA, syncope, CP, changes in vision since last ED visit 11/3 for HTN.  Cooking at home. Working to decrease stress.  Requesting refill on flexeril.  In the past was on lisinopril-HCTZ combo pill but had hypokalemia.  Past Medical History:  Diagnosis Date  . Anxiety   . Back pain   . Coronary artery disease    CABGx3; Seen by Dr. Johnsie Cancel  . Degenerative joint disease (DJD) of lumbar spine 03/24/2016  . Depression   . GERD (gastroesophageal reflux disease)    if overweight  . Hyperlipidemia   . Hypertension     Allergies  Allergen Reactions  . Bee Venom Swelling and Other (See Comments)    Swells where stung- no breathing issues    ROS General: Denies fever, chills, night sweats, changes in weight, changes in appetite HEENT: Denies headaches, ear pain, changes in vision, rhinorrhea, sore throat CV: Denies CP, palpitations, SOB, orthopnea Pulm: Denies SOB, cough, wheezing GI: Denies abdominal pain, nausea, vomiting, diarrhea, constipation GU: Denies dysuria, hematuria, frequency, vaginal discharge Msk: Denies muscle cramps, joint pains  +back pain Neuro: Denies weakness, numbness, tingling Skin: Denies rashes, bruising Psych: Denies depression, anxiety, hallucinations     Objective:    Blood pressure (!) 160/94, pulse (!) 55, temperature  97.7 F (36.5 C), temperature source Oral, weight 176 lb 9.6 oz (80.1 kg), SpO2 99 %.  BP rechecked by this provider 138/96  Gen. Pleasant, well-nourished, in no distress, normal affect  HEENT: [place]/AT, face symmetric, conjunctiva clear, no scleral icterus, PERRLA, EOMI, nares patent without drainage Lungs: no accessory muscle use, CTAB, no wheezes or rales Cardiovascular: RRR, no m/r/g, no peripheral edema Musculoskeletal: No deformities, no cyanosis or clubbing, normal tone Neuro:  A&Ox3, CN II-XII intact, normal gait Skin:  Warm, no lesions/ rash   Wt Readings from Last 3 Encounters:  06/08/20 176 lb 9.6 oz (80.1 kg)  05/08/20 176 lb (79.8 kg)  05/07/20 177 lb (80.3 kg)    Lab Results  Component Value Date   WBC 8.2 05/03/2020   HGB 13.8 05/03/2020   HCT 42.1 05/03/2020   PLT 195 05/03/2020   GLUCOSE 111 (H) 05/03/2020   CHOL 137 10/20/2019   TRIG 77.0 10/20/2019   HDL 37.20 (L) 10/20/2019   LDLCALC 84 10/20/2019   ALT 18 05/03/2020   AST 15 05/03/2020   NA 142 05/03/2020   K 3.1 (L) 05/03/2020   CL 106 05/03/2020   CREATININE 0.72 05/03/2020   BUN 11 05/03/2020   CO2 27 05/03/2020   TSH 1.65 10/20/2019   INR 1.23 06/17/2016   HGBA1C 5.8 10/20/2019    Assessment/Plan:  Essential hypertension  -elevated. -recheck 138/96 -obtain new bp cuff -Importance of lifestyle modifications -continue current meds lisinopril 40 mg and toprol xl 100  mg -For continued elevation in BP discussed addition of another blood pressure medication such as CCB or consider HCTZ with potassium supplement given hypokalemia in the past.  Would be hesitant to increase toprol xl to 200 mg given pulse in in 50s-60s -monitor for dry cough -continue f/u with Cardiology - Plan: Blood Pressure Monitoring (BLOOD PRESSURE CUFF) MISC, BMP  F/u in 4-6 wks for HTN   Grier Mitts, MD

## 2020-06-08 NOTE — Patient Instructions (Signed)
Exercises To Do While Sitting  Exercises that you do while sitting (chair exercises) can give you many of the same benefits as full exercise. Benefits include strengthening your heart, burning calories, and keeping muscles and joints healthy. Exercise can also improve your mood and help with depression and anxiety. You may benefit from chair exercises if you are unable to do standing exercises because of:  Diabetic foot pain.  Obesity.  Illness.  Arthritis.  Recovery from surgery or injury.  Breathing problems.  Balance problems.  Another type of disability. Before starting chair exercises, check with your health care provider or a physical therapist to find out how much exercise you can tolerate and which exercises are safe for you. If your health care provider approves:  Start out slowly and build up over time. Aim to work up to about 10-20 minutes for each exercise session.  Make exercise part of your daily routine.  Drink water when you exercise. Do not wait until you are thirsty. Drink every 10-15 minutes.  Stop exercising right away if you have pain, nausea, shortness of breath, or dizziness.  If you are exercising in a wheelchair, make sure to lock the wheels.  Ask your health care provider whether you can do tai chi or yoga. Many positions in these mind-body exercises can be modified to do while seated. Warm-up Before starting other exercises: 1. Sit up as straight as you can. Have your knees bent at 90 degrees, which is the shape of the capital letter "L." Keep your feet flat on the floor. 2. Sit at the front edge of your chair, if you can. 3. Pull in (tighten) the muscles in your abdomen and stretch your spine and neck as straight as you can. Hold this position for a few minutes. 4. Breathe in and out evenly. Try to concentrate on your breathing, and relax your mind. Stretching Exercise A: Arm stretch 1. Hold your arms out straight in front of your body. 2. Bend  your hands at the wrist with your fingers pointing up, as if signaling someone to stop. Notice the slight tension in your forearms as you hold the position. 3. Keeping your arms out and your hands bent, rotate your hands outward as far as you can and hold this stretch. Aim to have your thumbs pointing up and your pinkie fingers pointing down. Slowly repeat arm stretches for one minute as tolerated. Exercise B: Leg stretch 1. If you can move your legs, try to "draw" letters on the floor with the toes of your foot. Write your name with one foot. 2. Write your name with the toes of your other foot. Slowly repeat the movements for one minute as tolerated. Exercise C: Reach for the sky 1. Reach your hands as far over your head as you can to stretch your spine. 2. Move your hands and arms as if you are climbing a rope. Slowly repeat the movements for one minute as tolerated. Range of motion exercises Exercise A: Shoulder roll 1. Let your arms hang loosely at your sides. 2. Lift just your shoulders up toward your ears, then let them relax back down. 3. When your shoulders feel loose, rotate your shoulders in backward and forward circles. Do shoulder rolls slowly for one minute as tolerated. Exercise B: March in place 1. As if you are marching, pump your arms and lift your legs up and down. Lift your knees as high as you can. ? If you are unable to lift your knees,  just pump your arms and move your ankles and feet up and down. March in place for one minute as tolerated. Exercise C: Seated jumping jacks 1. Let your arms hang down straight. 2. Keeping your arms straight, lift them up over your head. Aim to point your fingers to the ceiling. 3. While you lift your arms, straighten your legs and slide your heels along the floor to your sides, as wide as you can. 4. As you bring your arms back down to your sides, slide your legs back together. ? If you are unable to use your legs, just move your  arms. Slowly repeat seated jumping jacks for one minute as tolerated. Strengthening exercises Exercise A: Shoulder squeeze 1. Hold your arms straight out from your body to your sides, with your elbows bent and your fists pointed at the ceiling. 2. Keeping your arms in the bent position, move them forward so your elbows and forearms meet in front of your face. 3. Open your arms back out as wide as you can with your elbows still bent, until you feel your shoulder blades squeezing together. Hold for 5 seconds. Slowly repeat the movements forward and backward for one minute as tolerated. Contact a health care provider if you:  Had to stop exercising due to any of the following: ? Pain. ? Nausea. ? Shortness of breath. ? Dizziness. ? Fatigue.  Have significant pain or soreness after exercising. Get help right away if you have:  Chest pain.  Difficulty breathing. These symptoms may represent a serious problem that is an emergency. Do not wait to see if the symptoms will go away. Get medical help right away. Call your local emergency services (911 in the [place] Do not drive yourself to the hospital. This information is not intended to replace advice given to you by your health care provider. Make sure you discuss any questions you have with your health care provider. Document Revised: 10/07/2018 Document Reviewed: 04/29/2017 Elsevier Patient Education  2020 [place] Your Hypertension Hypertension is commonly called high blood pressure. This is when the force of your blood pressing against the walls of your arteries is too strong. Arteries are blood vessels that carry blood from your heart throughout your body. Hypertension forces the heart to work harder to pump blood, and may cause the arteries to become narrow or stiff. Having untreated or uncontrolled hypertension can cause heart attack, stroke, kidney disease, and other problems. What are blood pressure readings? A blood  pressure reading consists of a higher number over a lower number. Ideally, your blood pressure should be below 120/80. The first ("top") number is called the systolic pressure. It is a measure of the pressure in your arteries as your heart beats. The second ("bottom") number is called the diastolic pressure. It is a measure of the pressure in your arteries as the heart relaxes. What does my blood pressure reading mean? Blood pressure is classified into four stages. Based on your blood pressure reading, your health care provider may use the following stages to determine what type of treatment you need, if any. Systolic pressure and diastolic pressure are measured in a unit called mm Hg. Normal  Systolic pressure: below 403.  Diastolic pressure: below 80. Elevated  Systolic pressure: 474-259.  Diastolic pressure: below 80. Hypertension stage 1  Systolic pressure: 563-875.  Diastolic pressure: 64-33. Hypertension stage 2  Systolic pressure: 295 or above.  Diastolic pressure: 90 or above. What health risks are associated with hypertension? Managing  your hypertension is an important responsibility. Uncontrolled hypertension can lead to:  A heart attack.  A stroke.  A weakened blood vessel (aneurysm).  Heart failure.  Kidney damage.  Eye damage.  Metabolic syndrome.  Memory and concentration problems. What changes can I make to manage my hypertension? Hypertension can be managed by making lifestyle changes and possibly by taking medicines. Your health care provider will help you make a plan to bring your blood pressure within a normal range. Eating and drinking   Eat a diet that is high in fiber and potassium, and low in salt (sodium), added sugar, and fat. An example eating plan is called the DASH (Dietary Approaches to Stop Hypertension) diet. To eat this way: ? Eat plenty of fresh fruits and vegetables. Try to fill half of your plate at each meal with fruits and  vegetables. ? Eat whole grains, such as whole wheat pasta, brown rice, or whole grain bread. Fill about one quarter of your plate with whole grains. ? Eat low-fat diary products. ? Avoid fatty cuts of meat, processed or cured meats, and poultry with skin. Fill about one quarter of your plate with lean proteins such as fish, chicken without skin, beans, eggs, and tofu. ? Avoid premade and processed foods. These tend to be higher in sodium, added sugar, and fat.  Reduce your daily sodium intake. Most people with hypertension should eat less than 1,500 mg of sodium a day.  Limit alcohol intake to no more than 1 drink a day for nonpregnant women and 2 drinks a day for men. One drink equals 12 oz of beer, 5 oz of wine, or 1 oz of hard liquor. Lifestyle  Work with your health care provider to maintain a healthy body weight, or to lose weight. Ask what an ideal weight is for you.  Get at least 30 minutes of exercise that causes your heart to beat faster (aerobic exercise) most days of the week. Activities may include walking, swimming, or biking.  Include exercise to strengthen your muscles (resistance exercise), such as weight lifting, as part of your weekly exercise routine. Try to do these types of exercises for 30 minutes at least 3 days a week.  Do not use any products that contain nicotine or tobacco, such as cigarettes and e-cigarettes. If you need help quitting, ask your health care provider.  Control any long-term (chronic) conditions you have, such as high cholesterol or diabetes. Monitoring  Monitor your blood pressure at home as told by your health care provider. Your personal target blood pressure may vary depending on your medical conditions, your age, and other factors.  Have your blood pressure checked regularly, as often as told by your health care provider. Working with your health care provider  Review all the medicines you take with your health care provider because there may  be side effects or interactions.  Talk with your health care provider about your diet, exercise habits, and other lifestyle factors that may be contributing to hypertension.  Visit your health care provider regularly. Your health care provider can help you create and adjust your plan for managing hypertension. Will I need medicine to control my blood pressure? Your health care provider may prescribe medicine if lifestyle changes are not enough to get your blood pressure under control, and if:  Your systolic blood pressure is 130 or higher.  Your diastolic blood pressure is 80 or higher. Take medicines only as told by your health care provider. Follow the directions carefully.  Blood pressure medicines must be taken as prescribed. The medicine does not work as well when you skip doses. Skipping doses also puts you at risk for problems. Contact a health care provider if:  You think you are having a reaction to medicines you have taken.  You have repeated (recurrent) headaches.  You feel dizzy.  You have swelling in your ankles.  You have trouble with your vision. Get help right away if:  You develop a severe headache or confusion.  You have unusual weakness or numbness, or you feel faint.  You have severe pain in your chest or abdomen.  You vomit repeatedly.  You have trouble breathing. Summary  Hypertension is when the force of blood pumping through your arteries is too strong. If this condition is not controlled, it may put you at risk for serious complications.  Your personal target blood pressure may vary depending on your medical conditions, your age, and other factors. For most people, a normal blood pressure is less than 120/80.  Hypertension is managed by lifestyle changes, medicines, or both. Lifestyle changes include weight loss, eating a healthy, low-sodium diet, exercising more, and limiting alcohol. This information is not intended to replace advice given to you by  your health care provider. Make sure you discuss any questions you have with your health care provider. Document Revised: 10/08/2018 Document Reviewed: 05/14/2016 Elsevier Patient Education  [place].

## 2020-06-26 ENCOUNTER — Other Ambulatory Visit: Payer: Self-pay

## 2020-06-26 ENCOUNTER — Ambulatory Visit
Admission: RE | Admit: 2020-06-26 | Discharge: 2020-06-26 | Disposition: A | Discharge status: Home / Self Care | Payer: Medicare HMO | Source: Ambulatory Visit | Attending: Family Medicine | Admitting: Family Medicine

## 2020-06-26 DIAGNOSIS — N6320 Unspecified lump in the left breast, unspecified quadrant: Secondary | ICD-10-CM

## 2020-06-26 DIAGNOSIS — R928 Other abnormal and inconclusive findings on diagnostic imaging of breast: Secondary | ICD-10-CM | POA: Diagnosis not present

## 2020-06-26 DIAGNOSIS — R2232 Localized swelling, mass and lump, left upper limb: Secondary | ICD-10-CM

## 2020-06-26 DIAGNOSIS — N6489 Other specified disorders of breast: Secondary | ICD-10-CM | POA: Diagnosis not present

## 2020-06-28 ENCOUNTER — Encounter: Payer: Self-pay | Admitting: Family Medicine

## 2020-07-08 ENCOUNTER — Encounter: Payer: Self-pay | Admitting: Family Medicine

## 2020-07-17 NOTE — Progress Notes (Signed)
CARDIOLOGY OFFICE NOTE  Date:  07/26/2020    Donna Woodward Date of Birth: [birth year] Medical Record #[id number]  PCP:  Billie Ruddy, MD  Cardiologist:  Johnsie Cancel   No chief complaint on file.   History of Present Illness: Donna Woodward is a 65 y.o. female  has a history of HTN, HLD, previous smoker and known CAD with prior CABG x 3 in 2017 with LIMA to LAD; SVG to PD and SVG to DX. Did have post op ventricular ectopy - required short term use of amiodarone.    Presented to the ER 05/02/20  with palpitations/pre syncope along with chest pressure and headache.. CT of the head negative. Troponin negative. Myovue called ischemia but to my read breast attenuation low risk K was low and diuretic d/c with increase in ACE BP up on visit with primary 06/08/20   Activity limited by chronic back pain   Primary added Nifedipine low dose BP still high  Discussed adding a K sparing diuretic   Past Medical History:  Diagnosis Date  . Anxiety   . Back pain   . Coronary artery disease    CABGx3; Seen by Dr. Johnsie Cancel  . Degenerative joint disease (DJD) of lumbar spine 03/24/2016  . Depression   . GERD (gastroesophageal reflux disease)    if overweight  . Hyperlipidemia   . Hypertension     Past Surgical History:  Procedure Laterality Date  . BACK SURGERY    . CARDIAC CATHETERIZATION N/A 06/05/2016   Procedure: Left Heart Cath and Coronary Angiography;  Surgeon: Belva Crome, MD;  Location: [facility];  Service: Cardiovascular;  Laterality: N/A;  . CORONARY ARTERY BYPASS GRAFT N/A 06/17/2016   Procedure: CORONARY ARTERY BYPASS GRAFTING (CABG), ON PUMP, TIMES THREE, USING LEFT INTERNAL MAMMARY ARTERY, RIGHT GREATER SAPHENOUS VEIN HARVESTED ENDOSCOPICALLY;  Surgeon: Ivin Poot, MD;  Location: [facility];  Service: Open Heart Surgery;  Laterality: N/A;  . MULTIPLE TOOTH EXTRACTIONS    . [place]   lamnectomy L5S1  . TEE WITHOUT CARDIOVERSION N/A 06/17/2016    Procedure: TRANSESOPHAGEAL ECHOCARDIOGRAM (TEE);  Surgeon: Ivin Poot, MD;  Location: [facility];  Service: Open Heart Surgery;  Laterality: N/A;     Medications: Current Meds  Medication Sig  . acetaminophen (TYLENOL) 325 MG tablet Take 325-650 mg by mouth every 6 (six) hours as needed for mild pain or headache.  Marland Kitchen aspirin EC 81 MG tablet Take 1 tablet (81 mg total) by mouth daily.  . Blood Pressure Monitoring (BLOOD PRESSURE CUFF) MISC For daily use.  . Calcium Carb-Cholecalciferol (CALCIUM + D3 PO) Take 1 tablet by mouth daily with breakfast.  . cyclobenzaprine (FLEXERIL) 5 MG tablet Take one to two po qhs prn muscle spasm  . lisinopril (ZESTRIL) 40 MG tablet Take 1 tablet (40 mg total) by mouth daily.  . metoprolol succinate (TOPROL-XL) 100 MG 24 hr tablet TAKE 1 TABLET DAILY. TAKE WITH OR IMMEDIATELY FOLLOWING A MEAL.  Marland Kitchen NIFEdipine (PROCARDIA-XL/NIFEDICAL-XL) 30 MG 24 hr tablet Take 1 tablet (30 mg total) by mouth daily.  . Omega-3 Fatty Acids (FISH OIL) 1000 MG CAPS Take 1,000 mg by mouth in the morning and at bedtime.   . potassium chloride SA (KLOR-CON) 20 MEQ tablet Take 2 tablets (40 mEq total) by mouth daily.  . simvastatin (ZOCOR) 40 MG tablet TAKE 1 TABLET EVERY DAY     Allergies: Allergies  Allergen Reactions  . Bee Venom Swelling  and Other (See Comments)    Swells where stung- no breathing issues    Social History: The patient  reports that she quit smoking about 14 years ago. She has never used smokeless tobacco. She reports current alcohol use. She reports current drug use. Drug: Marijuana.   Family History: The patient's family history includes CAD (age of onset: 25) in her sister; Cancer (age of onset: 54) in her sister; Dementia in her father; Heart disease in her father; Hypertension in her mother, sister, sister, and sister; Stroke in her father.   Review of Systems: Please see the history of present illness.   All other systems are reviewed and negative.    Physical Exam: VS:  BP (!) 164/96   Pulse 73   Ht 5\' 4"  (1.626 m)   Wt 79.4 kg   SpO2 98%   BMI 30.04 kg/m  .  BMI Body mass index is 30.04 kg/m.  Wt Readings from Last 3 Encounters:  07/26/20 79.4 kg  07/19/20 80.3 kg  06/08/20 80.1 kg    Affect appropriate Healthy:  appears stated age 34: normal Neck supple with no adenopathy JVP normal no bruits no thyromegaly Lungs clear with no wheezing and good diaphragmatic motion Heart:  S1/S2 no murmur, no rub, gallop or click PMI normal post sternotomy  Abdomen: benighn, BS positve, no tenderness, no AAA no bruit.  No HSM or HJR Distal pulses intact with no bruits No edema Neuro non-focal Skin warm and dry No muscular weakness    LABORATORY DATA:  EKG:  EKG is not ordered today.    Lab Results  Component Value Date   WBC 8.2 05/03/2020   HGB 13.8 05/03/2020   HCT 42.1 05/03/2020   PLT 195 05/03/2020   GLUCOSE 89 06/08/2020   CHOL 137 10/20/2019   TRIG 77.0 10/20/2019   HDL 37.20 (L) 10/20/2019   LDLCALC 84 10/20/2019   ALT 18 05/03/2020   AST 15 05/03/2020   NA 141 06/08/2020   K 4.6 06/08/2020   CL 103 06/08/2020   CREATININE 0.72 06/08/2020   BUN 14 06/08/2020   CO2 31 06/08/2020   TSH 1.65 10/20/2019   INR 1.23 06/17/2016   HGBA1C 5.8 10/20/2019     BNP (last 3 results) No results for input(s): BNP in the last 8760 hours.  ProBNP (last 3 results) No results for input(s): PROBNP in the last 8760 hours.   Other Studies Reviewed Today:  MYOVIEW 2020/05/18 Narrative & Impression    Findings consistent with prior myocardial infarction with peri-infarct ischemia.  The left ventricular ejection fraction is normal (55-65%).  Nuclear stress EF: 58%.  Horizontal ST segment depression ST segment depression was noted during stress. 1-2 mm  This is a low-intermediate risk study.   Small partially reversible perfusion defect in the anteroapex, moderate perfusion defect with stress that returns  to mild with rest.  At the mid ventricle there is a mild perfusion defect with stress that returns to normal at rest.  Findings in combination suggest prior infarct with peri-infarct ischemia.  EKG changes were noted with stress, suggestive of ischemia.  Grossly normal wall motion. The study is low to intermediate risk due to small area of ischemia and ECG changes.    Myoview reviewed with Dr. Johnsie Cancel on 07/26/20 - he feels her study is stable with breast attenuation noted - reassuring study.   TEE 06/17/16 Result status: Final result   Left ventricle: Normal cavity size and wall thickness. LV systolic function  is low normal with an EF of 50-55%. No thrombus present. No mass present.  Septum: No Patent Foramen Ovale present. The interatrial septum bows to the left consistent with high right atrial pressure.  Left atrium: Patent foramen ovale not present.  Aortic valve: The valve is trileaflet. Mild valve calcification present. No stenosis. No stenosis by continuous wave doppler Trace regurgitation.  Mitral valve: Mild leaflet thickening is present. Trace regurgitation.  Right ventricle: Normal cavity size and ejection fraction. There is mild hypertrophy.  Tricuspid valve: Mild regurgitation. The tricuspid valve regurgitation jet is central.     Cardiac Catheterization:  06/05/16 Left Heart Cath and Coronary Angiography  Conclusion    Severe calcific, diffuse three-vessel coronary disease.  Codominant LAD/diagonal system with severe diffuse disease in each vessel with graftable distal targets.  Eccentric 30-60% ostial left main depending upon review.  99% proximal to mid RCA. Total occlusion at the ostium of a large PDA that fills left-to-right collaterals.  Moderate mid circumflex disease but without graftable targets beyond the stenosis.  Normal left ventricular systolic function with normal hemodynamics   RECOMMENDATIONS:   Continue current medical  regimen.  Expedient evaluation by [facility] for multivessel coronary bypass grafting.       ASSESSMENT & PLAN:   1. Near syncope/palpitations - in the setting of low potassium Resolved no high risk features continue beta blocker Can arrange monitor if recurs .   2. Known CAD with prior CABG x 3 in 2017 -  myovue 05/03/20 read as ischemia but to my review only breast attenuation no ischemia/infarct low risk   3. HLD - on statin LDL 84 10/20/19   4. HTN - Poorly controlled check BMET add aldactone , stop Kdur f/u labs 3 weeks after she starts    5. PVCs - on beta blocker - would continue. EF has been 50-55%   6. Hypokalemia - her HCTZ has been stopped - her ACE has been increased. K 4.6 06/08/20   Current medicines are reviewed with the patient today.  The patient does not have concerns regarding medicines other than what has been noted above.  The following changes have been made:  See above.  Labs/ tests ordered today include:   No orders of the defined types were placed in this encounter.    Disposition:   FU in 3 months     Patient is agreeable to this plan and will call if any problems develop in the interim.   Signed: Jenkins Rouge, MD  07/26/2020 8:09 AM  [facility] [address] [place], [place]  [address] Phone: [phone] Fax: [phone]

## 2020-07-19 ENCOUNTER — Encounter: Payer: Self-pay | Admitting: Family Medicine

## 2020-07-19 ENCOUNTER — Ambulatory Visit (INDEPENDENT_AMBULATORY_CARE_PROVIDER_SITE_OTHER): Payer: Medicare HMO | Admitting: Family Medicine

## 2020-07-19 ENCOUNTER — Other Ambulatory Visit: Payer: Self-pay

## 2020-07-19 VITALS — BP 160/96 | HR 86 | Temp 98.0°F | Ht 64.0 in | Wt 177.0 lb

## 2020-07-19 DIAGNOSIS — I10 Essential (primary) hypertension: Secondary | ICD-10-CM

## 2020-07-19 MED ORDER — NIFEDIPINE ER OSMOTIC RELEASE 30 MG PO TB24: 30.0000 mg | ORAL_TABLET | Freq: Every day | ORAL | 2 refills | Status: DC

## 2020-07-19 NOTE — Patient Instructions (Signed)
How to Take Your Blood Pressure Blood pressure measures how strongly your blood is pressing against the walls of your arteries. Arteries are blood vessels that carry blood from your heart throughout your body. You can take your blood pressure at home with a machine. You may need to check your blood pressure at home:  To check if you have high blood pressure (hypertension).  To check your blood pressure over time.  To make sure your blood pressure medicine is working. Supplies needed:  Blood pressure machine, or monitor.  Dining room chair to sit in.  Table or desk.  Small notebook.  Pencil or pen. How to prepare Avoid these things for 30 minutes before checking your blood pressure:  Having drinks with caffeine in them, such as coffee or tea.  Drinking alcohol.  Eating.  Smoking.  Exercising. Do these things five minutes before checking your blood pressure:  Go to the bathroom and pee (urinate).  Sit in a dining chair. Do not sit in a soft couch or an armchair.  Be quiet. Do not talk. How to take your blood pressure Follow the instructions that came with your machine. If you have a digital blood pressure monitor, these may be the instructions: 1. Sit up straight. 2. Place your feet on the floor. Do not cross your ankles or legs. 3. Rest your left arm at the level of your heart. You may rest it on a table, desk, or chair. 4. Pull up your shirt sleeve. 5. Wrap the blood pressure cuff around the upper part of your left arm. The cuff should be 1 inch (2.5 cm) above your elbow. It is best to wrap the cuff around bare skin. 6. Fit the cuff snugly around your arm. You should be able to place only one finger between the cuff and your arm. 7. Place the cord so that it rests in the bend of your elbow. 8. Press the power button. 9. Sit quietly while the cuff fills with air and loses air. 10. Write down the numbers on the screen. 11. Wait 2-3 minutes and then repeat steps 1-10.    What do the numbers mean? Two numbers make up your blood pressure. The first number is called systolic pressure. The second is called diastolic pressure. An example of a blood pressure reading is "120 over 80" (or 120/80). If you are an adult and do not have a medical condition, use this guide to find out if your blood pressure is normal: Normal  First number: below 120.  Second number: below 80. Elevated  First number: 120-129.  Second number: below 80. Hypertension stage 1  First number: 130-139.  Second number: 80-89. Hypertension stage 2  First number: 140 or above.  Second number: 90 or above. Your blood pressure is above normal even if only the top or bottom number is above normal. Follow these instructions at home:  Check your blood pressure as often as your doctor tells you to.  Check your blood pressure at the same time every day.  Take your monitor to your next doctor's appointment. Your doctor will: ? Make sure you are using it correctly. ? Make sure it is working right.  Make sure you understand what your blood pressure numbers should be.  Tell your doctor if your medicine is causing side effects.  Keep all follow-up visits as told by your doctor. This is important. General tips:  You will need a blood pressure machine, or monitor. Your doctor can suggest a   monitor. You can buy one at a drugstore or online. When choosing one: ? Choose one with an arm cuff. ? Choose one that wraps around your upper arm. Only one finger should fit between your arm and the cuff. ? Do not choose one that measures your blood pressure from your wrist or finger. Where to find more information American Heart Association: www.heart.org Contact a doctor if:  Your blood pressure keeps being high. Get help right away if:  Your first blood pressure number is higher than 180.  Your second blood pressure number is higher than 120. Summary  Check your blood pressure at the same  time every day.  Avoid caffeine, alcohol, smoking, and exercise for 30 minutes before checking your blood pressure.  Make sure you understand what your blood pressure numbers should be. This information is not intended to replace advice given to you by your health care provider. Make sure you discuss any questions you have with your health care provider. Document Revised: 06/10/2019 Document Reviewed: 06/10/2019 Elsevier Patient Education  2021 [place] Your Hypertension Hypertension, also called high blood pressure, is when the force of the blood pressing against the walls of the arteries is too strong. Arteries are blood vessels that carry blood from your heart throughout your body. Hypertension forces the heart to work harder to pump blood and may cause the arteries to become narrow or stiff. Understanding blood pressure readings Your personal target blood pressure may vary depending on your medical conditions, your age, and other factors. A blood pressure reading includes a higher number over a lower number. Ideally, your blood pressure should be below 120/80. You should know that:  The first, or top, number is called the systolic pressure. It is a measure of the pressure in your arteries as your heart beats.  The second, or bottom number, is called the diastolic pressure. It is a measure of the pressure in your arteries as the heart relaxes. Blood pressure is classified into four stages. Based on your blood pressure reading, your health care provider may use the following stages to determine what type of treatment you need, if any. Systolic pressure and diastolic pressure are measured in a unit called mmHg. Normal  Systolic pressure: below 811.  Diastolic pressure: below 80. Elevated  Systolic pressure: 914-782.  Diastolic pressure: below 80. Hypertension stage 1  Systolic pressure: 956-213.  Diastolic pressure: 08-65. Hypertension stage 2  Systolic pressure:  784 or above.  Diastolic pressure: 90 or above. How can this condition affect me? Managing your hypertension is an important responsibility. Over time, hypertension can damage the arteries and decrease blood flow to important parts of the body, including the brain, heart, and kidneys. Having untreated or uncontrolled hypertension can lead to:  A heart attack.  A stroke.  A weakened blood vessel (aneurysm).  Heart failure.  Kidney damage.  Eye damage.  Metabolic syndrome.  Memory and concentration problems.  Vascular dementia. What actions can I take to manage this condition? Hypertension can be managed by making lifestyle changes and possibly by taking medicines. Your health care provider will help you make a plan to bring your blood pressure within a normal range. Nutrition  Eat a diet that is high in fiber and potassium, and low in salt (sodium), added sugar, and fat. An example eating plan is called the Dietary Approaches to Stop Hypertension (DASH) diet. To eat this way: ? Eat plenty of fresh fruits and vegetables. Try to fill one-half of your  plate at each meal with fruits and vegetables. ? Eat whole grains, such as whole-wheat pasta, brown rice, or whole-grain bread. Fill about one-fourth of your plate with whole grains. ? Eat low-fat dairy products. ? Avoid fatty cuts of meat, processed or cured meats, and poultry with skin. Fill about one-fourth of your plate with lean proteins such as fish, chicken without skin, beans, eggs, and tofu. ? Avoid pre-made and processed foods. These tend to be higher in sodium, added sugar, and fat.  Reduce your daily sodium intake. Most people with hypertension should eat less than 1,500 mg of sodium a day.   Lifestyle  Work with your health care provider to maintain a healthy body weight or to lose weight. Ask what an ideal weight is for you.  Get at least 30 minutes of exercise that causes your heart to beat faster (aerobic exercise)  most days of the week. Activities may include walking, swimming, or biking.  Include exercise to strengthen your muscles (resistance exercise), such as weight lifting, as part of your weekly exercise routine. Try to do these types of exercises for 30 minutes at least 3 days a week.  Do not use any products that contain nicotine or tobacco, such as cigarettes, e-cigarettes, and chewing tobacco. If you need help quitting, ask your health care provider.  Control any long-term (chronic) conditions you have, such as high cholesterol or diabetes.  Identify your sources of stress and find ways to manage stress. This may include meditation, deep breathing, or making time for fun activities.   Alcohol use  Do not drink alcohol if: ? Your health care provider tells you not to drink. ? You are pregnant, may be pregnant, or are planning to become pregnant.  If you drink alcohol: ? Limit how much you use to:  0-1 drink a day for women.  0-2 drinks a day for men. ? Be aware of how much alcohol is in your drink. In the [place] one drink equals one 12 oz bottle of beer (355 mL), one 5 oz glass of wine (148 mL), or one 1 oz glass of hard liquor (44 mL). Medicines Your health care provider may prescribe medicine if lifestyle changes are not enough to get your blood pressure under control and if:  Your systolic blood pressure is 130 or higher.  Your diastolic blood pressure is 80 or higher. Take medicines only as told by your health care provider. Follow the directions carefully. Blood pressure medicines must be taken as told by your health care provider. The medicine does not work as well when you skip doses. Skipping doses also puts you at risk for problems. Monitoring Before you monitor your blood pressure:  Do not smoke, drink caffeinated beverages, or exercise within 30 minutes before taking a measurement.  Use the bathroom and empty your bladder (urinate).  Sit quietly for at least 5 minutes  before taking measurements. Monitor your blood pressure at home as told by your health care provider. To do this:  Sit with your back straight and supported.  Place your feet flat on the floor. Do not cross your legs.  Support your arm on a flat surface, such as a table. Make sure your upper arm is at heart level.  Each time you measure, take two or three readings one minute apart and record the results. You may also need to have your blood pressure checked regularly by your health care provider.   General information  Talk with your health care  provider about your diet, exercise habits, and other lifestyle factors that may be contributing to hypertension.  Review all the medicines you take with your health care provider because there may be side effects or interactions.  Keep all visits as told by your health care provider. Your health care provider can help you create and adjust your plan for managing your high blood pressure. Where to find more information  National Heart, Lung, and Blood Institute: https://wilson-eaton.com/  American Heart Association: www.heart.org Contact a health care provider if:  You think you are having a reaction to medicines you have taken.  You have repeated (recurrent) headaches.  You feel dizzy.  You have swelling in your ankles.  You have trouble with your vision. Get help right away if:  You develop a severe headache or confusion.  You have unusual weakness or numbness, or you feel faint.  You have severe pain in your chest or abdomen.  You vomit repeatedly.  You have trouble breathing. These symptoms may represent a serious problem that is an emergency. Do not wait to see if the symptoms will go away. Get medical help right away. Call your local emergency services (911 in the [place] Do not drive yourself to the hospital. Summary  Hypertension is when the force of blood pumping through your arteries is too strong. If this condition is not  controlled, it may put you at risk for serious complications.  Your personal target blood pressure may vary depending on your medical conditions, your age, and other factors. For most people, a normal blood pressure is less than 120/80.  Hypertension is managed by lifestyle changes, medicines, or both.  Lifestyle changes to help manage hypertension include losing weight, eating a healthy, low-sodium diet, exercising more, stopping smoking, and limiting alcohol. This information is not intended to replace advice given to you by your health care provider. Make sure you discuss any questions you have with your health care provider. Document Revised: 07/22/2019 Document Reviewed: 05/17/2019 Elsevier Patient Education  2021 [place]. Nifedipine Extended-Release Oral Tablets What is this medicine? NIFEDIPINE (nye FED i peen) is a calcium channel blocker. It relaxes your blood vessels and decreases the amount of work the heart has to do. It treats high blood pressure and/or prevents chest pain (also called angina). This medicine may be used for other purposes; ask your health care provider or pharmacist if you have questions. COMMON BRAND NAME(S): Adalat CC, Afeditab CR, Nifediac CC, Nifedical XL, Procardia XL What should I tell my health care provider before I take this medicine? They need to know if you have any of these conditions:  blockage in your bowels  constipation  heart attack  heart disease  heart failure  liver disease  low blood pressure  an unusual or allergic reaction to nifedipine, other drugs, foods, dyes or preservatives  pregnant or trying to get pregnant  breast-feeding How should I use this medicine? Take this drug by mouth. Take it as directed on the prescription label at the same time every day. Do not cut, crush or chew this drug. Swallow the tablets whole. Some tablets need to be taken on an empty stomach. Ask your pharmacist or health care provider if you  have any questions. Keep taking it unless your health care provider tells you to stop. Do not take this drug with grapefruit juice. Talk to your health care provider about the use of this drug in children. Special care may be needed. Overdosage: If you think you have  taken too much of this medicine contact a poison control center or emergency room at once. NOTE: This medicine is only for you. Do not share this medicine with others. What if I miss a dose? If you miss a dose, take it as soon as you can. If it is almost time for your next dose, take only that dose. Do not take double or extra doses. What may interact with this medicine? Do not take this medicine with any of the following medications:  certain medicines for seizures like carbamazepine, phenobarbital, phenytoin  lumacaftor; ivacaftor  rifabutin  rifampin  rifapentine  St. John's Wort This medicine may also interact with the following medications:  antiviral medicines for HIV or AIDS  certain medicines for blood pressure  certain medicines for diabetes  certain medicines for erectile dysfunction  certain medicines for fungal infections like ketoconazole, fluconazole, and itraconazole  certain medicines for irregular heart beat like flecainide and quinidine  certain medicines that treat or prevent blood clots like warfarin  clarithromycin  digoxin  dolasetron  erythromycin  fluoxetine  grapefruit juice  local or general anesthetics  nefazodone  orlistat  quinupristin; dalfopristin  sirolimus  stomach acid blockers like cimetidine, ranitidine, omeprazole, or pantoprazole  tacrolimus  valproic acid This list may not describe all possible interactions. Give your health care provider a list of all the medicines, herbs, non-prescription drugs, or dietary supplements you use. Also tell them if you smoke, drink alcohol, or use illegal drugs. Some items may interact with your medicine. What should I  watch for while using this medicine? Visit your health care provider for regular checks on your progress. Check your blood pressure as directed. Ask your health care provider what your blood pressure should be. Also, find out when you should contact him or her. Do not treat yourself for coughs, colds, or pain while you are using this drug without asking your health care provider for advice. Some drugs may increase your blood pressure. You may get drowsy or dizzy. Do not drive, use machinery, or do anything that needs mental alertness until you know how this drug affects you. Do not stand up or sit up quickly, especially if you are an older patient. This reduces the risk of dizzy or fainting spells. The tablet shell for some brands of this drug does not dissolve. This is normal. The tablet shell may appear whole in the stool. This is not a cause for concern. What side effects may I notice from receiving this medicine? Side effects that you should report to your doctor or health care provider as soon as possible:  allergic reactions (skin rash, itching or hives; swelling of the face, lips, or tongue)  heart attack (trouble breathing; pain or tightness in the chest, neck, back or arms; unusually weak or tired)  heart failure (trouble breathing; fast, irregular heartbeat; sudden weight gain; swelling of the ankles, feet, hands; unusually weak or tired)  low blood pressure (dizziness; feeling faint or lightheaded, falls; unusually weak or tired) Side effects that usually do not require medical attention (report to your doctor or health care provider if they continue or are bothersome):  bloating  changes in emotions or moods  constipation  facial flushing  headache  nasal congestion (like runny or stuffy nose)  nausea  stomach pain This list may not describe all possible side effects. Call your doctor for medical advice about side effects. You may report side effects to FDA at  1-800-FDA-1088. Where should I keep my  medicine? Keep out of the reach of children and pets. Store at room temperature between 20 and 25 degrees C (68 and 77 degrees F). Protect from light and moisture. Keep the container tightly closed. Throw away any unused drug after the expiration date. NOTE: This sheet is a summary. It may not cover all possible information. If you have questions about this medicine, talk to your doctor, pharmacist, or health care provider.  2021 Elsevier/Gold Standard (2019-04-12 08:24:11)

## 2020-07-19 NOTE — Progress Notes (Signed)
Subjective:    Patient ID: Donna Woodward, female    DOB: 03-18-1956, 65 y.o.   MRN: [id number]  No chief complaint on file.   HPI Patient is a 65 yo female with pmh sig for HTN, HLD, h/o depression, h/o anxiety, CAD s/p CABG x 3 who was seen today for follow-up on blood pressure.  Patient taking Toprol-XL 100 mg and lisinopril 40 mg daily.  BP with new home cuff 162/97, 179/103, 162/94, 105/84, 133/81, 119/62, 167/90, 150/80, 125/83, 134/78, 156/84, 127/77, 168/88, 147/83 with pulse 40-77.    Past Medical History:  Diagnosis Date  . Anxiety   . Back pain   . Coronary artery disease    CABGx3; Seen by Dr. Johnsie Cancel  . Degenerative joint disease (DJD) of lumbar spine 03/24/2016  . Depression   . GERD (gastroesophageal reflux disease)    if overweight  . Hyperlipidemia   . Hypertension     Allergies  Allergen Reactions  . Bee Venom Swelling and Other (See Comments)    Swells where stung- no breathing issues    ROS General: Denies fever, chills, night sweats, changes in weight, changes in appetite HEENT: Denies headaches, ear pain, changes in vision, rhinorrhea, sore throat CV: Denies CP, palpitations, SOB, orthopnea Pulm: Denies SOB, cough, wheezing GI: Denies abdominal pain, nausea, vomiting, diarrhea, constipation GU: Denies dysuria, hematuria, frequency, vaginal discharge Msk: Denies muscle cramps, joint pains Neuro: Denies weakness, numbness, tingling Skin: Denies rashes, bruising Psych: Denies depression, anxiety, hallucinations     Objective:    Blood pressure (!) 160/96, pulse 86, temperature 98 F (36.7 C), temperature source Oral, height 5\' 4"  (1.626 m), weight 177 lb (80.3 kg), SpO2 96 %.  Gen. Pleasant, well-nourished, in no distress, normal affect  HEENT: [place]/AT, face symmetric, conjunctiva clear, no scleral icterus, PERRLA, EOMI, nares patent without drainage Lungs: no accessory muscle use, CTAB, no wheezes or rales Cardiovascular: RRR, no m/r/g, no peripheral  edema Musculoskeletal: No deformities, no cyanosis or clubbing, normal tone Neuro:  A&Ox3, CN II-XII intact, normal gait Skin:  Warm, no lesions/ rash  Wt Readings from Last 3 Encounters:  07/19/20 177 lb (80.3 kg)  06/08/20 176 lb 9.6 oz (80.1 kg)  05/08/20 176 lb (79.8 kg)    Lab Results  Component Value Date   WBC 8.2 05/03/2020   HGB 13.8 05/03/2020   HCT 42.1 05/03/2020   PLT 195 05/03/2020   GLUCOSE 89 06/08/2020   CHOL 137 10/20/2019   TRIG 77.0 10/20/2019   HDL 37.20 (L) 10/20/2019   LDLCALC 84 10/20/2019   ALT 18 05/03/2020   AST 15 05/03/2020   NA 141 06/08/2020   K 4.6 06/08/2020   CL 103 06/08/2020   CREATININE 0.72 06/08/2020   BUN 14 06/08/2020   CO2 31 06/08/2020   TSH 1.65 10/20/2019   INR 1.23 06/17/2016   HGBA1C 5.8 10/20/2019    Assessment/Plan:  Essential hypertension  -uncontrolled -liable home readings -continue lifestyle modifications -will start procardia xl 30 mg daily -continue lisinopril 40 mg, toprol xl 100 mg -continue checking bp at home and keeping a log -encouraged to keep f/u with Cardiology - Plan: NIFEdipine (PROCARDIA-XL/NIFEDICAL-XL) 30 MG 24 hr tablet  F/u in 2-4 wks  Grier Mitts, MD

## 2020-07-20 ENCOUNTER — Ambulatory Visit: Payer: Medicare HMO | Admitting: Family Medicine

## 2020-07-23 ENCOUNTER — Encounter: Payer: Self-pay | Admitting: Family Medicine

## 2020-07-26 ENCOUNTER — Encounter: Payer: Self-pay | Admitting: Cardiovascular Disease

## 2020-07-26 ENCOUNTER — Other Ambulatory Visit: Payer: Self-pay

## 2020-07-26 ENCOUNTER — Ambulatory Visit: Payer: Medicare HMO | Admitting: Cardiovascular Disease

## 2020-07-26 VITALS — BP 164/96 | HR 73 | Ht 64.0 in | Wt 175.0 lb

## 2020-07-26 DIAGNOSIS — Z951 Presence of aortocoronary bypass graft: Secondary | ICD-10-CM

## 2020-07-26 DIAGNOSIS — I10 Essential (primary) hypertension: Secondary | ICD-10-CM

## 2020-07-26 LAB — BASIC METABOLIC PANEL
BUN/Creatinine Ratio: 20 (ref 12–28)
BUN: 15 mg/dL (ref 8–27)
CO2: 25 mmol/L (ref 20–29)
Calcium: 10.2 mg/dL (ref 8.7–10.3)
Chloride: 102 mmol/L (ref 96–106)
Creatinine, Ser: 0.75 mg/dL (ref 0.57–1.00)
GFR calc Af Amer: 97 mL/min/{1.73_m2} (ref >59)
GFR calc non Af Amer: 85 mL/min/{1.73_m2} (ref >59)
Glucose: 97 mg/dL (ref 65–99)
Potassium: 4.1 mmol/L (ref 3.5–5.2)
Sodium: 141 mmol/L (ref 134–144)

## 2020-07-26 MED ORDER — SPIRONOLACTONE 25 MG PO TABS: 12.5000 mg | ORAL_TABLET | Freq: Every day | ORAL | 3 refills | Status: DC

## 2020-07-26 NOTE — Patient Instructions (Signed)
Medication Instructions:  Your physician has recommended you make the following change in your medication:  1-START aldactone 12.5 mg by mouth daily 2-STOP potassium  *If you need a refill on your cardiac medications before your next appointment, please call your pharmacy*  Lab Work: Your physician recommends that you have lab work today. BMET  Please call our office when you start Aldactone so we can schedule you an appointment for another BMET in 3 weeks.  If you have labs (blood work) drawn today and your tests are completely normal, you will receive your results only by: Marland Kitchen MyChart Message (if you have MyChart) OR . A paper copy in the mail If you have any lab test that is abnormal or we need to change your treatment, we will call you to review the results.  Testing/Procedures: None ordered today.  Follow-Up: At [facility], you and your health needs are our priority.  As part of our continuing mission to provide you with exceptional heart care, we have created designated Provider Care Teams.  These Care Teams include your primary Cardiologist (physician) and Advanced Practice Providers (APPs -  Physician Assistants and Nurse Practitioners) who all work together to provide you with the care you need, when you need it.  We recommend signing up for the patient portal called "MyChart".  Sign up information is provided on this After Visit Summary.  MyChart is used to connect with patients for Virtual Visits (Telemedicine).  Patients are able to view lab/test results, encounter notes, upcoming appointments, etc.  Non-urgent messages can be sent to your provider as well.   To learn more about what you can do with MyChart, go to [email or web address].    Your next appointment:   3 month(s)  The format for your next appointment:   In Person  Provider:   You may see Jenkins Rouge, MD or one of the following Advanced Practice Providers on your designated Care Team:    Kathyrn Drown,  NP

## 2020-07-28 ENCOUNTER — Encounter: Payer: Self-pay | Admitting: Family Medicine

## 2020-07-31 ENCOUNTER — Encounter: Payer: Self-pay | Admitting: Family Medicine

## 2020-08-14 ENCOUNTER — Other Ambulatory Visit: Payer: Self-pay

## 2020-08-14 MED ORDER — METOPROLOL SUCCINATE ER 100 MG PO TB24: ORAL_TABLET | ORAL | 1 refills | Status: DC

## 2020-08-16 ENCOUNTER — Other Ambulatory Visit: Payer: Medicare HMO

## 2020-08-20 ENCOUNTER — Encounter: Payer: Self-pay | Admitting: Family Medicine

## 2020-08-20 ENCOUNTER — Other Ambulatory Visit: Payer: Self-pay

## 2020-08-20 ENCOUNTER — Other Ambulatory Visit: Payer: Medicare HMO | Admitting: *Deleted

## 2020-08-20 ENCOUNTER — Ambulatory Visit (INDEPENDENT_AMBULATORY_CARE_PROVIDER_SITE_OTHER): Payer: Medicare HMO | Admitting: Family Medicine

## 2020-08-20 VITALS — BP 142/90 | HR 67 | Temp 98.4°F | Ht 64.0 in | Wt 175.4 lb

## 2020-08-20 DIAGNOSIS — M545 Low back pain, unspecified: Secondary | ICD-10-CM

## 2020-08-20 DIAGNOSIS — I10 Essential (primary) hypertension: Secondary | ICD-10-CM

## 2020-08-20 DIAGNOSIS — R0982 Postnasal drip: Secondary | ICD-10-CM

## 2020-08-20 DIAGNOSIS — G8929 Other chronic pain: Secondary | ICD-10-CM

## 2020-08-20 DIAGNOSIS — Z79899 Other long term (current) drug therapy: Secondary | ICD-10-CM

## 2020-08-20 DIAGNOSIS — Z951 Presence of aortocoronary bypass graft: Secondary | ICD-10-CM | POA: Diagnosis not present

## 2020-08-20 LAB — BASIC METABOLIC PANEL
BUN/Creatinine Ratio: 20 (ref 12–28)
BUN: 14 mg/dL (ref 8–27)
CO2: 24 mmol/L (ref 20–29)
Calcium: 9.3 mg/dL (ref 8.7–10.3)
Chloride: 101 mmol/L (ref 96–106)
Creatinine, Ser: 0.71 mg/dL (ref 0.57–1.00)
GFR calc Af Amer: 104 mL/min/{1.73_m2} (ref >59)
GFR calc non Af Amer: 90 mL/min/{1.73_m2} (ref >59)
Glucose: 77 mg/dL (ref 65–99)
Potassium: 4 mmol/L (ref 3.5–5.2)
Sodium: 139 mmol/L (ref 134–144)

## 2020-08-20 MED ORDER — FEXOFENADINE HCL 180 MG PO TABS: 180.0000 mg | ORAL_TABLET | Freq: Every day | ORAL | 5 refills | Status: DC

## 2020-08-20 NOTE — Progress Notes (Signed)
Subjective:    Patient ID: Donna Woodward, female    DOB: 17-May-1956, 65 y.o.   MRN: [id number]  Chief Complaint  Patient presents with  . Follow-up    BP    HPI Patient was seen today for follow-up on blood pressure and acute concern.  Patient endorses some improvement in BP, no longer having headaches.  Blood pressure is lower in the afternoon and first thing in the morning.  BP 165/98, 139/89, 131/65, 160/96, 124/81, 132/92, 129/80, 128/81, 110/72.  Patient taking metoprolol succinate, spironolactone, Procardia, lisinopril.  Also followed by cardiology.  Patient endorses back pain x1 week after twisting while putting dishes away.  Patient endorses shooting pain up back.  Taking Flexeril 5 mg as needed.  Notes some improvement.  Endorses intermittent cough and postnasal drainage.  Taking OTC guaifensin daily.  Past Medical History:  Diagnosis Date  . Anxiety   . Back pain   . Coronary artery disease    CABGx3; Seen by Dr. Johnsie Cancel  . Degenerative joint disease (DJD) of lumbar spine 03/24/2016  . Depression   . GERD (gastroesophageal reflux disease)    if overweight  . Hyperlipidemia   . Hypertension     Allergies  Allergen Reactions  . Bee Venom Swelling and Other (See Comments)    Swells where stung- no breathing issues    ROS General: Denies fever, chills, night sweats, changes in weight, changes in appetite HEENT: Denies headaches, ear pain, changes in vision, rhinorrhea, sore throat CV: Denies CP, palpitations, SOB, orthopnea Pulm: Denies SOB, cough, wheezing GI: Denies abdominal pain, nausea, vomiting, diarrhea, constipation GU: Denies dysuria, hematuria, frequency, vaginal discharge Msk: Denies muscle cramps, joint pains + low back pain Neuro: Denies weakness, numbness, tingling Skin: Denies rashes, bruising Psych: Denies depression, anxiety, hallucinations     Objective:    Blood pressure (!) 142/90, pulse 67, temperature 98.4 F (36.9 C), temperature source  Oral, height 5\' 4"  (1.626 m), weight 175 lb 6.4 oz (79.6 kg), SpO2 97 %.   Gen. Pleasant, well-nourished, in no distress, normal affect HEENT: [place]/AT, face symmetric, conjunctiva clear, no scleral icterus, PERRLA, EOMI, nares patent without drainage Lungs: no accessory muscle use Cardiovascular: RRR, no m/r/g, no peripheral edema Musculoskeletal: No deformities, no cyanosis or clubbing, normal tone Neuro:  A&Ox3, CN II-XII intact, ambulating with a cane Skin:  Warm, no lesions/ rash   Wt Readings from Last 3 Encounters:  08/20/20 175 lb 6.4 oz (79.6 kg)  07/26/20 175 lb (79.4 kg)  07/19/20 177 lb (80.3 kg)    Lab Results  Component Value Date   WBC 8.2 05/03/2020   HGB 13.8 05/03/2020   HCT 42.1 05/03/2020   PLT 195 05/03/2020   GLUCOSE 97 07/26/2020   CHOL 137 10/20/2019   TRIG 77.0 10/20/2019   HDL 37.20 (L) 10/20/2019   LDLCALC 84 10/20/2019   ALT 18 05/03/2020   AST 15 05/03/2020   NA 141 07/26/2020   K 4.1 07/26/2020   CL 102 07/26/2020   CREATININE 0.75 07/26/2020   BUN 15 07/26/2020   CO2 25 07/26/2020   TSH 1.65 10/20/2019   INR 1.23 06/17/2016   HGBA1C 5.8 10/20/2019    Assessment/Plan:  Essential hypertension -elevated, but improving -Continue lifestyle modifications -Continue metoprolol succinate 100 mg, spironolactone 12.5 mg daily, nifedipine XL 30 mg, lisinopril 40 mg daily -Continue checking BP at home and keep a log to bring with you to clinic -Continue follow-up with cardiology  Chronic bilateral low back pain  without sciatica  - Plan: Ambulatory referral to Physical Therapy  Acute midline low back pain without sciatica  - Plan: Ambulatory referral to Physical Therapy  Post-nasal drainage  - Plan: fexofenadine (ALLEGRA ALLERGY) 180 MG tablet  F/u prn in 1 month, sooner if needed  Grier Mitts, MD

## 2020-08-20 NOTE — Progress Notes (Signed)
Placed order for BMET.  

## 2020-08-24 ENCOUNTER — Other Ambulatory Visit: Payer: Self-pay

## 2020-08-24 DIAGNOSIS — I10 Essential (primary) hypertension: Secondary | ICD-10-CM

## 2020-08-24 MED ORDER — NIFEDIPINE ER OSMOTIC RELEASE 30 MG PO TB24: 30.0000 mg | ORAL_TABLET | Freq: Every day | ORAL | 2 refills | Status: DC

## 2020-09-03 ENCOUNTER — Other Ambulatory Visit: Payer: Self-pay | Admitting: Cardiovascular Disease

## 2020-09-27 ENCOUNTER — Other Ambulatory Visit: Payer: Self-pay

## 2020-09-27 ENCOUNTER — Encounter: Payer: Self-pay | Admitting: Physical Therapy

## 2020-09-27 ENCOUNTER — Ambulatory Visit: Payer: Medicare HMO | Attending: Family Medicine | Admitting: Physical Therapy

## 2020-09-27 DIAGNOSIS — G8929 Other chronic pain: Secondary | ICD-10-CM | POA: Insufficient documentation

## 2020-09-27 DIAGNOSIS — M545 Low back pain, unspecified: Secondary | ICD-10-CM | POA: Insufficient documentation

## 2020-09-27 DIAGNOSIS — M6281 Muscle weakness (generalized): Secondary | ICD-10-CM | POA: Diagnosis not present

## 2020-09-27 NOTE — Therapy (Signed)
[facility] [address], [place], [place], [address] Phone: [phone]   Fax:  [phone]  Physical Therapy Evaluation  Patient Details  Name: Donna Woodward MRN: [id number] Date of Birth: [birth year] Referring Provider (PT): Dr. Grier Mitts   Encounter Date: 09/27/2020   PT End of Session - 09/27/20 2033    Visit Number 1    Number of Visits 10    Date for PT Re-Evaluation 11/22/20    Authorization Type [employer] 10 visits    PT Start Time 1015    PT Stop Time 1100    PT Time Calculation (min) 45 min    Activity Tolerance Patient limited by pain           Past Medical History:  Diagnosis Date  . Anxiety   . Back pain   . Coronary artery disease    CABGx3; Seen by Dr. Johnsie Cancel  . Degenerative joint disease (DJD) of lumbar spine 03/24/2016  . Depression   . GERD (gastroesophageal reflux disease)    if overweight  . Hyperlipidemia   . Hypertension     Past Surgical History:  Procedure Laterality Date  . BACK SURGERY    . CARDIAC CATHETERIZATION N/A 06/05/2016   Procedure: Left Heart Cath and Coronary Angiography;  Surgeon: Belva Crome, MD;  Location: [place] [facility];  Service: Cardiovascular;  Laterality: N/A;  . CORONARY ARTERY BYPASS GRAFT N/A 06/17/2016   Procedure: CORONARY ARTERY BYPASS GRAFTING (CABG), ON PUMP, TIMES THREE, USING LEFT INTERNAL MAMMARY ARTERY, RIGHT GREATER SAPHENOUS VEIN HARVESTED ENDOSCOPICALLY;  Surgeon: Ivin Poot, MD;  Location: [place];  Service: Open Heart Surgery;  Laterality: N/A;  . MULTIPLE TOOTH EXTRACTIONS    . [place]   lamnectomy L5S1  . TEE WITHOUT CARDIOVERSION N/A 06/17/2016   Procedure: TRANSESOPHAGEAL ECHOCARDIOGRAM (TEE);  Surgeon: Ivin Poot, MD;  Location: [place];  Service: Open Heart Surgery;  Laterality: N/A;    There were no vitals filed for this visit.    Subjective Assessment - 09/27/20 1021    Subjective Back surgery for  ruptured HNP left LE symptoms '89;  it took 20 years for those muscles to relax; I can't stand up straight; had lots of therapy (I just do it to be done.) It's all painful.    Pertinent History bil tendonitis RTC; OA hands; CABG x3;  [facility]es [name]    Limitations Sitting;Lifting;House hold activities    How long can you sit comfortably? < 5 minutes    How long can you walk comfortably? 10 minutes with walking stick  (not indoors)    Diagnostic tests none recently    Patient Stated Goals see if I can get some relief;  garden; clean house; carry groceries 4 bags; I like to build things wood; fishing    Currently in Pain? Yes    Pain Score 4     Pain Location Back    Pain Orientation Right;Left;Lower   right > left; lateral hips   Pain Type Chronic pain    Pain Onset More than a month ago    Pain Frequency Constant    Aggravating Factors  lifting > 10#; cleaning the house; vacumning    Pain Relieving Factors lying down supine or sidelying              [facility] PT Assessment - 09/27/20 0001      Assessment   Medical Diagnosis low back pain without sciatica  Referring Provider (PT) Dr. Grier Mitts    Onset Date/Surgical Date --   > 1 year   Next MD Visit as needed    Prior Therapy lots over the years      Precautions   Precautions None      Restrictions   Weight Bearing Restrictions No      Balance Screen   Has the patient fallen in the past 6 months No    Has the patient had a decrease in activity level because of a fear of falling?  No    Is the patient reluctant to leave their home because of a fear of falling?  No      Home Environment   Living Environment Private residence    Living Arrangements Alone   dog   Type of Home Mobile home    Home Access Level entry   front has a few steps needed to let dog out     Prior [place] On disability   for back   Leisure I like to piddle; wood work      Observation/Other Assessments   Focus on Therapeutic Outcomes  (FOTO)  48%      AROM   Overall AROM Comments difficulty lying prone; painful KTC    Lumbar Flexion 35    Lumbar Extension 10    Lumbar - Right Side Bend 15    Lumbar - Left Side Bend 15      PROM   Overall PROM Comments painful bil hip internal and external rotation of hip      Strength   Overall Strength Comments needs UE assist with sit to stand from chair    Right Hip Flexion 4-/5    Right Hip ABduction 4-/5    Left Hip Flexion 4-/5    Left Hip ABduction 4-/5    Lumbar Flexion 4-/5    Lumbar Extension 4-/5      Flexibility   Hamstrings right HS 40; left HS 48    Quadriceps decreased bil hip flexor lengths      Palpation   Palpation comment decreased thoracolumbar fascial mobility; mild tender points in gluteals                      Objective measurements completed on examination: See above findings.               PT Education - 09/27/20 1100    Education Details basic info on Dn    Person(s) Educated Patient    Methods Explanation;Handout    Comprehension Verbalized understanding            PT Short Term Goals - 09/27/20 2100      PT SHORT TERM GOAL #1   Title Pt will be independent with initial HEP and perform consistently to maximize return to PLOF.    Time 4    Period Weeks    Target Date 10/25/20      PT SHORT TERM GOAL #2   Title Pt will have improved MMT to 4/5 throughout BLE to demosntrate improved overall strength to maximize function and gait.    Time 4    Period Weeks    Status New      PT SHORT TERM GOAL #3   Title Pt will report being able to wash dishes, vacumn or mop for 10-15 mins  showing improved tolerance to standing in order maximize participation at home.    Time 4  Period Weeks    Status New      PT SHORT TERM GOAL #4   Title ...      PT SHORT TERM GOAL #5   Title .Marland KitchenMarland Kitchen             PT Long Term Goals - 09/27/20 2102      PT LONG TERM GOAL #1   Title Pt will have improved 5xSTS to 15 sec or <  to demonstrate improved overall BLE strength and power in order to maximize functional tasks at home and in the community.    Time 8    Period Weeks    Status New    Target Date 11/22/20      PT LONG TERM GOAL #2   Title Pt will have improved lumbar flexion and extension ROM by 50% to demonstrate improved function for gardening, fishing and woodwork and decrease pain overall.    Time 8    Period Weeks    Status New      PT LONG TERM GOAL #3   Title Pt will have improved TUG to 16 sec or < with LRAD in order to demonstrate improved overall function and maximzie participation at home and in the community.    Time 8    Period Weeks    Status On-going      PT LONG TERM GOAL #4   Title FOTO functional outcome score improved to 52%    Time 8    Period Weeks    Status New      PT LONG TERM GOAL #5   Title Trunk and LE strength grossly 4+/5 needed to rise from a chair without UE assist and greater ease negotiating steps and curbs    Time 8    Period Weeks    Status New                  Plan - 09/27/20 2034    Clinical Impression Statement The patient has a 30 year history of back pain and is s/p lumbar surgery (non fusion) in 1989 or '90.  She has had multiple rounds of PT in the past with reports that it was all painful.  She is not optimistic about getting better but is hopeful that she might get some relief and improve her ability to garden, fish and do some wood working.  Limited and painful lumbar ROM in all planes.  Painful and limited hip rotation. Decreased activation of transverse abdominus muscles, trunk extensors, hip abductors and flexors grossly 4-/5.  She requires UE assist with rising from a standard height chair.  Decreased HS length and hip flexor length.  High symptom irritability.  She would benefit from PT to address these deficits and improve quality of life by helping her return to fishing, gardening and wood working.    Personal Factors and Comorbidities  Past/Current Experience;Time since onset of injury/illness/exacerbation;Comorbidity 1;Comorbidity 2;Comorbidity 3+    Comorbidities Cardiac surgery; previous back surgery; multi region OA; HTN; anxiety    Examination-Activity Limitations Bed Mobility;Locomotion Level;Carry;Lift;Stand;Stairs    Examination-Participation Restrictions Meal Prep;Cleaning;Community Activity;Yard Work;Laundry;Shop    Stability/Clinical Decision Making Evolving/Moderate complexity    Clinical Decision Making Moderate    Rehab Potential Good    PT Frequency 2x / week    PT Duration 8 weeks    PT Treatment/Interventions ADLs/Self Care Home Management;Aquatic Therapy;Cryotherapy;Electrical Stimulation;Ultrasound;Moist Heat;Therapeutic activities;Therapeutic exercise;Neuromuscular re-education;Manual techniques;Patient/family education;Taping;Dry needling    PT Next Visit Plan check 5x sit to stand test  and TUG baselines;  initiate basic ex very low level secondary to high irritability; possible DN if pt able to lie prone; functional ex to help return to gardening    Consulted and Agree with Plan of Care Patient           Patient will benefit from skilled therapeutic intervention in order to improve the following deficits and impairments:  Decreased range of motion,Increased fascial restricitons,Pain,Decreased strength,Decreased activity tolerance,Impaired flexibility  Visit Diagnosis: Chronic bilateral low back pain without sciatica - Plan: PT plan of care cert/re-cert  Muscle weakness (generalized) - Plan: PT plan of care cert/re-cert     Problem List Patient Active Problem List   Diagnosis Date Noted  . Near syncope 05/02/2020  . Anxiety 11/06/2017  . S/P CABG x 3 06/17/2016  . Family history of early CAD 06/04/2016  . Chest pain 06/04/2016  . Abnormal nuclear stress test 06/04/2016  . Degenerative joint disease (DJD) of lumbar spine 03/24/2016  . Depression 03/24/2016  . Obesity 08/14/2015  .  Essential hypertension, benign 04/29/2015  . Hyperlipidemia 04/29/2015   Ruben Im, PT 09/27/20 9:10 PM Phone: [phone] Fax: [phone] Alvera Singh 09/27/2020, 9:09 PM  [facility] [address] [place], [place], [address] Phone: [phone]   Fax:  [phone]  Name: Donna Woodward MRN: [id number] Date of Birth: May 07, 1956

## 2020-10-02 ENCOUNTER — Ambulatory Visit: Payer: Medicare HMO | Attending: Family Medicine | Admitting: Physical Therapy

## 2020-10-02 ENCOUNTER — Other Ambulatory Visit: Payer: Self-pay

## 2020-10-02 DIAGNOSIS — M545 Low back pain, unspecified: Secondary | ICD-10-CM | POA: Insufficient documentation

## 2020-10-02 DIAGNOSIS — M6281 Muscle weakness (generalized): Secondary | ICD-10-CM | POA: Diagnosis not present

## 2020-10-02 DIAGNOSIS — G8929 Other chronic pain: Secondary | ICD-10-CM | POA: Insufficient documentation

## 2020-10-02 NOTE — Patient Instructions (Addendum)
Trigger Point Dry Needling  . What is Trigger Point Dry Needling (DN)? o DN is a physical therapy technique used to treat muscle pain and dysfunction. Specifically, DN helps deactivate muscle trigger points (muscle knots).  o A thin filiform needle is used to penetrate the skin and stimulate the underlying trigger point. The goal is for a local twitch response (LTR) to occur and for the trigger point to relax. No medication of any kind is injected during the procedure.   . What Does Trigger Point Dry Needling Feel Like?  o The procedure feels different for each individual patient. Some patients report that they do not actually feel the needle enter the skin and overall the process is not painful. Very mild bleeding may occur. However, many patients feel a deep cramping in the muscle in which the needle was inserted. This is the local twitch response.   Marland Kitchen How Will I feel after the treatment? o Soreness is normal, and the onset of soreness may not occur for a few hours. Typically this soreness does not last longer than two days.  o Bruising is uncommon, however; ice can be used to decrease any possible bruising.  o In rare cases feeling tired or nauseous after the treatment is normal. In addition, your symptoms may get worse before they get better, this period will typically not last longer than 24 hours.   . What Can I do After My Treatment? o Increase your hydration by drinking more water for the next 24 hours. o You may place ice or heat on the areas treated that have become sore, however, do not use heat on inflamed or bruised areas. Heat often brings more relief post needling. o You can continue your regular activities, but vigorous activity is not recommended initially after the treatment for 24 hours. o DN is best combined with other physical therapy such as strengthening, stretching, and other therapies.   Access Code: [email or web address] URL: [email or web address] Date:  10/02/2020 Prepared by: Ruben Im  Exercises Hooklying Single Knee to Chest Stretch - 2 x daily - 7 x weekly - 1 sets - 5 reps - 5 hold Supine Lower Trunk Rotation - 2 x daily - 7 x weekly - 1 sets - 5 reps - 5 hold Supine Butterfly Groin Stretch - 2 x daily - 7 x weekly - 1 sets - 5 reps - 5 hold   [facility] [address], [place], [place] [address] Phone # [phone] Fax [phone]

## 2020-10-02 NOTE — Therapy (Signed)
[facility] [address], [place], [place], [address] Phone: [phone]   Fax:  [phone]  Physical Therapy Treatment  Patient Details  Name: Donna Woodward MRN: [id number] Date of Birth: 08/04/1955 Referring Provider (PT): Dr. Grier Mitts   Encounter Date: 10/02/2020   PT End of Session - 10/02/20 1726    Visit Number 2    Number of Visits 10    Date for PT Re-Evaluation 11/22/20    Authorization Type [employer] 10 visits    PT Start Time 0845    PT Stop Time 0930    PT Time Calculation (min) 45 min    Activity Tolerance Patient limited by pain           Past Medical History:  Diagnosis Date  . Anxiety   . Back pain   . Coronary artery disease    CABGx3; Seen by Dr. Johnsie Cancel  . Degenerative joint disease (DJD) of lumbar spine 03/24/2016  . Depression   . GERD (gastroesophageal reflux disease)    if overweight  . Hyperlipidemia   . Hypertension     Past Surgical History:  Procedure Laterality Date  . BACK SURGERY    . CARDIAC CATHETERIZATION N/A 06/05/2016   Procedure: Left Heart Cath and Coronary Angiography;  Surgeon: Belva Crome, MD;  Location: [facility];  Service: Cardiovascular;  Laterality: N/A;  . CORONARY ARTERY BYPASS GRAFT N/A 06/17/2016   Procedure: CORONARY ARTERY BYPASS GRAFTING (CABG), ON PUMP, TIMES THREE, USING LEFT INTERNAL MAMMARY ARTERY, RIGHT GREATER SAPHENOUS VEIN HARVESTED ENDOSCOPICALLY;  Surgeon: Ivin Poot, MD;  Location: [place];  Service: Open Heart Surgery;  Laterality: N/A;  . MULTIPLE TOOTH EXTRACTIONS    . [place]   lamnectomy L5S1  . TEE WITHOUT CARDIOVERSION N/A 06/17/2016   Procedure: TRANSESOPHAGEAL ECHOCARDIOGRAM (TEE);  Surgeon: Ivin Poot, MD;  Location: [facility];  Service: Open Heart Surgery;  Laterality: N/A;    There were no vitals filed for this visit.   Subjective Assessment - 10/02/20 0851    Subjective I didn't like you after  last time.  That poking and prodding bothered me so I had to lay down.  I've got to go mow this week, it's a zero turn mower.  I'll just have to lie down afterwards.  No assistive device today.    Pertinent History bil tendonitis RTC; OA hands; CABG x3;  goes by Donna Woodward    Limitations Sitting;Lifting;House hold activities    Patient Stated Goals see if I can get some relief;  garden; clean house; carry groceries 4 bags; I like to build things wood; fishing    Currently in Pain? Yes    Pain Score 5     Pain Location Back    Pain Type Chronic pain              [facility] PT Assessment - 10/02/20 0001      Standardized Balance Assessment   Five times sit to stand comments  needs UE assist 46.12 (1 rep without UE)      Timed Up and Go Test   Normal TUG (seconds) 22.3                         [facility] Adult PT Treatment/Exercise - 10/02/20 0001      Neuro Re-ed    Neuro Re-ed Details  recommend [employer] app for neuroscience of pain education  Lumbar Exercises: Aerobic   Nustep L1 3 min while discussing status and plan      Lumbar Exercises: Supine   Other Supine Lumbar Exercises initial HEP: SKTC, lumbar rotation, supine butterfly groin stretch      Moist Heat Therapy   Number Minutes Moist Heat 3 Minutes    Moist Heat Location Lumbar Spine      Manual Therapy   Soft tissue mobilization bil lumbar paraspinals            Trigger Point Dry Needling - 10/02/20 0001    Consent Given? Yes    Muscles Treated Back/Hip Lumbar multifidi    Other Dry Needling bil    Lumbar multifidi Response Palpable increased muscle length                PT Education - 10/02/20 1724    Education Details DN after care; [place]; lumbar rot; butterfly    Person(s) Educated Patient    Methods Explanation;Handout    Comprehension Verbalized understanding            PT Short Term Goals - 09/27/20 2100      PT SHORT TERM GOAL #1   Title Pt will be independent with initial HEP and  perform consistently to maximize return to PLOF.    Time 4    Period Weeks    Target Date 10/25/20      PT SHORT TERM GOAL #2   Title Pt will have improved MMT to 4/5 throughout BLE to demosntrate improved overall strength to maximize function and gait.    Time 4    Period Weeks    Status New      PT SHORT TERM GOAL #3   Title Pt will report being able to wash dishes, vacumn or mop for 10-15 mins  showing improved tolerance to standing in order maximize participation at home.    Time 4    Period Weeks    Status New      PT SHORT TERM GOAL #4   Title ...      PT SHORT TERM GOAL #5   Title .Marland KitchenMarland Kitchen             PT Long Term Goals - 09/27/20 2102      PT LONG TERM GOAL #1   Title Pt will have improved 5xSTS to 15 sec or < to demonstrate improved overall BLE strength and power in order to maximize functional tasks at home and in the community.    Time 8    Period Weeks    Status New    Target Date 11/22/20      PT LONG TERM GOAL #2   Title Pt will have improved lumbar flexion and extension ROM by 50% to demonstrate improved function for gardening, fishing and woodwork and decrease pain overall.    Time 8    Period Weeks    Status New      PT LONG TERM GOAL #3   Title Pt will have improved TUG to 16 sec or < with LRAD in order to demonstrate improved overall function and maximzie participation at home and in the community.    Time 8    Period Weeks    Status On-going      PT LONG TERM GOAL #4   Title FOTO functional outcome score improved to 52%    Time 8    Period Weeks    Status New      PT LONG TERM  GOAL #5   Title Trunk and LE strength grossly 4+/5 needed to rise from a chair without UE assist and greater ease negotiating steps and curbs    Time 8    Period Weeks    Status New                 Plan - 10/02/20 2094    Clinical Impression Statement The patient returns for first follow up after initial eval reporting extreme soreness from assessment  movements.   5x sit to stand  and Timed up and Go tests performed for baselines.  Her gait speed is quite slow and requires UE assist to rise for both tests.  Trial of manual therapy and DN performed in the prone position.  She is highly sensitive on the left lumbar paraspinals and multifidi.  She may benefit from more neuroscience of pain education.  Therapist monitoring response with all interventions.    Comorbidities Cardiac surgery; previous back surgery; multi region OA; HTN; anxiety    Examination-Participation Restrictions Meal Prep;Cleaning;Community Activity;Yard Work;Laundry;Shop    Stability/Clinical Decision Making Evolving/Moderate complexity    Rehab Potential Good    PT Frequency 2x / week    PT Duration 8 weeks    PT Treatment/Interventions ADLs/Self Care Home Management;Aquatic Therapy;Cryotherapy;Electrical Stimulation;Ultrasound;Moist Heat;Therapeutic activities;Therapeutic exercise;Neuromuscular re-education;Manual techniques;Patient/family education;Taping;Dry needling    PT Next Visit Plan graded exposure to movement;pain education; check response to DN#1    PT Home Exercise Plan [email or web address]           Patient will benefit from skilled therapeutic intervention in order to improve the following deficits and impairments:  Decreased range of motion,Increased fascial restricitons,Pain,Decreased strength,Decreased activity tolerance,Impaired flexibility  Visit Diagnosis: Chronic bilateral low back pain without sciatica  Muscle weakness (generalized)     Problem List Patient Active Problem List   Diagnosis Date Noted  . Near syncope 05/02/2020  . Anxiety 11/06/2017  . S/P CABG x 3 06/17/2016  . Family history of early CAD 06/04/2016  . Chest pain 06/04/2016  . Abnormal nuclear stress test 06/04/2016  . Degenerative joint disease (DJD) of lumbar spine 03/24/2016  . Depression 03/24/2016  . Obesity 08/14/2015  . Essential hypertension, benign 04/29/2015  .  Hyperlipidemia 04/29/2015   Ruben Im, PT 10/02/20 5:36 PM Phone: [phone] Fax: [phone] Alvera Singh 10/02/2020, 5:35 PM  [facility] [address] [place], [place], [address] Phone: [phone]   Fax:  [phone]  Name: Donna Woodward MRN: [id number] Date of Birth: 1956-02-13

## 2020-10-05 ENCOUNTER — Other Ambulatory Visit: Payer: Self-pay

## 2020-10-05 ENCOUNTER — Ambulatory Visit: Payer: Medicare HMO | Admitting: Physical Therapy

## 2020-10-05 DIAGNOSIS — M545 Low back pain, unspecified: Secondary | ICD-10-CM | POA: Diagnosis not present

## 2020-10-05 DIAGNOSIS — M6281 Muscle weakness (generalized): Secondary | ICD-10-CM

## 2020-10-05 DIAGNOSIS — G8929 Other chronic pain: Secondary | ICD-10-CM

## 2020-10-05 NOTE — Therapy (Signed)
[facility] [address], [place], [place], [address] Phone: [phone]   Fax:  [phone]  Physical Therapy Treatment  Patient Details  Name: Donna Woodward MRN: [id number] Date of Birth: 26-Jan-1956 Referring Provider (PT): Dr. Grier Mitts   Encounter Date: 10/05/2020   PT End of Session - 10/05/20 0847    Visit Number 3    Number of Visits 10    Date for PT Re-Evaluation 11/22/20    Authorization Type [employer] 10 visits    PT Start Time 0800    PT Stop Time 0843    PT Time Calculation (min) 43 min    Activity Tolerance Patient limited by pain           Past Medical History:  Diagnosis Date  . Anxiety   . Back pain   . Coronary artery disease    CABGx3; Seen by Dr. Johnsie Cancel  . Degenerative joint disease (DJD) of lumbar spine 03/24/2016  . Depression   . GERD (gastroesophageal reflux disease)    if overweight  . Hyperlipidemia   . Hypertension     Past Surgical History:  Procedure Laterality Date  . BACK SURGERY    . CARDIAC CATHETERIZATION N/A 06/05/2016   Procedure: Left Heart Cath and Coronary Angiography;  Surgeon: Belva Crome, MD;  Location: [facility];  Service: Cardiovascular;  Laterality: N/A;  . CORONARY ARTERY BYPASS GRAFT N/A 06/17/2016   Procedure: CORONARY ARTERY BYPASS GRAFTING (CABG), ON PUMP, TIMES THREE, USING LEFT INTERNAL MAMMARY ARTERY, RIGHT GREATER SAPHENOUS VEIN HARVESTED ENDOSCOPICALLY;  Surgeon: Ivin Poot, MD;  Location: [facility];  Service: Open Heart Surgery;  Laterality: N/A;  . MULTIPLE TOOTH EXTRACTIONS    . [place]   lamnectomy L5S1  . TEE WITHOUT CARDIOVERSION N/A 06/17/2016   Procedure: TRANSESOPHAGEAL ECHOCARDIOGRAM (TEE);  Surgeon: Ivin Poot, MD;  Location: [facility];  Service: Open Heart Surgery;  Laterality: N/A;    There were no vitals filed for this visit.   Subjective Assessment - 10/05/20 0804    Subjective The ex's are OK.  The DN  hurt and no overall benefit noted yet.    Pertinent History bil tendonitis RTC; OA hands; CABG x3;  goes by "[name]"    Patient Stated Goals see if I can get some relief;  garden; clean house; carry groceries 4 bags; I like to build things wood; fishing    Currently in Pain? Yes    Pain Score 4     Pain Location Back    Pain Orientation Lower;Left;Right                             [facility] Adult PT Treatment/Exercise - 10/05/20 0001      Lumbar Exercises: Stretches   Active Hamstring Stretch Right;Left;5 reps    Active Hamstring Stretch Limitations towel behind knee; off/on no hold    Lower Trunk Rotation 3 reps    Lower Trunk Rotation Limitations 5 sec holds    Other Lumbar Stretch Exercise butterfly supine 5x 5 sec hold      Lumbar Exercises: Aerobic   Nustep L1 6 min while discussing status and plan      Lumbar Exercises: Standing   Other Standing Lumbar Exercises counter push ups 10x    Other Standing Lumbar Exercises wall planks 5x 5 sec holds      Lumbar Exercises: Seated   Sit  to Stand 5 reps   no UEs   Sit to Stand Limitations from very high table    Other Seated Lumbar Exercises red band clams 3x5      Lumbar Exercises: Sidelying   Clam Right;Left;5 reps    Clam Limitations painful low back so discontinued                  PT Education - 10/05/20 0836    Education Details seated red band clams; supine HS with towel behind knee; wall forearm planks    Person(s) Educated Patient    Methods Explanation;Demonstration;Handout    Comprehension Returned demonstration;Verbalized understanding            PT Short Term Goals - 09/27/20 2100      PT SHORT TERM GOAL #1   Title Pt will be independent with initial HEP and perform consistently to maximize return to PLOF.    Time 4    Period Weeks    Target Date 10/25/20      PT SHORT TERM GOAL #2   Title Pt will have improved MMT to 4/5 throughout BLE to demosntrate improved overall strength to  maximize function and gait.    Time 4    Period Weeks    Status New      PT SHORT TERM GOAL #3   Title Pt will report being able to wash dishes, vacumn or mop for 10-15 mins  showing improved tolerance to standing in order maximize participation at home.    Time 4    Period Weeks    Status New      PT SHORT TERM GOAL #4   Title ...      PT SHORT TERM GOAL #5   Title .Marland KitchenMarland Kitchen             PT Long Term Goals - 09/27/20 2102      PT LONG TERM GOAL #1   Title Pt will have improved 5xSTS to 15 sec or < to demonstrate improved overall BLE strength and power in order to maximize functional tasks at home and in the community.    Time 8    Period Weeks    Status New    Target Date 11/22/20      PT LONG TERM GOAL #2   Title Pt will have improved lumbar flexion and extension ROM by 50% to demonstrate improved function for gardening, fishing and woodwork and decrease pain overall.    Time 8    Period Weeks    Status New      PT LONG TERM GOAL #3   Title Pt will have improved TUG to 16 sec or < with LRAD in order to demonstrate improved overall function and maximzie participation at home and in the community.    Time 8    Period Weeks    Status On-going      PT LONG TERM GOAL #4   Title FOTO functional outcome score improved to 52%    Time 8    Period Weeks    Status New      PT LONG TERM GOAL #5   Title Trunk and LE strength grossly 4+/5 needed to rise from a chair without UE assist and greater ease negotiating steps and curbs    Time 8    Period Weeks    Status New                 Plan - 10/05/20 8676  Clinical Impression Statement The patient is able to perform low level exercise using grade exposure/underdosing method for better success in management of this chronic pain syndrome.  Exercises were modified according to her response (seated clams felt better than sidelying clams and wall planks were preferable to counter push ups).  Limited repetitions for all.   She  has quite a bit of difficulty with sit to stand even from a high table.   She reports DN last visit was very painful and she does not feel an overall improvement in symptoms at this point however she states she might be willing to try again at a later time.    Comorbidities Cardiac surgery; previous back surgery; multi region OA; HTN; anxiety    Examination-Participation Restrictions Meal Prep;Cleaning;Community Activity;Yard Work;Laundry;Shop    Rehab Potential Good    PT Frequency 2x / week    PT Duration 8 weeks    PT Treatment/Interventions ADLs/Self Care Home Management;Aquatic Therapy;Cryotherapy;Electrical Stimulation;Ultrasound;Moist Heat;Therapeutic activities;Therapeutic exercise;Neuromuscular re-education;Manual techniques;Patient/family education;Taping;Dry needling    PT Next Visit Plan graded exposure to movement;  try light weight on leg press with seat pushed back;   pain education;   hold DN    PT Home Exercise Plan [email or web address]           Patient will benefit from skilled therapeutic intervention in order to improve the following deficits and impairments:  Decreased range of motion,Increased fascial restricitons,Pain,Decreased strength,Decreased activity tolerance,Impaired flexibility  Visit Diagnosis: Chronic bilateral low back pain without sciatica  Muscle weakness (generalized)     Problem List Patient Active Problem List   Diagnosis Date Noted  . Near syncope 05/02/2020  . Anxiety 11/06/2017  . S/P CABG x 3 06/17/2016  . Family history of early CAD 06/04/2016  . Chest pain 06/04/2016  . Abnormal nuclear stress test 06/04/2016  . Degenerative joint disease (DJD) of lumbar spine 03/24/2016  . Depression 03/24/2016  . Obesity 08/14/2015  . Essential hypertension, benign 04/29/2015  . Hyperlipidemia 04/29/2015   Ruben Im, PT 10/05/20 9:03 AM Phone: [phone] Fax: [phone] Alvera Singh 10/05/2020, 9:02 AM  [facility] [address] [place], [place], [address] Phone: [phone]   Fax:  [phone]  Name: Donna Woodward MRN: [id number] Date of Birth: 1955/10/29

## 2020-10-05 NOTE — Patient Instructions (Signed)
Access Code: [email or web address] URL: [email or web address] Date: 10/05/2020 Prepared by: Ruben Im  Exercises Hooklying Single Knee to Chest Stretch - 2 x daily - 7 x weekly - 1 sets - 5 reps - 5 hold Supine Lower Trunk Rotation - 2 x daily - 7 x weekly - 1 sets - 5 reps - 5 hold Supine Butterfly Groin Stretch - 2 x daily - 7 x weekly - 1 sets - 5 reps - 5 hold supine Hamstring stretch - 1 x daily - 7 x weekly - 1 sets - 5 reps Seated Hip Abduction with Resistance - 1 x daily - 7 x weekly - 3 sets - 5 reps Forearm Plank on Wall - 1 x daily - 7 x weekly - 1 sets - 5 reps - 5 hold

## 2020-10-15 NOTE — Progress Notes (Signed)
CARDIOLOGY OFFICE NOTE  Date:  10/29/2020    Donna Woodward Date of Birth: Jul 13, 1955 Medical Record #[id number]  PCP:  Billie Ruddy, MD  Cardiologist:  Johnsie Cancel   No chief complaint on file.   History of Present Illness: Donna Woodward is a 65 y.o. female  has a history of HTN, HLD, previous smoker and known CAD with prior CABG x 3 in 2017 with LIMA to LAD; SVG to PD and SVG to DX. Did have post op ventricular ectopy - required short term use of amiodarone.    Presented to the ER 05/02/20  with palpitations/pre syncope along with chest pressure and headache.. CT of the head negative. Troponin negative. Myovue called ischemia but to my read breast attenuation low risk K was low and diuretic d/c with increase in ACE BP up on visit with primary 06/08/20   Activity limited by chronic back pain   January aldactone added for BP and low K resolved   No chest pain compliant with meds  Past Medical History:  Diagnosis Date  . Anxiety   . Back pain   . Coronary artery disease    CABGx3; Seen by Dr. Johnsie Cancel  . Degenerative joint disease (DJD) of lumbar spine 03/24/2016  . Depression   . GERD (gastroesophageal reflux disease)    if overweight  . Hyperlipidemia   . Hypertension     Past Surgical History:  Procedure Laterality Date  . BACK SURGERY    . CARDIAC CATHETERIZATION N/A 06/05/2016   Procedure: Left Heart Cath and Coronary Angiography;  Surgeon: Belva Crome, MD;  Location: [facility];  Service: Cardiovascular;  Laterality: N/A;  . CORONARY ARTERY BYPASS GRAFT N/A 06/17/2016   Procedure: CORONARY ARTERY BYPASS GRAFTING (CABG), ON PUMP, TIMES THREE, USING LEFT INTERNAL MAMMARY ARTERY, RIGHT GREATER SAPHENOUS VEIN HARVESTED ENDOSCOPICALLY;  Surgeon: Ivin Poot, MD;  Location: [facility];  Service: Open Heart Surgery;  Laterality: N/A;  . MULTIPLE TOOTH EXTRACTIONS    . [place]   lamnectomy L5S1  . TEE WITHOUT CARDIOVERSION N/A 06/17/2016    Procedure: TRANSESOPHAGEAL ECHOCARDIOGRAM (TEE);  Surgeon: Ivin Poot, MD;  Location: [facility];  Service: Open Heart Surgery;  Laterality: N/A;     Medications: Current Meds  Medication Sig  . acetaminophen (TYLENOL) 325 MG tablet Take 325-650 mg by mouth every 6 (six) hours as needed for mild pain or headache.  Marland Kitchen aspirin EC 81 MG tablet Take 1 tablet (81 mg total) by mouth daily.  . Calcium Carb-Cholecalciferol (CALCIUM + D3 PO) Take 1 tablet by mouth daily with breakfast.  . cyclobenzaprine (FLEXERIL) 5 MG tablet Take one to two po qhs prn muscle spasm  . fexofenadine (ALLEGRA ALLERGY) 180 MG tablet Take 1 tablet (180 mg total) by mouth daily. (Patient taking differently: Take 180 mg by mouth once as needed for allergies.)  . lisinopril (ZESTRIL) 40 MG tablet Take 1 tablet (40 mg total) by mouth daily.  . metoprolol succinate (TOPROL-XL) 100 MG 24 hr tablet TAKE 1 TABLET DAILY. TAKE WITH OR IMMEDIATELY FOLLOWING A MEAL.  Marland Kitchen NIFEdipine (PROCARDIA-XL/NIFEDICAL-XL) 30 MG 24 hr tablet Take 1 tablet (30 mg total) by mouth daily.  . Omega-3 Fatty Acids (FISH OIL) 1000 MG CAPS Take 1,000 mg by mouth in the morning and at bedtime.   . simvastatin (ZOCOR) 40 MG tablet TAKE 1 TABLET EVERY DAY  . spironolactone (ALDACTONE) 25 MG tablet Take 1 tablet (25 mg total)  by mouth daily.     Allergies: Allergies  Allergen Reactions  . Bee Venom Swelling and Other (See Comments)    Swells where stung- no breathing issues    Social History: The patient  reports that she quit smoking about 14 years ago. She has never used smokeless tobacco. She reports current alcohol use. She reports current drug use. Drug: Marijuana.   Family History: The patient's family history includes CAD (age of onset: 57) in her sister; Cancer (age of onset: 17) in her sister; Dementia in her father; Heart disease in her father; Hypertension in her mother, sister, sister, and sister; Stroke in her father.   Review of  Systems: Please see the history of present illness.   All other systems are reviewed and negative.   Physical Exam: VS:  BP 122/78   Pulse 65   Ht 5\' 4"  (1.626 m)   Wt 78.5 kg   SpO2 98%   BMI 29.70 kg/m  .  BMI Body mass index is 29.7 kg/m.  Wt Readings from Last 3 Encounters:  10/29/20 78.5 kg  10/18/20 78.5 kg  08/20/20 79.6 kg    Affect appropriate Healthy:  appears stated age 67: normal Neck supple with no adenopathy JVP normal no bruits no thyromegaly Lungs clear with no wheezing and good diaphragmatic motion Heart:  S1/S2 no murmur, no rub, gallop or click PMI normal post sternotomy  Abdomen: benighn, BS positve, no tenderness, no AAA no bruit.  No HSM or HJR Distal pulses intact with no bruits No edema Neuro non-focal Skin warm and dry No muscular weakness    LABORATORY DATA:  EKG:    SR PAC lateral ST depression 05/03/20   Lab Results  Component Value Date   WBC 8.2 05/03/2020   HGB 13.8 05/03/2020   HCT 42.1 05/03/2020   PLT 195 05/03/2020   GLUCOSE 77 08/20/2020   CHOL 137 10/20/2019   TRIG 77.0 10/20/2019   HDL 37.20 (L) 10/20/2019   LDLCALC 84 10/20/2019   ALT 18 05/03/2020   AST 15 05/03/2020   NA 139 08/20/2020   K 4.0 08/20/2020   CL 101 08/20/2020   CREATININE 0.71 08/20/2020   BUN 14 08/20/2020   CO2 24 08/20/2020   TSH 1.65 10/20/2019   INR 1.23 06/17/2016   HGBA1C 5.8 10/20/2019     BNP (last 3 results) No results for input(s): BNP in the last 8760 hours.  ProBNP (last 3 results) No results for input(s): PROBNP in the last 8760 hours.   Other Studies Reviewed Today:  MYOVIEW June 01, 2020 Narrative & Impression    Findings consistent with prior myocardial infarction with peri-infarct ischemia.  The left ventricular ejection fraction is normal (55-65%).  Nuclear stress EF: 58%.  Horizontal ST segment depression ST segment depression was noted during stress. 1-2 mm  This is a low-intermediate risk study.   Small  partially reversible perfusion defect in the anteroapex, moderate perfusion defect with stress that returns to mild with rest.  At the mid ventricle there is a mild perfusion defect with stress that returns to normal at rest.  Findings in combination suggest prior infarct with peri-infarct ischemia.  EKG changes were noted with stress, suggestive of ischemia.  Grossly normal wall motion. The study is low to intermediate risk due to small area of ischemia and ECG changes.    Myoview reviewed with Dr. Johnsie Cancel on 10/29/20 - he feels her study is stable with breast attenuation noted - reassuring study.   TEE  06/17/16 Result status: Final result   Left ventricle: Normal cavity size and wall thickness. LV systolic function is low normal with an EF of 50-55%. No thrombus present. No mass present.  Septum: No Patent Foramen Ovale present. The interatrial septum bows to the left consistent with high right atrial pressure.  Left atrium: Patent foramen ovale not present.  Aortic valve: The valve is trileaflet. Mild valve calcification present. No stenosis. No stenosis by continuous wave doppler Trace regurgitation.  Mitral valve: Mild leaflet thickening is present. Trace regurgitation.  Right ventricle: Normal cavity size and ejection fraction. There is mild hypertrophy.  Tricuspid valve: Mild regurgitation. The tricuspid valve regurgitation jet is central.     Cardiac Catheterization:  06/05/16 Left Heart Cath and Coronary Angiography  Conclusion    Severe calcific, diffuse three-vessel coronary disease.  Codominant LAD/diagonal system with severe diffuse disease in each vessel with graftable distal targets.  Eccentric 30-60% ostial left main depending upon review.  99% proximal to mid RCA. Total occlusion at the ostium of a large PDA that fills left-to-right collaterals.  Moderate mid circumflex disease but without graftable targets beyond the stenosis.  Normal left ventricular  systolic function with normal hemodynamics   RECOMMENDATIONS:   Continue current medical regimen.  Expedient evaluation by [facility] for multivessel coronary bypass grafting.       ASSESSMENT & PLAN:   1. Near syncope/palpitations - in the setting of low potassium Resolved no high risk features continue beta blocker Can arrange monitor if recurs .    2. Known CAD with prior CABG x 3 in 2017 -  myovue 05/03/20 read as ischemia but to my review only breast attenuation no ischemia/infarct low risk   3. HLD - on statin LDL 84 10/20/19   4. HTN - Poorly controlled Aldactone added 07/26/20 improved home readings better      5. PVCs - on beta blocker - would continue. EF has been 50-55%   6. Hypokalemia - resolved K 4.0 08/20/20 HCTZ d/c and on aldactone   Current medicines are reviewed with the patient today.  The patient does not have concerns regarding medicines other than what has been noted above.  The following changes have been made:  See above.  Labs/ tests ordered today include:   No orders of the defined types were placed in this encounter.    Disposition:   FU in  A year     Patient is agreeable to this plan and will call if any problems develop in the interim.   Signed: Jenkins Rouge, MD  10/29/2020 9:51 AM  [facility] [address] [place],   [address] Phone: [phone] Fax: [phone]

## 2020-10-16 ENCOUNTER — Encounter: Payer: Self-pay | Admitting: Physical Therapy

## 2020-10-16 ENCOUNTER — Other Ambulatory Visit: Payer: Self-pay

## 2020-10-16 ENCOUNTER — Ambulatory Visit: Payer: Medicare HMO | Admitting: Physical Therapy

## 2020-10-16 DIAGNOSIS — G8929 Other chronic pain: Secondary | ICD-10-CM | POA: Diagnosis not present

## 2020-10-16 DIAGNOSIS — M6281 Muscle weakness (generalized): Secondary | ICD-10-CM

## 2020-10-16 DIAGNOSIS — M545 Low back pain, unspecified: Secondary | ICD-10-CM

## 2020-10-16 NOTE — Therapy (Signed)
[facility] [address] [place], [place], [address] Phone: [phone]   Fax:  [phone]  Physical Therapy Treatment  Patient Details  Name: Donna Woodward MRN: [id number] Date of Birth: July 16, 1955 Referring Provider (PT): Dr. Grier Mitts   Encounter Date: 10/16/2020   PT End of Session - 10/16/20 1107    Visit Number 4    Number of Visits 10    Date for PT Re-Evaluation 11/22/20    Authorization Type [employer] 10 visits    PT Start Time 1101    PT Stop Time 1145    PT Time Calculation (min) 44 min    Activity Tolerance Patient limited by pain           Past Medical History:  Diagnosis Date  . Anxiety   . Back pain   . Coronary artery disease    CABGx3; Seen by Dr. Johnsie Cancel  . Degenerative joint disease (DJD) of lumbar spine 03/24/2016  . Depression   . GERD (gastroesophageal reflux disease)    if overweight  . Hyperlipidemia   . Hypertension     Past Surgical History:  Procedure Laterality Date  . BACK SURGERY    . CARDIAC CATHETERIZATION N/A 06/05/2016   Procedure: Left Heart Cath and Coronary Angiography;  Surgeon: Belva Crome, MD;  Location: [facility];  Service: Cardiovascular;  Laterality: N/A;  . CORONARY ARTERY BYPASS GRAFT N/A 06/17/2016   Procedure: CORONARY ARTERY BYPASS GRAFTING (CABG), ON PUMP, TIMES THREE, USING LEFT INTERNAL MAMMARY ARTERY, RIGHT GREATER SAPHENOUS VEIN HARVESTED ENDOSCOPICALLY;  Surgeon: Ivin Poot, MD;  Location: [facility];  Service: Open Heart Surgery;  Laterality: N/A;  . MULTIPLE TOOTH EXTRACTIONS    . [place]   lamnectomy L5S1  . TEE WITHOUT CARDIOVERSION N/A 06/17/2016   Procedure: TRANSESOPHAGEAL ECHOCARDIOGRAM (TEE);  Surgeon: Ivin Poot, MD;  Location: [facility];  Service: Open Heart Surgery;  Laterality: N/A;    There were no vitals filed for this visit.   Subjective Assessment - 10/16/20 1103    Subjective pt states that no change  has been noted as of now.    Pertinent History bil tendonitis RTC; OA hands; CABG x3;  goes by [name]    Patient Stated Goals see if I can get some relief;  garden; clean house; carry groceries 4 bags; I like to build things wood; fishing    Currently in Pain? Other (Comment)   no worse but no better                            [facility] Adult PT Treatment/Exercise - 10/16/20 0001      Exercises   Exercises Lumbar      Lumbar Exercises: Machines for Strengthening   Other Lumbar Machine Exercise power tower BUE extension #15 x10 reps      Lumbar Exercises: Standing   Other Standing Lumbar Exercises standing on foam pad: diagonals shoulder to hip holding blue weighted ball x5 reps, shoulder to shoulder 2x5 reps      Lumbar Exercises: Seated   Other Seated Lumbar Exercises clams: yellow TB x5 reps    Other Seated Lumbar Exercises BUE walkout on blue physioball x10 reps; seated BUE press into foam roll with exhale/inhale x 10 reps                  PT Education - 10/16/20 1520  Education Details expectations of muscle burning/fatigue with different exercises like clamshells    Person(s) Educated Patient    Methods Explanation;Verbal cues;Tactile cues    Comprehension Verbalized understanding;Returned demonstration            PT Short Term Goals - 09/27/20 2100      PT SHORT TERM GOAL #1   Title Pt will be independent with initial HEP and perform consistently to maximize return to PLOF.    Time 4    Period Weeks    Target Date 10/25/20      PT SHORT TERM GOAL #2   Title Pt will have improved MMT to 4/5 throughout BLE to demosntrate improved overall strength to maximize function and gait.    Time 4    Period Weeks    Status New      PT SHORT TERM GOAL #3   Title Pt will report being able to wash dishes, vacumn or mop for 10-15 mins  showing improved tolerance to standing in order maximize participation at home.    Time 4    Period Weeks    Status New       PT SHORT TERM GOAL #4   Title ...      PT SHORT TERM GOAL #5   Title .Marland KitchenMarland Kitchen             PT Long Term Goals - 09/27/20 2102      PT LONG TERM GOAL #1   Title Pt will have improved 5xSTS to 15 sec or < to demonstrate improved overall BLE strength and power in order to maximize functional tasks at home and in the community.    Time 8    Period Weeks    Status New    Target Date 11/22/20      PT LONG TERM GOAL #2   Title Pt will have improved lumbar flexion and extension ROM by 50% to demonstrate improved function for gardening, fishing and woodwork and decrease pain overall.    Time 8    Period Weeks    Status New      PT LONG TERM GOAL #3   Title Pt will have improved TUG to 16 sec or < with LRAD in order to demonstrate improved overall function and maximzie participation at home and in the community.    Time 8    Period Weeks    Status On-going      PT LONG TERM GOAL #4   Title FOTO functional outcome score improved to 52%    Time 8    Period Weeks    Status New      PT LONG TERM GOAL #5   Title Trunk and LE strength grossly 4+/5 needed to rise from a chair without UE assist and greater ease negotiating steps and curbs    Time 8    Period Weeks    Status New                 Plan - 10/16/20 1521    Clinical Impression Statement Pt has persistent back pain, but despite increase in activity during her previous sessions, there has been no increase in her pain. She was able to complete several new exercises today without increase in pain. Pt has difficutly properly activating her lats during seated UE press, using the paraspinals as compensation, but this was improved following PT cuing. Pt was able to incorporate UE movements with static trunk stabilization in standing. Will monitor pt's response  to today's new activitites and consider updating her HEP at the next visit.    Comorbidities Cardiac surgery; previous back surgery; multi region OA; HTN; anxiety     Examination-Participation Restrictions Meal Prep;Cleaning;Community Activity;Yard Work;Laundry;Shop    Rehab Potential Good    PT Frequency 2x / week    PT Duration 8 weeks    PT Treatment/Interventions ADLs/Self Care Home Management;Aquatic Therapy;Cryotherapy;Electrical Stimulation;Ultrasound;Moist Heat;Therapeutic activities;Therapeutic exercise;Neuromuscular re-education;Manual techniques;Patient/family education;Taping;Dry needling    PT Next Visit Plan graded exposure to movement- update HEP; try light weight on leg press with seat pushed back; pain education;   hold DN    PT Home Exercise Plan [email or web address]           Patient will benefit from skilled therapeutic intervention in order to improve the following deficits and impairments:  Decreased range of motion,Increased fascial restricitons,Pain,Decreased strength,Decreased activity tolerance,Impaired flexibility  Visit Diagnosis: Chronic bilateral low back pain without sciatica  Muscle weakness (generalized)     Problem List Patient Active Problem List   Diagnosis Date Noted  . Near syncope 05/02/2020  . Anxiety 11/06/2017  . S/P CABG x 3 06/17/2016  . Family history of early CAD 06/04/2016  . Chest pain 06/04/2016  . Abnormal nuclear stress test 06/04/2016  . Degenerative joint disease (DJD) of lumbar spine 03/24/2016  . Depression 03/24/2016  . Obesity 08/14/2015  . Essential hypertension, benign 04/29/2015  . Hyperlipidemia 04/29/2015   3:32 PM,10/16/20 Sherol Dade PT, DPT [facility] [place]  [facility] [address], [place], [place], [address] Phone: [phone]   Fax:  [phone]  Name: KASMIRA CACIOPPO MRN: [id number] Date of Birth: [birth year]

## 2020-10-18 ENCOUNTER — Encounter: Payer: Self-pay | Admitting: Family Medicine

## 2020-10-18 ENCOUNTER — Ambulatory Visit (INDEPENDENT_AMBULATORY_CARE_PROVIDER_SITE_OTHER): Payer: Medicare HMO | Admitting: Family Medicine

## 2020-10-18 ENCOUNTER — Ambulatory Visit: Payer: Medicare HMO | Admitting: Physical Therapy

## 2020-10-18 ENCOUNTER — Encounter: Payer: Self-pay | Admitting: Physical Therapy

## 2020-10-18 ENCOUNTER — Other Ambulatory Visit: Payer: Self-pay

## 2020-10-18 VITALS — BP 148/84 | HR 66 | Temp 98.5°F | Wt 173.0 lb

## 2020-10-18 DIAGNOSIS — M6281 Muscle weakness (generalized): Secondary | ICD-10-CM | POA: Diagnosis not present

## 2020-10-18 DIAGNOSIS — M545 Low back pain, unspecified: Secondary | ICD-10-CM

## 2020-10-18 DIAGNOSIS — I10 Essential (primary) hypertension: Secondary | ICD-10-CM | POA: Diagnosis not present

## 2020-10-18 DIAGNOSIS — G8929 Other chronic pain: Secondary | ICD-10-CM | POA: Diagnosis not present

## 2020-10-18 MED ORDER — SPIRONOLACTONE 25 MG PO TABS: 25.0000 mg | ORAL_TABLET | Freq: Every day | ORAL | 0 refills | Status: DC

## 2020-10-18 NOTE — Progress Notes (Signed)
Subjective:    Patient ID: Donna Woodward, female    DOB: 1955-09-01, 65 y.o.   MRN: [id number]  Chief Complaint  Patient presents with  . Hypertension    HPI Patient was seen today for follow-up on HTN.  Patient taking metoprolol succinate 100 mg, spironolactone 12.5 mg, nifedipine XL 30 mg, lisinopril 40 mg daily.  BP at home 140/92, 160/91, 141/91, 126/79, 118/78, 138/86, 128/90, 165/100.  Patient notes BP elevation yesterday accompanied by flushing.  Patient drinking water daily, cooking at home, and PT for chronic back pain.  Patient denies increased stress, but states would like info on [facility] services.  States does not feel anxious.  Notes feeling alone aside from her dog.   Past Medical History:  Diagnosis Date  . Anxiety   . Back pain   . Coronary artery disease    CABGx3; Seen by Dr. Johnsie Cancel  . Degenerative joint disease (DJD) of lumbar spine 03/24/2016  . Depression   . GERD (gastroesophageal reflux disease)    if overweight  . Hyperlipidemia   . Hypertension     Allergies  Allergen Reactions  . Bee Venom Swelling and Other (See Comments)    Swells where stung- no breathing issues    ROS General: Denies fever, chills, night sweats, changes in weight, changes in appetite + flushing HEENT: Denies headaches, ear pain, changes in vision, rhinorrhea, sore throat CV: Denies CP, palpitations, SOB, orthopnea Pulm: Denies SOB, cough, wheezing GI: Denies abdominal pain, nausea, vomiting, diarrhea, constipation GU: Denies dysuria, hematuria, frequency, vaginal discharge Msk: Denies muscle cramps, joint pains + chronic low back pain Neuro: Denies weakness, numbness, tingling Skin: Denies rashes, bruising Psych: Denies depression, anxiety, hallucinations     Objective:    Blood pressure (!) 148/84, pulse 66, temperature 98.5 F (36.9 C), temperature source Oral, weight 173 lb (78.5 kg), SpO2 96 %.  Gen. Pleasant, well-nourished, in no distress, normal affect   HEENT:  [place]/AT, face symmetric, conjunctiva clear, no scleral icterus, PERRLA, EOMI, nares patent without drainage Lungs: no accessory muscle use Cardiovascular: RRR,  no peripheral edema Neuro:  A&Ox3, CN II-XII intact, normal gait Skin:  Warm, no lesions/ rash   Wt Readings from Last 3 Encounters:  10/18/20 173 lb (78.5 kg)  08/20/20 175 lb 6.4 oz (79.6 kg)  07/26/20 175 lb (79.4 kg)    Lab Results  Component Value Date   WBC 8.2 05/03/2020   HGB 13.8 05/03/2020   HCT 42.1 05/03/2020   PLT 195 05/03/2020   GLUCOSE 77 08/20/2020   CHOL 137 10/20/2019   TRIG 77.0 10/20/2019   HDL 37.20 (L) 10/20/2019   LDLCALC 84 10/20/2019   ALT 18 05/03/2020   AST 15 05/03/2020   NA 139 08/20/2020   K 4.0 08/20/2020   CL 101 08/20/2020   CREATININE 0.71 08/20/2020   BUN 14 08/20/2020   CO2 24 08/20/2020   TSH 1.65 10/20/2019   INR 1.23 06/17/2016   HGBA1C 5.8 10/20/2019    Assessment/Plan:  Essential hypertension  -Elevated -Discussed increasing spironolactone from 12.5 mg to 25 mg daily. Pt will start taking a whole tab (25 mg).  Med list updated to reflect increase however Rx was not sent in as patient recently had meds refilled -Continue other medications including Procardia XL 30 mg daily, lisinopril 40 mg daily -Continue lifestyle modifications -Continue checking BP at home and bring the log to clinic - Plan: spironolactone (ALDACTONE) 25 MG tablet  Given information about [facility] providers  F/u  in 4-6 weeks, sooner if needed  Grier Mitts, MD

## 2020-10-18 NOTE — Patient Instructions (Signed)
Access Code: [email or web address] URL: [email or web address] Date: 10/18/2020 Prepared by: [facility]  Exercises Supine Lower Trunk Rotation - 2 x daily - 7 x weekly - 1 sets - 5 reps - 5 hold Supine Butterfly Groin Stretch - 2 x daily - 7 x weekly - 1 sets - 5 reps - 5 hold supine Hamstring stretch - 1 x daily - 7 x weekly - 1 sets - 5 reps Seated Hip Abduction with Resistance - 1 x daily - 7 x weekly - 3 sets - 5 reps Forearm Plank on Wall - 1 x daily - 7 x weekly - 1 sets - 5 reps - 5 hold Seated High Lat Pull Down with Overhead Anchored Resistance - 1 x daily - 7 x weekly - 1 sets - 10 reps Shoulder extension with resistance - Neutral - 1 x daily - 7 x weekly - 1 sets - 10 reps  [facility] [address], [place], [place] [address] Phone # [phone] Fax [phone]

## 2020-10-18 NOTE — Therapy (Signed)
[facility] [address], [place], [place], [address] Phone: [phone]   Fax:  [phone]  Physical Therapy Treatment  Patient Details  Name: Donna Woodward MRN: [id number] Date of Birth: [birth year] Referring Provider (PT): Dr. Grier Mitts   Encounter Date: 10/18/2020   PT End of Session - 10/18/20 1244    Visit Number 5    Number of Visits 10    Date for PT Re-Evaluation 11/22/20    Authorization Type [employer] 10 visits    PT Start Time 1100    PT Stop Time 1140    PT Time Calculation (min) 40 min    Activity Tolerance Treatment limited secondary to medical complications (Comment)   monitoring BP and symptoms during session   Behavior During Therapy [facility] for tasks assessed/performed           Past Medical History:  Diagnosis Date  . Anxiety   . Back pain   . Coronary artery disease    CABGx3; Seen by Dr. Johnsie Cancel  . Degenerative joint disease (DJD) of lumbar spine 03/24/2016  . Depression   . GERD (gastroesophageal reflux disease)    if overweight  . Hyperlipidemia   . Hypertension     Past Surgical History:  Procedure Laterality Date  . BACK SURGERY    . CARDIAC CATHETERIZATION N/A 06/05/2016   Procedure: Left Heart Cath and Coronary Angiography;  Surgeon: Belva Crome, MD;  Location: [place] [facility];  Service: Cardiovascular;  Laterality: N/A;  . CORONARY ARTERY BYPASS GRAFT N/A 06/17/2016   Procedure: CORONARY ARTERY BYPASS GRAFTING (CABG), ON PUMP, TIMES THREE, USING LEFT INTERNAL MAMMARY ARTERY, RIGHT GREATER SAPHENOUS VEIN HARVESTED ENDOSCOPICALLY;  Surgeon: Ivin Poot, MD;  Location: [place];  Service: Open Heart Surgery;  Laterality: N/A;  . MULTIPLE TOOTH EXTRACTIONS    . [place]   lamnectomy L5S1  . TEE WITHOUT CARDIOVERSION N/A 06/17/2016   Procedure: TRANSESOPHAGEAL ECHOCARDIOGRAM (TEE);  Surgeon: Ivin Poot, MD;  Location: [facility];  Service: Open Heart Surgery;   Laterality: N/A;    There were no vitals filed for this visit.   Subjective Assessment - 10/18/20 1112    Subjective Pt reports soreness for the day following her last session but things are good now. She denies pain but reports that her BP has been elevated and she has a slight headache.    Pertinent History bil tendonitis RTC; OA hands; CABG x3;  [place]es [name]    Patient Stated Goals see if I can get some relief;  garden; clean house; carry groceries 4 bags; I like to build things wood; fishing    Currently in Pain? No/denies                             [facility] Adult PT Treatment/Exercise - 10/18/20 0001      Lumbar Exercises: Stretches   Other Lumbar Stretch Exercise supine lat stretch with knees bent and pt cued to press low back into table 2x5 reps      Lumbar Exercises: Machines for Strengthening   Other Lumbar Machine Exercise lat pulldowns #20 x8 reps- TC/VC to keep elbows by side    Other Lumbar Machine Exercise power tower BUE extension #15 x10 reps      Lumbar Exercises: Seated   Other Seated Lumbar Exercises BUE walkout on green physioball x5 rep forward, x5 reps Lt and Rt direction  PT Education - 10/18/20 1244    Education Details noted improved activity tolerance    Person(s) Educated Patient    Methods Explanation;Verbal cues    Comprehension Verbalized understanding            PT Short Term Goals - 09/27/20 2100      PT SHORT TERM GOAL #1   Title Pt will be independent with initial HEP and perform consistently to maximize return to PLOF.    Time 4    Period Weeks    Target Date 10/25/20      PT SHORT TERM GOAL #2   Title Pt will have improved MMT to 4/5 throughout BLE to demosntrate improved overall strength to maximize function and gait.    Time 4    Period Weeks    Status New      PT SHORT TERM GOAL #3   Title Pt will report being able to wash dishes, vacumn or mop for 10-15 mins  showing improved tolerance  to standing in order maximize participation at home.    Time 4    Period Weeks    Status New      PT SHORT TERM GOAL #4   Title ...      PT SHORT TERM GOAL #5   Title .Marland KitchenMarland Kitchen             PT Long Term Goals - 09/27/20 2102      PT LONG TERM GOAL #1   Title Pt will have improved 5xSTS to 15 sec or < to demonstrate improved overall BLE strength and power in order to maximize functional tasks at home and in the community.    Time 8    Period Weeks    Status New    Target Date 11/22/20      PT LONG TERM GOAL #2   Title Pt will have improved lumbar flexion and extension ROM by 50% to demonstrate improved function for gardening, fishing and woodwork and decrease pain overall.    Time 8    Period Weeks    Status New      PT LONG TERM GOAL #3   Title Pt will have improved TUG to 16 sec or < with LRAD in order to demonstrate improved overall function and maximzie participation at home and in the community.    Time 8    Period Weeks    Status On-going      PT LONG TERM GOAL #4   Title FOTO functional outcome score improved to 52%    Time 8    Period Weeks    Status New      PT LONG TERM GOAL #5   Title Trunk and LE strength grossly 4+/5 needed to rise from a chair without UE assist and greater ease negotiating steps and curbs    Time 8    Period Weeks    Status New                 Plan - 10/18/20 1119    Clinical Impression Statement Pt arrived with concerns over and elevated blood pressure and headache. She is going to her PCP today to have this checked. Her BP upon arrival was 166/94 with a mild headache. PT monitored her throughout the session and several activities were held to avoid increasing her BP. Pt had adequate response to last session's exercises and was willing to perform more machine exercises with light resistance. Pt was interested in completing this at  home, so PT provided resistance bands and demonstrated ways to set them up at home. Pt had good  understanding of this. Pt felt a good stretch in the latissimus with supine mat stretch completed at the end of the session. She has been able to gradually increase her participation in her sessions without exacerbating her pain which is making progress towards her functional goals.    Comorbidities Cardiac surgery; previous back surgery; multi region OA; HTN; anxiety    Examination-Participation Restrictions Meal Prep;Cleaning;Community Activity;Yard Work;Laundry;Shop    Rehab Potential Good    PT Frequency 2x / week    PT Duration 8 weeks    PT Treatment/Interventions ADLs/Self Care Home Management;Aquatic Therapy;Cryotherapy;Electrical Stimulation;Ultrasound;Moist Heat;Therapeutic activities;Therapeutic exercise;Neuromuscular re-education;Manual techniques;Patient/family education;Taping;Dry needling    PT Next Visit Plan graded exposure to movement- update HEP; try light weight on leg press with seat pushed back; pain education;   hold DN    PT Home Exercise Plan [id number]           Patient will benefit from skilled therapeutic intervention in order to improve the following deficits and impairments:  Decreased range of motion,Increased fascial restricitons,Pain,Decreased strength,Decreased activity tolerance,Impaired flexibility  Visit Diagnosis: Chronic bilateral low back pain without sciatica  Muscle weakness (generalized)     Problem List Patient Active Problem List   Diagnosis Date Noted  . Near syncope 05/02/2020  . Anxiety 11/06/2017  . S/P CABG x 3 06/17/2016  . Family history of early CAD 06/04/2016  . Chest pain 06/04/2016  . Abnormal nuclear stress test 06/04/2016  . Degenerative joint disease (DJD) of lumbar spine 03/24/2016  . Depression 03/24/2016  . Obesity 08/14/2015  . Essential hypertension, benign 04/29/2015  . Hyperlipidemia 04/29/2015    12:52 PM,10/18/20 Sherol Dade PT, DPT [place] [facility] [place]  [facility] [address] [place], [place], [address] Phone: [phone]   Fax:  [phone]  Name: Donna Woodward MRN: [id number] Date of Birth: 09-12-1955

## 2020-10-18 NOTE — Patient Instructions (Signed)
[email or web address] is an Archivist to help you find a therapist.

## 2020-10-23 ENCOUNTER — Encounter: Payer: Medicare HMO | Admitting: Physical Therapy

## 2020-10-24 ENCOUNTER — Other Ambulatory Visit: Payer: Self-pay | Admitting: Family Medicine

## 2020-10-24 ENCOUNTER — Ambulatory Visit (INDEPENDENT_AMBULATORY_CARE_PROVIDER_SITE_OTHER): Payer: Medicare HMO

## 2020-10-24 ENCOUNTER — Other Ambulatory Visit: Payer: Self-pay

## 2020-10-24 DIAGNOSIS — Z0000 Encounter for general adult medical examination without abnormal findings: Secondary | ICD-10-CM

## 2020-10-24 NOTE — Patient Instructions (Addendum)
[name] , Thank you for taking time to come for your Medicare Wellness Visit. I appreciate your ongoing commitment to your health goals. Please review the following plan we discussed and let me know if I can assist you in the future.   Screening recommendations/referrals: Colonoscopy: Declined at this time Mammogram: one 06/26/20 Bone Density: Pt stated completed  Recommended yearly ophthalmology/optometry visit for glaucoma screening and checkup Recommended yearly dental visit for hygiene and checkup  Vaccinations: Influenza vaccine: Up to date Tdap vaccine: Up to date Shingles vaccine: Shingrix discussed. Please contact your pharmacy for coverage information.   Covid-19: Completed 5/15 & 12/05/19  Advanced directives: Advance directive discussed with you today. Even though you declined this today please call our office should you change your mind and we can give you the proper paperwork for you to fill out.  Conditions/risks identified: live a long healthy life  Next appointment: Follow up in one year for your annual wellness visit.   Preventive Care 40-64 Years, Female Preventive care refers to lifestyle choices and visits with your health care provider that can promote health and wellness. What does preventive care include?  A yearly physical exam. This is also called an annual well check.  Dental exams once or twice a year.  Routine eye exams. Ask your health care provider how often you should have your eyes checked.  Personal lifestyle choices, including:  Daily care of your teeth and gums.  Regular physical activity.  Eating a healthy diet.  Avoiding tobacco and drug use.  Limiting alcohol use.  Practicing safe sex.  Taking low-dose aspirin daily starting at age 65.  Taking vitamin and mineral supplements as recommended by your health care provider. What happens during an annual well check? The services and screenings done by your health care provider  during your annual well check will depend on your age, overall health, lifestyle risk factors, and family history of disease. Counseling  Your health care provider may ask you questions about your:  Alcohol use.  Tobacco use.  Drug use.  Emotional well-being.  Home and relationship well-being.  Sexual activity.  Eating habits.  Work and work Statistician.  Method of birth control.  Menstrual cycle.  Pregnancy history. Screening  You may have the following tests or measurements:  Height, weight, and BMI.  Blood pressure.  Lipid and cholesterol levels. These may be checked every 5 years, or more frequently if you are over 42 years old.  Skin check.  Lung cancer screening. You may have this screening every year starting at age 50 if you have a 30-pack-year history of smoking and currently smoke or have quit within the past 15 years.  Fecal occult blood test (FOBT) of the stool. You may have this test every year starting at age 67.  Flexible sigmoidoscopy or colonoscopy. You may have a sigmoidoscopy every 5 years or a colonoscopy every 10 years starting at age 95.  Hepatitis C blood test.  Hepatitis B blood test.  Sexually transmitted disease (STD) testing.  Diabetes screening. This is done by checking your blood sugar (glucose) after you have not eaten for a while (fasting). You may have this done every 1-3 years.  Mammogram. This may be done every 1-2 years. Talk to your health care provider about when you should start having regular mammograms. This may depend on whether you have a family history of breast cancer.  BRCA-related cancer screening. This may be done if you have a family history of breast, ovarian,  tubal, or peritoneal cancers.  Pelvic exam and Pap test. This may be done every 3 years starting at age 78. Starting at age 50, this may be done every 5 years if you have a Pap test in combination with an HPV test.  Bone density scan. This is done to screen  for osteoporosis. You may have this scan if you are at high risk for osteoporosis. Discuss your test results, treatment options, and if necessary, the need for more tests with your health care provider. Vaccines  Your health care provider may recommend certain vaccines, such as:  Influenza vaccine. This is recommended every year.  Tetanus, diphtheria, and acellular pertussis (Tdap, Td) vaccine. You may need a Td booster every 10 years.  Zoster vaccine. You may need this after age 9.  Pneumococcal 13-valent conjugate (PCV13) vaccine. You may need this if you have certain conditions and were not previously vaccinated.  Pneumococcal polysaccharide (PPSV23) vaccine. You may need one or two doses if you smoke cigarettes or if you have certain conditions. Talk to your health care provider about which screenings and vaccines you need and how often you need them. This information is not intended to replace advice given to you by your health care provider. Make sure you discuss any questions you have with your health care provider. Document Released: 07/13/2015 Document Revised: 03/05/2016 Document Reviewed: 04/17/2015 Elsevier Interactive Patient Education  2017 [employer] [place] Prevention in the Home Falls can cause injuries. They can happen to people of all ages. There are many things you can do to make your home safe and to help prevent falls. What can I do on the outside of my home?  Regularly fix the edges of walkways and driveways and fix any cracks.  Remove anything that might make you trip as you walk through a door, such as a raised step or threshold.  Trim any bushes or trees on the path to your home.  Use bright outdoor lighting.  Clear any walking paths of anything that might make someone trip, such as rocks or tools.  Regularly check to see if handrails are loose or broken. Make sure that both sides of any steps have handrails.  Any raised decks and porches should  have guardrails on the edges.  Have any leaves, snow, or ice cleared regularly.  Use sand or salt on walking paths during winter.  Clean up any spills in your garage right away. This includes oil or grease spills. What can I do in the bathroom?  Use night lights.  Install grab bars by the toilet and in the tub and shower. Do not use towel bars as grab bars.  Use non-skid mats or decals in the tub or shower.  If you need to sit down in the shower, use a plastic, non-slip stool.  Keep the floor dry. Clean up any water that spills on the floor as soon as it happens.  Remove soap buildup in the tub or shower regularly.  Attach bath mats securely with double-sided non-slip rug tape.  Do not have throw rugs and other things on the floor that can make you trip. What can I do in the bedroom?  Use night lights.  Make sure that you have a light by your bed that is easy to reach.  Do not use any sheets or blankets that are too big for your bed. They should not hang down onto the floor.  Have a firm chair that has side arms.  You can use this for support while you get dressed.  Do not have throw rugs and other things on the floor that can make you trip. What can I do in the kitchen?  Clean up any spills right away.  Avoid walking on wet floors.  Keep items that you use a lot in easy-to-reach places.  If you need to reach something above you, use a strong step stool that has a grab bar.  Keep electrical cords out of the way.  Do not use floor polish or wax that makes floors slippery. If you must use wax, use non-skid floor wax.  Do not have throw rugs and other things on the floor that can make you trip. What can I do with my stairs?  Do not leave any items on the stairs.  Make sure that there are handrails on both sides of the stairs and use them. Fix handrails that are broken or loose. Make sure that handrails are as long as the stairways.  Check any carpeting to make sure  that it is firmly attached to the stairs. Fix any carpet that is loose or worn.  Avoid having throw rugs at the top or bottom of the stairs. If you do have throw rugs, attach them to the floor with carpet tape.  Make sure that you have a light switch at the top of the stairs and the bottom of the stairs. If you do not have them, ask someone to add them for you. What else can I do to help prevent falls?  Wear shoes that:  Do not have high heels.  Have rubber bottoms.  Are comfortable and fit you well.  Are closed at the toe. Do not wear sandals.  If you use a stepladder:  Make sure that it is fully opened. Do not climb a closed stepladder.  Make sure that both sides of the stepladder are locked into place.  Ask someone to hold it for you, if possible.  Clearly mark and make sure that you can see:  Any grab bars or handrails.  First and last steps.  Where the edge of each step is.  Use tools that help you move around (mobility aids) if they are needed. These include:  Canes.  Walkers.  Scooters.  Crutches.  Turn on the lights when you go into a dark area. Replace any light bulbs as soon as they burn out.  Set up your furniture so you have a clear path. Avoid moving your furniture around.  If any of your floors are uneven, fix them.  If there are any pets around you, be aware of where they are.  Review your medicines with your doctor. Some medicines can make you feel dizzy. This can increase your chance of falling. Ask your doctor what other things that you can do to help prevent falls. This information is not intended to replace advice given to you by your health care provider. Make sure you discuss any questions you have with your health care provider. Document Released: 04/12/2009 Document Revised: 11/22/2015 Document Reviewed: 07/21/2014 Elsevier Interactive Patient Education  2017 [employer].

## 2020-10-24 NOTE — Progress Notes (Signed)
Virtual Visit via Telephone Note  I connected with  Donna Woodward on 10/24/20 at  8:45 AM EDT by telephone and verified that I am speaking with the correct person using two identifiers.  Location: Patient: Home Provider: Office Persons participating in the virtual visit: patient/Nurse Health Advisor   I discussed the limitations, risks, security and privacy concerns of performing an evaluation and management service by telephone and the availability of in person appointments. The patient expressed understanding and agreed to proceed.  Interactive audio and video telecommunications were attempted between this nurse and patient, however failed, due to patient having technical difficulties OR patient did not have access to video capability.  We continued and completed visit with audio only.  Some vital signs may be absent or patient reported.   Willette Brace, LPN   Subjective:   Donna Woodward is a 65 y.o. female who presents for Medicare Annual (Subsequent) preventive examination.  Review of Systems     Cardiac Risk Factors include: advanced age (>30men, >22 women);hypertension;dyslipidemia;sedentary lifestyle;smoking/ tobacco exposure     Objective:    Today's Vitals   10/24/20 0845  PainSc: 5    There is no height or weight on file to calculate BMI.  Advanced Directives 10/24/2020 09/27/2020 02/18/2019 01/15/2017 01/13/2017 01/08/2017 01/06/2017  Does Patient Have a Medical Advance Directive? No No No No No No No  Would patient like information on creating a medical advance directive? No - Patient declined Yes (MAU/Ambulatory/Procedural Areas - Information given);No - Patient declined - No - Patient declined No - Patient declined No - Patient declined No - Patient declined    Current Medications (verified) Outpatient Encounter Medications as of 10/24/2020  Medication Sig  . acetaminophen (TYLENOL) 325 MG tablet Take 325-650 mg by mouth every 6 (six) hours as needed for mild  pain or headache.  Marland Kitchen aspirin EC 81 MG tablet Take 1 tablet (81 mg total) by mouth daily.  . Calcium Carb-Cholecalciferol (CALCIUM + D3 PO) Take 1 tablet by mouth daily with breakfast.  . fexofenadine (ALLEGRA ALLERGY) 180 MG tablet Take 1 tablet (180 mg total) by mouth daily. (Patient taking differently: Take 180 mg by mouth once as needed for allergies.)  . lisinopril (ZESTRIL) 40 MG tablet Take 1 tablet (40 mg total) by mouth daily.  . metoprolol succinate (TOPROL-XL) 100 MG 24 hr tablet TAKE 1 TABLET DAILY. TAKE WITH OR IMMEDIATELY FOLLOWING A MEAL.  Marland Kitchen NIFEdipine (PROCARDIA-XL/NIFEDICAL-XL) 30 MG 24 hr tablet Take 1 tablet (30 mg total) by mouth daily.  . Omega-3 Fatty Acids (FISH OIL) 1000 MG CAPS Take 1,000 mg by mouth in the morning and at bedtime.   . simvastatin (ZOCOR) 40 MG tablet TAKE 1 TABLET EVERY DAY  . spironolactone (ALDACTONE) 25 MG tablet Take 1 tablet (25 mg total) by mouth daily.  . cyclobenzaprine (FLEXERIL) 5 MG tablet Take one to two po qhs prn muscle spasm   No facility-administered encounter medications on file as of 10/24/2020.    Allergies (verified) Bee venom   History: Past Medical History:  Diagnosis Date  . Anxiety   . Back pain   . Coronary artery disease    CABGx3; Seen by Dr. Johnsie Cancel  . Degenerative joint disease (DJD) of lumbar spine 03/24/2016  . Depression   . GERD (gastroesophageal reflux disease)    if overweight  . Hyperlipidemia   . Hypertension    Past Surgical History:  Procedure Laterality Date  . BACK SURGERY    . CARDIAC  CATHETERIZATION N/A 06/05/2016   Procedure: Left Heart Cath and Coronary Angiography;  Surgeon: Belva Crome, MD;  Location: [place] [facility] [facility];  Service: Cardiovascular;  Laterality: N/A;  . CORONARY ARTERY BYPASS GRAFT N/A 06/17/2016   Procedure: CORONARY ARTERY BYPASS GRAFTING (CABG), ON PUMP, TIMES THREE, USING LEFT INTERNAL MAMMARY ARTERY, RIGHT GREATER SAPHENOUS VEIN HARVESTED ENDOSCOPICALLY;  Surgeon: Ivin Poot, MD;  Location: [facility];  Service: Open Heart Surgery;  Laterality: N/A;  . MULTIPLE TOOTH EXTRACTIONS    . [place]   lamnectomy L5S1  . TEE WITHOUT CARDIOVERSION N/A 06/17/2016   Procedure: TRANSESOPHAGEAL ECHOCARDIOGRAM (TEE);  Surgeon: Ivin Poot, MD;  Location: [place];  Service: Open Heart Surgery;  Laterality: N/A;   Family History  Problem Relation Age of Onset  . Hypertension Mother   . Stroke Father   . Heart disease Father   . Dementia Father   . Hypertension Sister   . CAD Sister 71       cabg  . Hypertension Sister   . Cancer Sister 24       rectal  . Hypertension Sister    Social History   Socioeconomic History  . Marital status: Single    Spouse name: Not on file  . Number of children: Not on file  . Years of education: 48  . Highest education level: Not on file  Occupational History  . Occupation: disabled    Comment: warehouse  Tobacco Use  . Smoking status: Former Smoker    Quit date: 06/30/2006    Years since quitting: 14.3  . Smokeless tobacco: Never Used  Vaping Use  . Vaping Use: Not on file  Substance and Sexual Activity  . Alcohol use: Yes    Comment: rare  . Drug use: Yes    Types: Marijuana  . Sexual activity: Not Currently  Other Topics Concern  . 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   Grew up in [place] and lived here for since 2014.    Disabled with back pain.    Social Determinants of Health   Financial Resource Strain: Low Risk   . Difficulty of Paying Living Expenses: Not hard at all  Food Insecurity: No Food Insecurity  . Worried About Charity fundraiser in the Last Year: Never true  . Ran Out of Food in the Last Year: Never true  Transportation Needs: No Transportation Needs  . Lack of Transportation (Medical): No  . Lack of Transportation (Non-Medical): No  Physical Activity: Insufficiently Active  . Days of Exercise per Week: 5 days  . Minutes  of Exercise per Session: 10 min  Stress: Stress Concern Present  . Feeling of Stress : To some extent  Social Connections: Socially Isolated  . Frequency of Communication with Friends and Family: Once a week  . Frequency of Social Gatherings with Friends and Family: Never  . Attends Religious Services: Never  . Active Member of Clubs or Organizations: No  . Attends Archivist Meetings: Never  . Marital Status: Never married    Tobacco Counseling Counseling given: Not Answered   Clinical Intake:  Pre-visit preparation completed: Yes  Pain : 0-10 Pain Score: 5  Pain Type: Chronic pain Pain Location: Back Pain Descriptors / Indicators: Throbbing,Sharp,Spasm Pain Onset: More than a month ago Pain Frequency: Intermittent     BMI - recorded: 29.7 Nutritional Status: BMI 25 -29  Overweight Nutritional Risks: None Diabetes: No  How often do you need to have someone help you when you read instructions, pamphlets, or other written materials from your doctor or pharmacy?: 1 - Never  Diabetic?No  Interpreter Needed?: No  Information entered by :: Charlott Rakes, LPN   Activities of Daily Living In your present state of health, do you have any difficulty performing the following activities: 10/24/2020  Hearing? N  Vision? N  Difficulty concentrating or making decisions? N  Walking or climbing stairs? N  Dressing or bathing? N  Doing errands, shopping? N  Preparing Food and eating ? N  Using the Toilet? N  In the past six months, have you accidently leaked urine? Y  Comment with coughing  Do you have problems with loss of bowel control? N  Managing your Medications? N  Managing your Finances? N  Housekeeping or managing your Housekeeping? N  Some recent data might be hidden    Patient Care Team: Billie Ruddy, MD as PCP - General (Family Medicine) Josue Hector, MD as PCP - Cardiology (Cardiology)  Indicate any recent Medical Services you may have  received from other than [facility] providers in the past year (date may be approximate).     Assessment:   This is a routine wellness examination for Renarda.  Hearing/Vision screen  Hearing Screening   125Hz  250Hz  500Hz  1000Hz  2000Hz  3000Hz  4000Hz  6000Hz  8000Hz   Right ear:           Left ear:           Comments: Pt denies any hearing issues  Vision Screening Comments: Pt follows up with [place] [facility] for annual eye exams   Dietary issues and exercise activities discussed: Current Exercise Habits: Home exercise routine, Type of exercise: Other - see comments (PT exercises), Time (Minutes): 10, Frequency (Times/Week): 5, Weekly Exercise (Minutes/Week): 50  Goals    . Blood Pressure < 140/90    . Eat more fruits and vegetables    . Exercise 150 minutes per week (moderate activity)    . Patient Stated     Live long life       Depression Screen PHQ 2/9 Scores 10/24/2020 10/20/2019 08/23/2019 05/06/2018 11/11/2017 11/11/2017 11/06/2017  PHQ - 2 Score 1 2 4 4 4 4 3   PHQ- 9 Score - 8 13 17 16 13 8   Exception Documentation - - - - - - -    Fall Risk Fall Risk  10/24/2020 08/23/2019 11/06/2017 10/23/2016 09/11/2016  Falls in the past year? 0 0 No No No  Number falls in past yr: 0 0 - - -  Injury with Fall? 0 0 - - -  Risk for fall due to : Impaired vision - - - -  Follow up Falls prevention discussed - - - -    FALL RISK PREVENTION PERTAINING TO THE HOME:  Any stairs in or around the home? Yes  If so, are there any without handrails? No  Home free of loose throw rugs in walkways, pet beds, electrical cords, etc? Yes  Adequate lighting in your home to reduce risk of falls? Yes   ASSISTIVE DEVICES UTILIZED TO PREVENT FALLS:  Life alert? No  Use of a cane, walker or w/c? Yes  Grab bars in the bathroom? Yes  Shower chair or bench in shower? Yes  Elevated toilet seat or a handicapped toilet? No   TIMED UP AND GO:  Was the test performed? No     Cognitive Function:  6CIT Screen  10/24/2020  What Year? 0 points  What month? 0 points  Count back from 20 0 points  Months in reverse 0 points  Repeat phrase 0 points    Immunizations Immunization History  Administered Date(s) Administered  . DTaP 09/18/2010  . Influenza,inj,Quad PF,6+ Mos 05/03/2020  . PFIZER(Purple Top)SARS-COV-2 Vaccination 11/12/2019, 12/05/2019  . Tdap 11/06/2017    TDAP status: Up to date  Flu Vaccine status: Up to date   Covid-19 vaccine status: Completed vaccines  Qualifies for Shingles Vaccine? Yes   Zostavax completed No   Shingrix Completed?: No.    Education has been provided regarding the importance of this vaccine. Patient has been advised to call insurance company to determine out of pocket expense if they have not yet received this vaccine. Advised may also receive vaccine at local pharmacy or Health Dept. Verbalized acceptance and understanding.  Screening Tests Health Maintenance  Topic Date Due  . COVID-19 Vaccine (3 - Pfizer risk 4-dose series) 01/02/2020  . COLONOSCOPY (Pts 45-11yrs Insurance coverage will need to be confirmed)  06/30/2020  . PAP SMEAR-Modifier  10/24/2020 (Originally 09/21/2014)  . INFLUENZA VACCINE  01/28/2021  . MAMMOGRAM  06/26/2022  . TETANUS/TDAP  11/07/2027  . Hepatitis C Screening  Completed  . HIV Screening  Completed  . HPV VACCINES  Aged Out    Health Maintenance  Health Maintenance Due  Topic Date Due  . COVID-19 Vaccine (3 - Pfizer risk 4-dose series) 01/02/2020  . COLONOSCOPY (Pts 45-73yrs Insurance coverage will need to be confirmed)  06/30/2020    Colonoscopy: Declined at this time   Mammogram status: Completed 06/26/20. Repeat every year    Additional Screening:  Hepatitis C Screening: Completed 01/04/18  Vision Screening: Recommended annual ophthalmology exams for early detection of glaucoma and other disorders of the eye. Is the patient up to date with their annual eye exam?  Yes  Who is the provider or what is the  name of the office in which the patient attends annual eye exams? [facility]  If pt is not established with a provider, would they like to be referred to a provider to establish care? No .   Dental Screening: Recommended annual dental exams for proper oral hygiene  Community Resource Referral / Chronic Care Management: CRR required this visit?  No   CCM required this visit?  No      Plan:     I have personally reviewed and noted the following in the patient's chart:   . Medical and social history . Use of alcohol, tobacco or illicit drugs  . Current medications and supplements . Functional ability and status . Nutritional status . Physical activity . Advanced directives . List of other physicians . Hospitalizations, surgeries, and ER visits in previous 12 months . Vitals . Screenings to include cognitive, depression, and falls . Referrals and appointments  In addition, I have reviewed and discussed with patient certain preventive protocols, quality metrics, and best practice recommendations. A written personalized care plan for preventive services as well as general preventive health recommendations were provided to patient.     Willette Brace, LPN   [id number]   Nurse Notes: None

## 2020-10-26 ENCOUNTER — Encounter: Payer: Medicare HMO | Admitting: Physical Therapy

## 2020-10-29 ENCOUNTER — Other Ambulatory Visit: Payer: Self-pay

## 2020-10-29 ENCOUNTER — Ambulatory Visit: Payer: Medicare HMO | Admitting: Cardiovascular Disease

## 2020-10-29 ENCOUNTER — Encounter: Payer: Self-pay | Admitting: Cardiovascular Disease

## 2020-10-29 VITALS — BP 122/78 | HR 65 | Ht 64.0 in | Wt 173.0 lb

## 2020-10-29 DIAGNOSIS — Z951 Presence of aortocoronary bypass graft: Secondary | ICD-10-CM | POA: Diagnosis not present

## 2020-10-29 DIAGNOSIS — E876 Hypokalemia: Secondary | ICD-10-CM | POA: Diagnosis not present

## 2020-10-29 DIAGNOSIS — I10 Essential (primary) hypertension: Secondary | ICD-10-CM | POA: Diagnosis not present

## 2020-10-29 NOTE — Patient Instructions (Signed)

## 2020-10-30 ENCOUNTER — Ambulatory Visit: Payer: Medicare HMO | Attending: Family Medicine | Admitting: Physical Therapy

## 2020-10-30 ENCOUNTER — Ambulatory Visit: Payer: Self-pay | Admitting: *Deleted

## 2020-10-30 DIAGNOSIS — M545 Low back pain, unspecified: Secondary | ICD-10-CM | POA: Diagnosis not present

## 2020-10-30 DIAGNOSIS — G8929 Other chronic pain: Secondary | ICD-10-CM | POA: Diagnosis not present

## 2020-10-30 DIAGNOSIS — M6281 Muscle weakness (generalized): Secondary | ICD-10-CM | POA: Diagnosis not present

## 2020-10-30 NOTE — Therapy (Signed)
[facility] [address] [place], [place], [address] Phone: [phone]   Fax:  [phone]  Physical Therapy Treatment  Patient Details  Name: Donna Woodward MRN: [id number] Date of Birth: 03/29/1956 Referring Provider (PT): Dr. Grier Mitts   Encounter Date: 10/30/2020   PT End of Session - 10/30/20 1256    Visit Number 6    Number of Visits 10    Date for PT Re-Evaluation 11/22/20    Authorization Type [employer] 10 visits    PT Start Time 1225    PT Stop Time 1305    PT Time Calculation (min) 40 min    Activity Tolerance Patient tolerated treatment well           Past Medical History:  Diagnosis Date  . Anxiety   . Back pain   . Coronary artery disease    CABGx3; Seen by Dr. Johnsie Cancel  . Degenerative joint disease (DJD) of lumbar spine 03/24/2016  . Depression   . GERD (gastroesophageal reflux disease)    if overweight  . Hyperlipidemia   . Hypertension     Past Surgical History:  Procedure Laterality Date  . BACK SURGERY    . CARDIAC CATHETERIZATION N/A 06/05/2016   Procedure: Left Heart Cath and Coronary Angiography;  Surgeon: Belva Crome, MD;  Location: [place] [facility];  Service: Cardiovascular;  Laterality: N/A;  . CORONARY ARTERY BYPASS GRAFT N/A 06/17/2016   Procedure: CORONARY ARTERY BYPASS GRAFTING (CABG), ON PUMP, TIMES THREE, USING LEFT INTERNAL MAMMARY ARTERY, RIGHT GREATER SAPHENOUS VEIN HARVESTED ENDOSCOPICALLY;  Surgeon: Ivin Poot, MD;  Location: [place];  Service: Open Heart Surgery;  Laterality: N/A;  . MULTIPLE TOOTH EXTRACTIONS    . [place]   lamnectomy L5S1  . TEE WITHOUT CARDIOVERSION N/A 06/17/2016   Procedure: TRANSESOPHAGEAL ECHOCARDIOGRAM (TEE);  Surgeon: Ivin Poot, MD;  Location: [place];  Service: Open Heart Surgery;  Laterality: N/A;    There were no vitals filed for this visit.   Subjective Assessment - 10/30/20 1225    Subjective My BP medicine  has been worked out.  Exercise going OK I just get lazy.    Pertinent History bil tendonitis RTC; OA hands; CABG x3;  goes by [name]    Patient Stated Goals see if I can get some relief;  garden; clean house; carry groceries 4 bags; I like to build things wood; fishing    Currently in Pain? Yes    Pain Score 5     Pain Location Back    Pain Type Chronic pain              [facility] PT Assessment - 10/30/20 0001      Strength   Right Hip Flexion 4/5    Right Hip ABduction 4/5    Left Hip Flexion 4/5    Left Hip ABduction 4/5    Lumbar Flexion 4/5    Lumbar Extension 4/5                         [facility] Adult PT Treatment/Exercise - 10/30/20 0001      Therapeutic Activites    Therapeutic Activities ADL's;Other Therapeutic Activities    ADL's 2 5# modified dead lifts to knee level 10x    Other Therapeutic Activities 2 5# farmer's carries 1 min      Lumbar Exercises: Stretches   Other Lumbar Stretch Exercise ball seated roll out  for      Lumbar Exercises: Aerobic   Nustep L2 5 min      Lumbar Exercises: Machines for Strengthening   Leg Press seat 7 40# 2x 10 bil; 10x 50#    Other Lumbar Machine Exercise power tower BUE extension #15 x10 repsx2      Lumbar Exercises: Standing   Other Standing Lumbar Exercises wall push ups 20x    Other Standing Lumbar Exercises UE wall slides 10x; 5x with lift off at the top                    PT Short Term Goals - 10/30/20 1241      PT SHORT TERM GOAL #1   Title Pt will be independent with initial HEP and perform consistently to maximize return to PLOF.    Status Achieved      PT SHORT TERM GOAL #2   Title Pt will have improved MMT to 4/5 throughout BLE to demosntrate improved overall strength to maximize function and gait.    Status Achieved      PT SHORT TERM GOAL #3   Title Pt will report being able to wash dishes, vacumn or mop for 10-15 mins  showing improved tolerance to standing in order maximize  participation at home.    Baseline I have to lean    Status Achieved             PT Long Term Goals - 09/27/20 2102      PT LONG TERM GOAL #1   Title Pt will have improved 5xSTS to 15 sec or < to demonstrate improved overall BLE strength and power in order to maximize functional tasks at home and in the community.    Time 8    Period Weeks    Status New    Target Date 11/22/20      PT LONG TERM GOAL #2   Title Pt will have improved lumbar flexion and extension ROM by 50% to demonstrate improved function for gardening, fishing and woodwork and decrease pain overall.    Time 8    Period Weeks    Status New      PT LONG TERM GOAL #3   Title Pt will have improved TUG to 16 sec or < with LRAD in order to demonstrate improved overall function and maximzie participation at home and in the community.    Time 8    Period Weeks    Status On-going      PT LONG TERM GOAL #4   Title FOTO functional outcome score improved to 52%    Time 8    Period Weeks    Status New      PT LONG TERM GOAL #5   Title Trunk and LE strength grossly 4+/5 needed to rise from a chair without UE assist and greater ease negotiating steps and curbs    Time 8    Period Weeks    Status New                 Plan - 10/30/20 1256    Clinical Impression Statement The patient is able to increase functional strengthening today in moderation.  Repetitions and resistance kept low to adhere to graded exercise exposure plan.  She reports continued pain level about the same as upon arrival.  Therapist monitoring response throughout treatment session.  Progressing with STGs.    Personal Factors and Comorbidities Past/Current Experience;Time since onset of injury/illness/exacerbation;Comorbidity 1;Comorbidity 2;Comorbidity  3+    Comorbidities Cardiac surgery; previous back surgery; multi region OA; HTN; anxiety    Examination-Activity Limitations Bed Mobility;Locomotion Level;Carry;Lift;Stand;Stairs     Examination-Participation Restrictions Meal Prep;Cleaning;Community Activity;Yard Work;Laundry;Shop    Rehab Potential Good    PT Frequency 2x / week    PT Duration 8 weeks    PT Treatment/Interventions ADLs/Self Care Home Management;Aquatic Therapy;Cryotherapy;Electrical Stimulation;Ultrasound;Moist Heat;Therapeutic activities;Therapeutic exercise;Neuromuscular re-education;Manual techniques;Patient/family education;Taping;Dry needling    PT Next Visit Plan graded exposure to movement;  leg press; functional strengthening;   pain education;   hold DN    PT [place] and Agree with Plan of Care Patient           Patient will benefit from skilled therapeutic intervention in order to improve the following deficits and impairments:  Decreased range of motion,Increased fascial restricitons,Pain,Decreased strength,Decreased activity tolerance,Impaired flexibility  Visit Diagnosis: Chronic bilateral low back pain without sciatica  Muscle weakness (generalized)     Problem List Patient Active Problem List   Diagnosis Date Noted  . Near syncope 05/02/2020  . Anxiety 11/06/2017  . S/P CABG x 3 06/17/2016  . Family history of early CAD 06/04/2016  . Chest pain 06/04/2016  . Abnormal nuclear stress test 06/04/2016  . Degenerative joint disease (DJD) of lumbar spine 03/24/2016  . Depression 03/24/2016  . Obesity 08/14/2015  . Essential hypertension, benign 04/29/2015  . Hyperlipidemia 04/29/2015   Ruben Im, PT 10/30/20 1:16 PM Phone: [phone] Fax: [phone] Alvera Singh 10/30/2020, 1:15 PM  [facility] [address], [place], [place], [address] Phone: [phone]   Fax:  [phone]  Name: Donna Woodward MRN: [id number] Date of Birth: Jul 02, 1955

## 2020-10-30 NOTE — Telephone Encounter (Signed)
Patient states she was out mowing yesterday and got bitten by insects- possible spider. She reports several areas of concern- but her main concern is an area on her neck that is swollen, red, warm. Patient denies trouble breathing or swallowing. Advised Benadryl, OTC antihistamines- she will speak with pharmacist and contact PCP tomorrow.   Reason for Disposition . [1] Red or very tender (to touch) area AND [2] getting larger over 48 hours after the bite  Answer Assessment - Initial Assessment Questions 1. TYPE of INSECT: "What type of insect was it?"      unsure 2. ONSET: "When did you get bitten?"      Last night 3. LOCATION: "Where is the insect bite located?"      Neck- front 4. REDNESS: "Is the area red or pink?" If Yes, ask: "What size is area of redness?" (inches or cm). "When did the redness start?"     Red area- 2"x3"x1/2 ' thick 5. PAIN: "Is there any pain?" If Yes, ask: "How bad is it?"  (Scale 1-10; or mild, moderate, severe)     Not painful 6. ITCHING: "Does it itch?" If Yes, ask: "How bad is the itch?"    - MILD: doesn't interfere with normal activities   - MODERATE-SEVERE: interferes with work, school, sleep, or other activities      Itching- mild/moderate 7. SWELLING: "How big is the swelling?" (inches, cm, or compare to coins)     Yes- see above 8. OTHER SYMPTOMS: "Do you have any other symptoms?"  (e.g., difficulty breathing, hives)     no 9. PREGNANCY: "Is there any chance you are pregnant?" "When was your last menstrual period?"     n/a  Protocols used: INSECT BITE-A-AH

## 2020-11-01 ENCOUNTER — Ambulatory Visit: Payer: Medicare HMO | Admitting: Physical Therapy

## 2020-11-01 ENCOUNTER — Other Ambulatory Visit: Payer: Self-pay

## 2020-11-01 DIAGNOSIS — M545 Low back pain, unspecified: Secondary | ICD-10-CM | POA: Diagnosis not present

## 2020-11-01 DIAGNOSIS — M6281 Muscle weakness (generalized): Secondary | ICD-10-CM

## 2020-11-01 DIAGNOSIS — G8929 Other chronic pain: Secondary | ICD-10-CM | POA: Diagnosis not present

## 2020-11-01 NOTE — Therapy (Signed)
[facility] [address] [place], [place], [address] Phone: [phone]   Fax:  [phone]  Physical Therapy Treatment  Patient Details  Name: Donna Woodward MRN: [id number] Date of Birth: Nov 06, 1955 Referring Provider (PT): Dr. Grier Mitts   Encounter Date: 11/01/2020   PT End of Session - 11/01/20 0855    Visit Number 7    Number of Visits 10    Date for PT Re-Evaluation 11/22/20    Authorization Type [employer] 10 visits    PT Start Time 0845    PT Stop Time 0923    PT Time Calculation (min) 38 min    Activity Tolerance Patient tolerated treatment well           Past Medical History:  Diagnosis Date  . Anxiety   . Back pain   . Coronary artery disease    CABGx3; Seen by Dr. Johnsie Cancel  . Degenerative joint disease (DJD) of lumbar spine 03/24/2016  . Depression   . GERD (gastroesophageal reflux disease)    if overweight  . Hyperlipidemia   . Hypertension     Past Surgical History:  Procedure Laterality Date  . BACK SURGERY    . CARDIAC CATHETERIZATION N/A 06/05/2016   Procedure: Left Heart Cath and Coronary Angiography;  Surgeon: Belva Crome, MD;  Location: [place] [facility];  Service: Cardiovascular;  Laterality: N/A;  . CORONARY ARTERY BYPASS GRAFT N/A 06/17/2016   Procedure: CORONARY ARTERY BYPASS GRAFTING (CABG), ON PUMP, TIMES THREE, USING LEFT INTERNAL MAMMARY ARTERY, RIGHT GREATER SAPHENOUS VEIN HARVESTED ENDOSCOPICALLY;  Surgeon: Ivin Poot, MD;  Location: [place];  Service: Open Heart Surgery;  Laterality: N/A;  . MULTIPLE TOOTH EXTRACTIONS    . [place]   lamnectomy L5S1  . TEE WITHOUT CARDIOVERSION N/A 06/17/2016   Procedure: TRANSESOPHAGEAL ECHOCARDIOGRAM (TEE);  Surgeon: Ivin Poot, MD;  Location: [place];  Service: Open Heart Surgery;  Laterality: N/A;    There were no vitals filed for this visit.   Subjective Assessment - 11/01/20 0845    Subjective You killed me on  Tuesday.  I think it was the leg press.  It hurt my back.    Pertinent History bil tendonitis RTC; OA hands; CABG x3;  goes by [name]    Patient Stated Goals see if I can get some relief;  garden; clean house; carry groceries 4 bags; I like to build things wood; fishing    Currently in Pain? Yes    Pain Score 6     Pain Location Back    Pain Orientation Lower    Pain Type Chronic pain    Multiple Pain Sites Yes    Pain Score 5    Pain Location Knee    Pain Orientation Right;Medial                             [facility] Adult PT Treatment/Exercise - 11/01/20 0001      Lumbar Exercises: Aerobic   Nustep L1 5 min      Lumbar Exercises: Machines for Strengthening   Cybex Knee Flexion 20# 10x bil      Lumbar Exercises: Standing   Other Standing Lumbar Exercises counter push ups 10x      Lumbar Exercises: Seated   Other Seated Lumbar Exercises red band rows 15x bil low      Lumbar Exercises: Supine   Other Supine Lumbar Exercises  bil red band pull downs 10x    Other Supine Lumbar Exercises holding red band at 90 degrees with marching 10x      Lumbar Exercises: Sidelying   Other Sidelying Lumbar Exercises partial open books 10x      Knee/Hip Exercises: Standing   Other Standing Knee Exercises towel slides on floor 3 ways 5x right/left                  PT Education - 11/01/20 1737    Education Details open books, counter push ups; seated bil banded row    Person(s) Educated Patient    Methods Explanation;Demonstration;Handout    Comprehension Returned demonstration;Verbalized understanding            PT Short Term Goals - 10/30/20 1241      PT SHORT TERM GOAL #1   Title Pt will be independent with initial HEP and perform consistently to maximize return to PLOF.    Status Achieved      PT SHORT TERM GOAL #2   Title Pt will have improved MMT to 4/5 throughout BLE to demosntrate improved overall strength to maximize function and gait.    Status  Achieved      PT SHORT TERM GOAL #3   Title Pt will report being able to wash dishes, vacumn or mop for 10-15 mins  showing improved tolerance to standing in order maximize participation at home.    Baseline I have to lean    Status Achieved             PT Long Term Goals - 09/27/20 2102      PT LONG TERM GOAL #1   Title Pt will have improved 5xSTS to 15 sec or < to demonstrate improved overall BLE strength and power in order to maximize functional tasks at home and in the community.    Time 8    Period Weeks    Status New    Target Date 11/22/20      PT LONG TERM GOAL #2   Title Pt will have improved lumbar flexion and extension ROM by 50% to demonstrate improved function for gardening, fishing and woodwork and decrease pain overall.    Time 8    Period Weeks    Status New      PT LONG TERM GOAL #3   Title Pt will have improved TUG to 16 sec or < with LRAD in order to demonstrate improved overall function and maximzie participation at home and in the community.    Time 8    Period Weeks    Status On-going      PT LONG TERM GOAL #4   Title FOTO functional outcome score improved to 52%    Time 8    Period Weeks    Status New      PT LONG TERM GOAL #5   Title Trunk and LE strength grossly 4+/5 needed to rise from a chair without UE assist and greater ease negotiating steps and curbs    Time 8    Period Weeks    Status New                 Plan - 11/01/20 0932    Clinical Impression Statement The patient reports excessive soreness following last visit so modified intensity and focused on activation of other muscle groups.  Included more supine/unloaded positioning as well as a pain modulation effort.  Her pain level remains in the moderate range prior  to, during and following treatment session.    Personal Factors and Comorbidities Past/Current Experience;Time since onset of injury/illness/exacerbation;Comorbidity 1;Comorbidity 2;Comorbidity 3+    Comorbidities  Cardiac surgery; previous back surgery; multi region OA; HTN; anxiety    Examination-Activity Limitations Bed Mobility;Locomotion Level;Carry;Lift;Stand;Stairs    Examination-Participation Restrictions Meal Prep;Cleaning;Community Activity;Yard Work;Laundry;Shop    Stability/Clinical Decision Making Evolving/Moderate complexity    Rehab Potential Good    PT Frequency 2x / week    PT Duration 8 weeks    PT Treatment/Interventions ADLs/Self Care Home Management;Aquatic Therapy;Cryotherapy;Electrical Stimulation;Ultrasound;Moist Heat;Therapeutic activities;Therapeutic exercise;Neuromuscular re-education;Manual techniques;Patient/family education;Taping;Dry needling    PT Next Visit Plan graded exposure to movement;  leg press; functional strengthening;   pain education;   hold DN    PT Home Exercise Plan [email or web address]           Patient will benefit from skilled therapeutic intervention in order to improve the following deficits and impairments:  Decreased range of motion,Increased fascial restricitons,Pain,Decreased strength,Decreased activity tolerance,Impaired flexibility  Visit Diagnosis: Chronic bilateral low back pain without sciatica  Muscle weakness (generalized)     Problem List Patient Active Problem List   Diagnosis Date Noted  . Near syncope 05/02/2020  . Anxiety 11/06/2017  . S/P CABG x 3 06/17/2016  . Family history of early CAD 06/04/2016  . Chest pain 06/04/2016  . Abnormal nuclear stress test 06/04/2016  . Degenerative joint disease (DJD) of lumbar spine 03/24/2016  . Depression 03/24/2016  . Obesity 08/14/2015  . Essential hypertension, benign 04/29/2015  . Hyperlipidemia 04/29/2015   Ruben Im, PT 11/01/20 5:38 PM Phone: [phone] Fax: [phone] Alvera Singh 11/01/2020, 5:38 PM  [facility] [address], [place], [place], [address] Phone: [phone]   Fax:  [phone]  Name:  Donna Woodward MRN: [id number] Date of Birth: Nov 28, 1955

## 2020-11-01 NOTE — Patient Instructions (Signed)
Access Code: [email or web address] URL: [email or web address] Date: 11/01/2020 Prepared by: Ruben Im  Exercises Supine Lower Trunk Rotation - 2 x daily - 7 x weekly - 1 sets - 5 reps - 5 hold Supine Butterfly Groin Stretch - 2 x daily - 7 x weekly - 1 sets - 5 reps - 5 hold supine Hamstring stretch - 1 x daily - 7 x weekly - 1 sets - 5 reps Seated Hip Abduction with Resistance - 1 x daily - 7 x weekly - 3 sets - 5 reps Forearm Plank on Wall - 1 x daily - 7 x weekly - 1 sets - 5 reps - 5 hold Seated High Lat Pull Down with Overhead Anchored Resistance - 1 x daily - 7 x weekly - 1 sets - 10 reps Shoulder extension with resistance - Neutral - 1 x daily - 7 x weekly - 1 sets - 10 reps Sidelying Open Book Thoracic Lumbar Rotation and Extension - 1 x daily - 7 x weekly - 1 sets - 10 reps Push-Up on Counter - 1 x daily - 7 x weekly - 1 sets - 10 reps Seated Shoulder Row with Anchored Resistance - 1 x daily - 7 x weekly - 1 sets - 10 reps

## 2020-11-06 ENCOUNTER — Ambulatory Visit: Payer: Medicare HMO | Admitting: Physical Therapy

## 2020-11-06 ENCOUNTER — Other Ambulatory Visit: Payer: Self-pay

## 2020-11-06 DIAGNOSIS — M545 Low back pain, unspecified: Secondary | ICD-10-CM | POA: Diagnosis not present

## 2020-11-06 DIAGNOSIS — G8929 Other chronic pain: Secondary | ICD-10-CM | POA: Diagnosis not present

## 2020-11-06 DIAGNOSIS — M6281 Muscle weakness (generalized): Secondary | ICD-10-CM | POA: Diagnosis not present

## 2020-11-06 NOTE — Therapy (Signed)
[facility] [address], [place], [place], [address] Phone: [phone]   Fax:  [phone]  Physical Therapy Treatment  Patient Details  Name: Donna Woodward MRN: [id number] Date of Birth: 04-05-1956 Referring Provider (PT): Dr. Grier Mitts   Encounter Date: 11/06/2020   PT End of Session - 11/06/20 1555    Visit Number 8    Number of Visits 10    Date for PT Re-Evaluation 11/22/20    Authorization Type [employer] 10 visits    PT Start Time 9323    PT Stop Time 1610    PT Time Calculation (min) 39 min    Activity Tolerance Patient tolerated treatment well           Past Medical History:  Diagnosis Date  . Anxiety   . Back pain   . Coronary artery disease    CABGx3; Seen by Dr. Johnsie Cancel  . Degenerative joint disease (DJD) of lumbar spine 03/24/2016  . Depression   . GERD (gastroesophageal reflux disease)    if overweight  . Hyperlipidemia   . Hypertension     Past Surgical History:  Procedure Laterality Date  . BACK SURGERY    . CARDIAC CATHETERIZATION N/A 06/05/2016   Procedure: Left Heart Cath and Coronary Angiography;  Surgeon: Belva Crome, MD;  Location: [place] [facility];  Service: Cardiovascular;  Laterality: N/A;  . CORONARY ARTERY BYPASS GRAFT N/A 06/17/2016   Procedure: CORONARY ARTERY BYPASS GRAFTING (CABG), ON PUMP, TIMES THREE, USING LEFT INTERNAL MAMMARY ARTERY, RIGHT GREATER SAPHENOUS VEIN HARVESTED ENDOSCOPICALLY;  Surgeon: Ivin Poot, MD;  Location: [place];  Service: Open Heart Surgery;  Laterality: N/A;  . MULTIPLE TOOTH EXTRACTIONS    . [place]   lamnectomy L5S1  . TEE WITHOUT CARDIOVERSION N/A 06/17/2016   Procedure: TRANSESOPHAGEAL ECHOCARDIOGRAM (TEE);  Surgeon: Ivin Poot, MD;  Location: [employer];  Service: Open Heart Surgery;  Laterality: N/A;    There were no vitals filed for this visit.   Subjective Assessment - 11/06/20 1533    Subjective My back is  there...same as always.    Pertinent History bil tendonitis RTC; OA hands; CABG x3;  goes by Donna Woodward    Patient Stated Goals see if I can get some relief;  garden; clean house; carry groceries 4 bags; I like to build things wood; fishing    Currently in Pain? Yes    Pain Score 5     Pain Location Back    Pain Type Chronic pain                             [facility] Adult PT Treatment/Exercise - 11/06/20 0001      Lumbar Exercises: Aerobic   Nustep L1 5 min      Lumbar Exercises: Machines for Strengthening   Cybex Knee Flexion 15# 10x2 bil      Lumbar Exercises: Standing   Other Standing Lumbar Exercises counter push ups 10x    Other Standing Lumbar Exercises 2 3# weights 8x      Lumbar Exercises: Supine   Other Supine Lumbar Exercises bil red band pull downs 10x    Other Supine Lumbar Exercises holding red band at 90 degrees with clams 10x      Lumbar Exercises: Sidelying   Other Sidelying Lumbar Exercises partial open books 10x      Knee/Hip Exercises: Standing   Forward Step  Up Right;Left;1 set;10 reps;Hand Hold: 2;Step Height: 2"                    PT Short Term Goals - 10/30/20 1241      PT SHORT TERM GOAL #1   Title Pt will be independent with initial HEP and perform consistently to maximize return to PLOF.    Status Achieved      PT SHORT TERM GOAL #2   Title Pt will have improved MMT to 4/5 throughout BLE to demosntrate improved overall strength to maximize function and gait.    Status Achieved      PT SHORT TERM GOAL #3   Title Pt will report being able to wash dishes, vacumn or mop for 10-15 mins  showing improved tolerance to standing in order maximize participation at home.    Baseline I have to lean    Status Achieved             PT Long Term Goals - 09/27/20 2102      PT LONG TERM GOAL #1   Title Pt will have improved 5xSTS to 15 sec or < to demonstrate improved overall BLE strength and power in order to maximize functional  tasks at home and in the community.    Time 8    Period Weeks    Status New    Target Date 11/22/20      PT LONG TERM GOAL #2   Title Pt will have improved lumbar flexion and extension ROM by 50% to demonstrate improved function for gardening, fishing and woodwork and decrease pain overall.    Time 8    Period Weeks    Status New      PT LONG TERM GOAL #3   Title Pt will have improved TUG to 16 sec or < with LRAD in order to demonstrate improved overall function and maximzie participation at home and in the community.    Time 8    Period Weeks    Status On-going      PT LONG TERM GOAL #4   Title FOTO functional outcome score improved to 52%    Time 8    Period Weeks    Status New      PT LONG TERM GOAL #5   Title Trunk and LE strength grossly 4+/5 needed to rise from a chair without UE assist and greater ease negotiating steps and curbs    Time 8    Period Weeks    Status New                 Plan - 11/06/20 1556    Clinical Impression Statement Graded exercise secondary to painful movement.  Frequent change of position for pain modulation.  Verbal cues for head positioning with modified dead lift.  Therapist monitoring response throughout treatment session and modifying accordingly.    Personal Factors and Comorbidities Past/Current Experience;Time since onset of injury/illness/exacerbation;Comorbidity 1;Comorbidity 2;Comorbidity 3+    Comorbidities Cardiac surgery; previous back surgery; multi region OA; HTN; anxiety    Rehab Potential Good    PT Frequency 2x / week    PT Duration 8 weeks    PT Treatment/Interventions ADLs/Self Care Home Management;Aquatic Therapy;Cryotherapy;Electrical Stimulation;Ultrasound;Moist Heat;Therapeutic activities;Therapeutic exercise;Neuromuscular re-education;Manual techniques;Patient/family education;Taping;Dry needling    PT Next Visit Plan graded exposure to movement; Nu-Step; HS curls; modified dead lift; functional strengthening;    pain education;   hold DN    PT Tat [place]  Patient will benefit from skilled therapeutic intervention in order to improve the following deficits and impairments:  Decreased range of motion,Increased fascial restricitons,Pain,Decreased strength,Decreased activity tolerance,Impaired flexibility  Visit Diagnosis: Chronic bilateral low back pain without sciatica  Muscle weakness (generalized)     Problem List Patient Active Problem List   Diagnosis Date Noted  . Near syncope 05/02/2020  . Anxiety 11/06/2017  . S/P CABG x 3 06/17/2016  . Family history of early CAD 06/04/2016  . Chest pain 06/04/2016  . Abnormal nuclear stress test 06/04/2016  . Degenerative joint disease (DJD) of lumbar spine 03/24/2016  . Depression 03/24/2016  . Obesity 08/14/2015  . Essential hypertension, benign 04/29/2015  . Hyperlipidemia 04/29/2015   Ruben Im, PT 11/06/20 4:07 PM Phone: [phone] Fax: [phone] Alvera Singh 11/06/2020, 4:07 PM  [facility] [address] [place], [place], [address] Phone: [phone]   Fax:  [phone]  Name: Donna Woodward MRN: [id number] Date of Birth: Jul 26, 1955

## 2020-11-08 ENCOUNTER — Other Ambulatory Visit: Payer: Self-pay

## 2020-11-08 ENCOUNTER — Ambulatory Visit: Payer: Medicare HMO | Admitting: Physical Therapy

## 2020-11-08 DIAGNOSIS — M6281 Muscle weakness (generalized): Secondary | ICD-10-CM | POA: Diagnosis not present

## 2020-11-08 DIAGNOSIS — G8929 Other chronic pain: Secondary | ICD-10-CM | POA: Diagnosis not present

## 2020-11-08 DIAGNOSIS — M545 Low back pain, unspecified: Secondary | ICD-10-CM

## 2020-11-08 NOTE — Therapy (Signed)
[facility] [address] [place], [place], [address] Phone: [phone]   Fax:  [phone]  Physical Therapy Treatment  Patient Details  Name: Donna Woodward MRN: [id number] Date of Birth: 05-Jul-1955 Referring Provider (PT): Dr. Grier Mitts   Encounter Date: 11/08/2020   PT End of Session - 11/08/20 0908    Visit Number 9    Number of Visits 12    Date for PT Re-Evaluation 11/22/20    Authorization Type [employer] 12 visits ends 5/26    PT Start Time 0852   pt late   PT Stop Time 0930    PT Time Calculation (min) 38 min    Activity Tolerance Patient tolerated treatment well           Past Medical History:  Diagnosis Date  . Anxiety   . Back pain   . Coronary artery disease    CABGx3; Seen by Dr. Johnsie Cancel  . Degenerative joint disease (DJD) of lumbar spine 03/24/2016  . Depression   . GERD (gastroesophageal reflux disease)    if overweight  . Hyperlipidemia   . Hypertension     Past Surgical History:  Procedure Laterality Date  . BACK SURGERY    . CARDIAC CATHETERIZATION N/A 06/05/2016   Procedure: Left Heart Cath and Coronary Angiography;  Surgeon: Belva Crome, MD;  Location: [place] [facility];  Service: Cardiovascular;  Laterality: N/A;  . CORONARY ARTERY BYPASS GRAFT N/A 06/17/2016   Procedure: CORONARY ARTERY BYPASS GRAFTING (CABG), ON PUMP, TIMES THREE, USING LEFT INTERNAL MAMMARY ARTERY, RIGHT GREATER SAPHENOUS VEIN HARVESTED ENDOSCOPICALLY;  Surgeon: Ivin Poot, MD;  Location: [place];  Service: Open Heart Surgery;  Laterality: N/A;  . MULTIPLE TOOTH EXTRACTIONS    . [place]   lamnectomy L5S1  . TEE WITHOUT CARDIOVERSION N/A 06/17/2016   Procedure: TRANSESOPHAGEAL ECHOCARDIOGRAM (TEE);  Surgeon: Ivin Poot, MD;  Location: [place];  Service: Open Heart Surgery;  Laterality: N/A;    There were no vitals filed for this visit.   Subjective Assessment - 11/08/20 0852     Subjective My back was killing me yesterday so I slept all day yesterday.  Pt cannot identify any particular exercise that bothered her in PT.   It could have been the weather-it was windy.  I couldn't get out of the chair yesterday.    Pertinent History bil tendonitis RTC; OA hands; CABG x3;  goes by [name]    How long can you walk comfortably? 10 minutes with walking stick  (not indoors)    Patient Stated Goals see if I can get some relief;  garden; clean house; carry groceries 4 bags; I like to build things wood; fishing    Currently in Pain? Yes    Pain Score 5     Pain Location Back    Pain Orientation Right;Left;Lower    Pain Type Chronic pain                             [facility] Adult PT Treatment/Exercise - 11/08/20 0001      Lumbar Exercises: Aerobic   Nustep L1 8 min full lap      Lumbar Exercises: Standing   Other Standing Lumbar Exercises resisted backwards walk 20# 10x      Lumbar Exercises: Seated   Sit to Stand 10 reps    Sit to Stand Limitations from mat table no UEs  Lumbar Exercises: Supine   Other Supine Lumbar Exercises decompression series: shoulder press, long leg; whole leg press down 5x right/left    Other Supine Lumbar Exercises red band overhead, horizontal abduction, sash 5x each                    PT Short Term Goals - 10/30/20 1241      PT SHORT TERM GOAL #1   Title Pt will be independent with initial HEP and perform consistently to maximize return to PLOF.    Status Achieved      PT SHORT TERM GOAL #2   Title Pt will have improved MMT to 4/5 throughout BLE to demosntrate improved overall strength to maximize function and gait.    Status Achieved      PT SHORT TERM GOAL #3   Title Pt will report being able to wash dishes, vacumn or mop for 10-15 mins  showing improved tolerance to standing in order maximize participation at home.    Baseline I have to lean    Status Achieved             PT Long Term Goals -  09/27/20 2102      PT LONG TERM GOAL #1   Title Pt will have improved 5xSTS to 15 sec or < to demonstrate improved overall BLE strength and power in order to maximize functional tasks at home and in the community.    Time 8    Period Weeks    Status New    Target Date 11/22/20      PT LONG TERM GOAL #2   Title Pt will have improved lumbar flexion and extension ROM by 50% to demonstrate improved function for gardening, fishing and woodwork and decrease pain overall.    Time 8    Period Weeks    Status New      PT LONG TERM GOAL #3   Title Pt will have improved TUG to 16 sec or < with LRAD in order to demonstrate improved overall function and maximzie participation at home and in the community.    Time 8    Period Weeks    Status On-going      PT LONG TERM GOAL #4   Title FOTO functional outcome score improved to 52%    Time 8    Period Weeks    Status New      PT LONG TERM GOAL #5   Title Trunk and LE strength grossly 4+/5 needed to rise from a chair without UE assist and greater ease negotiating steps and curbs    Time 8    Period Weeks    Status New                 Plan - 11/08/20 1032    Clinical Impression Statement Continued graded exercise and pain modulation type ex's (decompression series).  Her pain level remains consistent at a 5/10.  Therapist monitoring response and modifying treatment accordingly.  Will check progress toward goals next visit to determine future plan.    Personal Factors and Comorbidities Past/Current Experience;Time since onset of injury/illness/exacerbation;Comorbidity 1;Comorbidity 2;Comorbidity 3+    Comorbidities Cardiac surgery; previous back surgery; multi region OA; HTN; anxiety    Examination-Activity Limitations Bed Mobility;Locomotion Level;Carry;Lift;Stand;Stairs    Rehab Potential Good    PT Frequency 2x / week    PT Duration 8 weeks    PT Treatment/Interventions ADLs/Self Care Home Management;Aquatic  Therapy;Cryotherapy;Electrical Stimulation;Ultrasound;Moist Heat;Therapeutic activities;Therapeutic exercise;Neuromuscular  re-education;Manual techniques;Patient/family education;Taping;Dry needling    PT Next Visit Plan 10th visit;  assess progress toward goals; FOTO;  TUG; lumbar ROM;  graded exposure to movement; Nu-Step; HS curls; modified dead lift; functional strengthening;   pain education;   hold DN    PT Home Exercise Plan [email or web address]           Patient will benefit from skilled therapeutic intervention in order to improve the following deficits and impairments:  Decreased range of motion,Increased fascial restricitons,Pain,Decreased strength,Decreased activity tolerance,Impaired flexibility  Visit Diagnosis: Chronic bilateral low back pain without sciatica  Muscle weakness (generalized)     Problem List Patient Active Problem List   Diagnosis Date Noted  . Near syncope 05/02/2020  . Anxiety 11/06/2017  . S/P CABG x 3 06/17/2016  . Family history of early CAD 06/04/2016  . Chest pain 06/04/2016  . Abnormal nuclear stress test 06/04/2016  . Degenerative joint disease (DJD) of lumbar spine 03/24/2016  . Depression 03/24/2016  . Obesity 08/14/2015  . Essential hypertension, benign 04/29/2015  . Hyperlipidemia 04/29/2015   Ruben Im, PT 11/08/20 10:40 AM Phone: [phone] Fax: [phone] Alvera Singh 11/08/2020, 10:40 AM  [facility] [address], [place], [place], [address] Phone: [phone]   Fax:  [phone]  Name: Donna Woodward MRN: [id number] Date of Birth: 1955/07/13

## 2020-11-10 DIAGNOSIS — I10 Essential (primary) hypertension: Secondary | ICD-10-CM

## 2020-11-12 MED ORDER — SPIRONOLACTONE 25 MG PO TABS: 25.0000 mg | ORAL_TABLET | Freq: Every day | ORAL | 3 refills | Status: DC

## 2020-11-13 ENCOUNTER — Ambulatory Visit: Payer: Medicare HMO | Admitting: Physical Therapy

## 2020-11-13 ENCOUNTER — Other Ambulatory Visit: Payer: Self-pay

## 2020-11-13 DIAGNOSIS — M545 Low back pain, unspecified: Secondary | ICD-10-CM | POA: Diagnosis not present

## 2020-11-13 DIAGNOSIS — G8929 Other chronic pain: Secondary | ICD-10-CM

## 2020-11-13 DIAGNOSIS — M6281 Muscle weakness (generalized): Secondary | ICD-10-CM | POA: Diagnosis not present

## 2020-11-13 NOTE — Therapy (Signed)
[facility] [address], [place], [place], [address] Phone: [phone]   Fax:  [phone]  Physical Therapy Treatment/Discharge Summary   Patient Details  Name: Donna Woodward MRN: [id number] Date of Birth: Sep 19, 1955 Referring Provider (PT): Dr. Grier Mitts  Progress Note Reporting Period 09/27/20 to 11/13/20  See note below for Objective Data and Assessment of Progress/Goals.      Encounter Date: 11/13/2020   PT End of Session - 11/13/20 1122    Visit Number 10    Number of Visits 12    Date for PT Re-Evaluation 11/22/20    Authorization Type [employer] 12 visits ends 5/26    PT Start Time 0758    PT Stop Time 0836    PT Time Calculation (min) 38 min    Activity Tolerance Patient tolerated treatment well           Past Medical History:  Diagnosis Date  . Anxiety   . Back pain   . Coronary artery disease    CABGx3; Seen by Dr. Johnsie Cancel  . Degenerative joint disease (DJD) of lumbar spine 03/24/2016  . Depression   . GERD (gastroesophageal reflux disease)    if overweight  . Hyperlipidemia   . Hypertension     Past Surgical History:  Procedure Laterality Date  . BACK SURGERY    . CARDIAC CATHETERIZATION N/A 06/05/2016   Procedure: Left Heart Cath and Coronary Angiography;  Surgeon: Belva Crome, MD;  Location: [facility];  Service: Cardiovascular;  Laterality: N/A;  . CORONARY ARTERY BYPASS GRAFT N/A 06/17/2016   Procedure: CORONARY ARTERY BYPASS GRAFTING (CABG), ON PUMP, TIMES THREE, USING LEFT INTERNAL MAMMARY ARTERY, RIGHT GREATER SAPHENOUS VEIN HARVESTED ENDOSCOPICALLY;  Surgeon: Ivin Poot, MD;  Location: [facility];  Service: Open Heart Surgery;  Laterality: N/A;  . MULTIPLE TOOTH EXTRACTIONS    . [facility]   lamnectomy L5S1  . TEE WITHOUT CARDIOVERSION N/A 06/17/2016   Procedure: TRANSESOPHAGEAL ECHOCARDIOGRAM (TEE);  Surgeon: Ivin Poot, MD;  Location: [facility];  Service:  Open Heart Surgery;  Laterality: N/A;    There were no vitals filed for this visit.   Subjective Assessment - 11/13/20 0804    Subjective I'm OK.  My back really hurt Thursday and Friday.    Pertinent History bil tendonitis RTC; OA hands; CABG x3;  goes [name]    Patient Stated Goals see if I can get some relief;  garden; clean house; carry groceries 4 bags; I like to build things wood; fishing    Currently in Pain? Yes    Pain Score 4     Pain Location Back    Pain Type Chronic pain              [facility] PT Assessment - 11/13/20 0001      Observation/Other Assessments   Focus on Therapeutic Outcomes (FOTO)  40%      AROM   Lumbar Flexion 38    Lumbar Extension 18    Lumbar - Right Side Bend 35    Lumbar - Left Side Bend 30      Strength   Right Hip Flexion 4/5    Right Hip ABduction 4/5    Left Hip Flexion 4/5    Left Hip ABduction 4/5    Lumbar Flexion 4/5    Lumbar Extension 4/5      Flexibility   Hamstrings right 70; left 72  Standardized Balance Assessment   Five times sit to stand comments  light UE on thighs 29.71      Timed Up and Go Test   Normal TUG (seconds) 17.58                         [facility] Adult PT Treatment/Exercise - 11/13/20 0001      Lumbar Exercises: Aerobic   Nustep L1 5 min while discussing status/progress      Lumbar Exercises: Seated   Sit to Stand 5 reps    Sit to Stand Limitations added to HEP    Other Seated Lumbar Exercises comprehensive review of HEP to discuss graded exercise/activity building up over time to avoid "roller coaster" effect of too much at a time                  PT Education - 11/13/20 0831    Education Details sit to stand; step ups    Person(s) Educated Patient    Methods Explanation;Demonstration;Handout    Comprehension Returned demonstration;Verbalized understanding            PT Short Term Goals - 11/13/20 1130      PT SHORT TERM GOAL #1   Title Pt will be independent  with initial HEP and perform consistently to maximize return to PLOF.    Status Achieved      PT SHORT TERM GOAL #2   Title Pt will have improved MMT to 4/5 throughout BLE to demosntrate improved overall strength to maximize function and gait.    Status Achieved      PT SHORT TERM GOAL #3   Title Pt will report being able to wash dishes, vacumn or mop for 10-15 mins  showing improved tolerance to standing in order maximize participation at home.    Status Achieved             PT Long Term Goals - 11/13/20 1130      PT LONG TERM GOAL #1   Title Pt will have improved 5xSTS to 15 sec or < to demonstrate improved overall BLE strength and power in order to maximize functional tasks at home and in the community.    Status Partially Met      PT LONG TERM GOAL #2   Title Pt will have improved lumbar flexion and extension ROM by 50% to demonstrate improved function for gardening, fishing and woodwork and decrease pain overall.    Status Partially Met      PT LONG TERM GOAL #3   Title Pt will have improved TUG to 16 sec or < with LRAD in order to demonstrate improved overall function and maximzie participation at home and in the community.    Status Partially Met      PT LONG TERM GOAL #4   Title FOTO functional outcome score improved to 52%    Status Not Met      PT LONG TERM GOAL #5   Title Trunk and LE strength grossly 4+/5 needed to rise from a chair without UE assist and greater ease negotiating steps and curbs    Status Partially Met                 Plan - 11/13/20 1122    Clinical Impression Statement The patient has made significant gains in speed with mobility as indicated by 5x sit to stand test: initially 46 sec, now 29 sec.  Timed up and go test  improved from 22 sec to 17 sec.  Her lumbar ROM has dramatically improved with extension and bilateral sidebending.  HS length measurement has more than doubled and now 70 degrees bilaterally.  General strength grossly 4/5.   Her FOTO functional outcome score did however worsen from 48% to 40%.  Given her chronic pain history and pain intensity levels treatment focus on graded exercise and activity to avoid the ups and downs of "overdoing it" and having to stay in the bed for 2 days to recover.  We discussed in depth a plan for this based on her ADLS and an appropriate amount of exercise with her HEP.  She expresses readiness for discharge from PT at this time.  Partial goals met.           Patient will benefit from skilled therapeutic intervention in order to improve the following deficits and impairments:     Visit Diagnosis: Chronic bilateral low back pain without sciatica  Muscle weakness (generalized)    PHYSICAL THERAPY DISCHARGE SUMMARY  Visits from Start of Care: 10 Current functional level related to goals / functional outcomes: See clinical impressions above   Remaining deficits: As above   Education / Equipment: HEP Plan: Patient agrees to discharge.  Patient goals were partially met. Patient is being discharged due to being pleased with the current functional level.  ?????        Problem List Patient Active Problem List   Diagnosis Date Noted  . Near syncope 05/02/2020  . Anxiety 11/06/2017  . S/P CABG x 3 06/17/2016  . Family history of early CAD 06/04/2016  . Chest pain 06/04/2016  . Abnormal nuclear stress test 06/04/2016  . Degenerative joint disease (DJD) of lumbar spine 03/24/2016  . Depression 03/24/2016  . Obesity 08/14/2015  . Essential hypertension, benign 04/29/2015  . Hyperlipidemia 04/29/2015   Ruben Im, PT 11/13/20 11:34 AM Phone: [phone] Fax: [phone]  Alvera Singh 11/13/2020, 11:32 AM  [facility] [address], [place], [place], [address] Phone: [phone]   Fax:  [phone]  Name: Donna Woodward MRN: [id number] Date of Birth: 1955-12-12

## 2020-11-13 NOTE — Patient Instructions (Signed)
Access Code: [email or web address] URL: [email or web address] Date: 11/13/2020 Prepared by: Ruben Im  Exercises Supine Lower Trunk Rotation - 2 x daily - 7 x weekly - 1 sets - 5 reps - 5 hold supine Hamstring stretch - 1 x daily - 7 x weekly - 1 sets - 5 reps Seated Hip Abduction with Resistance - 1 x daily - 7 x weekly - 3 sets - 5 reps Forearm Plank on Wall - 1 x daily - 7 x weekly - 1 sets - 5 reps - 5 hold Seated High Lat Pull Down with Overhead Anchored Resistance - 1 x daily - 7 x weekly - 1 sets - 10 reps Shoulder extension with resistance - Neutral - 1 x daily - 7 x weekly - 1 sets - 10 reps Sidelying Open Book Thoracic Lumbar Rotation and Extension - 1 x daily - 7 x weekly - 1 sets - 10 reps Push-Up on Counter - 1 x daily - 7 x weekly - 1 sets - 10 reps Seated Shoulder Row with Anchored Resistance - 1 x daily - 7 x weekly - 1 sets - 10 reps Sit to Stand - 2 x daily - 7 x weekly - 1 sets - 5 reps Forward Step Up - 1 x daily - 7 x weekly - 2 sets - 5 reps

## 2020-11-19 ENCOUNTER — Other Ambulatory Visit: Payer: Self-pay

## 2020-11-19 DIAGNOSIS — I10 Essential (primary) hypertension: Secondary | ICD-10-CM

## 2020-11-19 MED ORDER — SPIRONOLACTONE 25 MG PO TABS: 25.0000 mg | ORAL_TABLET | Freq: Every day | ORAL | 3 refills | Status: DC

## 2020-12-07 ENCOUNTER — Ambulatory Visit (INDEPENDENT_AMBULATORY_CARE_PROVIDER_SITE_OTHER): Payer: Medicare HMO | Admitting: Family Medicine

## 2020-12-07 ENCOUNTER — Encounter: Payer: Self-pay | Admitting: Family Medicine

## 2020-12-07 ENCOUNTER — Other Ambulatory Visit: Payer: Self-pay

## 2020-12-07 VITALS — BP 130/70 | HR 59 | Temp 97.7°F | Wt 171.9 lb

## 2020-12-07 DIAGNOSIS — Z23 Encounter for immunization: Secondary | ICD-10-CM | POA: Diagnosis not present

## 2020-12-07 DIAGNOSIS — R0981 Nasal congestion: Secondary | ICD-10-CM

## 2020-12-07 NOTE — Patient Instructions (Signed)
Consider OTC Flonase and Mucinex

## 2020-12-07 NOTE — Addendum Note (Signed)
Addended by: Rebecca Eaton on: 12/07/2020 09:34 AM   Modules accepted: Orders

## 2020-12-07 NOTE — Progress Notes (Signed)
Established Patient Office Visit  Subjective:  Patient ID: Donna Woodward, female    DOB: July 16, 1955  Age: 65 y.o. MRN: [id number]  CC:  Chief Complaint  Patient presents with   head pressure    Head pressure, head has a "buzzing" feeling, pt states that since starting spironolactone her bp is consistently dropping and she did not have these symptoms until after starting this medication.     HPI Donna Woodward presents for vague sense of increased head pressure.  Occasional mild vertigo-like symptoms.  Does have some sinus pressure and nasal congestion.  No fever.  No purulent secretions.  No cough.  She initially wondered if this was related to Aldactone which she has been on now for months.  Her blood pressure log by home readings is consistently good.  She is not describing any orthostatic symptoms.  She stays well-hydrated.  Patient requesting pneumonia and shingles vaccines.  Past Medical History:  Diagnosis Date   Anxiety    Back pain    Coronary artery disease    CABGx3; Seen by Dr. Johnsie Cancel   Degenerative joint disease (DJD) of lumbar spine 03/24/2016   Depression    GERD (gastroesophageal reflux disease)    if overweight   Hyperlipidemia    Hypertension     Past Surgical History:  Procedure Laterality Date   BACK SURGERY     CARDIAC CATHETERIZATION N/A 06/05/2016   Procedure: Left Heart Cath and Coronary Angiography;  Surgeon: Belva Crome, MD;  Location: [facility];  Service: Cardiovascular;  Laterality: N/A;   CORONARY ARTERY BYPASS GRAFT N/A 06/17/2016   Procedure: CORONARY ARTERY BYPASS GRAFTING (CABG), ON PUMP, TIMES THREE, USING LEFT INTERNAL MAMMARY ARTERY, RIGHT GREATER SAPHENOUS VEIN HARVESTED ENDOSCOPICALLY;  Surgeon: Ivin Poot, MD;  Location: [facility];  Service: Open Heart Surgery;  Laterality: N/A;   MULTIPLE TOOTH EXTRACTIONS     SPINE SURGERY  1990   lamnectomy L5S1   TEE WITHOUT CARDIOVERSION N/A 06/17/2016   Procedure: TRANSESOPHAGEAL  ECHOCARDIOGRAM (TEE);  Surgeon: Ivin Poot, MD;  Location: [facility];  Service: Open Heart Surgery;  Laterality: N/A;    Family History  Problem Relation Age of Onset   Hypertension Mother    Stroke Father    Heart disease Father    Dementia Father    Hypertension Sister    CAD Sister 28       cabg   Hypertension Sister    Cancer Sister 65       rectal   Hypertension Sister     Social History   Socioeconomic History   Marital status: Single    Spouse name: Not on file   Number of children: Not on file   Years of education: 18   Highest education level: Not on file  Occupational History   Occupation: disabled    Comment: warehouse  Tobacco Use   Smoking status: Former    Pack years: 0.00    Types: Cigarettes    Quit date: 06/30/2006    Years since quitting: 14.4   Smokeless tobacco: Never  Vaping Use   Vaping Use: Not on file  Substance and Sexual Activity   Alcohol use: Yes    Comment: rare   Drug use: Yes    Types: Marijuana   Sexual activity: Not Currently  Other Topics Concern   Not on file  Social History Narrative   Masters in behavioral health   Live with room mate.  Grew up in [place] and lived here for since 2014.    Disabled with back pain.    Social Determinants of Health   Financial Resource Strain: Low Risk    Difficulty of Paying Living Expenses: Not hard at all  Food Insecurity: No Food Insecurity   Worried About Charity fundraiser in the Last Year: Never true   [place] in the Last Year: Never true  Transportation Needs: No Transportation Needs   Lack of Transportation (Medical): No   Lack of Transportation (Non-Medical): No  Physical Activity: Insufficiently Active   Days of Exercise per Week: 5 days   Minutes of Exercise per Session: 10 min  Stress: Stress Concern Present   Feeling of Stress : To some extent  Social Connections: Socially Isolated   Frequency of Communication with Friends and Family: Once a week    Frequency of Social Gatherings with Friends and Family: Never   Attends Religious Services: Never   Printmaker: No   Attends Music therapist: Never   Marital Status: Never married  Human resources officer Violence: Not At Risk   Fear of Current or Ex-Partner: No   Emotionally Abused: No   Physically Abused: No   Sexually Abused: No    Outpatient Medications Prior to Visit  Medication Sig Dispense Refill   acetaminophen (TYLENOL) 325 MG tablet Take 325-650 mg by mouth every 6 (six) hours as needed for mild pain or headache.     aspirin EC 81 MG tablet Take 1 tablet (81 mg total) by mouth daily.     Calcium Carb-Cholecalciferol (CALCIUM + D3 PO) Take 1 tablet by mouth daily with breakfast.     cyclobenzaprine (FLEXERIL) 5 MG tablet Take one to two po qhs prn muscle spasm 90 tablet 1   lisinopril (ZESTRIL) 40 MG tablet Take 1 tablet (40 mg total) by mouth daily. 90 tablet 3   metoprolol succinate (TOPROL-XL) 100 MG 24 hr tablet TAKE 1 TABLET DAILY. TAKE WITH OR IMMEDIATELY FOLLOWING A MEAL. 90 tablet 3   NIFEdipine (PROCARDIA-XL/NIFEDICAL-XL) 30 MG 24 hr tablet Take 1 tablet (30 mg total) by mouth daily. 90 tablet 2   Omega-3 Fatty Acids (FISH OIL) 1000 MG CAPS Take 1,000 mg by mouth in the morning and at bedtime.      simvastatin (ZOCOR) 40 MG tablet TAKE 1 TABLET EVERY DAY 90 tablet 1   spironolactone (ALDACTONE) 25 MG tablet Take 1 tablet (25 mg total) by mouth daily. 90 tablet 3   fexofenadine (ALLEGRA ALLERGY) 180 MG tablet Take 1 tablet (180 mg total) by mouth daily. (Patient taking differently: Take 180 mg by mouth once as needed for allergies.) 30 tablet 5   No facility-administered medications prior to visit.    Allergies  Allergen Reactions   Bee Venom Swelling and Other (See Comments)    Swells where stung- no breathing issues    ROS Review of Systems  Constitutional:  Negative for fatigue.  HENT:  Positive for congestion.   Eyes:   Negative for visual disturbance.  Respiratory:  Negative for cough, chest tightness, shortness of breath and wheezing.   Cardiovascular:  Negative for chest pain, palpitations and leg swelling.  Neurological:  Positive for dizziness. Negative for seizures, syncope, weakness and headaches.     Objective:    Physical Exam Vitals reviewed.  HENT:     Right Ear: Tympanic membrane, ear canal and external ear normal.     Left  Ear: Tympanic membrane, ear canal and external ear normal.  Cardiovascular:     Rate and Rhythm: Normal rate and regular rhythm.  Pulmonary:     Effort: Pulmonary effort is normal.     Breath sounds: Normal breath sounds.  Musculoskeletal:     Cervical back: Neck supple.  Lymphadenopathy:     Cervical: No cervical adenopathy.  Neurological:     Mental Status: She is alert.    BP 130/70 (BP Location: Left Arm, Patient Position: Sitting, Cuff Size: Normal)   Pulse (!) 59   Temp 97.7 F (36.5 C) (Oral)   Wt 171 lb 14.4 oz (78 kg)   SpO2 96%   BMI 29.51 kg/m  Wt Readings from Last 3 Encounters:  12/07/20 171 lb 14.4 oz (78 kg)  10/29/20 173 lb (78.5 kg)  10/18/20 173 lb (78.5 kg)     Health Maintenance Due  Topic Date Due   Pneumococcal Vaccine 10-27 Years old (1 - PCV) Never done   Zoster Vaccines- Shingrix (1 of 2) Never done   PAP SMEAR-Modifier  09/21/2014   COVID-19 Vaccine (3 - Pfizer risk series) 01/02/2020   COLONOSCOPY (Pts 45-42yrs Insurance coverage will need to be confirmed)  06/30/2020    There are no preventive care reminders to display for this patient.  Lab Results  Component Value Date   TSH 1.65 10/20/2019   Lab Results  Component Value Date   WBC 8.2 05/03/2020   HGB 13.8 05/03/2020   HCT 42.1 05/03/2020   MCV 87.5 05/03/2020   PLT 195 05/03/2020   Lab Results  Component Value Date   NA 139 08/20/2020   K 4.0 08/20/2020   CO2 24 08/20/2020   GLUCOSE 77 08/20/2020   BUN 14 08/20/2020   CREATININE 0.71 08/20/2020    BILITOT 0.6 05/03/2020   ALKPHOS 51 05/03/2020   AST 15 05/03/2020   ALT 18 05/03/2020   PROT 6.3 (L) 05/03/2020   ALBUMIN 3.6 05/03/2020   CALCIUM 9.3 08/20/2020   ANIONGAP 9 05/03/2020   GFR 80.32 10/20/2019   Lab Results  Component Value Date   CHOL 137 10/20/2019   Lab Results  Component Value Date   HDL 37.20 (L) 10/20/2019   Lab Results  Component Value Date   LDLCALC 84 10/20/2019   Lab Results  Component Value Date   TRIG 77.0 10/20/2019   Lab Results  Component Value Date   CHOLHDL 4 10/20/2019   Lab Results  Component Value Date   HGBA1C 5.8 10/20/2019      Assessment & Plan:   Patient relates vague symptoms of head congestion and pressure-like sensation.  She is not describing any acute headaches.  Blood pressure appears to be stable.  Does have significant nasal congestion intermittently and wonder if some of this is allergy related.  -Suggest over-the-counter Flonase and possibly Mucinex. -Follow-up for any persistent or worsening symptoms  -Patient requesting Shingrix and pneumonia vaccines.  No contraindications.  She is reminded to get second shingles vaccine in 2 to 6 months  No orders of the defined types were placed in this encounter.   Follow-up: No follow-ups on file.    Carolann Littler, MD

## 2020-12-13 ENCOUNTER — Encounter: Payer: Self-pay | Admitting: Family Medicine

## 2020-12-17 ENCOUNTER — Other Ambulatory Visit: Payer: Self-pay | Admitting: Family Medicine

## 2021-01-03 ENCOUNTER — Encounter: Payer: Self-pay | Admitting: Family Medicine

## 2021-01-31 DIAGNOSIS — H5213 Myopia, bilateral: Secondary | ICD-10-CM | POA: Diagnosis not present

## 2021-01-31 DIAGNOSIS — H52209 Unspecified astigmatism, unspecified eye: Secondary | ICD-10-CM | POA: Diagnosis not present

## 2021-02-05 ENCOUNTER — Other Ambulatory Visit: Payer: Self-pay

## 2021-02-06 ENCOUNTER — Ambulatory Visit: Payer: Medicare HMO

## 2021-02-07 ENCOUNTER — Ambulatory Visit: Payer: Medicare HMO

## 2021-02-10 ENCOUNTER — Encounter: Payer: Self-pay | Admitting: Family Medicine

## 2021-02-28 ENCOUNTER — Telehealth: Payer: Self-pay | Admitting: Family Medicine

## 2021-02-28 DIAGNOSIS — I10 Essential (primary) hypertension: Secondary | ICD-10-CM

## 2021-02-28 MED ORDER — LISINOPRIL 40 MG PO TABS: 40.0000 mg | ORAL_TABLET | Freq: Every day | ORAL | 3 refills | Status: DC

## 2021-02-28 NOTE — Telephone Encounter (Signed)
Pt called back, stated refills to be sent to [facility] (mail order). Refills sent to requested pharmacy.

## 2021-02-28 NOTE — Telephone Encounter (Signed)
Called pt, no answer, left vm to let us know which pharmacy to send refills to.

## 2021-02-28 NOTE — Telephone Encounter (Signed)
Pt call and stated her lisinopril was denied and she want to know why.pt stated she want a call back.

## 2021-04-03 ENCOUNTER — Other Ambulatory Visit: Payer: Self-pay | Admitting: Family Medicine

## 2021-04-03 DIAGNOSIS — I10 Essential (primary) hypertension: Secondary | ICD-10-CM

## 2021-04-12 ENCOUNTER — Encounter: Payer: Self-pay | Admitting: Family Medicine

## 2021-04-15 DIAGNOSIS — D485 Neoplasm of uncertain behavior of skin: Secondary | ICD-10-CM | POA: Diagnosis not present

## 2021-04-24 ENCOUNTER — Other Ambulatory Visit: Payer: Self-pay | Admitting: Family Medicine

## 2021-06-02 ENCOUNTER — Other Ambulatory Visit: Payer: Self-pay | Admitting: Family Medicine

## 2021-06-11 ENCOUNTER — Ambulatory Visit (INDEPENDENT_AMBULATORY_CARE_PROVIDER_SITE_OTHER): Payer: Medicare HMO

## 2021-06-11 DIAGNOSIS — Z23 Encounter for immunization: Secondary | ICD-10-CM

## 2021-06-23 ENCOUNTER — Other Ambulatory Visit: Payer: Self-pay | Admitting: Cardiovascular Disease

## 2021-10-02 ENCOUNTER — Ambulatory Visit: Payer: Medicare HMO | Admitting: Family Medicine

## 2021-10-03 ENCOUNTER — Ambulatory Visit (INDEPENDENT_AMBULATORY_CARE_PROVIDER_SITE_OTHER): Payer: Medicare HMO | Admitting: Family Medicine

## 2021-10-03 ENCOUNTER — Encounter: Payer: Self-pay | Admitting: Family Medicine

## 2021-10-03 VITALS — BP 144/84 | HR 65 | Temp 98.1°F | Ht 63.0 in | Wt 173.0 lb

## 2021-10-03 DIAGNOSIS — E782 Mixed hyperlipidemia: Secondary | ICD-10-CM

## 2021-10-03 DIAGNOSIS — Z0000 Encounter for general adult medical examination without abnormal findings: Secondary | ICD-10-CM | POA: Diagnosis not present

## 2021-10-03 DIAGNOSIS — F331 Major depressive disorder, recurrent, moderate: Secondary | ICD-10-CM | POA: Diagnosis not present

## 2021-10-03 DIAGNOSIS — I10 Essential (primary) hypertension: Secondary | ICD-10-CM | POA: Diagnosis not present

## 2021-10-03 DIAGNOSIS — Z1211 Encounter for screening for malignant neoplasm of colon: Secondary | ICD-10-CM | POA: Diagnosis not present

## 2021-10-03 LAB — COMPREHENSIVE METABOLIC PANEL
ALT: 15 U/L (ref 0–35)
AST: 15 U/L (ref 0–37)
Albumin: 4.7 g/dL (ref 3.5–5.2)
Alkaline Phosphatase: 49 U/L (ref 39–117)
BUN: 24 mg/dL — ABNORMAL HIGH (ref 6–23)
CO2: 30 mEq/L (ref 19–32)
Calcium: 9.9 mg/dL (ref 8.4–10.5)
Chloride: 103 mEq/L (ref 96–112)
Creatinine, Ser: 0.94 mg/dL (ref 0.40–1.20)
GFR: 63.58 mL/min (ref >60.00)
Glucose, Bld: 118 mg/dL — ABNORMAL HIGH (ref 70–99)
Potassium: 4.6 mEq/L (ref 3.5–5.1)
Sodium: 140 mEq/L (ref 135–145)
Total Bilirubin: 0.6 mg/dL (ref 0.2–1.2)
Total Protein: 7.3 g/dL (ref 6.0–8.3)

## 2021-10-03 LAB — HEMOGLOBIN A1C: Hgb A1c MFr Bld: 6.2 % (ref 4.6–6.5)

## 2021-10-03 LAB — CBC WITH DIFFERENTIAL/PLATELET
Basophils Absolute: 0.1 10*3/uL (ref 0.0–0.1)
Basophils Relative: 0.6 % (ref 0.0–3.0)
Eosinophils Absolute: 0.2 10*3/uL (ref 0.0–0.7)
Eosinophils Relative: 2.3 % (ref 0.0–5.0)
HCT: 44.5 % (ref 36.0–46.0)
Hemoglobin: 14.8 g/dL (ref 12.0–15.0)
Lymphocytes Relative: 20.7 % (ref 12.0–46.0)
Lymphs Abs: 2.1 10*3/uL (ref 0.7–4.0)
MCHC: 33.3 g/dL (ref 30.0–36.0)
MCV: 87.5 fl (ref 78.0–100.0)
Monocytes Absolute: 0.6 10*3/uL (ref 0.1–1.0)
Monocytes Relative: 5.9 % (ref 3.0–12.0)
Neutro Abs: 7.2 10*3/uL (ref 1.4–7.7)
Neutrophils Relative %: 70.5 % (ref 43.0–77.0)
Platelets: 234 10*3/uL (ref 150.0–400.0)
RBC: 5.08 Mil/uL (ref 3.87–5.11)
RDW: 14.8 % (ref 11.5–15.5)
WBC: 10.1 10*3/uL (ref 4.0–10.5)

## 2021-10-03 LAB — TSH: TSH: 2.15 u[IU]/mL (ref 0.35–5.50)

## 2021-10-03 LAB — LIPID PANEL
Cholesterol: 165 mg/dL (ref 0–200)
HDL: 32.5 mg/dL — ABNORMAL LOW (ref >39.00)
LDL Cholesterol: 102 mg/dL — ABNORMAL HIGH (ref 0–99)
NonHDL: 132.44
Total CHOL/HDL Ratio: 5
Triglycerides: 154 mg/dL — ABNORMAL HIGH (ref 0.0–149.0)
VLDL: 30.8 mg/dL (ref 0.0–40.0)

## 2021-10-03 LAB — T4, FREE: Free T4: 0.85 ng/dL (ref 0.60–1.60)

## 2021-10-03 NOTE — Progress Notes (Addendum)
Subjective:  ?  ? Donna Woodward is a 66 y.o. female and is here for a comprehensive physical exam. The patient reports doing well for the most part.   Checking BP twice a day at home.  Readings typically 80-135/60-85 per printout of readings pt provides.  Patient denies headaches, CP, changes in vision, LE edema.  Trying to walk more around her neighborhood but endorses chronic low back pain becomes worse on walk back to the house.  Patient endorses feeling down the last few days 2/2 the death of a friend.  Patient plans to schedule mammogram.  Did Cologuard a while ago but was told to follow-up as blood was noted in sample.  Patient endorses having diarrhea at time of getting sample for Cologuard. ? ?Past Medical History:  ?Diagnosis Date  ? Anxiety   ? Back pain   ? Coronary artery disease   ? CABGx3; Seen by Dr. Johnsie Cancel  ? Degenerative joint disease (DJD) of lumbar spine 03/24/2016  ? Depression   ? GERD (gastroesophageal reflux disease)   ? if overweight  ? Hyperlipidemia   ? Hypertension   ? ? ?Social History  ? ?Socioeconomic History  ? Marital status: Single  ?  Spouse name: Not on file  ? Number of children: Not on file  ? Years of education: 23  ? Highest education level: Not on file  ?Occupational History  ? Occupation: disabled  ?  Comment: warehouse  ?Tobacco Use  ? Smoking status: Former  ?  Types: Cigarettes  ?  Quit date: 06/30/2006  ?  Years since quitting: 15.2  ? Smokeless tobacco: Never  ?Vaping Use  ? Vaping Use: Not on file  ?Substance and Sexual Activity  ? Alcohol use: Yes  ?  Comment: rare  ? Drug use: Yes  ?  Types: Marijuana  ? Sexual activity: Not Currently  ?Other Topics Concern  ? Not on file  ?Social History Narrative  ? Masters in behavioral health  ? Live with room mate.  ? Grew up in [place] and lived here for since 2014.   ? Disabled with back pain.   ? ?Social Determinants of Health  ? ?Financial Resource Strain: Low Risk   ? Difficulty of Paying Living Expenses: Not hard at all   ?Food Insecurity: No Food Insecurity  ? Worried About Charity fundraiser in the Last Year: Never true  ? Ran Out of Food in the Last Year: Never true  ?Transportation Needs: No Transportation Needs  ? Lack of Transportation (Medical): No  ? Lack of Transportation (Non-Medical): No  ?Physical Activity: Insufficiently Active  ? Days of Exercise per Week: 5 days  ? Minutes of Exercise per Session: 10 min  ?Stress: Stress Concern Present  ? Feeling of Stress : To some extent  ?Social Connections: Socially Isolated  ? Frequency of Communication with Friends and Family: Once a week  ? Frequency of Social Gatherings with Friends and Family: Never  ? Attends Religious Services: Never  ? Active Member of Clubs or Organizations: No  ? Attends Archivist Meetings: Never  ? Marital Status: Never married  ?Intimate Partner Violence: Not At Risk  ? Fear of Current or Ex-Partner: No  ? Emotionally Abused: No  ? Physically Abused: No  ? Sexually Abused: No  ? ?Health Maintenance  ?Topic Date Due  ? PAP SMEAR-Modifier  09/21/2014  ? COLONOSCOPY (Pts 45-67yr Insurance coverage will need to be confirmed)  06/30/2020  ? COVID-19 Vaccine (  4 - Booster for [employer] series) 02/01/2021  ? Zoster Vaccines- Shingrix (2 of 2) 02/01/2021  ? INFLUENZA VACCINE  01/28/2022  ? MAMMOGRAM  06/26/2022  ? TETANUS/TDAP  11/07/2027  ? Pneumonia Vaccine 48+ Years old  Completed  ? DEXA SCAN  Completed  ? Hepatitis C Screening  Completed  ? HIV Screening  Completed  ? HPV VACCINES  Aged Out  ? ? ?The following portions of the patient's history were reviewed and updated as appropriate: allergies, current medications, past family history, past medical history, past social history, past surgical history, and problem list. ? ?Review of Systems ?Pertinent items noted in HPI and remainder of comprehensive ROS otherwise negative.  ? ?Objective:  ? ? BP (!) 144/84 (BP Location: Left Arm, Patient Position: Sitting, Cuff Size: Normal)   Pulse 65   Temp  98.1 ?F (36.7 ?C) (Oral)   Ht '5\' 3"'$  (1.6 m)   Wt 173 lb (78.5 kg)   SpO2 98%   BMI 30.65 kg/m?  ?General appearance: alert, cooperative, and no distress ?Head: Normocephalic, without obvious abnormality, atraumatic ?Eyes: conjunctivae/corneas clear. PERRL, EOM's intact. Fundi benign. ?Ears: normal TM's and external ear canals both ears ?Nose: Nares normal. Septum midline. Mucosa normal. No drainage or sinus tenderness. ?Throat: lips, mucosa, and tongue normal; teeth and gums normal ?Neck: no adenopathy, no carotid bruit, no JVD, supple, symmetrical, trachea midline, and thyroid not enlarged, symmetric, no tenderness/mass/nodules ?Lungs: clear to auscultation bilaterally ?Heart: regular rate and rhythm, S1, S2 normal, no murmur, click, rub or gallop ?Abdomen: soft, non-tender; bowel sounds normal; no masses,  no organomegaly ?Extremities: extremities normal, atraumatic, no cyanosis or edema ?Pulses: 2+ and symmetric ?Skin: Skin color, texture, turgor normal. No rashes or lesions ?Lymph nodes: Cervical, supraclavicular, and axillary nodes normal. ?Neurologic: Alert and oriented X 3, normal strength and tone. Normal symmetric reflexes. Normal coordination and gait  ? ? ?  10/03/2021  ?  8:47 AM 10/24/2020  ?  8:57 AM 10/20/2019  ?  8:56 AM  ?Depression screen PHQ 2/9  ?Decreased Interest 2 0 1  ?Down, Depressed, Hopeless '2 1 1  '$ ?PHQ - 2 Score '4 1 2  '$ ?Altered sleeping 1  1  ?Tired, decreased energy 1  1  ?Change in appetite 1  0  ?Feeling bad or failure about yourself  2  2  ?Trouble concentrating 2  2  ?Moving slowly or fidgety/restless 1  0  ?Suicidal thoughts 0  0  ?PHQ-9 Score 12  8  ?Difficult doing work/chores Somewhat difficult  Somewhat difficult  ? ? ?  ?Assessment:  ? ? Healthy female exam with HTN and chronic back pain 2/2 DDD    ?  ?Plan:  ? ? Anticipatory guidance given including wearing seatbelts, smoke detectors in the home, increasing physical activity, increasing p.o. intake of water and vegetables.   Patient encouraged to consider water aerobics for history of chronic back pain 2/2 DDD. ?-labs ?-We will place referral for colonoscopy ?-Patient encouraged to schedule mammogram ?-Immunizations reviewed.  Patient to obtain Shingrix second dose at pharmacy ?-Given handout ?-Next EPM 1 year ?See After Visit Summary for Counseling Recommendations  ? ?Essential hypertension ?-Slightly elevated in clinic ?-Well-controlled at home ?-Continue current medications including spironolactone 25 mg, Procardia XL 30 mg, Toprol-XL 100 mg, lisinopril 40 mg daily. ?-For continued well-controlled readings at home consider decreasing lisinopril to 30 mg daily to decrease number of diastolic readings in the 02V. ?- Plan: CBC with Differential/Platelet, TSH, T4, Free, CMP ? ?Mixed  hyperlipidemia ?-Lipid panel 10/20/2019 with total cholesterol 137, LDL 84, triglycerides 77, HDL 37.2 ?-Continue lifestyle modifications ?-Continue Zocor 40 mg daily ? - Plan: Lipid panel ? ?Colon cancer screening  ?-Abnormal Cologuard results per patient.  Unable to find record Per chart review ?- Plan: Ambulatory referral to Gastroenterology ? ?Moderate episode of recurrent major depressive disorder ([place]) ?-PHQ-9 score 12 this visit ?-Patient declines medication and counseling. ?-Self-care encouraged. ?-Patient encouraged to get outside daily and consider going to the park or other activities to get out of the house. ?-We will continue to monitor ? ?F/u prn in 4-6 months, sooner if needed ? ?Grier Mitts, MD ?

## 2021-10-04 ENCOUNTER — Encounter: Payer: Self-pay | Admitting: Family Medicine

## 2021-10-10 ENCOUNTER — Other Ambulatory Visit: Payer: Self-pay | Admitting: Family Medicine

## 2021-10-17 ENCOUNTER — Other Ambulatory Visit: Payer: Self-pay | Admitting: Cardiovascular Disease

## 2021-10-17 DIAGNOSIS — I10 Essential (primary) hypertension: Secondary | ICD-10-CM

## 2021-10-21 NOTE — Progress Notes (Signed)
? ? ? ?CARDIOLOGY OFFICE NOTE ? ?Date:  10/31/2021  ? ? ?Donna Woodward ?Date of Birth: 08-May-1956 ?Medical Record #[id number] ? ?PCP:  Billie Ruddy, MD  ?Cardiologist:  Johnsie Cancel  ? ?No chief complaint on file. ? ? ?History of Present Illness: ?Donna Woodward is a 66 y.o. female  has a history of HTN, HLD, previous smoker and known CAD with prior CABG x 3 in 2017 with LIMA to LAD; SVG to PD and SVG to DX. Did have post op ventricular ectopy - required short term use of amiodarone.   ? ?Presented to the ER 05/02/20  with palpitations/pre syncope along with chest pressure and headache.. CT of the head negative. Troponin negative. Myovue called ischemia but to my read breast attenuation low risk K was low and diuretic d/c with increase in ACE BP up on visit with primary 06/08/20  ? ?Activity limited by chronic back pain  ? ?January aldactone added for BP and low K resolved  ? ?No chest pain compliant with meds Home BP readings have been normal  ? ?Discussed changing zocor to lipitor to get LDL at goal ?Changed her BP med schedule to spread them out more  ? ?Past Medical History:  ?Diagnosis Date  ? Anxiety   ? Back pain   ? Coronary artery disease   ? CABGx3; Seen by Dr. Johnsie Cancel  ? Degenerative joint disease (DJD) of lumbar spine 03/24/2016  ? Depression   ? GERD (gastroesophageal reflux disease)   ? if overweight  ? Hyperlipidemia   ? Hypertension   ? ? ?Past Surgical History:  ?Procedure Laterality Date  ? BACK SURGERY    ? CARDIAC CATHETERIZATION N/A 06/05/2016  ? Procedure: Left Heart Cath and Coronary Angiography;  Surgeon: Belva Crome, MD;  Location: [facility];  Service: Cardiovascular;  Laterality: N/A;  ? CORONARY ARTERY BYPASS GRAFT N/A 06/17/2016  ? Procedure: CORONARY ARTERY BYPASS GRAFTING (CABG), ON PUMP, TIMES THREE, USING LEFT INTERNAL MAMMARY ARTERY, RIGHT GREATER SAPHENOUS VEIN HARVESTED ENDOSCOPICALLY;  Surgeon: Ivin Poot, MD;  Location: [facility];  Service: Open Heart Surgery;   Laterality: N/A;  ? MULTIPLE TOOTH EXTRACTIONS    ? [place]  ? lamnectomy L5S1  ? TEE WITHOUT CARDIOVERSION N/A 06/17/2016  ? Procedure: TRANSESOPHAGEAL ECHOCARDIOGRAM (TEE);  Surgeon: Ivin Poot, MD;  Location: [facility];  Service: Open Heart Surgery;  Laterality: N/A;  ? ? ? ?Medications: ?Current Meds  ?Medication Sig  ? acetaminophen (TYLENOL) 325 MG tablet Take 325-650 mg by mouth every 6 (six) hours as needed for mild pain or headache.  ? aspirin EC 81 MG tablet Take 1 tablet (81 mg total) by mouth daily.  ? Calcium Carb-Cholecalciferol (CALCIUM + D3 PO) Take 1 tablet by mouth daily with breakfast.  ? cyclobenzaprine (FLEXERIL) 5 MG tablet TAKE 1 TO 2 TABLETS AT BEDTIME AS NEEDED FOR  MUSCLE  SPASM  ? lisinopril (ZESTRIL) 40 MG tablet Take 1 tablet (40 mg total) by mouth daily.  ? metoprolol succinate (TOPROL-XL) 100 MG 24 hr tablet TAKE 1 TABLET DAILY. TAKE WITH OR IMMEDIATELY FOLLOWING A MEAL.  ? NIFEdipine (PROCARDIA-XL/NIFEDICAL-XL) 30 MG 24 hr tablet TAKE 1 TABLET EVERY DAY  ? Omega-3 Fatty Acids (FISH OIL) 1000 MG CAPS Take 1,000 mg by mouth in the morning and at bedtime.   ? simvastatin (ZOCOR) 40 MG tablet TAKE 1 TABLET EVERY DAY  ? spironolactone (ALDACTONE) 25 MG tablet TAKE 1 TABLET EVERY DAY  ? ? ? ?  Allergies: ?Allergies  ?Allergen Reactions  ? Bee Venom Swelling and Other (See Comments)  ?  [place] where stung- no breathing issues  ? ? ?Social History: ?The patient  reports that she quit smoking about 15 years ago. Her smoking use included cigarettes. She has never used smokeless tobacco. She reports current alcohol use. She reports current drug use. Drug: Marijuana. ?  ?Family History: ?The patient's family history includes CAD (age of onset: 19) in her sister; Cancer (age of onset: 78) in her sister; Dementia in her father; Heart disease in her father; Hypertension in her mother, sister, sister, and sister; Stroke in her father.  ? ?Review of Systems: ?Please see the history of  present illness.   All other systems are reviewed and negative.  ? ?Physical Exam: ?VS:  BP (!) 152/72   Pulse 69   Ht '5\' 3"'$  (1.6 m)   Wt 174 lb 6.4 oz (79.1 kg)   SpO2 96%   BMI 30.89 kg/m?  Marland Kitchen  BMI Body mass index is 30.89 kg/m?. ? ?Wt Readings from Last 3 Encounters:  ?10/31/21 174 lb 6.4 oz (79.1 kg)  ?10/03/21 173 lb (78.5 kg)  ?12/07/20 171 lb 14.4 oz (78 kg)  ? ? ?Affect appropriate ?Healthy:  appears stated age ?HEENT: normal ?Neck supple with no adenopathy ?JVP normal no bruits no thyromegaly ?Lungs clear with no wheezing and good diaphragmatic motion ?Heart:  S1/S2 no murmur, no rub, gallop or click ?PMI normal post sternotomy  ?Abdomen: benighn, BS positve, no tenderness, no AAA ?no bruit.  No HSM or HJR ?Distal pulses intact with no bruits ?No edema ?Neuro non-focal ?Skin warm and dry ?No muscular weakness ? ? ? ?LABORATORY DATA: ? ?EKG:    SR PAC lateral ST depression 05/03/20 10/31/2021 SR rate 69 normal  ? ?Lab Results  ?Component Value Date  ? WBC 10.1 10/03/2021  ? HGB 14.8 10/03/2021  ? HCT 44.5 10/03/2021  ? PLT 234.0 10/03/2021  ? GLUCOSE 118 (H) 10/03/2021  ? CHOL 165 10/03/2021  ? TRIG 154.0 (H) 10/03/2021  ? HDL 32.50 (L) 10/03/2021  ? LDLCALC 102 (H) 10/03/2021  ? ALT 15 10/03/2021  ? AST 15 10/03/2021  ? NA 140 10/03/2021  ? K 4.6 10/03/2021  ? CL 103 10/03/2021  ? CREATININE 0.94 10/03/2021  ? BUN 24 (H) 10/03/2021  ? CO2 30 10/03/2021  ? TSH 2.15 10/03/2021  ? INR 1.23 06/17/2016  ? HGBA1C 6.2 10/03/2021  ? ? ? ?BNP (last 3 results) ?No results for input(s): BNP in the last 8760 hours. ? ?ProBNP (last 3 results) ?No results for input(s): PROBNP in the last 8760 hours. ? ? ?Other Studies Reviewed Today: ? ?MYOVIEW 04/2020 ?Narrative & Impression  ?  ?Findings consistent with prior myocardial infarction with peri-infarct ischemia. ?The left ventricular ejection fraction is normal (55-65%). ?Nuclear stress EF: 58%. ?Horizontal ST segment depression ST segment depression was noted during  stress. 1-2 mm ?This is a low-intermediate risk study. ?  ?Small partially reversible perfusion defect in the anteroapex, moderate perfusion defect with stress that returns to mild with rest.  At the mid ventricle there is a mild perfusion defect with stress that returns to normal at rest.  Findings in combination suggest prior infarct with peri-infarct ischemia.  EKG changes were noted with stress, suggestive of ischemia.  Grossly normal wall motion. ?The study is low to intermediate risk due to small area of ischemia and ECG changes. ?   ? ?Myoview reviewed with Dr. Johnsie Cancel  on 10/31/21 - he feels her study is stable with breast attenuation noted - reassuring study.  ? ?TEE 06/17/16 ?Result status: Final result  ?Left ventricle: Normal cavity size and wall thickness. LV systolic function is low normal with an EF of 50-55%. No thrombus present. No mass present. ?Septum: No Patent Foramen Ovale present. The interatrial septum bows to the left consistent with high right atrial pressure. ?Left atrium: Patent foramen ovale not present. ?Aortic valve: The valve is trileaflet. Mild valve calcification present. No stenosis. No stenosis by continuous wave doppler Trace regurgitation. ?Mitral valve: Mild leaflet thickening is present. Trace regurgitation. ?Right ventricle: Normal cavity size and ejection fraction. There is mild hypertrophy. ?Tricuspid valve: Mild regurgitation. The tricuspid valve regurgitation jet is central.  ?  ?  ?  ?Cardiac Catheterization:  06/05/16 ?Left Heart Cath and Coronary Angiography  ?Conclusion  ?  ?Severe calcific, diffuse three-vessel coronary disease. ?Codominant LAD/diagonal system with severe diffuse disease in each vessel with graftable distal targets. ?Eccentric 30-60% ostial left main depending upon review. ?99% proximal to mid RCA. Total occlusion at the ostium of a large PDA that fills left-to-right collaterals. ?Moderate mid circumflex disease but without graftable targets beyond the  stenosis. ?Normal left ventricular systolic function with normal hemodynamics ?  ?  ?RECOMMENDATIONS: ?  ?Continue current medical regimen. ?Expedient evaluation by [facility] for multivessel coronary bypass grafting.  ?  ?  ?  ?

## 2021-10-31 ENCOUNTER — Encounter: Payer: Self-pay | Admitting: Cardiovascular Disease

## 2021-10-31 ENCOUNTER — Ambulatory Visit: Payer: Medicare HMO | Admitting: Cardiovascular Disease

## 2021-10-31 VITALS — BP 152/72 | HR 69 | Ht 63.0 in | Wt 174.4 lb

## 2021-10-31 DIAGNOSIS — I10 Essential (primary) hypertension: Secondary | ICD-10-CM

## 2021-10-31 DIAGNOSIS — E785 Hyperlipidemia, unspecified: Secondary | ICD-10-CM | POA: Diagnosis not present

## 2021-10-31 DIAGNOSIS — I25810 Atherosclerosis of coronary artery bypass graft(s) without angina pectoris: Secondary | ICD-10-CM

## 2021-10-31 MED ORDER — ATORVASTATIN CALCIUM 40 MG PO TABS: 40.0000 mg | ORAL_TABLET | Freq: Every day | ORAL | 3 refills | Status: DC

## 2021-10-31 NOTE — Patient Instructions (Signed)
Medication Instructions:  ?Your physician has recommended you make the following change in your medication:  ?1-STOP Zocor ?2-START Lipitor 40 mg by mouth daily ? ?*If you need a refill on your cardiac medications before your next appointment, please call your pharmacy* ? ?Lab Work: ?Your physician recommends that you return for lab work in: 3 months for fasting lipid and liver panel. ? ?If you have labs (blood work) drawn today and your tests are completely normal, you will receive your results only by: ?MyChart Message (if you have MyChart) OR ?A paper copy in the mail ?If you have any lab test that is abnormal or we need to change your treatment, we will call you to review the results. ? ?Testing/Procedures: ?None ordered today ? ?Follow-Up: ?At [facility] you and your health needs are our priority.  As part of our continuing mission to provide you with exceptional heart care, we have created designated Provider Care Teams.  These Care Teams include your primary Cardiologist (physician) and Advanced Practice Providers (APPs -  Physician Assistants and Nurse Practitioners) who all work together to provide you with the care you need, when you need it. ? ?We recommend signing up for the patient portal called "MyChart".  Sign up information is provided on this After Visit Summary.  MyChart is used to connect with patients for Virtual Visits (Telemedicine).  Patients are able to view lab/test results, encounter notes, upcoming appointments, etc.  Non-urgent messages can be sent to your provider as well.   ?To learn more about what you can do with MyChart, go to [email or web address].   ? ?Your next appointment:   ?1 year(s) ? ?The format for your next appointment:   ?In Person ? ?Provider:   ?Jenkins Rouge, MD { ? ? ?Important Information About Sugar ? ? ? ? ?  ?

## 2021-11-05 ENCOUNTER — Ambulatory Visit (INDEPENDENT_AMBULATORY_CARE_PROVIDER_SITE_OTHER): Payer: Medicare HMO

## 2021-11-05 VITALS — Ht 63.0 in | Wt 174.0 lb

## 2021-11-05 DIAGNOSIS — Z0000 Encounter for general adult medical examination without abnormal findings: Secondary | ICD-10-CM | POA: Diagnosis not present

## 2021-11-05 NOTE — Patient Instructions (Signed)
Donna Woodward , ?Thank you for taking time to come for your Medicare Wellness Visit. I appreciate your ongoing commitment to your health goals. Please review the following plan we discussed and let me know if I can assist you in the future.  ? ?Screening recommendations/referrals: ?Colonoscopy: due ?Mammogram: patient to schedule ?Bone Density: completed 05/07/2017 ?Recommended yearly ophthalmology/optometry visit for glaucoma screening and checkup ?Recommended yearly dental visit for hygiene and checkup ? ?Vaccinations: ?Influenza vaccine: due 01/28/2022 ?Pneumococcal vaccine: completed 12/07/2020 ?Tdap vaccine: completed 11/06/2017, due 11/07/2027 ?Shingles vaccine: discussed   ?Covid-19: 12/07/2020, 12/05/2019, 11/12/2019 ? ?Advanced directives: Advance directive discussed with you today. ? ?Conditions/risks identified: none ? ?Next appointment: Follow up in one year for your annual wellness visit  ? ? ?Preventive Care 2 Years and Older, Female ?Preventive care refers to lifestyle choices and visits with your health care provider that can promote health and wellness. ?What does preventive care include? ?A yearly physical exam. This is also called an annual well check. ?Dental exams once or twice a year. ?Routine eye exams. Ask your health care provider how often you should have your eyes checked. ?Personal lifestyle choices, including: ?Daily care of your teeth and gums. ?Regular physical activity. ?Eating a healthy diet. ?Avoiding tobacco and drug use. ?Limiting alcohol use. ?Practicing safe sex. ?Taking low-dose aspirin every day. ?Taking vitamin and mineral supplements as recommended by your health care provider. ?What happens during an annual well check? ?The services and screenings done by your health care provider during your annual well check will depend on your age, overall health, lifestyle risk factors, and family history of disease. ?Counseling  ?Your health care provider may ask you questions about  your: ?Alcohol use. ?Tobacco use. ?Drug use. ?Emotional well-being. ?Home and relationship well-being. ?Sexual activity. ?Eating habits. ?History of falls. ?Memory and ability to understand (cognition). ?Work and work Statistician. ?Reproductive health. ?Screening  ?You may have the following tests or measurements: ?Height, weight, and BMI. ?Blood pressure. ?Lipid and cholesterol levels. These may be checked every 5 years, or more frequently if you are over 26 years old. ?Skin check. ?Lung cancer screening. You may have this screening every year starting at age 66 if you have a 30-pack-year history of smoking and currently smoke or have quit within the past 15 years. ?Fecal occult blood test (FOBT) of the stool. You may have this test every year starting at age 84. ?Flexible sigmoidoscopy or colonoscopy. You may have a sigmoidoscopy every 5 years or a colonoscopy every 10 years starting at age 59. ?Hepatitis C blood test. ?Hepatitis B blood test. ?Sexually transmitted disease (STD) testing. ?Diabetes screening. This is done by checking your blood sugar (glucose) after you have not eaten for a while (fasting). You may have this done every 1-3 years. ?Bone density scan. This is done to screen for osteoporosis. You may have this done starting at age 36. ?Mammogram. This may be done every 1-2 years. Talk to your health care provider about how often you should have regular mammograms. ?Talk with your health care provider about your test results, treatment options, and if necessary, the need for more tests. ?Vaccines  ?Your health care provider may recommend certain vaccines, such as: ?Influenza vaccine. This is recommended every year. ?Tetanus, diphtheria, and acellular pertussis (Tdap, Td) vaccine. You may need a Td booster every 10 years. ?Zoster vaccine. You may need this after age 33. ?Pneumococcal 13-valent conjugate (PCV13) vaccine. One dose is recommended after age 61. ?Pneumococcal polysaccharide (PPSV23) vaccine.  One dose  is recommended after age 64. ?Talk to your health care provider about which screenings and vaccines you need and how often you need them. ?This information is not intended to replace advice given to you by your health care provider. Make sure you discuss any questions you have with your health care provider. ?Document Released: 07/13/2015 Document Revised: 03/05/2016 Document Reviewed: 04/17/2015 ?Elsevier Interactive Patient Education ? 2017 [employer]. ? ?Fall Prevention in the Home ?Falls can cause injuries. They can happen to people of all ages. There are many things you can do to make your home safe and to help prevent falls. ?What can I do on the outside of my home? ?Regularly fix the edges of walkways and driveways and fix any cracks. ?Remove anything that might make you trip as you walk through a door, such as a raised step or threshold. ?Trim any bushes or trees on the path to your home. ?Use bright outdoor lighting. ?Clear any walking paths of anything that might make someone trip, such as rocks or tools. ?Regularly check to see if handrails are loose or broken. Make sure that both sides of any steps have handrails. ?Any raised decks and porches should have guardrails on the edges. ?Have any leaves, snow, or ice cleared regularly. ?Use sand or salt on walking paths during winter. ?Clean up any spills in your garage right away. This includes oil or grease spills. ?What can I do in the bathroom? ?Use night lights. ?Install grab bars by the toilet and in the tub and shower. Do not use towel bars as grab bars. ?Use non-skid mats or decals in the tub or shower. ?If you need to sit down in the shower, use a plastic, non-slip stool. ?Keep the floor dry. Clean up any water that spills on the floor as soon as it happens. ?Remove soap buildup in the tub or shower regularly. ?Attach bath mats securely with double-sided non-slip rug tape. ?Do not have throw rugs and other things on the floor that can make  you trip. ?What can I do in the bedroom? ?Use night lights. ?Make sure that you have a light by your bed that is easy to reach. ?Do not use any sheets or blankets that are too big for your bed. They should not hang down onto the floor. ?Have a firm chair that has side arms. You can use this for support while you get dressed. ?Do not have throw rugs and other things on the floor that can make you trip. ?What can I do in the kitchen? ?Clean up any spills right away. ?Avoid walking on wet floors. ?Keep items that you use a lot in easy-to-reach places. ?If you need to reach something above you, use a strong step stool that has a grab bar. ?Keep electrical cords out of the way. ?Do not use floor polish or wax that makes floors slippery. If you must use wax, use non-skid floor wax. ?Do not have throw rugs and other things on the floor that can make you trip. ?What can I do with my stairs? ?Do not leave any items on the stairs. ?Make sure that there are handrails on both sides of the stairs and use them. Fix handrails that are broken or loose. Make sure that handrails are as long as the stairways. ?Check any carpeting to make sure that it is firmly attached to the stairs. Fix any carpet that is loose or worn. ?Avoid having throw rugs at the top or bottom of the stairs. If  you do have throw rugs, attach them to the floor with carpet tape. ?Make sure that you have a light switch at the top of the stairs and the bottom of the stairs. If you do not have them, ask someone to add them for you. ?What else can I do to help prevent falls? ?Wear shoes that: ?Do not have high heels. ?Have rubber bottoms. ?Are comfortable and fit you well. ?Are closed at the toe. Do not wear sandals. ?If you use a stepladder: ?Make sure that it is fully opened. Do not climb a closed stepladder. ?Make sure that both sides of the stepladder are locked into place. ?Ask someone to hold it for you, if possible. ?Clearly mark and make sure that you can  see: ?Any grab bars or handrails. ?First and last steps. ?Where the edge of each step is. ?Use tools that help you move around (mobility aids) if they are needed. These include: ?Canes. ?Walkers. ?Scooters. ?Crutches

## 2021-11-05 NOTE — Progress Notes (Signed)
?I connected with Donna Woodward today by telephone and verified that I am speaking with the correct person using two identifiers. ?Location patient: home ?Location provider: work ?Persons participating in the virtual visit: [name] LPN. ?  ?I discussed the limitations, risks, security and privacy concerns of performing an evaluation and management service by telephone and the availability of in person appointments. I also discussed with the patient that there may be a patient responsible charge related to this service. The patient expressed understanding and verbally consented to this telephonic visit.  ?  ?Interactive audio and video telecommunications were attempted between this provider and patient, however failed, due to patient having technical difficulties OR patient did not have access to video capability.  We continued and completed visit with audio only. ? ?  ? ?Vital signs may be patient reported or missing. ? ?Subjective:  ? Donna Woodward is a 66 y.o. female who presents for Medicare Annual (Subsequent) preventive examination. ? ?Review of Systems    ? ?Cardiac Risk Factors include: advanced age (>62mn, >>78women);dyslipidemia;hypertension;obesity (BMI >30kg/m2) ? ?   ?Objective:  ?  ?Today's Vitals  ? 11/05/21 0925  ?Weight: 174 lb (78.9 kg)  ?Height: '5\' 3"'$  (1.6 m)  ?PainSc: 4   ? ?Body mass index is 30.82 kg/m?. ? ? ?  11/05/2021  ?  9:34 AM 10/24/2020  ?  9:00 AM 09/27/2020  ? 10:19 AM 02/18/2019  ?  2:41 PM 01/15/2017  ?  1:51 PM 01/13/2017  ?  1:52 PM 01/08/2017  ?  8:18 AM  ?Advanced Directives  ?Does Patient Have a Medical Advance Directive? No No No No No No No  ?Would patient like information on creating a medical advance directive?  No - Patient declined Yes (MAU/Ambulatory/Procedural Areas - Information given);No - Patient declined  No - Patient declined No - Patient declined No - Patient declined  ? ? ?Current Medications (verified) ?Outpatient Encounter Medications as of  11/05/2021  ?Medication Sig  ? acetaminophen (TYLENOL) 325 MG tablet Take 325-650 mg by mouth every 6 (six) hours as needed for mild pain or headache.  ? aspirin EC 81 MG tablet Take 1 tablet (81 mg total) by mouth daily.  ? atorvastatin (LIPITOR) 40 MG tablet Take 1 tablet (40 mg total) by mouth daily.  ? Calcium Carb-Cholecalciferol (CALCIUM + D3 PO) Take 1 tablet by mouth daily with breakfast.  ? cyclobenzaprine (FLEXERIL) 5 MG tablet TAKE 1 TO 2 TABLETS AT BEDTIME AS NEEDED FOR  MUSCLE  SPASM  ? lisinopril (ZESTRIL) 40 MG tablet Take 1 tablet (40 mg total) by mouth daily.  ? metoprolol succinate (TOPROL-XL) 100 MG 24 hr tablet TAKE 1 TABLET DAILY. TAKE WITH OR IMMEDIATELY FOLLOWING A MEAL.  ? NIFEdipine (PROCARDIA-XL/NIFEDICAL-XL) 30 MG 24 hr tablet TAKE 1 TABLET EVERY DAY  ? Omega-3 Fatty Acids (FISH OIL) 1000 MG CAPS Take 1,000 mg by mouth in the morning and at bedtime.   ? spironolactone (ALDACTONE) 25 MG tablet TAKE 1 TABLET EVERY DAY  ? ?No facility-administered encounter medications on file as of 11/05/2021.  ? ? ?Allergies (verified) ?Bee venom  ? ?History: ?Past Medical History:  ?Diagnosis Date  ? Anxiety   ? Back pain   ? Coronary artery disease   ? CABGx3; Seen by Dr. NJohnsie Cancel ? Degenerative joint disease (DJD) of lumbar spine 03/24/2016  ? Depression   ? GERD (gastroesophageal reflux disease)   ? if overweight  ? Hyperlipidemia   ? Hypertension   ? ?  Past Surgical History:  ?Procedure Laterality Date  ? BACK SURGERY    ? CARDIAC CATHETERIZATION N/A 06/05/2016  ? Procedure: Left Heart Cath and Coronary Angiography;  Surgeon: Belva Crome, MD;  Location: [facility];  Service: Cardiovascular;  Laterality: N/A;  ? CORONARY ARTERY BYPASS GRAFT N/A 06/17/2016  ? Procedure: CORONARY ARTERY BYPASS GRAFTING (CABG), ON PUMP, TIMES THREE, USING LEFT INTERNAL MAMMARY ARTERY, RIGHT GREATER SAPHENOUS VEIN HARVESTED ENDOSCOPICALLY;  Surgeon: Ivin Poot, MD;  Location: [facility];  Service: Open Heart Surgery;   Laterality: N/A;  ? MULTIPLE TOOTH EXTRACTIONS    ? [facility]  ? lamnectomy L5S1  ? TEE WITHOUT CARDIOVERSION N/A 06/17/2016  ? Procedure: TRANSESOPHAGEAL ECHOCARDIOGRAM (TEE);  Surgeon: Ivin Poot, MD;  Location: [facility];  Service: Open Heart Surgery;  Laterality: N/A;  ? ?Family History  ?Problem Relation Age of Onset  ? Hypertension Mother   ? Stroke Father   ? Heart disease Father   ? Dementia Father   ? Hypertension Sister   ? CAD Sister 6  ?     cabg  ? Hypertension Sister   ? Cancer Sister 23  ?     rectal  ? Hypertension Sister   ? ?Social History  ? ?Socioeconomic History  ? Marital status: Single  ?  Spouse name: Not on file  ? Number of children: Not on file  ? Years of education: 44  ? Highest education level: Not on file  ?Occupational History  ? Occupation: disabled  ?  Comment: warehouse  ?Tobacco Use  ? Smoking status: Former  ?  Types: Cigarettes  ?  Quit date: 06/30/2006  ?  Years since quitting: 15.3  ? Smokeless tobacco: Never  ?Vaping Use  ? Vaping Use: Not on file  ?Substance and Sexual Activity  ? Alcohol use: Yes  ?  Comment: rare  ? Drug use: Yes  ?  Types: Marijuana  ? Sexual activity: Not Currently  ?Other Topics Concern  ? Not on file  ?Social History Narrative  ? Masters in behavioral health  ? Live with room mate.  ? Grew up in [place] and lived here for since 2014.   ? Disabled with back pain.   ? ?Social Determinants of Health  ? ?Financial Resource Strain: Low Risk   ? Difficulty of Paying Living Expenses: Not hard at all  ?Food Insecurity: No Food Insecurity  ? Worried About Charity fundraiser in the Last Year: Never true  ? Ran Out of Food in the Last Year: Never true  ?Transportation Needs: No Transportation Needs  ? Lack of Transportation (Medical): No  ? Lack of Transportation (Non-Medical): No  ?Physical Activity: Inactive  ? Days of Exercise per Week: 0 days  ? Minutes of Exercise per Session: 0 min  ?Stress: No Stress Concern Present  ? Feeling of Stress : Not  at all  ?Social Connections: Not on file  ? ? ?Tobacco Counseling ?Counseling given: Not Answered ? ? ?Clinical Intake: ? ?Pre-visit preparation completed: Yes ? ?Pain : 0-10 ?Pain Score: 4  ?Pain Type: Chronic pain ?Pain Location: Back ?Pain Orientation: Lower ?Pain Descriptors / Indicators: Aching ?Pain Onset: More than a month ago ?Pain Frequency: Constant ? ?  ? ?Nutritional Status: BMI > 30  Obese ?Nutritional Risks: None ?Diabetes: No ? ?How often do you need to have someone help you when you read instructions, pamphlets, or other written materials from your doctor or pharmacy?: 1 -  Never ?What is the last grade level you completed in school?: masters degree ? ?Diabetic? no ? ?Interpreter Needed?: No ? ?Information entered by :: NAllen LPN ? ? ?Activities of Daily Living ? ?  11/05/2021  ?  9:41 AM  ?In your present state of health, do you have any difficulty performing the following activities:  ?Hearing? 1  ?Vision? 1  ?Difficulty concentrating or making decisions? 0  ?Walking or climbing stairs? 1  ?Dressing or bathing? 0  ?Doing errands, shopping? 0  ?Preparing Food and eating ? N  ?Using the Toilet? N  ?In the past six months, have you accidently leaked urine? Y  ?Comment with coughing and sneezing  ?Do you have problems with loss of bowel control? N  ?Managing your Medications? N  ?Managing your Finances? N  ?Housekeeping or managing your Housekeeping? N  ? ? ?Patient Care Team: ?Billie Ruddy, MD as PCP - General (Family Medicine) ?Josue Hector, MD as PCP - Cardiology (Cardiology) ? ?Indicate any recent Medical Services you may have received from other than [facility] providers in the past year (date may be approximate). ? ?   ?Assessment:  ? This is a routine wellness examination for Donna Woodward. ? ?Hearing/Vision screen ?Vision Screening - Comments:: Regular eye exams, [facility] ? ?Dietary issues and exercise activities discussed: ?Current Exercise Habits: The patient does not participate in regular  exercise at present ? ? Goals Addressed   ? ?  ?  ?  ?  ? This Visit's Progress  ?  Patient Stated     ?  11/05/2021, no goals ?  ? ?  ? ?Depression Screen ? ?  11/05/2021  ?  9:35 AM 10/03/2021  ?  8:47 AM 4/27/2

## 2021-11-11 ENCOUNTER — Encounter: Payer: Self-pay | Admitting: Family Medicine

## 2022-01-06 ENCOUNTER — Other Ambulatory Visit: Payer: Self-pay

## 2022-01-06 ENCOUNTER — Encounter: Payer: Self-pay | Admitting: Gastroenterology

## 2022-01-06 MED ORDER — ATORVASTATIN CALCIUM 40 MG PO TABS
40.0000 mg | ORAL_TABLET | Freq: Every day | ORAL | 3 refills | Status: DC
Start: 2022-01-06 — End: 2022-11-04

## 2022-01-06 NOTE — Telephone Encounter (Signed)
Mail order requests refill atorvastatin 40 mg qd #90, RF:3    sent

## 2022-01-30 ENCOUNTER — Ambulatory Visit (AMBULATORY_SURGERY_CENTER): Payer: Self-pay

## 2022-01-30 ENCOUNTER — Other Ambulatory Visit: Payer: Self-pay | Admitting: Cardiovascular Disease

## 2022-01-30 VITALS — Ht 63.0 in | Wt 171.0 lb

## 2022-01-30 DIAGNOSIS — Z1211 Encounter for screening for malignant neoplasm of colon: Secondary | ICD-10-CM

## 2022-01-30 MED ORDER — NA SULFATE-K SULFATE-MG SULF 17.5-3.13-1.6 GM/177ML PO SOLN: 1.0000 | ORAL | 0 refills | Status: DC

## 2022-01-30 NOTE — Progress Notes (Signed)
No egg or soy allergy known to patient  No issues known to pt with past sedation with any surgeries or procedures Patient denies ever being told they had issues or difficulty with intubation  No FH of Malignant Hyperthermia Pt is not on diet pills Pt is not on  home 02  Pt is not on blood thinners  Pt denies issues with constipation  No A fib or A flutter Have any cardiac testing pending--denied Pt instructed to use [email or web address] or GoodRx for a price reduction on prep   

## 2022-02-13 ENCOUNTER — Encounter: Payer: Self-pay | Admitting: Family Medicine

## 2022-02-17 ENCOUNTER — Other Ambulatory Visit: Payer: Self-pay | Admitting: Family Medicine

## 2022-02-17 DIAGNOSIS — I10 Essential (primary) hypertension: Secondary | ICD-10-CM

## 2022-02-19 ENCOUNTER — Encounter: Payer: Self-pay | Admitting: Gastroenterology

## 2022-02-23 ENCOUNTER — Encounter: Payer: Self-pay | Admitting: Certified Registered Nurse Anesthetist

## 2022-02-27 ENCOUNTER — Encounter: Payer: Medicare HMO | Admitting: Gastroenterology

## 2022-03-23 ENCOUNTER — Encounter: Payer: Self-pay | Admitting: Family Medicine

## 2022-03-27 ENCOUNTER — Ambulatory Visit (INDEPENDENT_AMBULATORY_CARE_PROVIDER_SITE_OTHER): Payer: Medicare HMO | Admitting: *Deleted

## 2022-03-27 DIAGNOSIS — Z23 Encounter for immunization: Secondary | ICD-10-CM | POA: Diagnosis not present

## 2022-03-28 ENCOUNTER — Encounter: Payer: Self-pay | Admitting: Family Medicine

## 2022-03-28 ENCOUNTER — Encounter: Payer: Self-pay | Admitting: Cardiovascular Disease

## 2022-03-28 DIAGNOSIS — E782 Mixed hyperlipidemia: Secondary | ICD-10-CM

## 2022-04-01 ENCOUNTER — Encounter: Payer: Self-pay | Admitting: Family Medicine

## 2022-04-01 ENCOUNTER — Ambulatory Visit: Payer: Medicare HMO | Attending: Cardiovascular Disease

## 2022-04-01 DIAGNOSIS — I25810 Atherosclerosis of coronary artery bypass graft(s) without angina pectoris: Secondary | ICD-10-CM

## 2022-04-01 DIAGNOSIS — E785 Hyperlipidemia, unspecified: Secondary | ICD-10-CM

## 2022-04-01 DIAGNOSIS — I10 Essential (primary) hypertension: Secondary | ICD-10-CM

## 2022-04-02 ENCOUNTER — Telehealth: Payer: Self-pay | Admitting: Cardiovascular Disease

## 2022-04-02 LAB — LIPID PANEL
Chol/HDL Ratio: 5.3 ratio — ABNORMAL HIGH (ref 0.0–4.4)
Cholesterol, Total: 132 mg/dL (ref 100–199)
HDL: 25 mg/dL — ABNORMAL LOW (ref >39)
LDL Chol Calc (NIH): 79 mg/dL (ref 0–99)
Triglycerides: 162 mg/dL — ABNORMAL HIGH (ref 0–149)
VLDL Cholesterol Cal: 28 mg/dL (ref 5–40)

## 2022-04-02 LAB — HEPATIC FUNCTION PANEL
ALT: 22 IU/L (ref 0–32)
AST: 15 IU/L (ref 0–40)
Albumin: 4.2 g/dL (ref 3.9–4.9)
Alkaline Phosphatase: 58 IU/L (ref 44–121)
Bilirubin Total: 0.3 mg/dL (ref 0.0–1.2)
Bilirubin, Direct: 0.11 mg/dL (ref 0.00–0.40)
Total Protein: 6.5 g/dL (ref 6.0–8.5)

## 2022-04-02 NOTE — Telephone Encounter (Signed)
Pt called in returning [facility] Call about her labs.  Best contact  number is [phone]

## 2022-04-02 NOTE — Telephone Encounter (Signed)
Patient aware of results.

## 2022-04-09 ENCOUNTER — Ambulatory Visit (AMBULATORY_SURGERY_CENTER): Payer: Medicare HMO | Admitting: Gastroenterology

## 2022-04-09 ENCOUNTER — Encounter: Payer: Self-pay | Admitting: Gastroenterology

## 2022-04-09 VITALS — BP 107/60 | HR 54 | Temp 97.5°F | Resp 13 | Ht 63.0 in | Wt 171.0 lb

## 2022-04-09 DIAGNOSIS — I2510 Atherosclerotic heart disease of native coronary artery without angina pectoris: Secondary | ICD-10-CM | POA: Diagnosis not present

## 2022-04-09 DIAGNOSIS — D125 Benign neoplasm of sigmoid colon: Secondary | ICD-10-CM

## 2022-04-09 DIAGNOSIS — Z1211 Encounter for screening for malignant neoplasm of colon: Secondary | ICD-10-CM

## 2022-04-09 DIAGNOSIS — I10 Essential (primary) hypertension: Secondary | ICD-10-CM | POA: Diagnosis not present

## 2022-04-09 MED ORDER — SODIUM CHLORIDE 0.9 % IV SOLN: 500.0000 mL | Freq: Once | INTRAVENOUS | Status: DC

## 2022-04-09 NOTE — Progress Notes (Signed)
[facility] History and Physical   Primary Care Physician:  Billie Ruddy, MD   Reason for Procedure:  Colorectal cancer screening  Plan:    Screening colonoscopy with possible interventions as needed     HPI: Donna Woodward is a very pleasant 66 y.o. female here for screening colonoscopy. Denies any nausea, vomiting, abdominal pain, melena or bright red blood per rectum  The risks and benefits as well as alternatives of endoscopic procedure(s) have been discussed and reviewed. All questions answered. The patient agrees to proceed.    Past Medical History:  Diagnosis Date   Anxiety    Back pain    Cataract    Coronary artery disease    CABGx3; Seen by Dr. Johnsie Cancel   Degenerative joint disease (DJD) of lumbar spine 03/24/2016   Depression    GERD (gastroesophageal reflux disease)    if overweight   Hyperlipidemia    Hypertension     Past Surgical History:  Procedure Laterality Date   BACK SURGERY     CARDIAC CATHETERIZATION N/A 06/05/2016   Procedure: Left Heart Cath and Coronary Angiography;  Surgeon: Belva Crome, MD;  Location: [facility];  Service: Cardiovascular;  Laterality: N/A;   CORONARY ARTERY BYPASS GRAFT N/A 06/17/2016   Procedure: CORONARY ARTERY BYPASS GRAFTING (CABG), ON PUMP, TIMES THREE, USING LEFT INTERNAL MAMMARY ARTERY, RIGHT GREATER SAPHENOUS VEIN HARVESTED ENDOSCOPICALLY;  Surgeon: Ivin Poot, MD;  Location: [facility];  Service: Open Heart Surgery;  Laterality: N/A;   MULTIPLE TOOTH EXTRACTIONS     SPINE SURGERY  1990   lamnectomy L5S1   TEE WITHOUT CARDIOVERSION N/A 06/17/2016   Procedure: TRANSESOPHAGEAL ECHOCARDIOGRAM (TEE);  Surgeon: Ivin Poot, MD;  Location: [facility];  Service: Open Heart Surgery;  Laterality: N/A;    Prior to Admission medications   Medication Sig Start Date End Date Taking? Authorizing Provider  aspirin EC 81 MG tablet Take 1 tablet (81 mg total) by mouth daily. 11/06/17  Yes Hagler, Apolonio Schneiders, MD   atorvastatin (LIPITOR) 40 MG tablet Take 1 tablet (40 mg total) by mouth daily. 01/06/22  Yes Josue Hector, MD  Calcium Carb-Cholecalciferol (CALCIUM + D3 PO) Take 1 tablet by mouth daily with breakfast.   Yes [provider]  cyclobenzaprine (FLEXERIL) 5 MG tablet TAKE 1 TO 2 TABLETS AT BEDTIME AS NEEDED FOR  MUSCLE  SPASM 02/18/22  Yes Billie Ruddy, MD  lisinopril (ZESTRIL) 40 MG tablet TAKE 1 TABLET EVERY DAY 02/18/22  Yes Billie Ruddy, MD  metoprolol succinate (TOPROL-XL) 100 MG 24 hr tablet TAKE 1 TABLET DAILY. TAKE WITH OR IMMEDIATELY FOLLOWING A MEAL. 01/30/22  Yes Josue Hector, MD  NIFEdipine (PROCARDIA-XL/NIFEDICAL-XL) 30 MG 24 hr tablet TAKE 1 TABLET EVERY DAY 04/03/21  Yes Billie Ruddy, MD  Omega-3 Fatty Acids (FISH OIL) 1000 MG CAPS Take 1,000 mg by mouth in the morning and at bedtime.    Yes [provider]  spironolactone (ALDACTONE) 25 MG tablet TAKE 1 TABLET EVERY DAY 10/17/21  Yes Josue Hector, MD    Current Outpatient Medications  Medication Sig Dispense Refill   aspirin EC 81 MG tablet Take 1 tablet (81 mg total) by mouth daily.     atorvastatin (LIPITOR) 40 MG tablet Take 1 tablet (40 mg total) by mouth daily. 90 tablet 3   Calcium Carb-Cholecalciferol (CALCIUM + D3 PO) Take 1 tablet by mouth daily with breakfast.     cyclobenzaprine (FLEXERIL) 5 MG tablet  TAKE 1 TO 2 TABLETS AT BEDTIME AS NEEDED FOR  MUSCLE  SPASM 90 tablet 1   lisinopril (ZESTRIL) 40 MG tablet TAKE 1 TABLET EVERY DAY 90 tablet 3   metoprolol succinate (TOPROL-XL) 100 MG 24 hr tablet TAKE 1 TABLET DAILY. TAKE WITH OR IMMEDIATELY FOLLOWING A MEAL. 90 tablet 2   NIFEdipine (PROCARDIA-XL/NIFEDICAL-XL) 30 MG 24 hr tablet TAKE 1 TABLET EVERY DAY 90 tablet 2   Omega-3 Fatty Acids (FISH OIL) 1000 MG CAPS Take 1,000 mg by mouth in the morning and at bedtime.      spironolactone (ALDACTONE) 25 MG tablet TAKE 1 TABLET EVERY DAY 90 tablet 0   Current Facility-Administered Medications   Medication Dose Route Frequency Provider Last Rate Last Admin   0.9 %  sodium chloride infusion  500 mL Intravenous Once Mauri Pole, MD        Allergies as of 04/09/2022 - Review Complete 04/09/2022  Allergen Reaction Noted   Bee venom Swelling and Other (See Comments) 02/09/2020    Family History  Problem Relation Age of Onset   Hypertension Mother    Stroke Father    Heart disease Father    Dementia Father    Hypertension Sister    CAD Sister 33       cabg   Rectal cancer Sister    Hypertension Sister    Cancer Sister 71       rectal   Hypertension Sister    Stomach cancer Neg Hx    Colon polyps Neg Hx    Colon cancer Neg Hx     Social History   Socioeconomic History   Marital status: Single    Spouse name: Not on file   Number of children: Not on file   Years of education: 18   Highest education level: Master's degree (e.g., MA, MS, MEng, MEd, MSW, MBA)  Occupational History   Occupation: disabled    Comment: warehouse  Tobacco Use   Smoking status: Former    Types: Cigarettes    Quit date: 12/04/2006    Years since quitting: 15.3   Smokeless tobacco: Never  Vaping Use   Vaping Use: Not on file  Substance and Sexual Activity   Alcohol use: Yes    Comment: rare   Drug use: Yes    Types: Marijuana   Sexual activity: Not Currently  Other Topics Concern   Not on file  Social History Narrative   Masters in behavioral health   Live with room mate.   Grew up in [place] and lived here for since 2014.    Disabled with back pain.    Social Determinants of Health   Financial Resource Strain: Low Risk  (04/07/2022)   Overall Financial Resource Strain (CARDIA)    Difficulty of Paying Living Expenses: Not hard at all  Food Insecurity: No Food Insecurity (04/07/2022)   Hunger Vital Sign    Worried About Running Out of Food in the Last Year: Never true    Ran Out of Food in the Last Year: Never true  Transportation Needs: No Transportation Needs  (04/07/2022)   PRAPARE - Hydrologist (Medical): No    Lack of Transportation (Non-Medical): No  Physical Activity: Insufficiently Active (04/07/2022)   Exercise Vital Sign    Days of Exercise per Week: 4 days    Minutes of Exercise per Session: 10 min  Stress: Stress Concern Present (04/07/2022)   [place]  Stress Questionnaire    Feeling of Stress : To some extent  Social Connections: Unknown (04/07/2022)   Social Connection and Isolation Panel [NHANES]    Frequency of Communication with Friends and Family: Never    Frequency of Social Gatherings with Friends and Family: Never    Attends Religious Services: Never    Marine scientist or Organizations: No    Attends Music therapist: Not on file    Marital Status: Patient refused  Intimate Partner Violence: Not At Risk (10/24/2020)   Humiliation, Afraid, Rape, and Kick questionnaire    Fear of Current or Ex-Partner: No    Emotionally Abused: No    Physically Abused: No    Sexually Abused: No    Review of Systems:  All other review of systems negative except as mentioned in the HPI.  Physical Exam: Vital signs in last 24 hours: Blood Pressure (Abnormal) 157/87   Pulse 65   Temperature (Abnormal) 97.5 F (36.4 C)   Height '5\' 3"'$  (1.6 m)   Weight 171 lb (77.6 kg)   Oxygen Saturation 97%   Body Mass Index 30.29 kg/m  General:   Alert, NAD Lungs:  Clear .   Heart:  Regular rate and rhythm Abdomen:  Soft, nontender and nondistended. Neuro/Psych:  Alert and cooperative. Normal mood and affect. A and O x 3  Reviewed labs, radiology imaging, old records and pertinent past GI work up  Patient is appropriate for planned procedure(s) and anesthesia in an ambulatory setting   K. Denzil Magnuson , MD [phone]

## 2022-04-09 NOTE — Patient Instructions (Addendum)
-   Patient has a contact number available for emergencies. The signs and symptoms of potential delayed complications were discussed with the patient. Return to normal activities tomorrow. Written discharge instructions were provided to the patient. - Resume previous diet. - Continue present medications. - Await pathology results. - Repeat colonoscopy in 3 years for surveillance based on pathology results. -Handout on hemorrhoids and polyps provided   YOU HAD AN ENDOSCOPIC PROCEDURE TODAY AT [facility]:   Refer to the procedure report that was given to you for any specific questions about what was found during the examination.  If the procedure report does not answer your questions, please call your gastroenterologist to clarify.  If you requested that your care partner not be given the details of your procedure findings, then the procedure report has been included in a sealed envelope for you to review at your convenience later.  YOU SHOULD EXPECT: Some feelings of bloating in the abdomen. Passage of more gas than usual.  Walking can help get rid of the air that was put into your GI tract during the procedure and reduce the bloating. If you had a lower endoscopy (such as a colonoscopy or flexible sigmoidoscopy) you may notice spotting of blood in your stool or on the toilet paper. If you underwent a bowel prep for your procedure, you may not have a normal bowel movement for a few days.  Please Note:  You might notice some irritation and congestion in your nose or some drainage.  This is from the oxygen used during your procedure.  There is no need for concern and it should clear up in a day or so.  SYMPTOMS TO REPORT IMMEDIATELY:  Following lower endoscopy (colonoscopy or flexible sigmoidoscopy):  Excessive amounts of blood in the stool  Significant tenderness or worsening of abdominal pains  Swelling of the abdomen that is new, acute  Fever of 100F or higher   For urgent or  emergent issues, a gastroenterologist can be reached at any hour by calling [phone]. Do not use MyChart messaging for urgent concerns.    DIET:  We do recommend a small meal at first, but then you may proceed to your regular diet.  Drink plenty of fluids but you should avoid alcoholic beverages for 24 hours.  ACTIVITY:  You should plan to take it easy for the rest of today and you should NOT DRIVE or use heavy machinery until tomorrow (because of the sedation medicines used during the test).    FOLLOW UP: Our staff will call the number listed on your records the next business day following your procedure.  We will call around 7:15- 8:00 am to check on you and address any questions or concerns that you may have regarding the information given to you following your procedure. If we do not reach you, we will leave a message.     If any biopsies were taken you will be contacted by phone or by letter within the next 1-3 weeks.  Please call us at [phone] if you have not heard about the biopsies in 3 weeks.    SIGNATURES/CONFIDENTIALITY: You and/or your care partner have signed paperwork which will be entered into your electronic medical record.  These signatures attest to the fact that that the information above on your After Visit Summary has been reviewed and is understood.  Full responsibility of the confidentiality of this discharge information lies with you and/or your care-partner.

## 2022-04-09 NOTE — Progress Notes (Signed)
VS by SM  Pt's states no medical or surgical changes since previsit or office visit.

## 2022-04-09 NOTE — Op Note (Signed)
[facility] Patient Name: Donna Woodward Procedure Date: 04/09/2022 8:45 AM MRN: [id number] Endoscopist: Mauri Pole , MD Age: 66 Referring MD:  Date of Birth: 1956/05/12 Gender: Female Account #: [id number] Procedure:                Colonoscopy Indications:              Screening for colorectal malignant neoplasm Medicines:                Monitored Anesthesia Care Procedure:                Pre-Anesthesia Assessment:                           - Prior to the procedure, a History and Physical                            was performed, and patient medications and                            allergies were reviewed. The patient's tolerance of                            previous anesthesia was also reviewed. The risks                            and benefits of the procedure and the sedation                            options and risks were discussed with the patient.                            All questions were answered, and informed consent                            was obtained. ASA Grade Assessment: III - A patient                            with severe systemic disease. After reviewing the                            risks and benefits, the patient was deemed in                            satisfactory condition to undergo the procedure.                           After obtaining informed consent, the colonoscope                            was passed under direct vision. Throughout the                            procedure, the patient's blood pressure, pulse, and  oxygen saturations were monitored continuously. The                            PCF-HQ190L Colonoscope was introduced through the                            anus and advanced to the the cecum, identified by                            appendiceal orifice and ileocecal valve. The                            colonoscopy was performed without difficulty. The                             patient tolerated the procedure well. The quality                            of the bowel preparation was good. The ileocecal                            valve, appendiceal orifice, and rectum were                            photographed. Scope In: 8:51:06 AM Scope Out: 9:01:03 AM Scope Withdrawal Time: 0 hours 7 minutes 7 seconds  Total Procedure Duration: 0 hours 9 minutes 57 seconds  Findings:                 The perianal and digital rectal examinations were                            normal.                           A 18 mm polyp was found in the sigmoid colon. The                            polyp was multi-lobulated, ulcerated and                            pedunculated. The polyp was removed with a hot                            snare. Resection and retrieval were complete.                           Non-bleeding external and internal hemorrhoids were                            found during retroflexion. The hemorrhoids were                            medium-sized. Complications:            No immediate complications. Estimated Blood Loss:  Estimated blood loss was minimal. Impression:               - One 18 mm polyp in the sigmoid colon, removed                            with a hot snare. Resected and retrieved.                           - Non-bleeding external and internal hemorrhoids. Recommendation:           - Patient has a contact number available for                            emergencies. The signs and symptoms of potential                            delayed complications were discussed with the                            patient. Return to normal activities tomorrow.                            Written discharge instructions were provided to the                            patient.                           - Resume previous diet.                           - Continue present medications.                           - Await pathology results.                           -  Repeat colonoscopy in 3 years for surveillance                            based on pathology results. Mauri Pole, MD 04/09/2022 9:20:19 AM This report has been signed electronically.

## 2022-04-09 NOTE — Progress Notes (Signed)
Report given to PACU, vss 

## 2022-04-09 NOTE — Progress Notes (Signed)
Called to room to assist during endoscopic procedure.  Patient ID and intended procedure confirmed with present staff. Received instructions for my participation in the procedure from the performing physician.  

## 2022-04-10 ENCOUNTER — Telehealth: Payer: Self-pay

## 2022-04-10 NOTE — Telephone Encounter (Signed)
  Follow up Call-     04/09/2022    7:41 AM  Call back number  Post procedure Call Back phone  # [phone]  Permission to leave phone message Yes     Patient questions:  Do you have a fever, pain , or abdominal swelling? No. Pain Score  0 *  Have you tolerated food without any problems? Yes.    Have you been able to return to your normal activities? Yes.    Do you have any questions about your discharge instructions: Diet   No. Medications  No. Follow up visit  No.  Do you have questions or concerns about your Care? No.  Actions: * If pain score is 4 or above: No action needed, pain <4.

## 2022-04-11 ENCOUNTER — Encounter: Payer: Self-pay | Admitting: Family Medicine

## 2022-04-11 ENCOUNTER — Ambulatory Visit (INDEPENDENT_AMBULATORY_CARE_PROVIDER_SITE_OTHER): Payer: Medicare HMO | Admitting: Family Medicine

## 2022-04-11 VITALS — BP 122/76 | HR 66 | Temp 98.2°F | Wt 163.2 lb

## 2022-04-11 DIAGNOSIS — R7303 Prediabetes: Secondary | ICD-10-CM

## 2022-04-11 DIAGNOSIS — M79671 Pain in right foot: Secondary | ICD-10-CM

## 2022-04-11 DIAGNOSIS — M25561 Pain in right knee: Secondary | ICD-10-CM | POA: Diagnosis not present

## 2022-04-11 NOTE — Progress Notes (Signed)
Subjective:    Patient ID: Donna Woodward, female    DOB: [birth year], 66 y.o.   MRN: [id number]  Chief Complaint  Patient presents with   Follow-up    Sharp pains in heel of rt foot on both sides of ankle .  Right knee pain on both side of knee, and under knee cap.    HPI Patient was seen today for acute concerns.  Pt with R heel pain and pain along side of lower ankle/foot x 2 months.  Had some edema.  R knee pain and pain behind knee cap started 2 wks ago.  Pt denies injury.  Elevates LEs when sitting.  Wearing older tennis shoes with a worn insole.  Heel and knee not currently hurting while in clinic.  Patient states she is making diet changes.  Visualized carbs including rice, Risotto,etc which she eats daily.  Hemoglobin A1c 6.2% on 10/03/2021.   Past Medical History:  Diagnosis Date   Anxiety    Back pain    Cataract    Coronary artery disease    CABGx3; Seen by Dr. Johnsie Cancel   Degenerative joint disease (DJD) of lumbar spine 03/24/2016   Depression    GERD (gastroesophageal reflux disease)    if overweight   Hyperlipidemia    Hypertension     Allergies  Allergen Reactions   Bee Venom Swelling and Other (See Comments)    Swells where stung- no breathing issues    ROS General: Denies fever, chills, night sweats, changes in weight, changes in appetite HEENT: Denies headaches, ear pain, changes in vision, rhinorrhea, sore throat CV: Denies CP, palpitations, SOB, orthopnea Pulm: Denies SOB, cough, wheezing GI: Denies abdominal pain, nausea, vomiting, diarrhea, constipation GU: Denies dysuria, hematuria, frequency, vaginal discharge Msk: Denies muscle cramps +R heel pain, R knee pain Neuro: Denies weakness, numbness, tingling Skin: Denies rashes, bruising Psych: Denies depression, anxiety, hallucinations     Objective:    Blood pressure 122/76, pulse 66, temperature 98.2 F (36.8 C), temperature source Oral, weight 163 lb 3.2 oz (74 kg), SpO2 96 %.  Gen. Pleasant,  well-nourished, in no distress, normal affect   HEENT: [place]/AT, face symmetric, conjunctiva clear, no scleral icterus, PERRLA, EOMI, nares patent without drainage Lungs: no accessory muscle use Cardiovascular: RRR, no m/r/g, no peripheral edema Musculoskeletal: No LE edema or erythema.  No TTP of heel, Achilles tendon, midfoot, or ankle.  No ankle instability.  Normal inversion and eversion of right ankle.  No crepitus of bilateral knees.  No TTP of joint line or patella of right knee. no deformities, no cyanosis or clubbing, normal tone.  Ankle wrapped in clinic with Ace bandage. Neuro:  A&Ox3, CN II-XII intact, normal gait Skin:  Warm, no lesions/ rash.  Well-healed surgical incision medial right lower leg   Wt Readings from Last 3 Encounters:  04/11/22 163 lb 3.2 oz (74 kg)  04/09/22 171 lb (77.6 kg)  01/30/22 171 lb (77.6 kg)    Lab Results  Component Value Date   WBC 10.1 10/03/2021   HGB 14.8 10/03/2021   HCT 44.5 10/03/2021   PLT 234.0 10/03/2021   GLUCOSE 118 (H) 10/03/2021   CHOL 132 04/01/2022   TRIG 162 (H) 04/01/2022   HDL 25 (L) 04/01/2022   LDLCALC 79 04/01/2022   ALT 22 04/01/2022   AST 15 04/01/2022   NA 140 10/03/2021   K 4.6 10/03/2021   CL 103 10/03/2021   CREATININE 0.94 10/03/2021   BUN 24 (H) 10/03/2021  CO2 30 10/03/2021   TSH 2.15 10/03/2021   INR 1.23 06/17/2016   HGBA1C 6.2 10/03/2021    Assessment/Plan:  Pain of right heel -Discussed possible causes including arch dropping, heel spur, sprain. -Likely 2/2 decreased arch support -Patient encouraged to obtain OTC insoles vs new tennis shoes -Discussed stretching exercises, heat, ice, topical analgesics, Tylenol as needed -Ankle/arch wrapped with Ace bandage while in clinic -For continued symptoms obtain imaging to rule out heel spur for as cause of symptoms  Acute pain of right knee -Likely 2/2 adjusting walk due to heel pain -Supportive care -For continued symptoms obtain  imaging  Prediabetes -Hemoglobin A1c 6.2% on 10/03/2021 -Continue lifestyle modifications -Given handout  F/u as needed for continued or worsening symptoms  Grier Mitts, MD

## 2022-04-12 ENCOUNTER — Other Ambulatory Visit: Payer: Self-pay | Admitting: Family Medicine

## 2022-04-12 ENCOUNTER — Other Ambulatory Visit: Payer: Self-pay | Admitting: Cardiovascular Disease

## 2022-04-12 DIAGNOSIS — I10 Essential (primary) hypertension: Secondary | ICD-10-CM

## 2022-04-16 ENCOUNTER — Encounter: Payer: Self-pay | Admitting: Gastroenterology

## 2022-05-06 ENCOUNTER — Encounter: Payer: Self-pay | Admitting: Family Medicine

## 2022-05-08 ENCOUNTER — Encounter: Payer: Self-pay | Admitting: Family Medicine

## 2022-05-08 ENCOUNTER — Ambulatory Visit (INDEPENDENT_AMBULATORY_CARE_PROVIDER_SITE_OTHER): Payer: Medicare HMO | Admitting: Family Medicine

## 2022-05-08 ENCOUNTER — Ambulatory Visit (INDEPENDENT_AMBULATORY_CARE_PROVIDER_SITE_OTHER): Payer: Medicare HMO

## 2022-05-08 VITALS — BP 102/62 | HR 63 | Temp 97.5°F | Wt 163.4 lb

## 2022-05-08 DIAGNOSIS — M7731 Calcaneal spur, right foot: Secondary | ICD-10-CM

## 2022-05-08 DIAGNOSIS — M79671 Pain in right foot: Secondary | ICD-10-CM

## 2022-05-08 DIAGNOSIS — M19079 Primary osteoarthritis, unspecified ankle and foot: Secondary | ICD-10-CM

## 2022-05-08 DIAGNOSIS — M19071 Primary osteoarthritis, right ankle and foot: Secondary | ICD-10-CM | POA: Diagnosis not present

## 2022-05-08 NOTE — Progress Notes (Signed)
Subjective:    Patient ID: Donna Woodward, female    DOB: Nov 12, 1955, 66 y.o.   MRN: [id number]  Chief Complaint  Patient presents with   Follow-up    Ankle pain not getting any better.     HPI Patient was seen today for follow-up on R  heel/ankle pain.  Endorses continued intermittent symptoms.  At times pain is of medial right heel going through ankle.  Some mornings taking a step out of bed causes pain in heel/ankle.  Patient tried OTC insoles without improvement in symptoms.  Past Medical History:  Diagnosis Date   Anxiety    Back pain    Cataract    Coronary artery disease    CABGx3; Seen by Dr. Johnsie Cancel   Degenerative joint disease (DJD) of lumbar spine 03/24/2016   Depression    GERD (gastroesophageal reflux disease)    if overweight   Hyperlipidemia    Hypertension     Allergies  Allergen Reactions   Bee Venom Swelling and Other (See Comments)    Swells where stung- no breathing issues    ROS General: Denies fever, chills, night sweats, changes in weight, changes in appetite HEENT: Denies headaches, ear pain, changes in vision, rhinorrhea, sore throat CV: Denies CP, palpitations, SOB, orthopnea Pulm: Denies SOB, cough, wheezing GI: Denies abdominal pain, nausea, vomiting, diarrhea, constipation GU: Denies dysuria, hematuria, frequency, vaginal discharge Msk: Denies muscle cramps, joint pains +R heel/ankle pain Neuro: Denies weakness, numbness, tingling Skin: Denies rashes, bruising Psych: Denies depression, anxiety, hallucinations      Objective:    Blood pressure 102/62, pulse 63, temperature (!) 97.5 F (36.4 C), temperature source Oral, weight 163 lb 6.4 oz (74.1 kg), SpO2 93 %.   Gen. Pleasant, well-nourished, in no distress, normal affect   HEENT: [place]/AT, face symmetric, conjunctiva clear, no scleral icterus, PERRLA, EOMI, nares patent without drainage Lungs: no accessory muscle use Cardiovascular: RRR, no peripheral edema Musculoskeletal: No  deformities, no cyanosis or clubbing, normal tone Neuro:  A&Ox3, CN II-XII intact, ambulating with cane.  Wt Readings from Last 3 Encounters:  05/08/22 163 lb 6.4 oz (74.1 kg)  04/11/22 163 lb 3.2 oz (74 kg)  04/09/22 171 lb (77.6 kg)    Lab Results  Component Value Date   WBC 10.1 10/03/2021   HGB 14.8 10/03/2021   HCT 44.5 10/03/2021   PLT 234.0 10/03/2021   GLUCOSE 118 (H) 10/03/2021   CHOL 132 04/01/2022   TRIG 162 (H) 04/01/2022   HDL 25 (L) 04/01/2022   LDLCALC 79 04/01/2022   ALT 22 04/01/2022   AST 15 04/01/2022   NA 140 10/03/2021   K 4.6 10/03/2021   CL 103 10/03/2021   CREATININE 0.94 10/03/2021   BUN 24 (H) 10/03/2021   CO2 30 10/03/2021   TSH 2.15 10/03/2021   INR 1.23 06/17/2016   HGBA1C 6.2 10/03/2021    Assessment/Plan:  Pain of right heel -continued symptoms despite supportive care including heel pad -Possible causes include heel spur, arthritis, tendinitis -obtain imaging -further recommendations based on imaging including referral to Podiatry vs Ortho, or PT.  - Plan: DG Foot Complete Right  F/u prn  Grier Mitts, MD

## 2022-05-12 NOTE — Addendum Note (Signed)
Addended by: Anderson Malta on: 05/12/2022 04:42 PM   Modules accepted: Orders

## 2022-05-13 ENCOUNTER — Encounter (HOSPITAL_COMMUNITY): Payer: Self-pay

## 2022-05-13 ENCOUNTER — Other Ambulatory Visit: Payer: Self-pay

## 2022-05-13 ENCOUNTER — Emergency Department (HOSPITAL_COMMUNITY)
Admission: EM | Admit: 2022-05-13 | Discharge: 2022-05-14 | Disposition: A | Discharge status: Home / Self Care | Payer: Medicare HMO | Attending: Emergency Medicine | Admitting: Emergency Medicine

## 2022-05-13 DIAGNOSIS — H5789 Other specified disorders of eye and adnexa: Secondary | ICD-10-CM | POA: Insufficient documentation

## 2022-05-13 DIAGNOSIS — Z7982 Long term (current) use of aspirin: Secondary | ICD-10-CM | POA: Diagnosis not present

## 2022-05-13 DIAGNOSIS — Z79899 Other long term (current) drug therapy: Secondary | ICD-10-CM | POA: Diagnosis not present

## 2022-05-13 DIAGNOSIS — Z955 Presence of coronary angioplasty implant and graft: Secondary | ICD-10-CM | POA: Insufficient documentation

## 2022-05-13 DIAGNOSIS — I10 Essential (primary) hypertension: Secondary | ICD-10-CM | POA: Diagnosis not present

## 2022-05-13 NOTE — ED Triage Notes (Signed)
Reports eye flickering and visual changes that lasted a few minutes. Called PCP and directed to ER for evaluation.   Sts she thinks she was dehydrated. Drank some water and sx resolved.

## 2022-05-13 NOTE — ED Provider Triage Note (Signed)
Emergency Medicine Provider Triage Evaluation Note  Donna Woodward , a 66 y.o. female  was evaluated in triage.  Pt complains of floaters in left and right eye.  Patient states that earlier in the day she noted some floaters/flashing lights in her visual field.  She drinks some water that eventually went away.  She then noted them in her right eye.  These also self resolved.  She denies any foreign bodies.  Denies any decrease in vision.  She called her doctor who recommended evaluation in the ER.  She denies any headache, blurry vision, nausea, vomiting, weakness, difficulty speaking.  She states that she has had this in the past but does not remember what the cause of this was.  Does endorse a history of cataracts.  No history of diabetes.  Review of Systems  Positive: As above Negative: As above  Physical Exam  BP (!) 142/81 (BP Location: Right Arm)   Pulse 65   Temp 98.2 F (36.8 C) (Oral)   Resp 16   SpO2 94%  Gen:   Awake, no distress   Resp:  Normal effort  MSK:   Moves extremities without difficulty  Other:  Mental Status:  Alert, thought content appropriate, able to give a coherent history. Speech fluent without evidence of aphasia. Able to follow 2 step commands without difficulty.  Cranial Nerves:  II:  Peripheral visual fields grossly normal, pupils equal, round, reactive to light III,IV, VI: ptosis not present, extra-ocular motions intact bilaterally  V,VII: smile symmetric, facial light touch sensation equal VIII: hearing grossly normal to voice  X: uvula elevates symmetrically  XI: bilateral shoulder shrug symmetric and strong XII: midline tongue extension without fassiculations Motor:  Normal tone. 5/5 strength of BUE and BLE major muscle groups including strong and equal grip strength and dorsiflexion/plantar flexion Sensory: light touch normal in all extremities. Cerebellar: normal finger-to-nose with bilateral upper extremities, Romberg sign absent Gait: normal  gait and balance. Able to walk on toes and heels with ease.    Medical Decision Making  Medically screening exam initiated at 11:25 PM.  Appropriate orders placed.  Viktoria R Janvier was informed that the remainder of the evaluation will be completed by another provider, this initial triage assessment does not replace that evaluation, and the importance of remaining in the ED until their evaluation is complete.  Discussed with Dr. Leonette Monarch, no indication for CT of the head.  No visual field deficits, pupils equal and reactive.   Garald Balding, PA-C 05/13/22 2327

## 2022-05-14 NOTE — Discharge Instructions (Addendum)
Please follow-up with your eye doctor if any symptoms return to the ED for loss of vision, painful vision or new or concerning symptoms.

## 2022-05-14 NOTE — ED Notes (Signed)
All discharge instructions reviewed with patient including follow up care. Patient verbalized understanding of same and had no other questions. Patient stable and ambulatory at time of discharge.

## 2022-05-14 NOTE — ED Provider Notes (Signed)
[place] EMERGENCY DEPARTMENT Provider Note   CSN: [id number] Arrival date & time: 05/13/22  2236     History  Chief Complaint  Patient presents with   Eye Problem    Donna Woodward is a 66 y.o. female.   Eye Problem    Patient with medical history of hypertension, hyperlipidemia, CABG presents today due to visual changes.  Started acutely yesterday, initially was in the left eye then spread to the right eye.  Described as flashing lights in her peripheral vision.  It was painless, no vision loss or diplopia.  States drink water and the symptoms resolved independently and have not returned since then.  She did try calling her primary told to go to the ER for additional evaluation.  States only similar is happened in the past but sent from the eye.  She wears glasses at baseline.  Home Medications Prior to Admission medications   Medication Sig Start Date End Date Taking? Authorizing Provider  aspirin EC 81 MG tablet Take 1 tablet (81 mg total) by mouth daily. 11/06/17   Caren Macadam, MD  atorvastatin (LIPITOR) 40 MG tablet Take 1 tablet (40 mg total) by mouth daily. 01/06/22   Josue Hector, MD  Calcium Carb-Cholecalciferol (CALCIUM + D3 PO) Take 1 tablet by mouth daily with breakfast.    [provider]  cyclobenzaprine (FLEXERIL) 5 MG tablet TAKE 1 TO 2 TABLETS AT BEDTIME AS NEEDED FOR  MUSCLE  SPASM 02/18/22   Billie Ruddy, MD  lisinopril (ZESTRIL) 40 MG tablet TAKE 1 TABLET EVERY DAY 02/18/22   Billie Ruddy, MD  metoprolol succinate (TOPROL-XL) 100 MG 24 hr tablet TAKE 1 TABLET DAILY. TAKE WITH OR IMMEDIATELY FOLLOWING A MEAL. 01/30/22   Josue Hector, MD  NIFEdipine (PROCARDIA-XL/NIFEDICAL-XL) 30 MG 24 hr tablet TAKE 1 TABLET EVERY DAY 04/14/22   Billie Ruddy, MD  Omega-3 Fatty Acids (FISH OIL) 1000 MG CAPS Take 1,000 mg by mouth in the morning and at bedtime.     [provider]  spironolactone (ALDACTONE) 25 MG tablet TAKE  1 TABLET EVERY DAY 04/14/22   Josue Hector, MD      Allergies    Bee venom    Review of Systems   Review of Systems  Physical Exam Updated Vital Signs BP (!) 159/87   Pulse 64   Temp 98 F (36.7 C) (Oral)   Resp 18   Ht '5\' 3"'$  (1.6 m)   Wt 74 kg   SpO2 95%   BMI 28.90 kg/m  Physical Exam Vitals and nursing note reviewed. Exam conducted with a chaperone present.  Constitutional:      General: She is not in acute distress.    Appearance: Normal appearance.  HENT:     Head: Normocephalic and atraumatic.  Eyes:     General: Lids are normal. Lids are everted, no foreign bodies appreciated. Vision grossly intact. No visual field deficit or scleral icterus.    Extraocular Movements: Extraocular movements intact.     Right eye: No nystagmus.     Left eye: No nystagmus.     Conjunctiva/sclera:     Right eye: Right conjunctiva is not injected. No chemosis, exudate or hemorrhage.    Left eye: Left conjunctiva is not injected. No chemosis, exudate or hemorrhage.    Pupils: Pupils are equal, round, and reactive to light.     Comments: Visual fields intact to confrontation.  EOMI, visual acuity is roughly intact  on my exam.  Skin:    Coloration: Skin is not jaundiced.  Neurological:     Mental Status: She is alert. Mental status is at baseline.     Coordination: Coordination normal.     ED Results / Procedures / Treatments   Labs (all labs ordered are listed, but only abnormal results are displayed) Labs Reviewed - No data to display  EKG None  Radiology No results found.  Procedures Procedures    Medications Ordered in ED Medications - No data to display  ED Course/ Medical Decision Making/ A&P                           Medical Decision Making  Patient presents due to floaters in her peripheral vision bilaterally which is fully resolved at this time.  Physical exam is reassuring, EOMI.  Visual acuity is grossly intact.  Visual fields are also intact.  I  considered additional work-up but patient is asymptomatic, visual acuity is intact and there is no focal deficits.  I discussed the case with my attending, we agree patient is stable for outpatient follow-up with ophthalmology.  Not consistent with uveitis, retinal detachment, ocular ischemia.  Strict return precautions were discussed.        Final Clinical Impression(s) / ED Diagnoses Final diagnoses:  Eye irritation    Rx / DC Orders ED Discharge Orders     None         Sherrill Raring, [place] 05/14/22 1616    Valarie Merino, MD 05/15/22 1545

## 2022-05-19 DIAGNOSIS — H2513 Age-related nuclear cataract, bilateral: Secondary | ICD-10-CM | POA: Diagnosis not present

## 2022-05-19 DIAGNOSIS — G43B0 Ophthalmoplegic migraine, not intractable: Secondary | ICD-10-CM | POA: Diagnosis not present

## 2022-06-04 ENCOUNTER — Ambulatory Visit: Payer: Medicare HMO | Admitting: Podiatry

## 2022-06-04 DIAGNOSIS — M722 Plantar fascial fibromatosis: Secondary | ICD-10-CM

## 2022-06-04 NOTE — Progress Notes (Signed)
Subjective:  Patient ID: Donna Woodward, female    DOB: Nov 10, 1955,  MRN: [id number]  Chief Complaint  Patient presents with   Foot Pain    RIGHT FOOT PAIN  HAS XRAYS IN THE SYSTEM     66 y.o. female presents with the above complaint.  Patient presents with right heel pain that has been going for quite some time is progressive gotten worse worse with ambulation worse with pressure.  She went to get it evaluated.  She states that she was told that she has spur.  Pain scale 7 out of 10 dull aching nature.  She had x-rays done in the system.   Review of Systems: Negative except as noted in the HPI. Denies N/V/F/Ch.  Past Medical History:  Diagnosis Date   Anxiety    Back pain    Cataract    Coronary artery disease    CABGx3; Seen by Dr. Johnsie Cancel   Degenerative joint disease (DJD) of lumbar spine 03/24/2016   Depression    GERD (gastroesophageal reflux disease)    if overweight   Hyperlipidemia    Hypertension     Current Outpatient Medications:    aspirin EC 81 MG tablet, Take 1 tablet (81 mg total) by mouth daily., Disp: , Rfl:    atorvastatin (LIPITOR) 40 MG tablet, Take 1 tablet (40 mg total) by mouth daily., Disp: 90 tablet, Rfl: 3   Calcium Carb-Cholecalciferol (CALCIUM + D3 PO), Take 1 tablet by mouth daily with breakfast., Disp: , Rfl:    cyclobenzaprine (FLEXERIL) 5 MG tablet, TAKE 1 TO 2 TABLETS AT BEDTIME AS NEEDED FOR  MUSCLE  SPASM, Disp: 90 tablet, Rfl: 1   lisinopril (ZESTRIL) 40 MG tablet, TAKE 1 TABLET EVERY DAY, Disp: 90 tablet, Rfl: 3   metoprolol succinate (TOPROL-XL) 100 MG 24 hr tablet, TAKE 1 TABLET DAILY. TAKE WITH OR IMMEDIATELY FOLLOWING A MEAL., Disp: 90 tablet, Rfl: 2   NIFEdipine (PROCARDIA-XL/NIFEDICAL-XL) 30 MG 24 hr tablet, TAKE 1 TABLET EVERY DAY, Disp: 90 tablet, Rfl: 2   Omega-3 Fatty Acids (FISH OIL) 1000 MG CAPS, Take 1,000 mg by mouth in the morning and at bedtime. , Disp: , Rfl:    spironolactone (ALDACTONE) 25 MG tablet, TAKE 1 TABLET EVERY  DAY, Disp: 90 tablet, Rfl: 2  Social History   Tobacco Use  Smoking Status Former   Types: Cigarettes   Quit date: 12/04/2006   Years since quitting: 15.5  Smokeless Tobacco Never    Allergies  Allergen Reactions   Bee Venom Swelling and Other (See Comments)    Swells where stung- no breathing issues   Objective:  There were no vitals filed for this visit. There is no height or weight on file to calculate BMI. Constitutional Well developed. Well nourished.  Vascular Dorsalis pedis pulses palpable bilaterally. Posterior tibial pulses palpable bilaterally. Capillary refill normal to all digits.  No cyanosis or clubbing noted. Pedal hair growth normal.  Neurologic Normal speech. Oriented to person, place, and time. Epicritic sensation to light touch grossly present bilaterally.  Dermatologic Nails well groomed and normal in appearance. No open wounds. No skin lesions.  Orthopedic: Normal joint ROM without pain or crepitus bilaterally. No visible deformities. Tender to palpation at the calcaneal tuber right. No pain with calcaneal squeeze right. Ankle ROM diminished range of motion right. Silfverskiold Test: positive right.   Radiographs: Taken and reviewed. No acute fractures or dislocations. No evidence of stress fracture.  Plantar heel spur present. Posterior heel spur absent.  Assessment:   1. Plantar fasciitis of right foot    Plan:  Patient was evaluated and treated and all questions answered.  Plantar Fasciitis, right - XR reviewed as above.  - Educated on icing and stretching. Instructions given.  - Injection delivered to the plantar fascia as below. - DME: Plantar fascial brace dispensed to support the medial longitudinal arch of the foot and offload pressure from the heel and prevent arch collapse during weightbearing - Pharmacologic management: None  Procedure: Injection Tendon/Ligament Location: Right plantar fascia at the glabrous junction; medial  approach. Skin Prep: alcohol Injectate: 0.5 cc 0.5% marcaine plain, 0.5 cc of 1% Lidocaine, 0.5 cc kenalog 10. Disposition: Patient tolerated procedure well. Injection site dressed with a band-aid.  No follow-ups on file.

## 2022-07-01 ENCOUNTER — Encounter: Payer: Self-pay | Admitting: Family Medicine

## 2022-07-02 ENCOUNTER — Encounter: Payer: Self-pay | Admitting: Cardiovascular Disease

## 2022-07-06 ENCOUNTER — Other Ambulatory Visit: Payer: Self-pay | Admitting: Family Medicine

## 2022-07-09 ENCOUNTER — Ambulatory Visit: Payer: Medicare HMO | Admitting: Podiatry

## 2022-07-09 VITALS — BP 128/68

## 2022-07-09 DIAGNOSIS — M722 Plantar fascial fibromatosis: Secondary | ICD-10-CM | POA: Diagnosis not present

## 2022-07-09 NOTE — Progress Notes (Signed)
Subjective:  Patient ID: Donna Woodward, female    DOB: Jul 31, 1955,  MRN: [id number]  Chief Complaint  Patient presents with   Plantar Fasciitis    Pt stated that she is doing okay     67 y.o. female presents with the above complaint.  Patient presents for follow-up to right Planter fasciitis.  She states the injection helped some.  She still has some residual pain.  She would like to discuss next treatment plan   Review of Systems: Negative except as noted in the HPI. Denies N/V/F/Ch.  Past Medical History:  Diagnosis Date   Anxiety    Back pain    Cataract    Coronary artery disease    CABGx3; Seen by Dr. Johnsie Cancel   Degenerative joint disease (DJD) of lumbar spine 03/24/2016   Depression    GERD (gastroesophageal reflux disease)    if overweight   Hyperlipidemia    Hypertension     Current Outpatient Medications:    aspirin EC 81 MG tablet, Take 1 tablet (81 mg total) by mouth daily., Disp: , Rfl:    atorvastatin (LIPITOR) 40 MG tablet, Take 1 tablet (40 mg total) by mouth daily., Disp: 90 tablet, Rfl: 3   Calcium Carb-Cholecalciferol (CALCIUM + D3 PO), Take 1 tablet by mouth daily with breakfast., Disp: , Rfl:    cyclobenzaprine (FLEXERIL) 5 MG tablet, TAKE 1 TO 2 TABLETS AT BEDTIME AS NEEDED FOR MUSCLE SPASM, Disp: 90 tablet, Rfl: 3   lisinopril (ZESTRIL) 40 MG tablet, TAKE 1 TABLET EVERY DAY, Disp: 90 tablet, Rfl: 3   metoprolol succinate (TOPROL-XL) 100 MG 24 hr tablet, TAKE 1 TABLET DAILY. TAKE WITH OR IMMEDIATELY FOLLOWING A MEAL., Disp: 90 tablet, Rfl: 2   NIFEdipine (PROCARDIA-XL/NIFEDICAL-XL) 30 MG 24 hr tablet, TAKE 1 TABLET EVERY DAY, Disp: 90 tablet, Rfl: 2   Omega-3 Fatty Acids (FISH OIL) 1000 MG CAPS, Take 1,000 mg by mouth in the morning and at bedtime. , Disp: , Rfl:    spironolactone (ALDACTONE) 25 MG tablet, TAKE 1 TABLET EVERY DAY, Disp: 90 tablet, Rfl: 2  Social History   Tobacco Use  Smoking Status Former   Types: Cigarettes   Quit date: 12/04/2006    Years since quitting: 15.6  Smokeless Tobacco Never    Allergies  Allergen Reactions   Bee Venom Swelling and Other (See Comments)    Swells where stung- no breathing issues   Objective:   Vitals:   07/09/22 0934  BP: 128/68   There is no height or weight on file to calculate BMI. Constitutional Well developed. Well nourished.  Vascular Dorsalis pedis pulses palpable bilaterally. Posterior tibial pulses palpable bilaterally. Capillary refill normal to all digits.  No cyanosis or clubbing noted. Pedal hair growth normal.  Neurologic Normal speech. Oriented to person, place, and time. Epicritic sensation to light touch grossly present bilaterally.  Dermatologic Nails well groomed and normal in appearance. No open wounds. No skin lesions.  Orthopedic: Normal joint ROM without pain or crepitus bilaterally. No visible deformities. Tender to palpation at the calcaneal tuber right. No pain with calcaneal squeeze right. Ankle ROM diminished range of motion right. Silfverskiold Test: positive right.   Radiographs: Taken and reviewed. No acute fractures or dislocations. No evidence of stress fracture.  Plantar heel spur present. Posterior heel spur absent.   Assessment:   No diagnosis found.  Plan:  Patient was evaluated and treated and all questions answered.  Plantar Fasciitis, right - XR reviewed as above.  -  Educated on icing and stretching. Instructions given.  -Second injection delivered to the plantar fascia as below. - DME: Continue plan fascial brace - Pharmacologic management: None  Procedure: Injection Tendon/Ligament Location: Right plantar fascia at the glabrous junction; medial approach. Skin Prep: alcohol Injectate: 0.5 cc 0.5% marcaine plain, 0.5 cc of 1% Lidocaine, 0.5 cc kenalog 10. Disposition: Patient tolerated procedure well. Injection site dressed with a band-aid.  No follow-ups on file.

## 2022-10-06 ENCOUNTER — Ambulatory Visit (INDEPENDENT_AMBULATORY_CARE_PROVIDER_SITE_OTHER): Payer: Medicare HMO | Admitting: Family Medicine

## 2022-10-06 ENCOUNTER — Encounter: Payer: Self-pay | Admitting: Family Medicine

## 2022-10-06 VITALS — BP 110/78 | HR 70 | Temp 97.8°F | Ht 64.0 in | Wt 150.8 lb

## 2022-10-06 DIAGNOSIS — R252 Cramp and spasm: Secondary | ICD-10-CM

## 2022-10-06 DIAGNOSIS — D3709 Neoplasm of uncertain behavior of other specified sites of the oral cavity: Secondary | ICD-10-CM

## 2022-10-06 DIAGNOSIS — E785 Hyperlipidemia, unspecified: Secondary | ICD-10-CM | POA: Diagnosis not present

## 2022-10-06 DIAGNOSIS — M7022 Olecranon bursitis, left elbow: Secondary | ICD-10-CM | POA: Diagnosis not present

## 2022-10-06 DIAGNOSIS — Z0001 Encounter for general adult medical examination with abnormal findings: Secondary | ICD-10-CM

## 2022-10-06 DIAGNOSIS — I10 Essential (primary) hypertension: Secondary | ICD-10-CM

## 2022-10-06 DIAGNOSIS — R7303 Prediabetes: Secondary | ICD-10-CM | POA: Diagnosis not present

## 2022-10-06 DIAGNOSIS — R634 Abnormal weight loss: Secondary | ICD-10-CM | POA: Diagnosis not present

## 2022-10-06 DIAGNOSIS — M20009 Unspecified deformity of unspecified finger(s): Secondary | ICD-10-CM | POA: Diagnosis not present

## 2022-10-06 DIAGNOSIS — H269 Unspecified cataract: Secondary | ICD-10-CM

## 2022-10-06 DIAGNOSIS — M238X1 Other internal derangements of right knee: Secondary | ICD-10-CM

## 2022-10-06 DIAGNOSIS — R059 Cough, unspecified: Secondary | ICD-10-CM | POA: Diagnosis not present

## 2022-10-06 DIAGNOSIS — Z1231 Encounter for screening mammogram for malignant neoplasm of breast: Secondary | ICD-10-CM

## 2022-10-06 DIAGNOSIS — M2351 Chronic instability of knee, right knee: Secondary | ICD-10-CM

## 2022-10-06 DIAGNOSIS — R29898 Other symptoms and signs involving the musculoskeletal system: Secondary | ICD-10-CM

## 2022-10-06 LAB — CBC WITH DIFFERENTIAL/PLATELET
Basophils Absolute: 0.1 10*3/uL (ref 0.0–0.1)
Basophils Relative: 0.5 % (ref 0.0–3.0)
Eosinophils Absolute: 0.3 10*3/uL (ref 0.0–0.7)
Eosinophils Relative: 2.1 % (ref 0.0–5.0)
HCT: 42.6 % (ref 36.0–46.0)
Hemoglobin: 14.2 g/dL (ref 12.0–15.0)
Lymphocytes Relative: 20.3 % (ref 12.0–46.0)
Lymphs Abs: 2.5 10*3/uL (ref 0.7–4.0)
MCHC: 33.3 g/dL (ref 30.0–36.0)
MCV: 88.8 fl (ref 78.0–100.0)
Monocytes Absolute: 0.9 10*3/uL (ref 0.1–1.0)
Monocytes Relative: 7.2 % (ref 3.0–12.0)
Neutro Abs: 8.7 10*3/uL — ABNORMAL HIGH (ref 1.4–7.7)
Neutrophils Relative %: 69.9 % (ref 43.0–77.0)
Platelets: 260 10*3/uL (ref 150.0–400.0)
RBC: 4.79 Mil/uL (ref 3.87–5.11)
RDW: 15.2 % (ref 11.5–15.5)
WBC: 12.4 10*3/uL — ABNORMAL HIGH (ref 4.0–10.5)

## 2022-10-06 LAB — VITAMIN D 25 HYDROXY (VIT D DEFICIENCY, FRACTURES): VITD: 44.64 ng/mL (ref 30.00–100.00)

## 2022-10-06 LAB — LIPID PANEL
Cholesterol: 125 mg/dL (ref 0–200)
HDL: 27.9 mg/dL — ABNORMAL LOW (ref >39.00)
LDL Cholesterol: 74 mg/dL (ref 0–99)
NonHDL: 97.44
Total CHOL/HDL Ratio: 4
Triglycerides: 116 mg/dL (ref 0.0–149.0)
VLDL: 23.2 mg/dL (ref 0.0–40.0)

## 2022-10-06 LAB — T4, FREE: Free T4: 0.82 ng/dL (ref 0.60–1.60)

## 2022-10-06 LAB — COMPREHENSIVE METABOLIC PANEL
ALT: 21 U/L (ref 0–35)
AST: 18 U/L (ref 0–37)
Albumin: 4.6 g/dL (ref 3.5–5.2)
Alkaline Phosphatase: 50 U/L (ref 39–117)
BUN: 49 mg/dL — ABNORMAL HIGH (ref 6–23)
CO2: 26 mEq/L (ref 19–32)
Calcium: 9.8 mg/dL (ref 8.4–10.5)
Chloride: 101 mEq/L (ref 96–112)
Creatinine, Ser: 1.25 mg/dL — ABNORMAL HIGH (ref 0.40–1.20)
GFR: 44.85 mL/min — ABNORMAL LOW (ref >60.00)
Glucose, Bld: 111 mg/dL — ABNORMAL HIGH (ref 70–99)
Potassium: 4.8 mEq/L (ref 3.5–5.1)
Sodium: 135 mEq/L (ref 135–145)
Total Bilirubin: 0.6 mg/dL (ref 0.2–1.2)
Total Protein: 7.2 g/dL (ref 6.0–8.3)

## 2022-10-06 LAB — HEMOGLOBIN A1C: Hgb A1c MFr Bld: 6 % (ref 4.6–6.5)

## 2022-10-06 LAB — TSH: TSH: 2.28 u[IU]/mL (ref 0.35–5.50)

## 2022-10-06 LAB — VITAMIN B12: Vitamin B-12: 272 pg/mL (ref 211–911)

## 2022-10-06 NOTE — Progress Notes (Signed)
Established Patient Office Visit   Subjective  Patient ID: Donna Woodward, female    DOB: June 28, 1956  Age: 67 y.o. MRN: [id number]  Chief Complaint  Patient presents with   Annual Exam    Patient is a 67 year old female seen for CPE.  Patient states most part she is doing well but notes several concerns.  Patient with cramping in fingers of left hand.  Fingers may get stuck in partial flexion at MCP joint.  Patient denies triggering.  At times has pain/discomfort from left elbow up to left shoulder and into left hand.  Patient notes history of olecranon bursitis.  Patient also notes deformity of left fifth DIP joint.  Endorses decreased grip strength bilaterally.  Denies family history of OA or RA.  Patient with follow-up in right knee that at times moves.  Not painful, no edema, increased warmth, or erythema.  Patient has noticed occasional instability of right knee.  Ambulating with cane.  Patient with growth on uvula, present x 20 or more years but seems to have increased in size.  Area is not painful, does not bleed, or get irritated but is starting to touch the back of patient's tongue which is annoying.  Patient inquires about which foods she can eat.  States has made various changes given history of prediabetes.  Canning vegetables and fruits at home as well as making breads with whole grains and different flour.  Using almond milk.  Notes change in appetite.  Instead of eating 2 eggs may have 1.      ROS Negative unless stated above    Objective:     BP 110/78 (BP Location: Left Arm, Patient Position: Sitting, Cuff Size: Normal)   Pulse 70   Temp 97.8 F (36.6 C) (Oral)   Ht 5\' 4"  (1.626 m)   Wt 150 lb 12.8 oz (68.4 kg)   SpO2 97%   BMI 25.88 kg/m    Physical Exam Constitutional:      Appearance: Normal appearance.  HENT:     Head: Normocephalic and atraumatic.     Right Ear: Tympanic membrane, ear canal and external ear normal.     Left Ear: Tympanic membrane, ear  canal and external ear normal.     Nose: Nose normal.     Mouth/Throat:     Mouth: Mucous membranes are moist.     Pharynx: No oropharyngeal exudate or posterior oropharyngeal erythema.      Comments: Polyp-like attachment, right uvula.  Whitish round lesion on left pharynx Eyes:     General: No scleral icterus.    Extraocular Movements: Extraocular movements intact.     Conjunctiva/sclera: Conjunctivae normal.     Pupils: Pupils are equal, round, and reactive to light.  Neck:     Thyroid: No thyromegaly.  Cardiovascular:     Rate and Rhythm: Normal rate and regular rhythm.     Pulses: Normal pulses.     Heart sounds: Normal heart sounds. No murmur heard.    No friction rub.  Pulmonary:     Effort: Pulmonary effort is normal.     Breath sounds: Normal breath sounds. No wheezing, rhonchi or rales.  Abdominal:     General: Bowel sounds are normal.     Palpations: Abdomen is soft.     Tenderness: There is no abdominal tenderness.  Musculoskeletal:        General: Normal range of motion.     Right hand: Decreased strength of finger abduction.  Left hand: Deformity present. Decreased strength of finger abduction.       Legs:     Comments: Mobile round, cystic lesion in medial patellar tendon right knee.  No effusion, crepitus, erythema, or edema in bilateral knees.  Left fifth digit DIP joint angulated medially.  Dorsum of right lateral thumb with firm nodule.  Left posterior elbow mild effusion, no erythema, induration, increased warmth.  Lymphadenopathy:     Cervical: No cervical adenopathy.  Skin:    General: Skin is warm and dry.     Findings: No lesion.  Neurological:     General: No focal deficit present.     Mental Status: She is alert and oriented to person, place, and time.     Comments: Ambulating with cane.  Psychiatric:        Mood and Affect: Mood normal.        Thought Content: Thought content normal.      No results found for any visits on 10/06/22.     Assessment & Plan:  Encounter for well adult exam with abnormal findings -Anticipatory guidance given including wearing seatbelts, smoke detectors in the home, increasing physical activity, increasing p.o. intake of water and vegetables. -Labs -Mammogram ordered.  Last done 06/18/2020 -Immunizations reviewed -Colonoscopy done 04/10/2019 -Given handouts -Next CPE in 1 year -Pt to schedule AWV  Prediabetes -Hemoglobin A1c 6.2% on 10/03/2021 -     Lipid panel -     Hemoglobin A1c  Essential hypertension -controlled -Continue current medications -Continue lifestyle modifications -     CBC with Differential/Platelet -     Comprehensive metabolic panel -     Lipid panel -     TSH -     T4, free  Hyperlipidemia, unspecified hyperlipidemia type -Continue lifestyle modifications -Total cholesterol 132, HDL 25, LDL 79, triglycerides 162 on 04/01/2022 -Continue follow-up with cardiology -Continue atorvastatin 40 mg daily -     Comprehensive metabolic panel -     Lipid panel  Encounter for screening mammogram for malignant neoplasm of breast -     Digital Screening Mammogram, Left and Right; Future  Olecranon bursitis of left elbow -     CBC with Differential/Platelet  Neoplasm of uncertain behavior of uvula -Polyp -     Ambulatory referral to ENT  Cataract of both eyes, unspecified -Patient to follow-up with ophthalmology  Cough, unspecified type -Discussed possible causes including allergies, GERD -Try OTC antihistamine.  For continued symptoms notify clinic for CXR.  Hand cramp -Discussed possible causes including electrolyte abnormalities, arthritis, nerve compression -     DG Hand Complete Left; Future -     Vitamin B12 -     Comprehensive metabolic panel -     CBC with differential/platelet  Finger deformity -Likely 2/2 arthritis. -Obtain imaging to determine OA versus RA. -     DG Hand Complete Left; Future -     VITAMIN D 25 Hydroxy (Vit-D Deficiency,  Fractures)  Decreased grip strength -Possibly 2/2 arthritis versus nerve compression -     DG Hand Complete Left; Future  Intraligamentous cyst of knee, right -     DG Knee Complete 4 Views Right; Future  Recurrent right knee instability -     DG Knee Complete 4 Views Right; Future  Weight loss -Unintentional for making lifestyle modifications -Patient to monitor weight at home and office visits. -For continued symptoms obtain additional labs/imaging -     Vitamin B12 -     VITAMIN D 25  Hydroxy (Vit-D Deficiency, Fractures)    Return in about 6 weeks (around 11/17/2022), or if symptoms worsen or fail to improve.   Deeann SaintShannon R Donelle Baba, MD

## 2022-10-06 NOTE — Patient Instructions (Signed)
A referral was placed for the ENT, you should expect a phone call about scheduling this appt.    Our xray tech is not in today.  If you want to have the xray of your right knee and left hand done today please let us know if pt have this done at one of the other offices such as [facility] office, or at the [facility].  We just need to change the location for the order.

## 2022-10-07 ENCOUNTER — Other Ambulatory Visit: Payer: Medicare HMO

## 2022-10-07 ENCOUNTER — Ambulatory Visit (INDEPENDENT_AMBULATORY_CARE_PROVIDER_SITE_OTHER): Payer: Medicare HMO

## 2022-10-07 DIAGNOSIS — R29898 Other symptoms and signs involving the musculoskeletal system: Secondary | ICD-10-CM | POA: Diagnosis not present

## 2022-10-07 DIAGNOSIS — M2351 Chronic instability of knee, right knee: Secondary | ICD-10-CM

## 2022-10-07 DIAGNOSIS — R252 Cramp and spasm: Secondary | ICD-10-CM | POA: Diagnosis not present

## 2022-10-07 DIAGNOSIS — M238X1 Other internal derangements of right knee: Secondary | ICD-10-CM | POA: Diagnosis not present

## 2022-10-07 DIAGNOSIS — M20009 Unspecified deformity of unspecified finger(s): Secondary | ICD-10-CM | POA: Diagnosis not present

## 2022-10-07 DIAGNOSIS — M25561 Pain in right knee: Secondary | ICD-10-CM | POA: Diagnosis not present

## 2022-10-07 DIAGNOSIS — M19042 Primary osteoarthritis, left hand: Secondary | ICD-10-CM | POA: Diagnosis not present

## 2022-10-15 ENCOUNTER — Other Ambulatory Visit: Payer: Self-pay | Admitting: Family Medicine

## 2022-10-15 DIAGNOSIS — D72820 Lymphocytosis (symptomatic): Secondary | ICD-10-CM

## 2022-10-15 DIAGNOSIS — N179 Acute kidney failure, unspecified: Secondary | ICD-10-CM

## 2022-10-17 ENCOUNTER — Encounter: Payer: Self-pay | Admitting: Family Medicine

## 2022-10-20 ENCOUNTER — Ambulatory Visit: Payer: Medicare HMO

## 2022-10-24 ENCOUNTER — Telehealth: Payer: Self-pay | Admitting: Family Medicine

## 2022-10-24 NOTE — Telephone Encounter (Signed)
Error  I called patient to schedule

## 2022-10-24 NOTE — Telephone Encounter (Signed)
   Our records show that you are due for your Medicare Annual Wellness Visit with our Health Coach.  This is a benefit that Medicare provides to you once a year at no cost to you.   During your visit, you and your health coach, will develop or update a personalized prevention help plan. This plan is designed to help prevent disease and disability based on your current health and risk factors. We will develop a personalized prevention plan to help you stay healthy.  It will also include:   A review of your medical and family history  Developing or updating a list of current providers and prescriptions  Height, weight, blood pressure, and other routine measurements  Detection of any cognitive impairment  Personalized health advice  Advance Care Planning  If you would like, please respond back by MyChart to coordinate your appointment or please call me directly at [phone].  Thank you,  Carrie Mew Care Guide  Embedded Care Coordination [facility]  Care Management ??[phone]

## 2022-10-24 NOTE — Telephone Encounter (Signed)
Contacted Donna Woodward to schedule their annual wellness visit. Appointment made for 11/07/22.  Zella Ball [facility] AWV direct phone # [phone]

## 2022-10-27 ENCOUNTER — Other Ambulatory Visit: Payer: Self-pay | Admitting: Cardiovascular Disease

## 2022-10-29 ENCOUNTER — Ambulatory Visit: Payer: Self-pay

## 2022-10-29 NOTE — Patient Outreach (Signed)
  Care Coordination   Initial Visit Note   10/29/2022 Name: Donna [facility] MRN: [id number] DOB: 01-21-1956  Donna [facility] is a 67 y.o. year old female who sees Deeann Saint, MD for primary care. I spoke with  Donna [facility] by phone today.  What matters to the patients health and wellness today?  Patient is interested in learning more about vaccine schedules    Goals Addressed             This Visit's Progress    COMPLETED: Care Coordination Activities       Care Coordination Interventions: SDoH screening performed - no acute resource challenges identified at this time Determined the patient does not have concerns with medication costs and/or adherence Performed chart review to note the patient is scheduled to see her primary care provider 5/20 at 10am - patient is aware of this appointment and does not need assistance with transportation Discussed patient would like guidance on when to obtain the flu shot, COVID booster, shingles vaccine, and pneumonia vaccine Advised the patient to speak with her primary care provider during upcomming office visit regarding vaccine schedule Education provided on the role of the care coordination team - no follow up desired at this time Encouraged the patient to contact her primary care provider as needed        SDOH assessments and interventions completed:  Yes  SDOH Interventions Today    Flowsheet Row Most Recent Value  SDOH Interventions   Food Insecurity Interventions Intervention Not Indicated  Housing Interventions Intervention Not Indicated  Transportation Interventions Intervention Not Indicated        Care Coordination Interventions:  Yes, provided   Interventions Today    Flowsheet Row Most Recent Value  Chronic Disease   Chronic disease during today's visit Hypertension (HTN)  General Interventions   General Interventions Discussed/Reviewed Vaccines  Vaccines COVID-19, Flu, Pneumonia, RSV  Doctor Visits  Discussed/Reviewed Doctor Visits Reviewed  [upcomming appointment 5/20]        Follow up plan: No further intervention required.   Encounter Outcome:  Pt. Visit Completed   Bevelyn Ngo, BSW, CDP Social Worker, Certified Dementia Practitioner [facility] Care Management  Care Coordination [phone]

## 2022-10-29 NOTE — Patient Instructions (Signed)
Visit Information  Thank you for taking time to visit with me today. Please don't hesitate to contact me if I can be of assistance to you.   Following are the goals we discussed today:  -Speak with your provider during your upcomming appointment regarding vaccine schedules -Contact the care coordination team as needed   If you are experiencing a Mental Health or Behavioral Health Crisis or need someone to talk to, please go to [facility] [address] [place] [phone])  Patient verbalizes understanding of instructions and care plan provided today and agrees to view in MyChart. Active MyChart status and patient understanding of how to access instructions and care plan via MyChart confirmed with patient.     Bevelyn Ngo, BSW, CDP Social Worker, Certified Dementia Practitioner [facility] Care Management  Care Coordination [phone]

## 2022-10-31 ENCOUNTER — Other Ambulatory Visit: Payer: Self-pay

## 2022-10-31 ENCOUNTER — Encounter (HOSPITAL_COMMUNITY): Payer: Self-pay

## 2022-10-31 ENCOUNTER — Encounter: Payer: Self-pay | Admitting: Family Medicine

## 2022-10-31 ENCOUNTER — Ambulatory Visit (INDEPENDENT_AMBULATORY_CARE_PROVIDER_SITE_OTHER): Payer: Medicare HMO | Admitting: Family Medicine

## 2022-10-31 ENCOUNTER — Emergency Department (HOSPITAL_COMMUNITY): Payer: Medicare HMO

## 2022-10-31 ENCOUNTER — Encounter: Payer: Self-pay | Admitting: Gastroenterology

## 2022-10-31 ENCOUNTER — Observation Stay (HOSPITAL_COMMUNITY)
Admission: EM | Admit: 2022-10-31 | Discharge: 2022-11-01 | Disposition: A | Discharge status: Home / Self Care | Payer: Medicare HMO | Attending: Internal Medicine | Admitting: Internal Medicine

## 2022-10-31 VITALS — BP 96/50 | HR 70 | Temp 97.6°F | Ht 64.0 in | Wt 148.6 lb

## 2022-10-31 DIAGNOSIS — I129 Hypertensive chronic kidney disease with stage 1 through stage 4 chronic kidney disease, or unspecified chronic kidney disease: Secondary | ICD-10-CM | POA: Diagnosis not present

## 2022-10-31 DIAGNOSIS — K529 Noninfective gastroenteritis and colitis, unspecified: Secondary | ICD-10-CM | POA: Diagnosis present

## 2022-10-31 DIAGNOSIS — K625 Hemorrhage of anus and rectum: Secondary | ICD-10-CM

## 2022-10-31 DIAGNOSIS — N1831 Chronic kidney disease, stage 3a: Secondary | ICD-10-CM | POA: Insufficient documentation

## 2022-10-31 DIAGNOSIS — N179 Acute kidney failure, unspecified: Secondary | ICD-10-CM | POA: Insufficient documentation

## 2022-10-31 DIAGNOSIS — Z87891 Personal history of nicotine dependence: Secondary | ICD-10-CM | POA: Diagnosis not present

## 2022-10-31 DIAGNOSIS — K922 Gastrointestinal hemorrhage, unspecified: Secondary | ICD-10-CM

## 2022-10-31 DIAGNOSIS — E860 Dehydration: Secondary | ICD-10-CM | POA: Diagnosis not present

## 2022-10-31 DIAGNOSIS — E785 Hyperlipidemia, unspecified: Secondary | ICD-10-CM | POA: Diagnosis present

## 2022-10-31 DIAGNOSIS — K51911 Ulcerative colitis, unspecified with rectal bleeding: Secondary | ICD-10-CM | POA: Diagnosis not present

## 2022-10-31 DIAGNOSIS — I2510 Atherosclerotic heart disease of native coronary artery without angina pectoris: Secondary | ICD-10-CM | POA: Diagnosis not present

## 2022-10-31 DIAGNOSIS — Z79899 Other long term (current) drug therapy: Secondary | ICD-10-CM | POA: Diagnosis not present

## 2022-10-31 DIAGNOSIS — R109 Unspecified abdominal pain: Secondary | ICD-10-CM | POA: Diagnosis present

## 2022-10-31 DIAGNOSIS — Z951 Presence of aortocoronary bypass graft: Secondary | ICD-10-CM | POA: Diagnosis not present

## 2022-10-31 DIAGNOSIS — Z7982 Long term (current) use of aspirin: Secondary | ICD-10-CM | POA: Insufficient documentation

## 2022-10-31 DIAGNOSIS — I714 Abdominal aortic aneurysm, without rupture, unspecified: Secondary | ICD-10-CM | POA: Insufficient documentation

## 2022-10-31 DIAGNOSIS — E861 Hypovolemia: Secondary | ICD-10-CM | POA: Diagnosis not present

## 2022-10-31 DIAGNOSIS — N189 Chronic kidney disease, unspecified: Secondary | ICD-10-CM | POA: Diagnosis present

## 2022-10-31 DIAGNOSIS — N281 Cyst of kidney, acquired: Secondary | ICD-10-CM | POA: Diagnosis not present

## 2022-10-31 DIAGNOSIS — I10 Essential (primary) hypertension: Secondary | ICD-10-CM | POA: Diagnosis present

## 2022-10-31 LAB — CBC
HCT: 41.1 % (ref 36.0–46.0)
Hemoglobin: 13.5 g/dL (ref 12.0–15.0)
MCH: 29.9 pg (ref 26.0–34.0)
MCHC: 32.8 g/dL (ref 30.0–36.0)
MCV: 91.1 fL (ref 80.0–100.0)
Platelets: 282 10*3/uL (ref 150–400)
RBC: 4.51 MIL/uL (ref 3.87–5.11)
RDW: 14.6 % (ref 11.5–15.5)
WBC: 14.4 10*3/uL — ABNORMAL HIGH (ref 4.0–10.5)
nRBC: 0 % (ref 0.0–0.2)

## 2022-10-31 LAB — COMPREHENSIVE METABOLIC PANEL
ALT: 19 U/L (ref 0–44)
AST: 18 U/L (ref 15–41)
Albumin: 3.9 g/dL (ref 3.5–5.0)
Alkaline Phosphatase: 45 U/L (ref 38–126)
Anion gap: 13 (ref 5–15)
BUN: 53 mg/dL — ABNORMAL HIGH (ref 8–23)
CO2: 25 mmol/L (ref 22–32)
Calcium: 9.7 mg/dL (ref 8.9–10.3)
Chloride: 98 mmol/L (ref 98–111)
Creatinine, Ser: 1.54 mg/dL — ABNORMAL HIGH (ref 0.44–1.00)
GFR, Estimated: 37 mL/min — ABNORMAL LOW (ref >60)
Glucose, Bld: 127 mg/dL — ABNORMAL HIGH (ref 70–99)
Potassium: 4.6 mmol/L (ref 3.5–5.1)
Sodium: 136 mmol/L (ref 135–145)
Total Bilirubin: 0.7 mg/dL (ref 0.3–1.2)
Total Protein: 6.9 g/dL (ref 6.5–8.1)

## 2022-10-31 LAB — TYPE AND SCREEN
ABO/RH(D): O POS
Antibody Screen: NEGATIVE

## 2022-10-31 LAB — PROTIME-INR
INR: 1.1 (ref 0.8–1.2)
Prothrombin Time: 13.7 seconds (ref 11.4–15.2)

## 2022-10-31 MED ORDER — METOPROLOL SUCCINATE ER 100 MG PO TB24
100.0000 mg | ORAL_TABLET | Freq: Every day | ORAL | Status: DC
  Administered 2022-11-01: 100 mg via ORAL
  Filled 2022-10-31: qty 1

## 2022-10-31 MED ORDER — IOHEXOL 350 MG/ML SOLN
60.0000 mL | Freq: Once | INTRAVENOUS | Status: AC | PRN
  Administered 2022-10-31: 60 mL via INTRAVENOUS

## 2022-10-31 MED ORDER — ONDANSETRON HCL 4 MG PO TABS: 4.0000 mg | ORAL_TABLET | Freq: Four times a day (QID) | ORAL | Status: DC | PRN

## 2022-10-31 MED ORDER — HYDRALAZINE HCL 20 MG/ML IJ SOLN: 5.0000 mg | INTRAMUSCULAR | Status: DC | PRN

## 2022-10-31 MED ORDER — ACETAMINOPHEN 650 MG RE SUPP: 650.0000 mg | Freq: Four times a day (QID) | RECTAL | Status: DC | PRN

## 2022-10-31 MED ORDER — ONDANSETRON HCL 4 MG/2ML IJ SOLN: 4.0000 mg | Freq: Four times a day (QID) | INTRAMUSCULAR | Status: DC | PRN

## 2022-10-31 MED ORDER — LACTATED RINGERS IV BOLUS
1000.0000 mL | Freq: Once | INTRAVENOUS | Status: AC
  Administered 2022-10-31: 1000 mL via INTRAVENOUS

## 2022-10-31 MED ORDER — PANTOPRAZOLE 80MG IVPB - SIMPLE MED
80.0000 mg | Freq: Once | INTRAVENOUS | Status: AC
  Administered 2022-10-31: 80 mg via INTRAVENOUS
  Filled 2022-10-31: qty 100

## 2022-10-31 MED ORDER — ACETAMINOPHEN 325 MG PO TABS: 650.0000 mg | ORAL_TABLET | Freq: Four times a day (QID) | ORAL | Status: DC | PRN

## 2022-10-31 MED ORDER — LACTATED RINGERS IV SOLN: INTRAVENOUS | Status: DC

## 2022-10-31 MED ORDER — NIFEDIPINE ER OSMOTIC RELEASE 30 MG PO TB24
30.0000 mg | ORAL_TABLET | Freq: Every day | ORAL | Status: DC
  Administered 2022-11-01: 30 mg via ORAL
  Filled 2022-10-31: qty 1

## 2022-10-31 MED ORDER — METRONIDAZOLE 500 MG/100ML IV SOLN
500.0000 mg | Freq: Two times a day (BID) | INTRAVENOUS | Status: DC
  Administered 2022-10-31 – 2022-11-01 (×2): 500 mg via INTRAVENOUS
  Filled 2022-10-31 (×2): qty 100

## 2022-10-31 MED ORDER — ATORVASTATIN CALCIUM 40 MG PO TABS
40.0000 mg | ORAL_TABLET | Freq: Every day | ORAL | Status: DC
  Administered 2022-11-01: 40 mg via ORAL
  Filled 2022-10-31: qty 1

## 2022-10-31 MED ORDER — CIPROFLOXACIN IN D5W 400 MG/200ML IV SOLN
400.0000 mg | Freq: Once | INTRAVENOUS | Status: AC
  Administered 2022-10-31: 400 mg via INTRAVENOUS
  Filled 2022-10-31: qty 200

## 2022-10-31 MED ORDER — CIPROFLOXACIN IN D5W 400 MG/200ML IV SOLN
400.0000 mg | Freq: Two times a day (BID) | INTRAVENOUS | Status: DC
  Administered 2022-11-01: 400 mg via INTRAVENOUS
  Filled 2022-10-31 (×2): qty 200

## 2022-10-31 NOTE — ED Triage Notes (Addendum)
Pt arrived POV after PCP sent her here for GI bleed. Pt reports no BMs d/t food poisoning and that pt is experiencing intermittent lower abd cramps and then dark red blood w/ some clots comes out of her rectum. No prior hx of BI bleed. Pt in NAD at this time. Pt w/ hx of hemorrhoids.

## 2022-10-31 NOTE — H&P (Signed)
History and Physical    PatientHAYDN COOGAN [facility]:[id number] DOB: 03/11/1956 DOA: 10/31/2022 DOS: the patient was seen and examined on 10/31/2022 PCP: Donna Saint, MD  Patient coming from: Home - lives alone; NOK: [facility], [phone]    Chief Complaint: Rectal bleeding  HPI: Donna [facility] is a 67 y.o. female with medical history significant of CAD, HTN, and HLD presenting with rectal bleeding.   She reports that she got food poisoning. She had oatmeal with hot almond milk for breakfast yesterday. No problem. No lunch, she had ground [place] with sour cream, tomatoes and salsa. Within a couple of hours, she was crampy and in the bathroom x 4 hours. She had n/v/d lasting 4 hours. After that subsided, she was having lower abdominal cramps and bloody diarrhea. Initially about every 30 minutes and then hourly and then every few hours. She was up and down all night. Last stool was about 0930 this AM and feeling better since. She is feeling hungry, not light-headed or dizzy.     ER Course:  Dehydration, rectal bleeding, likely colitis.  No prior h/o, not on AC.  Symptoms started yesterday with 10 episodes of diarrhea, 5 episodes of nonbloody diarrhea.  BUN 50, creatinine 1.5.  CT with colitis.       Review of Systems: As mentioned in the history of present illness. All other systems reviewed and are negative. Past Medical History:  Diagnosis Date   Anxiety    Back pain    Cataract    Coronary artery disease    CABGx3; Seen by Dr. Eden [facility]   Degenerative joint disease (DJD) of lumbar spine 03/24/2016   Depression    GERD (gastroesophageal reflux disease)    if overweight   Hyperlipidemia    Hypertension    Past Surgical History:  Procedure Laterality Date   BACK SURGERY     CARDIAC CATHETERIZATION N/A 06/05/2016   Procedure: Left Heart Cath and Coronary Angiography;  Surgeon: Donna Records, MD;  [facility];  Service: Cardiovascular;   Laterality: N/A;   CORONARY ARTERY BYPASS GRAFT N/A 06/17/2016   Procedure: CORONARY ARTERY BYPASS GRAFTING (CABG), ON PUMP, TIMES THREE, USING LEFT INTERNAL MAMMARY ARTERY, RIGHT GREATER SAPHENOUS VEIN HARVESTED ENDOSCOPICALLY;  Surgeon: Donna Perna, MD;  Location: [facility];  Service: Open Heart Surgery;  Laterality: N/A;   MULTIPLE TOOTH EXTRACTIONS     SPINE SURGERY  1990   lamnectomy L5S1   TEE WITHOUT CARDIOVERSION N/A 06/17/2016   Procedure: TRANSESOPHAGEAL ECHOCARDIOGRAM (TEE);  Surgeon: Donna Perna, MD;  Location: [facility];  Service: Open Heart Surgery;  Laterality: N/A;   Social History:  reports that she quit smoking about 15 years ago. Her smoking use included cigarettes. She has never used smokeless tobacco. She reports current alcohol use. She reports current drug use. Drug: Marijuana.  Allergies  Allergen Reactions   Bee Venom Swelling and Other (See Comments)    Swells where stung- no breathing issues    Family History  Problem Relation Age of Onset   Hypertension Mother    Stroke Father    Heart disease Father    Dementia Father    Hypertension Sister    CAD Sister 11       cabg   Rectal cancer Sister    Hypertension Sister    Cancer Sister 50       rectal   Hypertension Sister    Stomach cancer Neg Hx  Colon polyps Neg Hx    Colon cancer Neg Hx     Prior to Admission medications   Medication Sig Start Date End Date Taking? Authorizing Provider  aspirin EC 81 MG tablet Take 1 tablet (81 mg total) by mouth daily. 11/06/17   Donna Beams, MD  atorvastatin (LIPITOR) 40 MG tablet Take 1 tablet (40 mg total) by mouth daily. 01/06/22   Donna Stade, MD  Calcium Carb-Cholecalciferol (CALCIUM + D3 PO) Take 1 tablet by mouth daily with breakfast.    [provider]  cyclobenzaprine (FLEXERIL) 5 MG tablet TAKE 1 TO 2 TABLETS AT BEDTIME AS NEEDED FOR MUSCLE SPASM 07/07/22   Donna Saint, MD  lisinopril (ZESTRIL) 40 MG tablet TAKE 1 TABLET EVERY DAY  02/18/22   Donna Saint, MD  metoprolol succinate (TOPROL-XL) 100 MG 24 hr tablet TAKE 1 TABLET DAILY. TAKE WITH OR IMMEDIATELY FOLLOWING A MEAL. 01/30/22   Donna Stade, MD  NIFEdipine (PROCARDIA-XL/NIFEDICAL-XL) 30 MG 24 hr tablet TAKE 1 TABLET EVERY DAY 04/14/22   Donna Saint, MD  Omega-3 Fatty Acids (FISH OIL) 1000 MG CAPS Take 1,000 mg by mouth in the morning and at bedtime.     [provider]  spironolactone (ALDACTONE) 25 MG tablet TAKE 1 TABLET EVERY DAY 04/14/22   Donna Stade, MD    Physical Exam: Vitals:   10/31/22 1530 10/31/22 1535 10/31/22 1645 10/31/22 1735  BP: 117/74  121/69 (!) 146/83  Pulse: (!) 57  65 67  Resp: 17  18 17   Temp:  97.6 F (36.4 C)  (!) 97.5 F (36.4 C)  TempSrc:  Oral  Oral  SpO2: 99%  96% 98%  Weight:      Height:       General:  Appears calm and comfortable and is in NAD Eyes:  PERRL, EOMI, normal lids, iris ENT:  grossly normal hearing, lips & tongue, mmm; appropriate dentition Neck:  no LAD, masses or thyromegaly Cardiovascular:  RRR, no m/r/g. No LE edema.  Respiratory:   CTA bilaterally with no wheezes/rales/rhonchi.  Normal respiratory effort. Abdomen:  soft, NT, ND, NABS Skin:  no rash or induration seen on limited exam Musculoskeletal:  grossly normal tone BUE/BLE, good ROM, no bony abnormality Psychiatric:  grossly normal mood and affect, speech fluent and appropriate, AOx3 Neurologic:  CN 2-12 grossly intact, moves all extremities in coordinated fashion   Radiological Exams on Admission: Independently reviewed - see discussion in A/P where applicable  CT ABDOMEN PELVIS W CONTRAST  Result Date: 10/31/2022 CLINICAL DATA:  Lower GI bleeding.  Crampy abdominal pain. EXAM: CT ABDOMEN AND PELVIS WITH CONTRAST TECHNIQUE: Multidetector CT imaging of the abdomen and pelvis was performed using the standard protocol following bolus administration of intravenous contrast. RADIATION DOSE REDUCTION: This exam was performed  according to the departmental dose-optimization program which includes automated exposure control, adjustment of the mA and/or kV according to patient size and/or use of iterative reconstruction technique. CONTRAST:  60mL OMNIPAQUE IOHEXOL 350 MG/ML SOLN COMPARISON:  None Available. FINDINGS: Lower chest: Clear lung bases. Hepatobiliary: Normal liver. Small densities in the gallbladder suspected to be small stones. No gallbladder wall thickening or adjacent inflammation. Common bile duct dilated to 8 mm. No visualized duct stone. Pancreas: Unremarkable. No pancreatic ductal dilatation or surrounding inflammatory changes. Spleen: Normal in size without focal abnormality. Adrenals/Urinary Tract: 1.3 cm left adrenal nodule and 1.1 cm right adrenal nodule. Both demonstrate relative washout of greater than 40% consistent with adenomas.  No follow-up recommended. Kidneys normal in overall size, orientation and position with symmetric enhancement and excretion. Single bilateral renal cysts, 4.9 cm midpole right renal cyst and 3.7 cm left upper pole renal cyst. No follow-up recommended. No other renal masses, no stones and no hydronephrosis. Normal ureters. Bladder mostly decompressed, otherwise unremarkable. Stomach/Bowel: Although limited by lack of overall distension, wall thickening is suggested of the left transverse colon and splenic flexure with subtle adjacent fat stranding. Remainder of the colon is unremarkable. Normal stomach and small bowel. Normal appendix visualized. Vascular/Lymphatic: Dilated abdominal aorta, infrarenal portion dilated to 3 cm. Aortic atherosclerosis. No enlarged lymph nodes. Reproductive: Uterus and bilateral adnexa are unremarkable. Other: No abdominal wall hernia or abnormality. No abdominopelvic ascites. Musculoskeletal: No fracture or acute finding.  No bone lesion. IMPRESSION: 1. Mild wall thickening of the left transverse colon and splenic flexure is consistent with a nonspecific  infectious or inflammatory colitis and the presumed source of GI bleeding. 2. No other acute abnormality within the abdomen or pelvis. 3. Bilateral small adrenal adenomas. No follow-up indicated. Bilateral renal cysts, also with no follow-up indicated. 4. 3 cm abdominal aortic aneurysm. Recommend follow-up ultrasound every 3 years. This recommendation follows ACR consensus guidelines: White Paper of the ACR Incidental Findings Committee II on Vascular Findings. J Am Coll Radiol 2013; 10:789-794. Electronically Signed   By: Amie Portland M.D.   On: 10/31/2022 14:31    EKG: Independently reviewed.  NSR with rate 69; no evidence of acute ischemia   Labs on Admission: I have personally reviewed the available labs and imaging studies at the time of the admission.  Pertinent labs:    Glucose 127 BUN 53/Creatinine 1.54/GFR 37; 49/1.25/45 on 4/8 WBC 14.4 Hgb 13.5 INR 1.1   Assessment and Plan: Principal Problem:   Colitis with rectal bleeding Active Problems:   Essential hypertension, benign   Hyperlipidemia   S/P CABG x 3   Acute kidney injury superimposed on chronic kidney disease (HCC)   AAA (abdominal aortic aneurysm) without rupture (HCC)    Colitis with rectal bleeding -Patient presenting with acute onset of n/v/d with rectal bleeding after eating leftover ground [place] -Concern for food poisoning -CT consistent with inflammatory colitis that is "the presumed source of GI bleeding" -Will admit to med surg -Rehydration with IVF at 100 cc/hr -Await stool panel to determine if antibiotic treatment is indicated - continue Cipro/Flagyl for now -Clear liquid diet, advanced to carb modified BRAT diet if tolerated tomorrow -Zofran prn -GI is aware of the patient and monitoring at a distance for now  AKI on stage 3a CKD -Patient with recently borderline compromised renal function   -Current creatinine is increased at least 1.5 times compared to baseline and presumed to have occurred  within the last 7 days -Likely due to prerenal secondary to severe dehydration -IVF -Follow up renal function by BMP -Avoid ACEI and NSAIDs   CAD -s/p CABG -No current CP -Hold ASA in the setting of GI bleeding  HTN -Hold lisinopril and spironolactone -Continue Toprol XL and Nifedipine, resuming tomorrow AM  HLD -Continue atorvastatin  AAA -Small, only 3 cm -Current recommendation is for surveillance [place] every 3 years    Advance Care Planning:   Code Status: Full Code - Code status was discussed with the patient at the time of admission.  The patient would want to receive full resuscitative measures at this time.   Consults: GI (following peripherally)  DVT Prophylaxis: SCDs  Family Communication: None present; she declined  to have my speak with her friend at the time of admission  Severity of Illness: The appropriate patient status for this patient is INPATIENT. Inpatient status is judged to be reasonable and necessary in order to provide the required intensity of service to ensure the patient's safety. The patient's presenting symptoms, physical exam findings, and initial radiographic and laboratory data in the context of their chronic comorbidities is felt to place them at high risk for further clinical deterioration. Furthermore, it is not anticipated that the patient will be medically stable for discharge from the [facility] within 2 midnights of admission.   * I certify that at the point of admission it is my clinical judgment that the patient will require inpatient [facility] care spanning beyond 2 midnights from the point of admission due to high intensity of service, high risk for further deterioration and high frequency of surveillance required.*  Author: Jonah Blue, MD 10/31/2022 6:24 PM  For on call review [email or web address].

## 2022-10-31 NOTE — ED Provider Notes (Addendum)
[place] EMERGENCY DEPARTMENT AT [facility] Provider Note   CSN: [id number] Arrival date & time: 10/31/22  1128       History  Chief Complaint  Patient presents with   Rectal Bleeding    Donna Woodward is a 67 y.o. female.  HPI     67 year old female comes in with chief complaint of rectal bleeding. Patient has history of hypertension, hyperlipidemia and CAD status post CABG.  She indicates that yesterday afternoon she started having episodes of cramping abdominal pain, nausea, vomiting and diarrhea.  She had about 5-6 episodes of emesis yesterday afternoon.  No emesis since then.  Emesis were nonbloody and nonbilious.  She has had bloody stools every 1-2 hours since yesterday afternoon.  Last episodes of bloody stool was around 830 or 9 in the morning.  Patient called PCP, she was advised to come to the emergency room.  She has history of hemorrhoids.  No history of GI bleed.  Review of system is positive for dizziness.  Home Medications Prior to Admission medications   Medication Sig Start Date End Date Taking? Authorizing Provider  aspirin EC 81 MG tablet Take 1 tablet (81 mg total) by mouth daily. 11/06/17   Aliene Beams, MD  atorvastatin (LIPITOR) 40 MG tablet Take 1 tablet (40 mg total) by mouth daily. 01/06/22   Wendall Stade, MD  Calcium Carb-Cholecalciferol (CALCIUM + D3 PO) Take 1 tablet by mouth daily with breakfast.    [provider]  cyclobenzaprine (FLEXERIL) 5 MG tablet TAKE 1 TO 2 TABLETS AT BEDTIME AS NEEDED FOR MUSCLE SPASM 07/07/22   Deeann Saint, MD  lisinopril (ZESTRIL) 40 MG tablet TAKE 1 TABLET EVERY DAY 02/18/22   Deeann Saint, MD  metoprolol succinate (TOPROL-XL) 100 MG 24 hr tablet TAKE 1 TABLET DAILY. TAKE WITH OR IMMEDIATELY FOLLOWING A MEAL. 01/30/22   Wendall Stade, MD  NIFEdipine (PROCARDIA-XL/NIFEDICAL-XL) 30 MG 24 hr tablet TAKE 1 TABLET EVERY DAY 04/14/22   Deeann Saint, MD  Omega-3 Fatty Acids (FISH OIL) 1000  MG CAPS Take 1,000 mg by mouth in the morning and at bedtime.     [provider]  spironolactone (ALDACTONE) 25 MG tablet TAKE 1 TABLET EVERY DAY 04/14/22   Wendall Stade, MD      Allergies    Bee venom    Review of Systems   Review of Systems  All other systems reviewed and are negative.   Physical Exam Updated Vital Signs BP 116/65   Pulse 61   Temp 98.5 F (36.9 C) (Oral)   Resp (!) 21   Ht 5\' 4"  (1.626 m)   Wt 67.4 kg   SpO2 93%   BMI 25.51 kg/m  Physical Exam Vitals and nursing note reviewed.  Constitutional:      Appearance: She is well-developed.  HENT:     Head: Atraumatic.  Eyes:     General: No scleral icterus.    Extraocular Movements: Extraocular movements intact.     Pupils: Pupils are equal, round, and reactive to light.  Cardiovascular:     Rate and Rhythm: Normal rate.  Pulmonary:     Effort: Pulmonary effort is normal.  Abdominal:     Palpations: Abdomen is soft.     Tenderness: There is no abdominal tenderness.  Musculoskeletal:     Cervical back: Normal range of motion and neck supple.  Skin:    General: Skin is warm and dry.  Neurological:  Mental Status: She is alert and oriented to person, place, and time.     ED Results / Procedures / Treatments   Labs (all labs ordered are listed, but only abnormal results are displayed) Labs Reviewed  COMPREHENSIVE METABOLIC PANEL - Abnormal; Notable for the following components:      Result Value   Glucose, Bld 127 (*)    BUN 53 (*)    Creatinine, Ser 1.54 (*)    GFR, Estimated 37 (*)    All other components within normal limits  CBC - Abnormal; Notable for the following components:   WBC 14.4 (*)    All other components within normal limits  GASTROINTESTINAL PANEL BY PCR, STOOL (REPLACES STOOL CULTURE)  PROTIME-INR  POC OCCULT BLOOD, ED  TYPE AND SCREEN    EKG None  Radiology CT ABDOMEN PELVIS W CONTRAST  Result Date: 10/31/2022 CLINICAL DATA:  Lower GI bleeding.   Crampy abdominal pain. EXAM: CT ABDOMEN AND PELVIS WITH CONTRAST TECHNIQUE: Multidetector CT imaging of the abdomen and pelvis was performed using the standard protocol following bolus administration of intravenous contrast. RADIATION DOSE REDUCTION: This exam was performed according to the departmental dose-optimization program which includes automated exposure control, adjustment of the mA and/or kV according to patient size and/or use of iterative reconstruction technique. CONTRAST:  60mL OMNIPAQUE IOHEXOL 350 MG/ML SOLN COMPARISON:  None Available. FINDINGS: Lower chest: Clear lung bases. Hepatobiliary: Normal liver. Small densities in the gallbladder suspected to be small stones. No gallbladder wall thickening or adjacent inflammation. Common bile duct dilated to 8 mm. No visualized duct stone. Pancreas: Unremarkable. No pancreatic ductal dilatation or surrounding inflammatory changes. Spleen: Normal in size without focal abnormality. Adrenals/Urinary Tract: 1.3 cm left adrenal nodule and 1.1 cm right adrenal nodule. Both demonstrate relative washout of greater than 40% consistent with adenomas. No follow-up recommended. Kidneys normal in overall size, orientation and position with symmetric enhancement and excretion. Single bilateral renal cysts, 4.9 cm midpole right renal cyst and 3.7 cm left upper pole renal cyst. No follow-up recommended. No other renal masses, no stones and no hydronephrosis. Normal ureters. Bladder mostly decompressed, otherwise unremarkable. Stomach/Bowel: Although limited by lack of overall distension, wall thickening is suggested of the left transverse colon and splenic flexure with subtle adjacent fat stranding. Remainder of the colon is unremarkable. Normal stomach and small bowel. Normal appendix visualized. Vascular/Lymphatic: Dilated abdominal aorta, infrarenal portion dilated to 3 cm. Aortic atherosclerosis. No enlarged lymph nodes. Reproductive: Uterus and bilateral adnexa are  unremarkable. Other: No abdominal wall hernia or abnormality. No abdominopelvic ascites. Musculoskeletal: No fracture or acute finding.  No bone lesion. IMPRESSION: 1. Mild wall thickening of the left transverse colon and splenic flexure is consistent with a nonspecific infectious or inflammatory colitis and the presumed source of GI bleeding. 2. No other acute abnormality within the abdomen or pelvis. 3. Bilateral small adrenal adenomas. No follow-up indicated. Bilateral renal cysts, also with no follow-up indicated. 4. 3 cm abdominal aortic aneurysm. Recommend follow-up ultrasound every 3 years. This recommendation follows ACR consensus guidelines: White Paper of the ACR Incidental Findings Committee II on Vascular Findings. J Am Coll Radiol 2013; 10:789-794. Electronically Signed   By: Amie Portland M.D.   On: 10/31/2022 14:31    Procedures Procedures    Medications Ordered in ED Medications  lactated ringers bolus 1,000 mL (has no administration in time range)  ciprofloxacin (CIPRO) IVPB 400 mg (has no administration in time range)  metroNIDAZOLE (FLAGYL) IVPB 500 mg (has no administration  in time range)  lactated ringers bolus 1,000 mL (has no administration in time range)  pantoprazole (PROTONIX) 80 mg /NS 100 mL IVPB (has no administration in time range)  iohexol (OMNIPAQUE) 350 MG/ML injection 60 mL (60 mLs Intravenous Contrast Given 10/31/22 1414)    ED Course/ Medical Decision Making/ A&P                             Medical Decision Making Amount and/or Complexity of Data Reviewed Labs: ordered. Radiology: ordered.  Risk Prescription drug management. Decision regarding hospitalization.   This patient presents to the ED with chief complaint(s) of bloody stools, diarrhea, abdominal cramping and nausea and vomiting with pertinent past medical history of tension, hyperlipidemia.The complaint involves an extensive differential diagnosis and also carries with it a high risk of  complications and morbidity.    The differential diagnosis includes : Acute colitis due to infection/inflammation or ischemia.  Differential also includes viral gastroenteritis, dysentery, food poisoning.  Other possibilities given the diarrhea includes dehydration, severe electrolyte abnormality and severe anemia.  The initial plan is to get basic labs and reassess the patient.  We will also get CT scan.   Additional history obtained: Records reviewed  previous colonoscopy, it revealed internal and external hemorrhoids.  Independent labs interpretation:  The following labs were independently interpreted: Patient has BUN of 53, creatinine of 1.5.  She has white count of 14.4. Patient has acute kidney injury, likely because of dehydration.  BUN/creatinine ratio over 20, showing prerenal AKI.  Clinically not septic.  Independent visualization and interpretation of imaging: - I independently visualized the following imaging with scope of interpretation limited to determining acute life threatening conditions related to emergency care: CT abdomen pelvis, which revealed evidence of fat stranding in the colon.  According to radiologist, there is colitis.  Treatment and Reassessment: Patient reassessed.  She feels better.  No episodes of diarrhea here.  Blood pressure slightly towards the lower end, noted to have prerenal AKI.  Will admit her to the hospital.  Elevated BUN is a marker for dehydration and not upper GI bleed in my opinion.  She has no melena and denies any bloody vomitus.    Final Clinical Impression(s) / ED Diagnoses Final diagnoses:  Colitis  Acute lower GI bleeding  AKI (acute kidney injury) [facility])    Rx / DC Orders ED Discharge Orders     None         Derwood Kaplan, MD 10/31/22 1509    Derwood Kaplan, MD 10/31/22 1542

## 2022-10-31 NOTE — ED Notes (Signed)
ED TO INPATIENT HANDOFF REPORT  ED Nurse Name and Phone #: Morrie Sheldon RN [phone]  S Name/Age/Gender Donna Woodward 67 y.o. female Room/Bed: 031C/031C  Code Status   Code Status: Prior  Home/SNF/Other Home Patient oriented to: self, place, time, and situation Is this baseline? Yes   Triage Complete: Triage complete  Chief Complaint Colitis with rectal bleeding [K52.9, K62.5]  Triage Note Pt arrived POV after PCP sent her here for GI bleed. Pt reports no BMs d/t food poisoning and that pt is experiencing intermittent lower abd cramps and then dark red blood w/ some clots comes out of her rectum. No prior hx of BI bleed. Pt in NAD at this time. Pt w/ hx of hemorrhoids.   Allergies Allergies  Allergen Reactions   Bee Venom Swelling and Other (See Comments)    Swells where stung- no breathing issues    Level of Care/Admitting Diagnosis ED Disposition     ED Disposition  Admit   Condition  --   Comment  Hospital Area: [facility] [[id number]]  Level of Care: Med-Surg [16]  May admit patient to [facility] or [facility] if equivalent level of care is available:: Yes  Covid Evaluation: Asymptomatic - no recent exposure (last 10 days) testing not required  Diagnosis: Colitis with rectal bleeding [[id number]]  Admitting Physician: Jonah Blue [[id number]]  Attending Physician: Jonah Blue [[id number]]  Certification:: I certify this patient will need inpatient services for at least 2 midnights  Estimated Length of Stay: 2          B Medical/Surgery History Past Medical History:  Diagnosis Date   Anxiety    Back pain    Cataract    Coronary artery disease    CABGx3; Seen by Dr. 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   Depression    GERD (gastroesophageal reflux disease)    if overweight   Hyperlipidemia    Hypertension    Past Surgical History:  Procedure Laterality Date   BACK SURGERY     CARDIAC CATHETERIZATION N/A  06/05/2016   Procedure: Left Heart Cath and Coronary Angiography;  Surgeon: Lyn Records, MD;  Location: [facility] INVASIVE CV LAB;  Service: Cardiovascular;  Laterality: N/A;   CORONARY ARTERY BYPASS GRAFT N/A 06/17/2016   Procedure: CORONARY ARTERY BYPASS GRAFTING (CABG), ON PUMP, TIMES THREE, USING LEFT INTERNAL MAMMARY ARTERY, RIGHT GREATER SAPHENOUS VEIN HARVESTED ENDOSCOPICALLY;  Surgeon: Kerin Perna, MD;  Location[facility] OR;  Service: Open Heart Surgery;  Laterality: N/A;   MULTIPLE TOOTH EXTRACTIONS     SPINE SURGERY  1990   lamnectomy L5S1   TEE WITHOUT CARDIOVERSION N/A 06/17/2016   Procedure: TRANSESOPHAGEAL ECHOCARDIOGRAM (TEE);  Surgeon: Kerin Perna, MD;  Location: [facility] OR;  Service: Open Heart Surgery;  Laterality: N/A;     A IV Location/Drains/Wounds Patient Lines/Drains/Airways Status     Active Line/Drains/Airways     Name Placement date Placement time Site Days   Peripheral IV 10/31/22 20 G Left Antecubital 10/31/22  1252  Antecubital  less than 1   Peripheral IV 10/31/22 20 G Anterior;Proximal;Right Forearm 10/31/22  1530  Forearm  less than 1            Intake/Output Last 24 hours No intake or output data in the 24 hours ending 10/31/22 1549  Labs/Imaging Results for orders placed or performed during the hospital encounter of 10/31/22 (from the past 48 hour(s))  Comprehensive metabolic panel  Status: Abnormal   Collection Time: 10/31/22 11:56 AM  Result Value Ref Range   Sodium 136 135 - 145 mmol/L   Potassium 4.6 3.5 - 5.1 mmol/L   Chloride 98 98 - 111 mmol/L   CO2 25 22 - 32 mmol/L   Glucose, Bld 127 (H) 70 - 99 mg/dL    Comment: Glucose reference range applies only to samples taken after fasting for at least 8 hours.   BUN 53 (H) 8 - 23 mg/dL   Creatinine, Ser 1.61 (H) 0.44 - 1.00 mg/dL   Calcium 9.7 8.9 - 09.6 mg/dL   Total Protein 6.9 6.5 - 8.1 g/dL   Albumin 3.9 3.5 - 5.0 g/dL   AST 18 15 - 41 U/L   ALT 19 0 - 44 U/L   Alkaline Phosphatase 45  38 - 126 U/L   Total Bilirubin 0.7 0.3 - 1.2 mg/dL   GFR, Estimated 37 (L) >60 mL/min    Comment: (NOTE) Calculated using the CKD-EPI Creatinine Equation (2021)    Anion gap 13 5 - 15    Comment: Performed at [facility], [address], [place], [place] [address]  CBC     Status: Abnormal   Collection Time: 10/31/22 11:56 AM  Result Value Ref Range   WBC 14.4 (H) 4.0 - 10.5 K/uL   RBC 4.51 3.87 - 5.11 MIL/uL   Hemoglobin 13.5 12.0 - 15.0 g/dL   HCT 98.1 19.1 - 47.8 %   MCV 91.1 80.0 - 100.0 fL   MCH 29.9 26.0 - 34.0 pg   MCHC 32.8 30.0 - 36.0 g/dL   RDW 29.5 62.1 - 30.8 %   Platelets 282 150 - 400 K/uL   nRBC 0.0 0.0 - 0.2 %    Comment: Performed at [facility], [address] [place], [place] [address]  Type and screen [facility]     Status: None   Collection Time: 10/31/22 11:56 AM  Result Value Ref Range   ABO/RH(D) O POS    Antibody Screen NEG    Sample Expiration      11/03/2022,2359 Performed at [facility], [address] [place], [place] [address]   Protime-INR     Status: None   Collection Time: 10/31/22 12:51 PM  Result Value Ref Range   Prothrombin Time 13.7 11.4 - 15.2 seconds   INR 1.1 0.8 - 1.2    Comment: (NOTE) INR goal varies based on device and disease states. Performed at [facility], [address] [place], [place] [address]    CT ABDOMEN PELVIS W CONTRAST  Result Date: 10/31/2022 CLINICAL DATA:  Lower GI bleeding.  Crampy abdominal pain. EXAM: CT ABDOMEN AND PELVIS WITH CONTRAST TECHNIQUE: Multidetector CT imaging of the abdomen and pelvis was performed using the standard protocol following bolus administration of intravenous contrast. RADIATION DOSE REDUCTION: This exam was performed according to the departmental dose-optimization program which includes automated exposure control, adjustment of the mA and/or kV according to patient size and/or use of iterative reconstruction technique. CONTRAST:  60mL  OMNIPAQUE IOHEXOL 350 MG/ML SOLN COMPARISON:  None Available. FINDINGS: Lower chest: Clear lung bases. Hepatobiliary: Normal liver. Small densities in the gallbladder suspected to be small stones. No gallbladder wall thickening or adjacent inflammation. Common bile duct dilated to 8 mm. No visualized duct stone. Pancreas: Unremarkable. No pancreatic ductal dilatation or surrounding inflammatory changes. Spleen: Normal in size without focal abnormality. Adrenals/Urinary Tract: 1.3 cm left adrenal  nodule and 1.1 cm right adrenal nodule. Both demonstrate relative washout of greater than 40% consistent with adenomas. No follow-up recommended. Kidneys normal in overall size, orientation and position with symmetric enhancement and excretion. Single bilateral renal cysts, 4.9 cm midpole right renal cyst and 3.7 cm left upper pole renal cyst. No follow-up recommended. No other renal masses, no stones and no hydronephrosis. Normal ureters. Bladder mostly decompressed, otherwise unremarkable. Stomach/Bowel: Although limited by lack of overall distension, wall thickening is suggested of the left transverse colon and splenic flexure with subtle adjacent fat stranding. Remainder of the colon is unremarkable. Normal stomach and small bowel. Normal appendix visualized. Vascular/Lymphatic: Dilated abdominal aorta, infrarenal portion dilated to 3 cm. Aortic atherosclerosis. No enlarged lymph nodes. Reproductive: Uterus and bilateral adnexa are unremarkable. Other: No abdominal wall hernia or abnormality. No abdominopelvic ascites. Musculoskeletal: No fracture or acute finding.  No bone lesion. IMPRESSION: 1. Mild wall thickening of the left transverse colon and splenic flexure is consistent with a nonspecific infectious or inflammatory colitis and the presumed source of GI bleeding. 2. No other acute abnormality within the abdomen or pelvis. 3. Bilateral small adrenal adenomas. No follow-up indicated. Bilateral renal cysts, also  with no follow-up indicated. 4. 3 cm abdominal aortic aneurysm. Recommend follow-up ultrasound every 3 years. This recommendation follows ACR consensus guidelines: White Paper of the ACR Incidental Findings Committee II on Vascular Findings. J Am Coll Radiol 2013; 10:789-794. Electronically Signed   By: Amie Portland M.D.   On: 10/31/2022 14:31    Pending Labs Unresulted Labs (From admission, onward)     Start     Ordered   10/31/22 1238  Gastrointestinal Panel by PCR , Stool  (Gastrointestinal Panel by PCR, Stool                                                                                                                                                     **Does Not include CLOSTRIDIUM DIFFICILE testing. **If CDIFF testing is needed, place order from the "C Difficile Testing" order set.**)  Once,   URGENT        10/31/22 1238            Vitals/Pain Today's Vitals   10/31/22 1146 10/31/22 1345 10/31/22 1503 10/31/22 1535  BP:  97/61 116/65   Pulse:  (!) 59 61   Resp:  16 (!) 21   Temp:    97.6 F (36.4 C)  TempSrc:    Oral  SpO2:  95% 93%   Weight: 67.4 kg     Height: 5\' 4"  (1.626 m)     PainSc:        Isolation Precautions Droplet and Enteric precautions (UV disinfection)  Medications Medications  ciprofloxacin (CIPRO) IVPB 400 mg (has no administration in time range)  metroNIDAZOLE (FLAGYL) IVPB 500 mg (500 mg Intravenous  New Bag/Given 10/31/22 1523)  pantoprazole (PROTONIX) 80 mg /NS 100 mL IVPB (80 mg Intravenous New Bag/Given 10/31/22 1535)  iohexol (OMNIPAQUE) 350 MG/ML injection 60 mL (60 mLs Intravenous Contrast Given 10/31/22 1414)  lactated ringers bolus 1,000 mL (1,000 mLs Intravenous New Bag/Given 10/31/22 1521)  lactated ringers bolus 1,000 mL (1,000 mLs Intravenous New Bag/Given 10/31/22 1520)    Mobility walks     Focused Assessments Renal Assessment Handoff:     R Recommendations: See Admitting Provider Note  Report given to:   Additional Notes:

## 2022-10-31 NOTE — Progress Notes (Addendum)
   Established Patient Office Visit   Subjective  Patient ID: Donna Woodward, female    DOB: 1956-03-24  Age: 67 y.o. MRN: [id number]  Chief Complaint  Patient presents with   Food Poisoning    X1 day,     Pt is a 67 yo female with pmh sig for HTN, CAD s/p CABG x 3, HLD, history of depression who is seen for acute concern.  Patient endorses concern for food poisoning x 1 d.  On Thursday 5/2, pt ate leftover taco salad from Tuesday that have ground [place] and sour cream.  Patient then developed abdominal cramping, emesis, diarrhea, cramping in extremities.  Patient continued to have loose stools every 1/2 hour.  That evening developed rectal bleeding with blood in toilet and on toilet paper with wiping which has continued.  Able to keep some water and Gatorade down yesterday.  Patient denies dizziness.      ROS Negative unless stated above    Objective:     BP (!) 96/50 (BP Location: Left Arm, Patient Position: Sitting, Cuff Size: Normal)   Pulse 70   Temp 97.6 F (36.4 C) (Oral)   Ht 5\' 4"  (1.626 m)   Wt 148 lb 9.6 oz (67.4 kg)   SpO2 95%   BMI 25.51 kg/m  BP Readings from Last 3 Encounters:  10/31/22 (!) 96/50  10/06/22 110/78  07/09/22 128/68   Wt Readings from Last 3 Encounters:  10/31/22 148 lb 9.6 oz (67.4 kg)  10/06/22 150 lb 12.8 oz (68.4 kg)  05/14/22 163 lb 2.3 oz (74 kg)      Physical Exam Constitutional:      General: She is not in acute distress.    Appearance: Normal appearance. She is ill-appearing.  HENT:     Head: Normocephalic and atraumatic.     Nose: Nose normal.     Mouth/Throat:     Mouth: Mucous membranes are dry.  Eyes:     Extraocular Movements: Extraocular movements intact.     Conjunctiva/sclera: Conjunctivae normal.     Pupils: Pupils are equal, round, and reactive to light.  Cardiovascular:     Rate and Rhythm: Normal rate and regular rhythm.     Heart sounds: Normal heart sounds. No murmur heard.    No gallop.  Pulmonary:      Effort: Pulmonary effort is normal. No respiratory distress.     Breath sounds: Normal breath sounds. No wheezing, rhonchi or rales.  Abdominal:     Palpations: Abdomen is soft.     Tenderness: There is generalized abdominal tenderness.     Comments: Hyperactive bowel sounds.  Genitourinary:    Comments: Rectal exam deferred. Skin:    General: Skin is warm and dry.  Neurological:     Mental Status: She is alert and oriented to person, place, and time.      No results found for any visits on 10/31/22.    Assessment & Plan:  Gastroenteritis  Rectal bleeding  Dehydration  Hypotension due to hypovolemia  Discussed possible causes of bleeding including diverticulitis, hemorrhoids, infectious etiology, UGIB, fissure, etc.  Given rectal bleeding and concerns for dehydration given fluid losses from gastroenteritis patient advised to proceed to nearest ED for further evaluation including labs to evaluate for anemia and electrolyte derangement, IVFs, etc.  Patient expressed understanding.   [facility] given report by this provider.  Return if symptoms worsen or fail to improve.   Deeann Saint, MD

## 2022-11-01 ENCOUNTER — Encounter: Payer: Self-pay | Admitting: Family Medicine

## 2022-11-01 ENCOUNTER — Encounter: Payer: Self-pay | Admitting: Gastroenterology

## 2022-11-01 DIAGNOSIS — N179 Acute kidney failure, unspecified: Secondary | ICD-10-CM

## 2022-11-01 DIAGNOSIS — K625 Hemorrhage of anus and rectum: Secondary | ICD-10-CM | POA: Diagnosis not present

## 2022-11-01 DIAGNOSIS — K529 Noninfective gastroenteritis and colitis, unspecified: Secondary | ICD-10-CM | POA: Diagnosis not present

## 2022-11-01 LAB — CBC
HCT: 34 % — ABNORMAL LOW (ref 36.0–46.0)
Hemoglobin: 11.4 g/dL — ABNORMAL LOW (ref 12.0–15.0)
MCH: 29.9 pg (ref 26.0–34.0)
MCHC: 33.5 g/dL (ref 30.0–36.0)
MCV: 89.2 fL (ref 80.0–100.0)
Platelets: 204 10*3/uL (ref 150–400)
RBC: 3.81 MIL/uL — ABNORMAL LOW (ref 3.87–5.11)
RDW: 14.3 % (ref 11.5–15.5)
WBC: 10 10*3/uL (ref 4.0–10.5)
nRBC: 0 % (ref 0.0–0.2)

## 2022-11-01 LAB — BASIC METABOLIC PANEL
Anion gap: 5 (ref 5–15)
BUN: 32 mg/dL — ABNORMAL HIGH (ref 8–23)
CO2: 27 mmol/L (ref 22–32)
Calcium: 8.9 mg/dL (ref 8.9–10.3)
Chloride: 102 mmol/L (ref 98–111)
Creatinine, Ser: 1.16 mg/dL — ABNORMAL HIGH (ref 0.44–1.00)
GFR, Estimated: 52 mL/min — ABNORMAL LOW (ref >60)
Glucose, Bld: 104 mg/dL — ABNORMAL HIGH (ref 70–99)
Potassium: 4.5 mmol/L (ref 3.5–5.1)
Sodium: 134 mmol/L — ABNORMAL LOW (ref 135–145)

## 2022-11-01 LAB — HIV ANTIBODY (ROUTINE TESTING W REFLEX): HIV Screen 4th Generation wRfx: NONREACTIVE

## 2022-11-01 MED ORDER — SPIRONOLACTONE 25 MG PO TABS
25.0000 mg | ORAL_TABLET | Freq: Every day | ORAL | Status: DC
Start: 2022-11-03 — End: 2023-01-30

## 2022-11-01 MED ORDER — LISINOPRIL 40 MG PO TABS
40.0000 mg | ORAL_TABLET | Freq: Every day | ORAL | Status: DC
Start: 2022-11-03 — End: 2022-11-17

## 2022-11-01 NOTE — Care Management Obs Status (Signed)
MEDICARE OBSERVATION STATUS NOTIFICATION   Patient Details  Name: Donna Woodward MRN: [id number] Date of Birth: 1955/10/19   Medicare Observation Status Notification Given:  Yes    Dillard Pascal G., RN 11/01/2022, 12:20 PM

## 2022-11-01 NOTE — Progress Notes (Signed)
Pt was able to tolerate soft diet with no GI problem. MD was notified for potential discharge.

## 2022-11-01 NOTE — Care Management CC44 (Signed)
Condition Code 44 Documentation Completed  Patient Details  Name: Donna Woodward MRN: [id number] Date of Birth: 06-13-1956   Condition Code 44 given:    Patient signature on Condition Code 44 notice:    Documentation of 2 MD's agreement:    Code 44 added to claim:       Tyronn Golda G., RN 11/01/2022, 12:21 PM

## 2022-11-01 NOTE — Discharge Instructions (Signed)
Additional Discharge Instructions   Please get your medications reviewed and adjusted by your Primary MD.  Please request your Primary MD to go over all Hospital Tests and Procedure/Radiological results at the follow up, please get all Hospital records sent to your Prim MD by signing hospital release before you go home.  If you had Pneumonia of Lung problems at the Hospital: Please get a 2 view Chest X ray done in approximately 4 weeks after hospital discharge or sooner if instructed by your Primary MD.  If you have Congestive Heart Failure: Please call your Cardiologist or Primary MD anytime you have any of the following symptoms:  1) 3 pound weight gain in 24 hours or 5 pounds in 1 week  2) shortness of breath, with or without a dry hacking cough  3) swelling in the hands, feet or stomach  4) if you have to sleep on extra pillows at night in order to breathe  Follow cardiac low salt diet and 1.5 lit/day fluid restriction.  If you have diabetes Accuchecks 4 times/day, Once in AM empty stomach and then before each meal. Log in all results and show them to your primary doctor at your next visit. If any glucose reading is under 80 or above 300 call your primary MD immediately.  If you have Seizure/Convulsions/Epilepsy: Please do not drive, operate heavy machinery, participate in activities at heights or participate in high speed sports until you have seen by Primary MD or a Neurologist and advised to do so again. Per [place] DMV statutes, patients with seizures are not allowed to drive until they have been seizure-free for six months.  Use caution when using heavy equipment or power tools. Avoid working on ladders or at heights. Take showers instead of baths. Ensure the water temperature is not too high on the home water heater. Do not go swimming alone. Do not lock yourself in a room alone (i.e. bathroom). When caring for infants or small children, sit down when holding, feeding, or  changing them to minimize risk of injury to the child in the event you have a seizure. Maintain good sleep hygiene. Avoid alcohol.   If you had Gastrointestinal Bleeding: Please ask your Primary MD to check a complete blood count within one week of discharge or at your next visit. Your endoscopic/colonoscopic biopsies that are pending at the time of discharge, will also need to followed by your Primary MD.  Get Medicines reviewed and adjusted. Please take all your medications with you for your next visit with your Primary MD  Please request your Primary MD to go over all hospital tests and procedure/radiological results at the follow up, please ask your Primary MD to get all Hospital records sent to his/her office.  If you experience worsening of your admission symptoms, develop shortness of breath, life threatening emergency, suicidal or homicidal thoughts you must seek medical attention immediately by calling 911 or calling your MD immediately  if symptoms less severe.  You must read complete instructions/literature along with all the possible adverse reactions/side effects for all the Medicines you take and that have been prescribed to you. Take any new Medicines after you have completely understood and accpet all the possible adverse reactions/side effects.   Do not drive or operate heavy machinery when taking Pain medications.   Do not take more than prescribed Pain, Sleep and Anxiety Medications  Special Instructions: If you have smoked or chewed Tobacco  in the last 2 yrs please stop smoking, stop any   regular Alcohol  and or any Recreational drug use.  Wear Seat belts while driving.  Please note You were cared for by a hospitalist during your hospital stay. If you have any questions about your discharge medications or the care you received while you were in the hospital after you are discharged, you can call the unit and asked to speak with the hospitalist on call if the hospitalist  that took care of you is not available. Once you are discharged, your primary care physician will handle any further medical issues. Please note that NO REFILLS for any discharge medications will be authorized once you are discharged, as it is imperative that you return to your primary care physician (or establish a relationship with a primary care physician if you do not have one) for your aftercare needs so that they can reassess your need for medications and monitor your lab values.  You can reach the hospitalist office at phone [phone] or fax [phone]   If you do not have a primary care physician, you can call [phone] for a physician referral.   Following findings on CT abdomen from 10/31/2022 needs outpatient follow-up which can be arranged through your family physician. 3 cm abdominal aortic aneurysm. Recommend follow-up ultrasound every 3 years.

## 2022-11-01 NOTE — Progress Notes (Signed)
No acute distress overnight. Pt wants to eat and c/o hunger. Clear liquid diet maintained.

## 2022-11-01 NOTE — Progress Notes (Signed)
Pt has DC order after tolerating soft diet without any GI symptom. Code44 has been resolve and was given the approval for pt's discharge. AVS was given to pt and explained everything. Pt left the floor via wheelchair by NT.

## 2022-11-01 NOTE — Discharge Summary (Signed)
Physician Discharge Summary  Donna Woodward GNF:[id number] DOB: 1956/05/11  PCP: Deeann Saint, MD  Admitted from: Home Discharged to: Home  Admit date: 10/31/2022 Discharge date: 11/01/2022  Recommendations for Outpatient Follow-up:    Follow-up Information     Deeann Saint, MD. Schedule an appointment as soon as possible for a visit in 1 week(s).   Specialty: Family Medicine Why: To be seen with repeat labs (CBC & CMP). Contact information: [address] [place] [address] [phone]         Napoleon Form, MD. Schedule an appointment as soon as possible for a visit.   Specialty: Gastroenterology Contact information: [address] [place] [address] [phone]                  Home Health: None    Equipment/Devices: None    Discharge Condition: Improved and stable.   Code Status: Full Code Diet recommendation:  Discharge Diet Orders (From admission, onward)     Start     Ordered   11/01/22 0000  Diet - low sodium heart healthy        11/01/22 1146             Discharge Diagnoses:  Principal Problem:   Colitis with rectal bleeding Active Problems:   Essential hypertension, benign   Hyperlipidemia   S/P CABG x 3   Acute kidney injury superimposed on chronic kidney disease (HCC)   AAA (abdominal aortic aneurysm) without rupture [facility])   Brief Summary: 67 year old female, lives alone and independent, medical history significant for CAD s/p CABG, HTN, HLD who presented to the ED on 10/31/2022 with complaints of abdominal pain, nausea, vomiting and rectal bleeding.  She was admitted for suspected food poisoning/viral colitis, treated supportively, clinically improved quickly.  3 days prior to admission, patient on Tuesday ("taco Tuesday"), had some homemade taco consisting of ground [place] vegetables but no sour cream.  She was fine the next 2 days.  On Thursday PTA, she repeated the toco meal with leftover ground  [place] and had with sour cream, may be an avocado and other vegetables.  Within 2 hours, she developed severe abdominal cramping, nausea, vomiting and diarrhea that lasted about 4 hours.  This was then followed by abdominal cramping and bloody diarrhea for a couple of hours, low volume blood per her report.  She was advised by her PCP to come to the ED.  Last BM or rectal bleeding was on 10/30/2022 and none since.  ED course: Afebrile, transient mild hypotension with BP of 96/50 but otherwise stable.  Lab work significant for BUN 53, creatinine 1.54, WBC 14.4.  CT abdomen showed mild wall thickening of the left transverse colon and splenic flexure consistent with a nonspecific infectious or inflammatory colitis and the presumed source of GI bleeding.  No other acute abnormality within the abdomen or pelvis.  Hospital course: She was admitted for suspected colitis with rectal bleeding, acute kidney injury complicating stage IIIa CKD from dehydration due to GI losses and poor oral intake.  She was placed on clear liquid diet.  She received 2 L of IV fluid bolus, continued on maintenance IV fluids, empirically started on IV ciprofloxacin and metronidazole and got a dose of IV PPI.  Due to her AKI, spironolactone and lisinopril were held.  This morning, patient has done quite well.  No further GI or abdominal symptoms overnight and no BM or rectal bleeding since 5/2 morning.  Hungry.  Advance  to soft diet which she tolerated without complaints.  Reviewed with [facility], Dr. Doristine Locks, agreed with supportive care and discharge home off of antibiotics.  Suspect food poisoning versus viral GE.  AKI has resolved but will hold lisinopril and Aldactone for another 48 hours prior to resumption with close follow-up of BMP in a week's time with PCP.  Blood pressures are controlled or even slightly soft on Toprol-XL and Procardia XL.  Patient reports that she is up-to-date on her colonoscopy.  She follows with Dr.  Enriqueta Shutter, [facility].  She had colonoscopy in August of last year which showed a "precancerous polyp" and is scheduled to have a repeat colonoscopy August 2024.  She is advised to follow-up with GI.  She has mild anemia which may be multifactorial, likely due to hemodilution from IV fluids and a small element of low volume GI bleed.  Follow CBC closely as outpatient.  Incidental AAA noted on CT abdomen on admission which will need to be followed up as outpatient.   Consultations: Although [facility] had been consulted, given her clinical improvement, discussed with [facility] and no formal consultation was done.  Procedures: None.   Discharge Instructions  Discharge Instructions     Call MD for:   Complete by: As directed    Recurrent rectal bleeding.   Call MD for:  extreme fatigue   Complete by: As directed    Call MD for:  persistant dizziness or light-headedness   Complete by: As directed    Call MD for:  persistant nausea and vomiting   Complete by: As directed    Call MD for:  severe uncontrolled pain   Complete by: As directed    Call MD for:  temperature >100.4   Complete by: As directed    Diet - low sodium heart healthy   Complete by: As directed    Increase activity slowly   Complete by: As directed         Medication List     TAKE these medications    aspirin EC 81 MG tablet Take 1 tablet (81 mg total) by mouth daily.   atorvastatin 40 MG tablet Commonly known as: LIPITOR Take 1 tablet (40 mg total) by mouth daily.   CALCIUM + D3 PO Take 1 tablet by mouth daily with breakfast.   cyclobenzaprine 5 MG tablet Commonly known as: FLEXERIL TAKE 1 TO 2 TABLETS AT BEDTIME AS NEEDED FOR MUSCLE SPASM What changed: See the new instructions.   Fish Oil 1000 MG Caps Take 1,000 mg by mouth in the morning and at bedtime.   lisinopril 40 MG tablet Commonly known as: ZESTRIL Take 1 tablet (40 mg total) by mouth daily. Start taking on: Nov 03, 2022 What changed: These instructions start on Nov 03, 2022. If you are unsure what to do until then, ask your doctor or other care provider.   metoprolol succinate 100 MG 24 hr tablet Commonly known as: TOPROL-XL TAKE 1 TABLET DAILY. TAKE WITH OR IMMEDIATELY FOLLOWING A MEAL. What changed: See the new instructions.   NIFEdipine 30 MG 24 hr tablet Commonly known as: PROCARDIA-XL/NIFEDICAL-XL TAKE 1 TABLET EVERY DAY   spironolactone 25 MG tablet Commonly known as: ALDACTONE Take 1 tablet (25 mg total) by mouth daily. Start taking on: Nov 03, 2022 What changed: These instructions start on Nov 03, 2022. If you are unsure what to do until then, ask your doctor or other care provider.  Allergies  Allergen Reactions   Bee Venom Swelling and Other (See Comments)    Swells where stung- no breathing issues      Procedures/Studies: CT ABDOMEN PELVIS W CONTRAST  Result Date: 10/31/2022 CLINICAL DATA:  Lower GI bleeding.  Crampy abdominal pain. EXAM: CT ABDOMEN AND PELVIS WITH CONTRAST TECHNIQUE: Multidetector CT imaging of the abdomen and pelvis was performed using the standard protocol following bolus administration of intravenous contrast. RADIATION DOSE REDUCTION: This exam was performed according to the departmental dose-optimization program which includes automated exposure control, adjustment of the mA and/or kV according to patient size and/or use of iterative reconstruction technique. CONTRAST:  60mL OMNIPAQUE IOHEXOL 350 MG/ML SOLN COMPARISON:  None Available. FINDINGS: Lower chest: Clear lung bases. Hepatobiliary: Normal liver. Small densities in the gallbladder suspected to be small stones. No gallbladder wall thickening or adjacent inflammation. Common bile duct dilated to 8 mm. No visualized duct stone. Pancreas: Unremarkable. No pancreatic ductal dilatation or surrounding inflammatory changes. Spleen: Normal in size without focal abnormality. Adrenals/Urinary Tract: 1.3 cm left  adrenal nodule and 1.1 cm right adrenal nodule. Both demonstrate relative washout of greater than 40% consistent with adenomas. No follow-up recommended. Kidneys normal in overall size, orientation and position with symmetric enhancement and excretion. Single bilateral renal cysts, 4.9 cm midpole right renal cyst and 3.7 cm left upper pole renal cyst. No follow-up recommended. No other renal masses, no stones and no hydronephrosis. Normal ureters. Bladder mostly decompressed, otherwise unremarkable. Stomach/Bowel: Although limited by lack of overall distension, wall thickening is suggested of the left transverse colon and splenic flexure with subtle adjacent fat stranding. Remainder of the colon is unremarkable. Normal stomach and small bowel. Normal appendix visualized. Vascular/Lymphatic: Dilated abdominal aorta, infrarenal portion dilated to 3 cm. Aortic atherosclerosis. No enlarged lymph nodes. Reproductive: Uterus and bilateral adnexa are unremarkable. Other: No abdominal wall hernia or abnormality. No abdominopelvic ascites. Musculoskeletal: No fracture or acute finding.  No bone lesion. IMPRESSION: 1. Mild wall thickening of the left transverse colon and splenic flexure is consistent with a nonspecific infectious or inflammatory colitis and the presumed source of GI bleeding. 2. No other acute abnormality within the abdomen or pelvis. 3. Bilateral small adrenal adenomas. No follow-up indicated. Bilateral renal cysts, also with no follow-up indicated. 4. 3 cm abdominal aortic aneurysm. Recommend follow-up ultrasound every 3 years. This recommendation follows ACR consensus guidelines: White Paper of the ACR Incidental Findings Committee II on Vascular Findings. J Am Coll Radiol 2013; 10:789-794. Electronically Signed   By: Amie Portland M.D.   On: 10/31/2022 14:31    Subjective: History as noted above.  No recurrence of diarrhea or rectal bleeding since 5/2 approximately 9 AM.  No abdominal pain, nausea or  vomiting.  Hungry and asking for food.  Tolerated soft diet without complaints.  Discharge Exam:  Vitals:   10/31/22 1735 10/31/22 2125 11/01/22 0400 11/01/22 0744  BP: (!) 146/83 113/69 121/70 111/60  Pulse: 67 60 61 62  Resp: 17 17 16 16   Temp: (!) 97.5 F (36.4 C) 97.8 F (36.6 C) 98 F (36.7 C) 98.3 F (36.8 C)  TempSrc: Oral Oral Oral Oral  SpO2: 98%  100% 96%  Weight:      Height:        General: Pleasant female female, moderately built and nourished sitting up comfortably in bed without distress.  Oral mucosa moist. Cardiovascular: S1 & S2 heard, RRR, S1/S2 +. No murmurs, rubs, gallops or clicks. No JVD or pedal edema. Respiratory:  Clear to auscultation without wheezing, rhonchi or crackles. No increased work of breathing. Abdominal:  Non distended, non tender & soft. No organomegaly or masses appreciated. Normal bowel sounds heard. CNS: Alert and oriented. No focal deficits. Extremities: no edema, no cyanosis    The results of significant diagnostics from this hospitalization (including imaging, microbiology, ancillary and laboratory) are listed below for reference.     Microbiology: No results found for this or any previous visit (from the past 240 hour(s)).   Labs: CBC: Recent Labs  Lab 10/31/22 1156 11/01/22 0120  WBC 14.4* 10.0  HGB 13.5 11.4*  HCT 41.1 34.0*  MCV 91.1 89.2  PLT 282 204    Basic Metabolic Panel: Recent Labs  Lab 10/31/22 1156 11/01/22 0120  NA 136 134*  K 4.6 4.5  CL 98 102  CO2 25 27  GLUCOSE 127* 104*  BUN 53* 32*  CREATININE 1.54* 1.16*  CALCIUM 9.7 8.9    Liver Function Tests: Recent Labs  Lab 10/31/22 1156  AST 18  ALT 19  ALKPHOS 45  BILITOT 0.7  PROT 6.9  ALBUMIN 3.9      Time coordinating discharge: 25 minutes  SIGNED:  Marcellus Scott, MD,  FACP, FHM, SFHM, [facility], [facility]   [facility] [place]     To contact the attending provider between 7A-7P or the covering  provider during after hours 7P-7A, please log into the web site [email or web address] and access using universal  password for that web site. If you do not have the password, please call the hospital operator.

## 2022-11-04 DIAGNOSIS — D105 Benign neoplasm of other parts of oropharynx: Secondary | ICD-10-CM | POA: Diagnosis not present

## 2022-11-04 MED ORDER — ATORVASTATIN CALCIUM 40 MG PO TABS: 40.0000 mg | ORAL_TABLET | Freq: Every day | ORAL | 3 refills | Status: DC

## 2022-11-06 NOTE — Progress Notes (Signed)
CARDIOLOGY OFFICE NOTE  Date:  11/14/2022    Donna Woodward Date of Birth: 04-Jul-1955 Medical Record #[id number]  PCP:  Deeann Saint, MD  Cardiologist:  Eden Emms   No chief complaint on file.   History of Present Illness: Donna Woodward is a 67 y.o. female  has a history of HTN, HLD, previous smoker and known CAD with prior CABG x 3 in 2017 with LIMA to LAD; SVG to PD and SVG to DX. Did have post op ventricular ectopy - required short term use of amiodarone.    Presented to the ER 05/02/20  with palpitations/pre syncope along with chest pressure and headache.. CT of the head negative. Troponin negative. Myovue called ischemia but to my read breast attenuation low risk K was low and diuretic d/c with increase in ACE BP up on visit with primary 06/08/20   Activity limited by chronic back pain   January aldactone added for BP and low K resolved   No chest pain compliant with meds Home BP readings have been normal   Changed statin to lipitor for better control LDL now 74   Hospitalized with food poisoning/viral colitis 11/01/22 Repeat colonoscopy scheduled for August 2024 with Dr Sissy Hoff  She is depressed moved here to be with a friend who then moved to [place] and they don't see each other No friends Wants to move to [place] area where she has family Trying to get debt free first   Past Medical History:  Diagnosis Date   Anxiety    Back pain    Cataract    Coronary artery disease    CABGx3; Seen by Dr. 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   Depression    GERD (gastroesophageal reflux disease)    if overweight   Hyperlipidemia    Hypertension     Past Surgical History:  Procedure Laterality Date   BACK SURGERY     CARDIAC CATHETERIZATION N/A 06/05/2016   Procedure: Left Heart Cath and Coronary Angiography;  Surgeon: Lyn Records, MD;  Location: [facility] INVASIVE CV LAB;  Service: Cardiovascular;  Laterality: N/A;    CORONARY ARTERY BYPASS GRAFT N/A 06/17/2016   Procedure: CORONARY ARTERY BYPASS GRAFTING (CABG), ON PUMP, TIMES THREE, USING LEFT INTERNAL MAMMARY ARTERY, RIGHT GREATER SAPHENOUS VEIN HARVESTED ENDOSCOPICALLY;  Surgeon: Kerin Perna, MD;  Location: [facility] OR;  Service: Open Heart Surgery;  Laterality: N/A;   MULTIPLE TOOTH EXTRACTIONS     SPINE SURGERY  1990   lamnectomy L5S1   TEE WITHOUT CARDIOVERSION N/A 06/17/2016   Procedure: TRANSESOPHAGEAL ECHOCARDIOGRAM (TEE);  Surgeon: Kerin Perna, MD;  Location: [facility] OR;  Service: Open Heart Surgery;  Laterality: N/A;     Medications: Current Meds  Medication Sig   aspirin EC 81 MG tablet Take 1 tablet (81 mg total) by mouth daily.   atorvastatin (LIPITOR) 40 MG tablet Take 1 tablet (40 mg total) by mouth daily.   Calcium Carb-Cholecalciferol (CALCIUM + D3 PO) Take 1 tablet by mouth daily with breakfast.   cyclobenzaprine (FLEXERIL) 5 MG tablet TAKE 1 TO 2 TABLETS AT BEDTIME AS NEEDED FOR MUSCLE SPASM (Patient taking differently: Take 5-10 mg by mouth at bedtime as needed for muscle spasms. TAKE 1 TO 2 TABLETS AT BEDTIME AS NEEDED FOR MUSCLE SPASM)   lisinopril (ZESTRIL) 40 MG tablet Take 1 tablet (40 mg total) by mouth daily.   metoprolol succinate (TOPROL-XL) 100 MG 24 hr tablet TAKE 1 TABLET  DAILY. TAKE WITH OR IMMEDIATELY FOLLOWING A MEAL. (Patient taking differently: Take 100 mg by mouth daily. TAKE 1 TABLET DAILY. TAKE WITH OR IMMEDIATELY FOLLOWING A MEAL.)   NIFEdipine (PROCARDIA-XL/NIFEDICAL-XL) 30 MG 24 hr tablet TAKE 1 TABLET EVERY DAY (Patient taking differently: Take 30 mg by mouth daily.)   Omega-3 Fatty Acids (FISH OIL) 1000 MG CAPS Take 1,000 mg by mouth in the morning and at bedtime.    spironolactone (ALDACTONE) 25 MG tablet Take 1 tablet (25 mg total) by mouth daily.     Allergies: Allergies  Allergen Reactions   Bee Venom Swelling and Other (See Comments)    Swells where stung- no breathing issues    Social History: The  patient  reports that she quit smoking about 15 years ago. Her smoking use included cigarettes. She has never used smokeless tobacco. She reports current alcohol use. She reports current drug use. Drug: Marijuana.   Family History: The patient's family history includes CAD (age of onset: 12) in her sister; Cancer (age of onset: 46) in her sister; Dementia in her father; Heart disease in her father; Hypertension in her mother, sister, sister, and sister; Rectal cancer in her sister; Stroke in her father.   Review of Systems: Please see the history of present illness.   All other systems are reviewed and negative.   Physical Exam: VS:  BP 112/66   Pulse 62   Ht 5\' 3"  (1.6 m)   Wt 153 lb 6.4 oz (69.6 kg)   SpO2 95%   BMI 27.17 kg/m  .  BMI Body mass index is 27.17 kg/m.  Wt Readings from Last 3 Encounters:  11/14/22 153 lb 6.4 oz (69.6 kg)  11/07/22 148 lb (67.1 kg)  10/31/22 148 lb 9.6 oz (67.4 kg)    Affect appropriate Healthy:  appears stated age HEENT: normal Neck supple with no adenopathy JVP normal no bruits no thyromegaly Lungs clear with no wheezing and good diaphragmatic motion Heart:  S1/S2 no murmur, no rub, gallop or click PMI normal post sternotomy  Abdomen: benighn, BS positve, no tenderness, no AAA no bruit.  No HSM or HJR Distal pulses intact with no bruits No edema Neuro non-focal Skin warm and dry No muscular weakness    LABORATORY DATA:  EKG:   11/14/2022 SR rate 62 nonspecific ST changes lower atrial foci   Lab Results  Component Value Date   WBC 10.0 11/01/2022   HGB 11.4 (L) 11/01/2022   HCT 34.0 (L) 11/01/2022   PLT 204 11/01/2022   GLUCOSE 104 (H) 11/01/2022   CHOL 125 10/06/2022   TRIG 116.0 10/06/2022   HDL 27.90 (L) 10/06/2022   LDLCALC 74 10/06/2022   ALT 19 10/31/2022   AST 18 10/31/2022   NA 134 (L) 11/01/2022   K 4.5 11/01/2022   CL 102 11/01/2022   CREATININE 1.16 (H) 11/01/2022   BUN 32 (H) 11/01/2022   CO2 27 11/01/2022    TSH 2.28 10/06/2022   INR 1.1 10/31/2022   HGBA1C 6.0 10/06/2022     BNP (last 3 results) No results for input(s): "BNP" in the last 8760 hours.  ProBNP (last 3 results) No results for input(s): "PROBNP" in the last 8760 hours.   Other Studies Reviewed Today:  MYOVIEW 06/24/2020 Narrative & Impression    Findings consistent with prior myocardial infarction with peri-infarct ischemia. The left ventricular ejection fraction is normal (55-65%). Nuclear stress EF: 58%. Horizontal ST segment depression ST segment depression was noted during stress. 1-2  mm This is a low-intermediate risk study.   Small partially reversible perfusion defect in the anteroapex, moderate perfusion defect with stress that returns to mild with rest.  At the mid ventricle there is a mild perfusion defect with stress that returns to normal at rest.  Findings in combination suggest prior infarct with peri-infarct ischemia.  EKG changes were noted with stress, suggestive of ischemia.  Grossly normal wall motion. The study is low to intermediate risk due to small area of ischemia and ECG changes.     Myoview reviewed with Dr. Eden Emms on 11/14/22 - he feels her study is stable with breast attenuation noted - reassuring study.   TEE 06/17/16 Result status: Final result  Left ventricle: Normal cavity size and wall thickness. LV systolic function is low normal with an EF of 50-55%. No thrombus present. No mass present. Septum: No Patent Foramen Ovale present. The interatrial septum bows to the left consistent with high right atrial pressure. Left atrium: Patent foramen ovale not present. Aortic valve: The valve is trileaflet. Mild valve calcification present. No stenosis. No stenosis by continuous wave doppler Trace regurgitation. Mitral valve: Mild leaflet thickening is present. Trace regurgitation. Right ventricle: Normal cavity size and ejection fraction. There is mild hypertrophy. Tricuspid valve: Mild  regurgitation. The tricuspid valve regurgitation jet is central.        Cardiac Catheterization:  06/05/16 Left Heart Cath and Coronary Angiography  Conclusion    Severe calcific, diffuse three-vessel coronary disease. Codominant LAD/diagonal system with severe diffuse disease in each vessel with graftable distal targets. Eccentric 30-60% ostial left main depending upon review. 99% proximal to mid RCA. Total occlusion at the ostium of a large PDA that fills left-to-right collaterals. Moderate mid circumflex disease but without graftable targets beyond the stenosis. Normal left ventricular systolic function with normal hemodynamics     RECOMMENDATIONS:   Continue current medical regimen. Expedient evaluation by [facility] for multivessel coronary bypass grafting.            ASSESSMENT & PLAN:    1. Near syncope/palpitations - in the setting of low potassium Resolved no high risk features continue beta blocker Can arrange monitor if recurs .    2. Known CAD with prior CABG x 3 in 2017 -  myovue 05/03/20 read as ischemia but to my review only breast attenuation no ischemia/infarct low risk   3. HLD - on statin lipitor now with much better control LDL 74  4. HTN - Poorly controlled Aldactone added 07/26/20 improved home readings better      5. PVCs - on beta blocker - would continue. EF has been 50-55%   6. Hypokalemia - resolved K 4.6 10/31/22 HCTZ d/c and on aldactone   Current medicines are reviewed with the patient today.  The patient does not have concerns regarding medicines other than what has been noted above.  The following changes have been made:  See above.  Labs/ tests ordered today include:     Disposition:   FU in  A year     Patient is agreeable to this plan and will call if any problems develop in the interim.   Signed: Charlton Haws, MD  11/14/2022 9:37 AM  [facility] [address] [place], [place]  [address] Phone:  [phone] Fax: [phone]

## 2022-11-07 ENCOUNTER — Ambulatory Visit (INDEPENDENT_AMBULATORY_CARE_PROVIDER_SITE_OTHER): Payer: Medicare HMO

## 2022-11-07 VITALS — Ht 63.0 in | Wt 148.0 lb

## 2022-11-07 DIAGNOSIS — Z0000 Encounter for general adult medical examination without abnormal findings: Secondary | ICD-10-CM | POA: Diagnosis not present

## 2022-11-07 NOTE — Progress Notes (Signed)
Subjective:   Donna Woodward is a 67 y.o. female who presents for Medicare Annual (Subsequent) preventive examination.  Review of Systems    Virtual Visit via Telephone Note  I connected with  Donna Woodward on 11/07/22 at  9:15 AM EDT by telephone and verified that I am speaking with the correct person using two identifiers.  Location: Patient: Home Provider: Office Persons participating in the virtual visit: patient/Nurse Health Advisor   I discussed the limitations, risks, security and privacy concerns of performing an evaluation and management service by telephone and the availability of in person appointments. The patient expressed understanding and agreed to proceed.  Interactive audio and video telecommunications were attempted between this nurse and patient, however failed, due to patient having technical difficulties OR patient did not have access to video capability.  We continued and completed visit with audio only.  Some vital signs may be absent or patient reported.   [name], LPN  Cardiac Risk Factors include: advanced age (>64men, >87 women);hypertension     Objective:    Today's Vitals   11/07/22 0901 11/07/22 0903  Weight: 148 lb (67.1 kg)   Height: 5\' 3"  (1.6 m)   PainSc:  0-No pain   Body mass index is 26.22 kg/m.     10/31/2022    3:00 PM 10/31/2022   11:48 AM 05/13/2022   11:38 PM 11/05/2021    9:34 AM 10/24/2020    9:00 AM 09/27/2020   10:19 AM 02/18/2019    2:41 PM  Advanced Directives  Does Patient Have a Medical Advance Directive? No No No No No No No  Would patient like information on creating a medical advance directive? No - Patient declined Yes (ED - Information included in AVS);Yes (ED - send information to MyChart) No - Patient declined  No - Patient declined Yes (MAU/Ambulatory/Procedural Areas - Information given);No - Patient declined     Current Medications (verified) Outpatient Encounter Medications as of 11/07/2022   Medication Sig   aspirin EC 81 MG tablet Take 1 tablet (81 mg total) by mouth daily.   atorvastatin (LIPITOR) 40 MG tablet Take 1 tablet (40 mg total) by mouth daily.   Calcium Carb-Cholecalciferol (CALCIUM + D3 PO) Take 1 tablet by mouth daily with breakfast.   cyclobenzaprine (FLEXERIL) 5 MG tablet TAKE 1 TO 2 TABLETS AT BEDTIME AS NEEDED FOR MUSCLE SPASM (Patient taking differently: Take 5-10 mg by mouth at bedtime as needed for muscle spasms. TAKE 1 TO 2 TABLETS AT BEDTIME AS NEEDED FOR MUSCLE SPASM)   lisinopril (ZESTRIL) 40 MG tablet Take 1 tablet (40 mg total) by mouth daily.   metoprolol succinate (TOPROL-XL) 100 MG 24 hr tablet TAKE 1 TABLET DAILY. TAKE WITH OR IMMEDIATELY FOLLOWING A MEAL. (Patient taking differently: Take 100 mg by mouth daily. TAKE 1 TABLET DAILY. TAKE WITH OR IMMEDIATELY FOLLOWING A MEAL.)   NIFEdipine (PROCARDIA-XL/NIFEDICAL-XL) 30 MG 24 hr tablet TAKE 1 TABLET EVERY DAY (Patient taking differently: Take 30 mg by mouth daily.)   Omega-3 Fatty Acids (FISH OIL) 1000 MG CAPS Take 1,000 mg by mouth in the morning and at bedtime.    spironolactone (ALDACTONE) 25 MG tablet Take 1 tablet (25 mg total) by mouth daily.   No facility-administered encounter medications on file as of 11/07/2022.    Allergies (verified) Bee venom   History: Past Medical History:  Diagnosis Date   Anxiety    Back pain    Cataract    Coronary artery  disease    CABGx3; Seen by Dr. 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   Depression    GERD (gastroesophageal reflux disease)    if overweight   Hyperlipidemia    Hypertension    Past Surgical History:  Procedure Laterality Date   BACK SURGERY     CARDIAC CATHETERIZATION N/A 06/05/2016   Procedure: Left Heart Cath and Coronary Angiography;  Surgeon: Lyn Records, MD;  Location: [facility] INVASIVE CV LAB;  Service: Cardiovascular;  Laterality: N/A;   CORONARY ARTERY BYPASS GRAFT N/A 06/17/2016   Procedure:  CORONARY ARTERY BYPASS GRAFTING (CABG), ON PUMP, TIMES THREE, USING LEFT INTERNAL MAMMARY ARTERY, RIGHT GREATER SAPHENOUS VEIN HARVESTED ENDOSCOPICALLY;  Surgeon: Kerin Perna, MD;  Location: [facility] OR;  Service: Open Heart Surgery;  Laterality: N/A;   MULTIPLE TOOTH EXTRACTIONS     SPINE SURGERY  1990   lamnectomy L5S1   TEE WITHOUT CARDIOVERSION N/A 06/17/2016   Procedure: TRANSESOPHAGEAL ECHOCARDIOGRAM (TEE);  Surgeon: Kerin Perna, MD;  Location: [facility] OR;  Service: Open Heart Surgery;  Laterality: N/A;   Family History  Problem Relation Age of Onset   Hypertension Mother    Stroke Father    Heart disease Father    Dementia Father    Hypertension Sister    CAD Sister 62       cabg   Rectal cancer Sister    Hypertension Sister    Cancer Sister 19       rectal   Hypertension Sister    Stomach cancer Neg Hx    Colon polyps Neg Hx    Colon cancer Neg Hx    Social History   Socioeconomic History   Marital status: Single    Spouse name: Not on file   Number of children: Not on file   Years of education: 18   Highest education level: Master's degree (e.g., MA, MS, MEng, MEd, MSW, MBA)  Occupational History   Occupation: disabled    Comment: warehouse  Tobacco Use   Smoking status: Former    Types: Cigarettes    Quit date: 12/04/2006    Years since quitting: 15.9   Smokeless tobacco: Never  Vaping Use   Vaping Use: Not on file  Substance and Sexual Activity   Alcohol use: Yes    Comment: occasional   Drug use: Yes    Types: Marijuana    Comment: daily   Sexual activity: Not Currently  Other Topics Concern   Not on file  Social History Narrative   Masters in behavioral health   Live with room mate.   Grew up in [place] and lived here for since 2014.    Disabled with back pain.    Social Determinants of Health   Financial Resource Strain: Low Risk  (11/07/2022)   Overall Financial Resource Strain (CARDIA)    Difficulty of Paying Living Expenses: Not hard at all   Food Insecurity: No Food Insecurity (11/07/2022)   Hunger Vital Sign    Worried About Running Out of Food in the Last Year: Never true    Ran Out of Food in the Last Year: Never true  Transportation Needs: No Transportation Needs (11/07/2022)   PRAPARE - Administrator, Civil Service (Medical): No    Lack of Transportation (Non-Medical): No  Physical Activity: Inactive (11/07/2022)   Exercise Vital Sign    Days of Exercise per Week: 0 days    Minutes of Exercise per Session: 0  min  Stress: No Stress Concern Present (11/07/2022)   [employer] of Occupational Health - Occupational Stress Questionnaire    Feeling of Stress : Not at all  Social Connections: Socially Isolated (11/07/2022)   Social Connection and Isolation Panel [NHANES]    Frequency of Communication with Friends and Family: More than three times a week    Frequency of Social Gatherings with Friends and Family: More than three times a week    Attends Religious Services: Never    Database administrator or Organizations: No    Attends Engineer, structural: Never    Marital Status: Never married    Tobacco Counseling Counseling given: Not Answered   Clinical Intake:  Pre-visit preparation completed: Yes  Pain : No/denies pain Pain Score: 0-No pain     BMI - recorded: 26.22 Nutritional Status: BMI 25 -29 Overweight Nutritional Risks: None Diabetes: No  How often do you need to have someone help you when you read instructions, pamphlets, or other written materials from your doctor or pharmacy?: 1 - Never  Diabetic?  No  Interpreter Needed?: No  Information entered by :: Theresa Mulligan LPN   Activities of Daily Living    11/07/2022    9:08 AM 11/03/2022    9:33 AM  In your present state of health, do you have any difficulty performing the following activities:  Hearing? 0 0  Vision? 0 0  Difficulty concentrating or making decisions? 0 0  Walking or climbing stairs? 1 1  Comment  Uses cane   Dressing or bathing? 0 0  Doing errands, shopping? 0 0  Preparing Food and eating ? N N  Using the Toilet? N N  In the past six months, have you accidently leaked urine? N N  Do you have problems with loss of bowel control? N N  Managing your Medications? N N  Managing your Finances? N N  Housekeeping or managing your Housekeeping? N N    Patient Care Team: Deeann Saint, MD as PCP - General (Family Medicine) Wendall Stade, MD as PCP - Cardiology (Cardiology)  Indicate any recent Medical Services you may have received from other than [facility] providers in the past year (date may be approximate).     Assessment:   This is a routine wellness examination for [place]ry.  Hearing/Vision screen Hearing Screening - Comments:: Denies hearing difficulties   Vision Screening - Comments:: Wears rx glasses - up to date with routine eye exams with  [facility]  Dietary issues and exercise activities discussed: Exercise limited by: None identified   Goals Addressed               This Visit's Progress     No current goals (pt-stated)         Depression Screen    11/07/2022    9:07 AM 10/06/2022    8:48 AM 04/11/2022   11:39 AM 11/05/2021    9:35 AM 10/03/2021    8:47 AM 10/24/2020    8:57 AM 10/20/2019    8:56 AM  PHQ 2/9 Scores  PHQ - 2 Score 0 4 4 4 4 1 2   PHQ- 9 Score 0 14 13 8 12  8     Fall Risk    11/07/2022    9:09 AM 11/03/2022    9:33 AM 10/06/2022    8:48 AM 04/11/2022   11:39 AM 11/05/2021    9:35 AM  Fall Risk   Falls in the past year? 0  0 0 0 0  Number falls in past yr: 0  0 0 0  Injury with Fall? 0  0 0 0  Risk for fall due to : No Fall Risks   No Fall Risks Medication side effect  Follow up Falls prevention discussed  Falls evaluation completed Falls evaluation completed Falls evaluation completed;Education provided;Falls prevention discussed    FALL RISK PREVENTION PERTAINING TO THE HOME:  Any stairs in or around the home? Yes  If so, are there any  without handrails? No  Home free of loose throw rugs in walkways, pet beds, electrical cords, etc? Yes  Adequate lighting in your home to reduce risk of falls? Yes   ASSISTIVE DEVICES UTILIZED TO PREVENT FALLS:  Life alert? No  Use of a cane, walker or w/c? Yes  Grab bars in the bathroom? Yes  Shower chair or bench in shower? Yes Elevated toilet seat or a handicapped toilet? No   TIMED UP AND GO:  Was the test performed? No . Audio Visit   Cognitive Function:        11/07/2022    9:09 AM 11/05/2021    9:46 AM 10/24/2020    9:05 AM  6CIT Screen  What Year? 0 points 0 points 0 points  What month? 0 points 0 points 0 points  What time? 0 points 0 points   Count back from 20 0 points 0 points 0 points  Months in reverse 0 points 0 points 0 points  Repeat phrase 0 points 0 points 0 points  Total Score 0 points 0 points     Immunizations Immunization History  Administered Date(s) Administered   DTaP 09/18/2010   Fluad Quad(high Dose 65+) 06/11/2021, 03/27/2022   Influenza,inj,Quad PF,6+ Mos 05/03/2020   PFIZER(Purple Top)SARS-COV-2 Vaccination 11/12/2019, 12/05/2019, 12/07/2020   PNEUMOCOCCAL CONJUGATE-20 12/07/2020   Tdap 11/06/2017   Zoster Recombinat (Shingrix) 12/07/2020    TDAP status: Up to date  Flu Vaccine status: Up to date  Pneumococcal vaccine status: Up to date  Covid-19 vaccine status: Completed vaccines  Qualifies for Shingles Vaccine? Yes   Zostavax completed Yes   Shingrix Completed?: Yes  Screening Tests Health Maintenance  Topic Date Due   MAMMOGRAM  06/26/2022   COVID-19 Vaccine (4 - 2023-24 season) 11/23/2022 (Originally 02/28/2022)   Zoster Vaccines- Shingrix (2 of 2) 04/07/2023 (Originally 02/01/2021)   INFLUENZA VACCINE  01/29/2023   COLONOSCOPY (Pts 45-58yrs Insurance coverage will need to be confirmed)  04/10/2023   Medicare Annual Wellness (AWV)  11/07/2023   DTaP/Tdap/Td (3 - Td or Tdap) 11/07/2027   Pneumonia Vaccine 55+ Years old   Completed   DEXA SCAN  Completed   Hepatitis C Screening  Completed   HPV VACCINES  Aged Out    Health Maintenance  Health Maintenance Due  Topic Date Due   MAMMOGRAM  06/26/2022    Colorectal cancer screening: Type of screening: Colonoscopy. Completed 04/09/22. Repeat every 1 years  Mammogram status: Ordered 5/30/. Pt provided with contact info and advised to call to schedule appt.   Bone Density status: Completed 05/07/17. Results reflect: Bone density results: OSTEOPOROSIS. Repeat every   years.  Lung Cancer Screening: (Low Dose CT Chest recommended if Age 69-80 years, 30 pack-year currently smoking OR have quit w/in 15years.) does qualify.   Lung Cancer Screening Referral: Deferred  Additional Screening:  Hepatitis C Screening: does qualify; Completed 01/04/18  Vision Screening: Recommended annual ophthalmology exams for early detection of glaucoma and other disorders of the eye. Is  the patient up to date with their annual eye exam?  Yes  Who is the provider or what is the name of the office in which the patient attends annual eye exams? [facility] If pt is not established with a provider, would they like to be referred to a provider to establish care? No .   Dental Screening: Recommended annual dental exams for proper oral hygiene  Community Resource Referral / Chronic Care Management:  CRR required this visit?  No   CCM required this visit?  No      Plan:     I have personally reviewed and noted the following in the patient's chart:   Medical and social history Use of alcohol, tobacco or illicit drugs  Current medications and supplements including opioid prescriptions. Patient is not currently taking opioid prescriptions. Functional ability and status Nutritional status Physical activity Advanced directives List of other physicians Hospitalizations, surgeries, and ER visits in previous 12 months Vitals Screenings to include cognitive, depression, and  falls Referrals and appointments  In addition, I have reviewed and discussed with patient certain preventive protocols, quality metrics, and best practice recommendations. A written personalized care plan for preventive services as well as general preventive health recommendations were provided to patient.     [name], LPN   10/06/8117   Nurse Notes: None

## 2022-11-07 NOTE — Patient Instructions (Addendum)
Donna Woodward , Thank you for taking time to come for your Medicare Wellness Visit. I appreciate your ongoing commitment to your health goals. Please review the following plan we discussed and let me know if I can assist you in the future.   These are the goals we discussed:  Goals       Blood Pressure < 140/90      Eat more fruits and vegetables      Exercise 150 minutes per week (moderate activity)      No current goals (pt-stated)      Patient Stated      Live long life       Patient Stated      11/05/2021, no goals        This is a list of the screening recommended for you and due dates:  Health Maintenance  Topic Date Due   Mammogram  06/26/2022   COVID-19 Vaccine (4 - 2023-24 season) 11/23/2022*   Zoster (Shingles) Vaccine (2 of 2) 04/07/2023*   Flu Shot  01/29/2023   Colon Cancer Screening  04/10/2023   Medicare Annual Wellness Visit  11/07/2023   DTaP/Tdap/Td vaccine (3 - Td or Tdap) 11/07/2027   Pneumonia Vaccine  Completed   DEXA scan (bone density measurement)  Completed   Hepatitis C Screening: USPSTF Recommendation to screen - Ages 104-79 yo.  Completed   HPV Vaccine  Aged Out  *Topic was postponed. The date shown is not the original due date.    Advanced directives: Advance directive discussed with you today. Even though you declined this today, please call our office should you change your mind, and we can give you the proper paperwork for you to fill out.   Conditions/risks identified: None  Next appointment: Follow up in one year for your annual wellness visit     Preventive Care 65 Years and Older, Female Preventive care refers to lifestyle choices and visits with your health care provider that can promote health and wellness. What does preventive care include? A yearly physical exam. This is also called an annual well check. Dental exams once or twice a year. Routine eye exams. Ask your health care provider how often you should have your eyes  checked. Personal lifestyle choices, including: Daily care of your teeth and gums. Regular physical activity. Eating a healthy diet. Avoiding tobacco and drug use. Limiting alcohol use. Practicing safe sex. Taking low-dose aspirin every day. Taking vitamin and mineral supplements as recommended by your health care provider. What happens during an annual well check? The services and screenings done by your health care provider during your annual well check will depend on your age, overall health, lifestyle risk factors, and family history of disease. Counseling  Your health care provider may ask you questions about your: Alcohol use. Tobacco use. Drug use. Emotional well-being. Home and relationship well-being. Sexual activity. Eating habits. History of falls. Memory and ability to understand (cognition). Work and work Astronomer. Reproductive health. Screening  You may have the following tests or measurements: Height, weight, and BMI. Blood pressure. Lipid and cholesterol levels. These may be checked every 5 years, or more frequently if you are over 56 years old. Skin check. Lung cancer screening. You may have this screening every year starting at age 51 if you have a 30-pack-year history of smoking and currently smoke or have quit within the past 15 years. Fecal occult blood test (FOBT) of the stool. You may have this test every year starting at age  50. Flexible sigmoidoscopy or colonoscopy. You may have a sigmoidoscopy every 5 years or a colonoscopy every 10 years starting at age 41. Hepatitis C blood test. Hepatitis B blood test. Sexually transmitted disease (STD) testing. Diabetes screening. This is done by checking your blood sugar (glucose) after you have not eaten for a while (fasting). You may have this done every 1-3 years. Bone density scan. This is done to screen for osteoporosis. You may have this done starting at age 42. Mammogram. This may be done every 1-2  years. Talk to your health care provider about how often you should have regular mammograms. Talk with your health care provider about your test results, treatment options, and if necessary, the need for more tests. Vaccines  Your health care provider may recommend certain vaccines, such as: Influenza vaccine. This is recommended every year. Tetanus, diphtheria, and acellular pertussis (Tdap, Td) vaccine. You may need a Td booster every 10 years. Zoster vaccine. You may need this after age 8. Pneumococcal 13-valent conjugate (PCV13) vaccine. One dose is recommended after age 88. Pneumococcal polysaccharide (PPSV23) vaccine. One dose is recommended after age 75. Talk to your health care provider about which screenings and vaccines you need and how often you need them. This information is not intended to replace advice given to you by your health care provider. Make sure you discuss any questions you have with your health care provider. Document Released: 07/13/2015 Document Revised: 03/05/2016 Document Reviewed: 04/17/2015 Elsevier Interactive Patient Education  2017 [place] Prevention in the Home Falls can cause injuries. They can happen to people of all ages. There are many things you can do to make your home safe and to help prevent falls. What can I do on the outside of my home? Regularly fix the edges of walkways and driveways and fix any cracks. Remove anything that might make you trip as you walk through a door, such as a raised step or threshold. Trim any bushes or trees on the path to your home. Use bright outdoor lighting. Clear any walking paths of anything that might make someone trip, such as rocks or tools. Regularly check to see if handrails are loose or broken. Make sure that both sides of any steps have handrails. Any raised decks and porches should have guardrails on the edges. Have any leaves, snow, or ice cleared regularly. Use sand or salt on walking paths  during winter. Clean up any spills in your garage right away. This includes oil or grease spills. What can I do in the bathroom? Use night lights. Install grab bars by the toilet and in the tub and shower. Do not use towel bars as grab bars. Use non-skid mats or decals in the tub or shower. If you need to sit down in the shower, use a plastic, non-slip stool. Keep the floor dry. Clean up any water that spills on the floor as soon as it happens. Remove soap buildup in the tub or shower regularly. Attach bath mats securely with double-sided non-slip rug tape. Do not have throw rugs and other things on the floor that can make you trip. What can I do in the bedroom? Use night lights. Make sure that you have a light by your bed that is easy to reach. Do not use any sheets or blankets that are too big for your bed. They should not hang down onto the floor. Have a firm chair that has side arms. You can use this for support while you  get dressed. Do not have throw rugs and other things on the floor that can make you trip. What can I do in the kitchen? Clean up any spills right away. Avoid walking on wet floors. Keep items that you use a lot in easy-to-reach places. If you need to reach something above you, use a strong step stool that has a grab bar. Keep electrical cords out of the way. Do not use floor polish or wax that makes floors slippery. If you must use wax, use non-skid floor wax. Do not have throw rugs and other things on the floor that can make you trip. What can I do with my stairs? Do not leave any items on the stairs. Make sure that there are handrails on both sides of the stairs and use them. Fix handrails that are broken or loose. Make sure that handrails are as long as the stairways. Check any carpeting to make sure that it is firmly attached to the stairs. Fix any carpet that is loose or worn. Avoid having throw rugs at the top or bottom of the stairs. If you do have throw  rugs, attach them to the floor with carpet tape. Make sure that you have a light switch at the top of the stairs and the bottom of the stairs. If you do not have them, ask someone to add them for you. What else can I do to help prevent falls? Wear shoes that: Do not have high heels. Have rubber bottoms. Are comfortable and fit you well. Are closed at the toe. Do not wear sandals. If you use a stepladder: Make sure that it is fully opened. Do not climb a closed stepladder. Make sure that both sides of the stepladder are locked into place. Ask someone to hold it for you, if possible. Clearly mark and make sure that you can see: Any grab bars or handrails. First and last steps. Where the edge of each step is. Use tools that help you move around (mobility aids) if they are needed. These include: Canes. Walkers. Scooters. Crutches. Turn on the lights when you go into a dark area. Replace any light bulbs as soon as they burn out. Set up your furniture so you have a clear path. Avoid moving your furniture around. If any of your floors are uneven, fix them. If there are any pets around you, be aware of where they are. Review your medicines with your doctor. Some medicines can make you feel dizzy. This can increase your chance of falling. Ask your doctor what other things that you can do to help prevent falls. This information is not intended to replace advice given to you by your health care provider. Make sure you discuss any questions you have with your health care provider. Document Released: 04/12/2009 Document Revised: 11/22/2015 Document Reviewed: 07/21/2014 Elsevier Interactive Patient Education  2017 Elsevier I

## 2022-11-14 ENCOUNTER — Encounter: Payer: Self-pay | Admitting: Cardiovascular Disease

## 2022-11-14 ENCOUNTER — Ambulatory Visit: Payer: Medicare HMO | Attending: Cardiovascular Disease | Admitting: Cardiovascular Disease

## 2022-11-14 VITALS — BP 112/66 | HR 62 | Ht 63.0 in | Wt 153.4 lb

## 2022-11-14 DIAGNOSIS — E785 Hyperlipidemia, unspecified: Secondary | ICD-10-CM

## 2022-11-14 DIAGNOSIS — I10 Essential (primary) hypertension: Secondary | ICD-10-CM

## 2022-11-14 DIAGNOSIS — I25810 Atherosclerosis of coronary artery bypass graft(s) without angina pectoris: Secondary | ICD-10-CM | POA: Diagnosis not present

## 2022-11-14 NOTE — Patient Instructions (Signed)
Medication Instructions:  Your physician recommends that you continue on your current medications as directed. Please refer to the Current Medication list given to you today.  *If you need a refill on your cardiac medications before your next appointment, please call your pharmacy*   Lab Work: NONE If you have labs (blood work) drawn today and your tests are completely normal, you will receive your results only by: MyChart Message (if you have MyChart) OR A paper copy in the mail If you have any lab test that is abnormal or we need to change your treatment, we will call you to review the results.   Testing/Procedures: NONE   Follow-Up: At [facility], you and your health needs are our priority.  As part of our continuing mission to provide you with exceptional heart care, we have created designated Provider Care Teams.  These Care Teams include your primary Cardiologist (physician) and Advanced Practice Providers (APPs -  Physician Assistants and Nurse Practitioners) who all work together to provide you with the care you need, when you need it.  We recommend signing up for the patient portal called "MyChart".  Sign up information is provided on this After Visit Summary.  MyChart is used to connect with patients for Virtual Visits (Telemedicine).  Patients are able to view lab/test results, encounter notes, upcoming appointments, etc.  Non-urgent messages can be sent to your provider as well.   To learn more about what you can do with MyChart, go to [email or web address].    Your next appointment:   1 year(s)  Provider:   Peter Nishan, MD     

## 2022-11-17 ENCOUNTER — Encounter: Payer: Self-pay | Admitting: Family Medicine

## 2022-11-17 ENCOUNTER — Ambulatory Visit (INDEPENDENT_AMBULATORY_CARE_PROVIDER_SITE_OTHER): Payer: Medicare HMO | Admitting: Family Medicine

## 2022-11-17 VITALS — BP 100/62 | HR 67 | Temp 98.3°F | Wt 152.0 lb

## 2022-11-17 DIAGNOSIS — M65342 Trigger finger, left ring finger: Secondary | ICD-10-CM

## 2022-11-17 DIAGNOSIS — K625 Hemorrhage of anus and rectum: Secondary | ICD-10-CM | POA: Diagnosis not present

## 2022-11-17 DIAGNOSIS — K529 Noninfective gastroenteritis and colitis, unspecified: Secondary | ICD-10-CM | POA: Diagnosis not present

## 2022-11-17 DIAGNOSIS — D72820 Lymphocytosis (symptomatic): Secondary | ICD-10-CM

## 2022-11-17 DIAGNOSIS — N179 Acute kidney failure, unspecified: Secondary | ICD-10-CM

## 2022-11-17 DIAGNOSIS — I7143 Infrarenal abdominal aortic aneurysm, without rupture: Secondary | ICD-10-CM

## 2022-11-17 DIAGNOSIS — I10 Essential (primary) hypertension: Secondary | ICD-10-CM

## 2022-11-17 LAB — CBC WITH DIFFERENTIAL/PLATELET
Basophils Absolute: 0.1 10*3/uL (ref 0.0–0.1)
Basophils Relative: 0.5 % (ref 0.0–3.0)
Eosinophils Absolute: 0.2 10*3/uL (ref 0.0–0.7)
Eosinophils Relative: 2.3 % (ref 0.0–5.0)
HCT: 40.5 % (ref 36.0–46.0)
Hemoglobin: 13.6 g/dL (ref 12.0–15.0)
Lymphocytes Relative: 22.3 % (ref 12.0–46.0)
Lymphs Abs: 2.4 10*3/uL (ref 0.7–4.0)
MCHC: 33.5 g/dL (ref 30.0–36.0)
MCV: 90.6 fl (ref 78.0–100.0)
Monocytes Absolute: 0.6 10*3/uL (ref 0.1–1.0)
Monocytes Relative: 5.6 % (ref 3.0–12.0)
Neutro Abs: 7.5 10*3/uL (ref 1.4–7.7)
Neutrophils Relative %: 69.3 % (ref 43.0–77.0)
Platelets: 286 10*3/uL (ref 150.0–400.0)
RBC: 4.48 Mil/uL (ref 3.87–5.11)
RDW: 15 % (ref 11.5–15.5)
WBC: 10.9 10*3/uL — ABNORMAL HIGH (ref 4.0–10.5)

## 2022-11-17 LAB — BASIC METABOLIC PANEL
BUN: 21 mg/dL (ref 6–23)
CO2: 28 mEq/L (ref 19–32)
Calcium: 9.7 mg/dL (ref 8.4–10.5)
Chloride: 102 mEq/L (ref 96–112)
Creatinine, Ser: 1 mg/dL (ref 0.40–1.20)
GFR: 58.57 mL/min — ABNORMAL LOW (ref >60.00)
Glucose, Bld: 92 mg/dL (ref 70–99)
Potassium: 4.5 mEq/L (ref 3.5–5.1)
Sodium: 139 mEq/L (ref 135–145)

## 2022-11-17 MED ORDER — LISINOPRIL 40 MG PO TABS: 20.0000 mg | ORAL_TABLET | Freq: Every day | ORAL | Status: DC

## 2022-11-17 NOTE — Progress Notes (Signed)
Established Patient Office Visit   Subjective  Patient ID: Donna Woodward, female    DOB: 1956-06-03  Age: 67 y.o. MRN: [id number]  Chief Complaint  Patient presents with   Medical Management of Chronic Issues    No changes in hand cramp, or right knee. Finger was throbbing yesterday.    Patient is a 67 year old female who presents for follow-up.  Patient states she is feeling better s/p GI bug.  Patient was admitted on 5/3-11/01/2022 for observation of colitis with rectal bleeding.  Symptoms managed medically.  Cipro and Flagyl started and discontinued.  Abdominal CT with changes consistent with colitis.  Incidental finding of 3 cm AAA noted on CT.  Patient mentions pain and triggering of left fourth digit.  Requesting referral.  BP well-controlled.  Patient has not checked recently.  Taking lisinopril 40 mg, spironolactone 25 mg, Procardia, Toprol XL.  Denies dizziness, headaches.  Pt canning vegetables and cooking at home daily.    Patient Active Problem List   Diagnosis Date Noted   Colitis with rectal bleeding 10/31/2022   Acute kidney injury superimposed on chronic kidney disease (HCC) 10/31/2022   AAA (abdominal aortic aneurysm) without rupture (HCC) 10/31/2022   Near syncope 05/02/2020   Anxiety 11/06/2017   S/P CABG x 3 06/17/2016   Family history of early CAD 06/04/2016   Chest pain 06/04/2016   Abnormal nuclear stress test 06/04/2016   Degenerative joint disease (DJD) of lumbar spine 03/24/2016   Depression 03/24/2016   Obesity 08/14/2015   Essential hypertension 04/29/2015   Hyperlipidemia 04/29/2015   Past Surgical History:  Procedure Laterality Date   BACK SURGERY     CARDIAC CATHETERIZATION N/A 06/05/2016   Procedure: Left Heart Cath and Coronary Angiography;  Surgeon: Lyn Records, MD;  Location: [facility] INVASIVE CV LAB;  Service: Cardiovascular;  Laterality: N/A;   CORONARY ARTERY BYPASS GRAFT N/A 06/17/2016   Procedure: CORONARY ARTERY BYPASS GRAFTING (CABG),  ON PUMP, TIMES THREE, USING LEFT INTERNAL MAMMARY ARTERY, RIGHT GREATER SAPHENOUS VEIN HARVESTED ENDOSCOPICALLY;  Surgeon: Kerin Perna, MD;  Location: [facility] OR;  Service: Open Heart Surgery;  Laterality: N/A;   MULTIPLE TOOTH EXTRACTIONS     SPINE SURGERY  1990   lamnectomy L5S1   TEE WITHOUT CARDIOVERSION N/A 06/17/2016   Procedure: TRANSESOPHAGEAL ECHOCARDIOGRAM (TEE);  Surgeon: Kerin Perna, MD;  Location: [facility] OR;  Service: Open Heart Surgery;  Laterality: N/A;   Social History   Tobacco Use   Smoking status: Former    Types: Cigarettes    Quit date: 12/04/2006    Years since quitting: 15.9   Smokeless tobacco: Never  Substance Use Topics   Alcohol use: Yes    Comment: occasional   Drug use: Yes    Types: Marijuana    Comment: daily   Family History  Problem Relation Age of Onset   Hypertension Mother    Stroke Father    Heart disease Father    Dementia Father    Hypertension Sister    CAD Sister 28       cabg   Rectal cancer Sister    Hypertension Sister    Cancer Sister 47       rectal   Hypertension Sister    Stomach cancer Neg Hx    Colon polyps Neg Hx    Colon cancer Neg Hx    Allergies  Allergen Reactions   Bee Venom Swelling and Other (See Comments)    Swells where stung- no breathing  issues      ROS Negative unless stated above    Objective:     BP 100/62 (BP Location: Left Arm, Patient Position: Sitting, Cuff Size: Normal)   Pulse 67   Temp 98.3 F (36.8 C) (Oral)   Wt 152 lb (68.9 kg)   SpO2 98%   BMI 26.93 kg/m  BP Readings from Last 3 Encounters:  11/17/22 100/62  11/14/22 112/66  11/01/22 111/60   Wt Readings from Last 3 Encounters:  11/17/22 152 lb (68.9 kg)  11/14/22 153 lb 6.4 oz (69.6 kg)  11/07/22 148 lb (67.1 kg)      Physical Exam Constitutional:      General: She is not in acute distress.    Appearance: Normal appearance.  HENT:     Head: Normocephalic and atraumatic.     Nose: Nose normal.     Mouth/Throat:      Mouth: Mucous membranes are moist.  Cardiovascular:     Rate and Rhythm: Normal rate and regular rhythm.     Heart sounds: Normal heart sounds. No murmur heard.    No gallop.  Pulmonary:     Effort: Pulmonary effort is normal. No respiratory distress.     Breath sounds: Normal breath sounds. No wheezing, rhonchi or rales.  Skin:    General: Skin is warm and dry.  Neurological:     Mental Status: She is alert and oriented to person, place, and time.       Assessment & Plan:  Colitis with rectal bleeding -resolved -CT abdomen with mild wall thickening of the left transverse colon and splenic flexure consistent with nonspecific infectious or inflammatory colitis and presumed source of GI bleeding.  Bilateral small adrenal adenomas and bilateral renal cysts-no follow-up indicated.  3 cm abdominal aortic aneurysm recommend follow-up ultrasound every 3 years. -Outpatient GI follow-up advised.  Essential hypertension -BP well-controlled -Discussed decreasing lisinopril from 40 mg daily to 20 mg daily.  Patient will see if she can split the pills in half.  If unable to we will send in prescription for 20 mg dose. -Continue spironolactone 25 mg daily, Toprol XL 100 mg daily, Procardia 30 mg daily. -Patient encouraged to restart checking BP at home. -     Lisinopril; Take 0.5 tablets (20 mg total) by mouth daily.  Trigger ring finger of left hand -     Ambulatory referral to Orthopedic Surgery  AKI (acute kidney injury) (HCC) -     Basic metabolic panel  Lymphocytosis -     CBC with Differential/Platelet  Infrarenal abdominal aortic aneurysm (AAA) without rupture (HCC) -Incidental finding noted on CT abdomen from 10/31/2022 -3 cm.  Recommend follow-up ultrasound every 3 years   No follow-ups on file.   Deeann Saint, MD

## 2022-11-18 ENCOUNTER — Encounter: Payer: Self-pay | Admitting: Family Medicine

## 2022-11-27 ENCOUNTER — Ambulatory Visit
Admission: RE | Admit: 2022-11-27 | Discharge: 2022-11-27 | Disposition: A | Discharge status: Home / Self Care | Payer: Medicare HMO | Source: Ambulatory Visit | Attending: Family Medicine | Admitting: Family Medicine

## 2022-11-27 DIAGNOSIS — Z1231 Encounter for screening mammogram for malignant neoplasm of breast: Secondary | ICD-10-CM | POA: Diagnosis not present

## 2022-12-05 DIAGNOSIS — M79642 Pain in left hand: Secondary | ICD-10-CM | POA: Diagnosis not present

## 2022-12-14 ENCOUNTER — Encounter: Payer: Self-pay | Admitting: Family Medicine

## 2022-12-19 ENCOUNTER — Other Ambulatory Visit: Payer: Self-pay | Admitting: Family Medicine

## 2022-12-22 ENCOUNTER — Encounter: Payer: Self-pay | Admitting: Family Medicine

## 2022-12-23 ENCOUNTER — Encounter: Payer: Self-pay | Admitting: Family Medicine

## 2022-12-24 ENCOUNTER — Encounter: Payer: Self-pay | Admitting: Family Medicine

## 2023-01-07 ENCOUNTER — Ambulatory Visit (INDEPENDENT_AMBULATORY_CARE_PROVIDER_SITE_OTHER): Payer: Medicare HMO | Admitting: Family Medicine

## 2023-01-07 ENCOUNTER — Encounter: Payer: Self-pay | Admitting: Family Medicine

## 2023-01-07 ENCOUNTER — Other Ambulatory Visit (HOSPITAL_COMMUNITY)
Admission: RE | Admit: 2023-01-07 | Discharge: 2023-01-07 | Disposition: A | Discharge status: Home / Self Care | Payer: Medicare HMO | Source: Ambulatory Visit | Attending: Family Medicine | Admitting: Family Medicine

## 2023-01-07 VITALS — BP 118/62 | HR 65 | Temp 98.6°F | Wt 149.6 lb

## 2023-01-07 DIAGNOSIS — Z1151 Encounter for screening for human papillomavirus (HPV): Secondary | ICD-10-CM | POA: Insufficient documentation

## 2023-01-07 DIAGNOSIS — Z01419 Encounter for gynecological examination (general) (routine) without abnormal findings: Secondary | ICD-10-CM | POA: Insufficient documentation

## 2023-01-07 DIAGNOSIS — Z124 Encounter for screening for malignant neoplasm of cervix: Secondary | ICD-10-CM

## 2023-01-07 DIAGNOSIS — K121 Other forms of stomatitis: Secondary | ICD-10-CM

## 2023-01-07 DIAGNOSIS — M7022 Olecranon bursitis, left elbow: Secondary | ICD-10-CM

## 2023-01-07 NOTE — Progress Notes (Signed)
Established Patient Office Visit   Subjective  Patient ID: Donna Woodward, female    DOB: 07/12/1955  Age: 67 y.o. MRN: [id number]  Chief Complaint  Patient presents with   Mass    Growth above the left tonsil and another in the bottom of mouth on bottom right gum. Left elbow is sore and a little swollen    Patient is a 67 year old female who presents for follow-up on ongoing concern and Pap.  Patient states she had several clear bumps in mouth that then became more red.  All the areas resolved with the exception of 1 on inside of the left cheek.  Area is not painful.  Patient also notes hard toothlike bump in bottom of mouth on right side.  Area is also nonpainful.  Patient wants to get cancer screenings as her sister was recently diagnosed with return of rectal cancer.  Pt had a recent mammogram that was normal.    Past Medical History:  Diagnosis Date   Anxiety    Back pain    Cataract    Coronary artery disease    CABGx3; Seen by Dr. 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   Depression    GERD (gastroesophageal reflux disease)    if overweight   Hyperlipidemia    Hypertension    Past Surgical History:  Procedure Laterality Date   BACK SURGERY     CARDIAC CATHETERIZATION N/A 06/05/2016   Procedure: Left Heart Cath and Coronary Angiography;  Surgeon: Lyn Records, MD;  Location: [facility] INVASIVE CV LAB;  Service: Cardiovascular;  Laterality: N/A;   CORONARY ARTERY BYPASS GRAFT N/A 06/17/2016   Procedure: CORONARY ARTERY BYPASS GRAFTING (CABG), ON PUMP, TIMES THREE, USING LEFT INTERNAL MAMMARY ARTERY, RIGHT GREATER SAPHENOUS VEIN HARVESTED ENDOSCOPICALLY;  Surgeon: Kerin Perna, MD;  Location: [facility] OR;  Service: Open Heart Surgery;  Laterality: N/A;   MULTIPLE TOOTH EXTRACTIONS     SPINE SURGERY  1990   lamnectomy L5S1   TEE WITHOUT CARDIOVERSION N/A 06/17/2016   Procedure: TRANSESOPHAGEAL ECHOCARDIOGRAM (TEE);  Surgeon: Kerin Perna, MD;   Location: [facility] OR;  Service: Open Heart Surgery;  Laterality: N/A;   Social History   Tobacco Use   Smoking status: Former    Types: Cigarettes    Quit date: 12/04/2006    Years since quitting: 16.1   Smokeless tobacco: Never  Substance Use Topics   Alcohol use: Yes    Comment: occasional   Drug use: Yes    Types: Marijuana    Comment: daily   Family History  Problem Relation Age of Onset   Hypertension Mother    Stroke Father    Heart disease Father    Dementia Father    Hypertension Sister    CAD Sister 44       cabg   Rectal cancer Sister    Hypertension Sister    Cancer Sister 61       rectal   Hypertension Sister    Stomach cancer Neg Hx    Colon polyps Neg Hx    Colon cancer Neg Hx    Allergies  Allergen Reactions   Bee Venom Swelling and Other (See Comments)    Swells where stung- no breathing issues      ROS Negative unless stated above    Objective:     BP 118/62 (BP Location: Left Arm, Patient Position: Sitting, Cuff Size: Normal)   Pulse 65   Temp 98.6  F (37 C) (Oral)   Wt 149 lb 9.6 oz (67.9 kg)   SpO2 97%   BMI 26.50 kg/m    Physical Exam Constitutional:      Appearance: Normal appearance.  HENT:     Head: Normocephalic and atraumatic.     Comments: Flesh colored slightly raised oral lesion in mouth L upper cheek, 4 mm.  Lingular torri on R    Nose: Nose normal.     Mouth/Throat:     Mouth: Mucous membranes are moist.  Eyes:     Extraocular Movements: Extraocular movements intact.     Conjunctiva/sclera: Conjunctivae normal.  Cardiovascular:     Rate and Rhythm: Normal rate and regular rhythm.  Pulmonary:     Effort: Pulmonary effort is normal.  Abdominal:     Palpations: Abdomen is soft.  Genitourinary:    Pubic Area: No rash.      Labia:        Right: No rash or lesion.        Left: No rash or lesion.      Urethra: No urethral swelling.     Vagina: Normal.     Cervix: Discharge present. No cervical motion tenderness or  erythema.     Uterus: Normal.      Adnexa: Right adnexa normal and left adnexa normal.     Rectum: Normal.  Musculoskeletal:     Comments: Mild edema of medial left elbow.  No TTP of elbow.  No erythema or induration. Normal ROM.  Neurological:     Mental Status: She is alert.      No results found for any visits on 01/07/23.    Assessment & Plan:  Oral ulceration  Screening for cervical cancer -     Cytology - PAP  Olecranon bursitis of left elbow  Discussed monitoring oral lesion.  If persists or worsens follow-up with ENT for biopsy.  Pap done this visit.  Supportive care for elbow bursitis including ice, stretching, heat, topical analgesics such as Aspercreme, compression.  Return if symptoms worsen or fail to improve.   Deeann Saint, MD

## 2023-01-08 LAB — CYTOLOGY - PAP
Comment: NEGATIVE
Diagnosis: NEGATIVE
High risk HPV: NEGATIVE

## 2023-01-12 ENCOUNTER — Encounter: Payer: Self-pay | Admitting: Gastroenterology

## 2023-01-12 ENCOUNTER — Ambulatory Visit: Payer: Medicare HMO | Admitting: Gastroenterology

## 2023-01-12 VITALS — BP 116/62 | HR 64 | Ht 64.0 in | Wt 147.0 lb

## 2023-01-12 DIAGNOSIS — Z86010 Personal history of colonic polyps: Secondary | ICD-10-CM | POA: Diagnosis not present

## 2023-01-12 MED ORDER — NA SULFATE-K SULFATE-MG SULF 17.5-3.13-1.6 GM/177ML PO SOLN: 1.0000 | Freq: Once | ORAL | 0 refills | Status: AC

## 2023-01-12 NOTE — Patient Instructions (Signed)
You have been scheduled for a colonoscopy. Please follow written instructions given to you at your visit today.   Please pick up your prep supplies at the pharmacy within the next 1-3 days.  If you use inhalers (even only as needed), please bring them with you on the day of your procedure.  DO NOT TAKE 7 DAYS PRIOR TO TEST- Trulicity (dulaglutide) Ozempic, Wegovy (semaglutide) Mounjaro (tirzepatide) Bydureon Bcise (exanatide extended release)  DO NOT TAKE 1 DAY PRIOR TO YOUR TEST Rybelsus (semaglutide) Adlyxin (lixisenatide) Victoza (liraglutide) Byetta (exanatide) _________________________________________________________________  If your blood pressure at your visit was 140/90 or greater, please contact your primary care physician to follow up on this.  _______________________________________________________  If you are age 30 or older, your body mass index should be between 23-30. Your Body mass index is 25.23 kg/m. If this is out of the aforementioned range listed, please consider follow up with your Primary Care Provider.  If you are age 32 or younger, your body mass index should be between 19-25. Your Body mass index is 25.23 kg/m. If this is out of the aformentioned range listed, please consider follow up with your Primary Care Provider.   ________________________________________________________  The  GI providers would like to encourage you to use [facility] to communicate with providers for non-urgent requests or questions.  Due to long hold times on the telephone, sending your provider a message by [facility] may be a faster and more efficient way to get a response.  Please allow 48 business hours for a response.  Please remember that this is for non-urgent requests.  _______________________________________________________

## 2023-01-12 NOTE — Progress Notes (Signed)
01/12/2023 Donna Woodward [id number] [birth year]   HISTORY OF PRESENT ILLNESS:  This is a 67 year old female who is a patient of Dr. Elana Alm.  She had an 18 mm colon polyp removed that was a tubular adenoma in 03/2022 with repeat recommended in 1 year.    She is here today for follow-up from a one day hospital stay at the beginning of May.  Had sudden onset of vomiting, diarrhea, abdominal pain.  Also had some small volume bright red rectal bleeding at that time.  CT scan of the abdomen and pelvis with contrast showed mild wall thickening of the left transverse colon and splenic flexure c/w infectious of inflammatory colitis.  She suspects food poisoning, so likely was infectious colitis.  Symptoms resolved within a day.  No further issues since that time.  Past Medical History:  Diagnosis Date   Anxiety    Back pain    Cataract    Coronary artery disease    CABGx3; Seen by Dr. 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   Depression    GERD (gastroesophageal reflux disease)    if overweight   Hyperlipidemia    Hypertension    Past Surgical History:  Procedure Laterality Date   BACK SURGERY     CARDIAC CATHETERIZATION N/A 06/05/2016   Procedure: Left Heart Cath and Coronary Angiography;  Surgeon: Lyn Records, MD;  Location: [facility] INVASIVE CV LAB;  Service: Cardiovascular;  Laterality: N/A;   CORONARY ARTERY BYPASS GRAFT N/A 06/17/2016   Procedure: CORONARY ARTERY BYPASS GRAFTING (CABG), ON PUMP, TIMES THREE, USING LEFT INTERNAL MAMMARY ARTERY, RIGHT GREATER SAPHENOUS VEIN HARVESTED ENDOSCOPICALLY;  Surgeon: Kerin Perna, MD;  Location: [facility] OR;  Service: Open Heart Surgery;  Laterality: N/A;   MULTIPLE TOOTH EXTRACTIONS     SPINE SURGERY  1990   lamnectomy L5S1   TEE WITHOUT CARDIOVERSION N/A 06/17/2016   Procedure: TRANSESOPHAGEAL ECHOCARDIOGRAM (TEE);  Surgeon: Kerin Perna, MD;  Location: [facility] OR;  Service: Open Heart Surgery;  Laterality: N/A;     reports that she quit smoking about 16 years ago. Her smoking use included cigarettes. She has never used smokeless tobacco. She reports current alcohol use. She reports current drug use. Drug: Marijuana. family history includes CAD (age of onset: 5) in her sister; Cancer (age of onset: 64) in her sister; Dementia in her father; Heart disease in her father; Hypertension in her mother, sister, sister, and sister; Rectal cancer in her sister; Stroke in her father. Allergies  Allergen Reactions   Bee Venom Swelling and Other (See Comments)    Swells where stung- no breathing issues      Outpatient Encounter Medications as of 01/12/2023  Medication Sig   aspirin EC 81 MG tablet Take 1 tablet (81 mg total) by mouth daily.   atorvastatin (LIPITOR) 40 MG tablet Take 1 tablet (40 mg total) by mouth daily.   Calcium Carb-Cholecalciferol (CALCIUM + D3 PO) Take 1 tablet by mouth daily with breakfast.   Cyanocobalamin (VITAMIN B 12 PO) Take 1,000 m by mouth daily.   cyclobenzaprine (FLEXERIL) 5 MG tablet TAKE 1 TO 2 TABLETS AT BEDTIME AS NEEDED FOR MUSCLE SPASM(S)   metoprolol succinate (TOPROL-XL) 100 MG 24 hr tablet TAKE 1 TABLET DAILY. TAKE WITH OR IMMEDIATELY FOLLOWING A MEAL. (Patient taking differently: Take 100 mg by mouth daily. TAKE 1 TABLET DAILY. TAKE WITH OR IMMEDIATELY FOLLOWING A MEAL.)   NIFEdipine (PROCARDIA-XL/NIFEDICAL-XL) 30 MG 24  hr tablet TAKE 1 TABLET EVERY DAY (Patient taking differently: Take 30 mg by mouth daily.)   Omega-3 Fatty Acids (FISH OIL) 1000 MG CAPS Take 1,000 mg by mouth in the morning and at bedtime.    spironolactone (ALDACTONE) 25 MG tablet Take 1 tablet (25 mg total) by mouth daily.   [DISCONTINUED] lisinopril (ZESTRIL) 40 MG tablet Take 0.5 tablets (20 mg total) by mouth daily.   No facility-administered encounter medications on file as of 01/12/2023.    REVIEW OF SYSTEMS  : All other systems reviewed and negative except where noted in the History of Present  Illness.   PHYSICAL EXAM: BP 116/62   Pulse 64   Ht 5\' 4"  (1.626 m)   Wt 147 lb (66.7 kg)   BMI 25.23 kg/m  General: Well developed white female in no acute distress Head: Normocephalic and atraumatic Eyes:  Sclerae anicteric, conjunctiva pink. Ears: Normal auditory acuity Lungs: Clear throughout to auscultation; no W/R/R. Heart: Regular rate and rhythm; no M/R/G. Abdomen: Soft, non-distended.  BS present.  Non-tender. Rectal:  Will be done at the time of colonoscopy. Musculoskeletal: Symmetrical with no gross deformities  Skin: No lesions on visible extremities Extremities: No edema  Neurological: Alert oriented x 4, grossly non-focal Psychological:  Alert and cooperative. Normal mood and affect  ASSESSMENT AND PLAN: *Personal history of colon polyps:  Had an 18 mm colon polyp removed that was a tubular adenoma in 03/2022 with repeat recommended in 1 year.  Will schedule for colonoscopy with Dr. Lavon Paganini for September/October.  The risks, benefits, and alternatives to colonoscopy were discussed with the patient and she consents to proceed.  *Colitis seen on CT scan in May:  Had sudden onset of vomiting, diarrhea, and abdominal pain.  Also had some small volume bright red rectal bleeding at that time.  She suspects food poisoning, so likely was infectious colitis.  Symptoms resolved within a day.  CC:  Deeann Saint, MD

## 2023-01-30 ENCOUNTER — Other Ambulatory Visit: Payer: Self-pay | Admitting: Cardiovascular Disease

## 2023-01-30 DIAGNOSIS — I10 Essential (primary) hypertension: Secondary | ICD-10-CM

## 2023-03-12 ENCOUNTER — Other Ambulatory Visit: Payer: Self-pay | Admitting: Family Medicine

## 2023-03-17 ENCOUNTER — Other Ambulatory Visit: Payer: Self-pay | Admitting: Cardiovascular Disease

## 2023-03-19 ENCOUNTER — Other Ambulatory Visit: Payer: Self-pay | Admitting: Family Medicine

## 2023-03-19 DIAGNOSIS — I10 Essential (primary) hypertension: Secondary | ICD-10-CM

## 2023-03-22 ENCOUNTER — Encounter: Payer: Self-pay | Admitting: Certified Registered Nurse Anesthetist

## 2023-03-27 ENCOUNTER — Ambulatory Visit (AMBULATORY_SURGERY_CENTER): Payer: Medicare HMO | Admitting: Gastroenterology

## 2023-03-27 ENCOUNTER — Encounter: Payer: Self-pay | Admitting: Gastroenterology

## 2023-03-27 VITALS — BP 115/62 | HR 53 | Temp 98.0°F | Resp 16 | Ht 64.0 in | Wt 147.0 lb

## 2023-03-27 DIAGNOSIS — K635 Polyp of colon: Secondary | ICD-10-CM

## 2023-03-27 DIAGNOSIS — D125 Benign neoplasm of sigmoid colon: Secondary | ICD-10-CM

## 2023-03-27 DIAGNOSIS — Z86010 Personal history of colonic polyps: Secondary | ICD-10-CM

## 2023-03-27 DIAGNOSIS — Z09 Encounter for follow-up examination after completed treatment for conditions other than malignant neoplasm: Secondary | ICD-10-CM | POA: Diagnosis not present

## 2023-03-27 DIAGNOSIS — I2510 Atherosclerotic heart disease of native coronary artery without angina pectoris: Secondary | ICD-10-CM | POA: Diagnosis not present

## 2023-03-27 DIAGNOSIS — I10 Essential (primary) hypertension: Secondary | ICD-10-CM | POA: Diagnosis not present

## 2023-03-27 MED ORDER — SODIUM CHLORIDE 0.9 % IV SOLN: 500.0000 mL | INTRAVENOUS | Status: DC

## 2023-03-27 NOTE — Op Note (Signed)
[facility] Patient Name: Donna Woodward Procedure Date: 03/27/2023 9:19 AM MRN: [id number] Endoscopist: Napoleon Form , MD, [id number] Age: 67 Referring MD:  Date of Birth: [birth year] Gender: Female Account #: [id number] Procedure:                Colonoscopy Indications:              Screening in patient at increased risk: Colorectal                            cancer in sister before age 17, High risk colon                            cancer surveillance: Personal history of adenoma                            (10 mm or greater in size), High risk colon cancer                            surveillance: Personal history of adenoma with high                            grade dysplasia Medicines:                Monitored Anesthesia Care Procedure:                Pre-Anesthesia Assessment:                           - Prior to the procedure, a History and Physical                            was performed, and patient medications and                            allergies were reviewed. The patient's tolerance of                            previous anesthesia was also reviewed. The risks                            and benefits of the procedure and the sedation                            options and risks were discussed with the patient.                            All questions were answered, and informed consent                            was obtained. Prior Anticoagulants: The patient has                            taken no anticoagulant or antiplatelet agents. ASA  Grade Assessment: III - A patient with severe                            systemic disease. After reviewing the risks and                            benefits, the patient was deemed in satisfactory                            condition to undergo the procedure.                           After obtaining informed consent, the colonoscope                            was passed under direct vision.  Throughout the                            procedure, the patient's blood pressure, pulse, and                            oxygen saturations were monitored continuously. The                            [id number]-HQ190L Colonoscope [id number] was introduced                            through the anus and advanced to the the cecum,                            identified by appendiceal orifice and ileocecal                            valve. The colonoscopy was performed without                            difficulty. The patient tolerated the procedure                            well. The quality of the bowel preparation was                            good. The ileocecal valve, appendiceal orifice, and                            rectum were photographed. Scope In: 9:35:35 AM Scope Out: 9:50:11 AM Scope Withdrawal Time: 0 hours 9 minutes 36 seconds  Total Procedure Duration: 0 hours 14 minutes 36 seconds  Findings:                 The perianal and digital rectal examinations were                            normal.  A 4 mm polyp was found in the sigmoid colon. The                            polyp was sessile. The polyp was removed with a                            cold snare. Resection and retrieval were complete.                           A few small-mouthed diverticula were found in the                            sigmoid colon.                           Non-bleeding external and internal hemorrhoids were                            found during retroflexion. The hemorrhoids were                            medium-sized. Complications:            No immediate complications. Estimated Blood Loss:     Estimated blood loss was minimal. Impression:               - One 4 mm polyp in the sigmoid colon, removed with                            a cold snare. Resected and retrieved.                           - Diverticulosis in the sigmoid colon.                           - Non-bleeding  external and internal hemorrhoids. Recommendation:           - Patient has a contact number available for                            emergencies. The signs and symptoms of potential                            delayed complications were discussed with the                            patient. Return to normal activities tomorrow.                            Written discharge instructions were provided to the                            patient.                           - Resume previous diet.                           -  Continue present medications.                           - Await pathology results.                           - Repeat colonoscopy in 5 years for surveillance. Napoleon Form, MD 03/27/2023 9:54:53 AM This report has been signed electronically.

## 2023-03-27 NOTE — Progress Notes (Signed)
[facility] History and Physical   Primary Care Physician:  Deeann Saint, MD   Reason for Procedure:  History of adenomatous colon polyps, family h/o colorectal cancer in sister at age 67  Plan:    Surveillance colonoscopy with possible interventions as needed     HPI: Donna Woodward is a very pleasant 67 y.o. female here for surveillance colonoscopy history of adenomatous colon polyps, family h/o colorectal cancer in sister at age 1.   The risks and benefits as well as alternatives of endoscopic procedure(s) have been discussed and reviewed. All questions answered. The patient agrees to proceed.    Past Medical History:  Diagnosis Date   Anxiety    Back pain    Cataract    Coronary artery disease    CABGx3; Seen by Dr. 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   Depression    GERD (gastroesophageal reflux disease)    if overweight   Hyperlipidemia    Hypertension     Past Surgical History:  Procedure Laterality Date   BACK SURGERY     CARDIAC CATHETERIZATION N/A 06/05/2016   Procedure: Left Heart Cath and Coronary Angiography;  Surgeon: Lyn Records, MD;  Location: [facility] INVASIVE CV LAB;  Service: Cardiovascular;  Laterality: N/A;   COLONOSCOPY     CORONARY ARTERY BYPASS GRAFT N/A 06/17/2016   Procedure: CORONARY ARTERY BYPASS GRAFTING (CABG), ON PUMP, TIMES THREE, USING LEFT INTERNAL MAMMARY ARTERY, RIGHT GREATER SAPHENOUS VEIN HARVESTED ENDOSCOPICALLY;  Surgeon: Kerin Perna, MD;  Location: [facility] OR;  Service: Open Heart Surgery;  Laterality: N/A;   MULTIPLE TOOTH EXTRACTIONS     SPINE SURGERY  1990   lamnectomy L5S1   TEE WITHOUT CARDIOVERSION N/A 06/17/2016   Procedure: TRANSESOPHAGEAL ECHOCARDIOGRAM (TEE);  Surgeon: Kerin Perna, MD;  Location: [facility] OR;  Service: Open Heart Surgery;  Laterality: N/A;    Prior to Admission medications   Medication Sig Start Date End Date Taking? Authorizing Provider  aspirin EC 81  MG tablet Take 1 tablet (81 mg total) by mouth daily. 11/06/17  Yes Hagler, Fleet Contras, MD  atorvastatin (LIPITOR) 40 MG tablet Take 1 tablet (40 mg total) by mouth daily. 11/04/22  Yes Deeann Saint, MD  Calcium Carb-Cholecalciferol (CALCIUM + D3 PO) Take 1 tablet by mouth daily with breakfast.   Yes [provider]  Cyanocobalamin (VITAMIN B 12 PO) Take 1,000 m by mouth daily.   Yes [provider]  cyclobenzaprine (FLEXERIL) 5 MG tablet TAKE 1 TO 2 TABLETS AT BEDTIME AS NEEDED FOR MUSCLE SPASM(S) 03/13/23  Yes Deeann Saint, MD  metoprolol succinate (TOPROL-XL) 100 MG 24 hr tablet TAKE 1 TABLET DAILY. TAKE WITH OR IMMEDIATELY FOLLOWING A MEAL. 03/17/23  Yes Wendall Stade, MD  NIFEdipine (PROCARDIA-XL/NIFEDICAL-XL) 30 MG 24 hr tablet TAKE 1 TABLET EVERY DAY 03/20/23  Yes Deeann Saint, MD  Omega-3 Fatty Acids (FISH OIL) 1000 MG CAPS Take 1,000 mg by mouth in the morning and at bedtime.    Yes [provider]  spironolactone (ALDACTONE) 25 MG tablet TAKE 1 TABLET EVERY DAY 01/30/23  Yes Wendall Stade, MD    Current Outpatient Medications  Medication Sig Dispense Refill   aspirin EC 81 MG tablet Take 1 tablet (81 mg total) by mouth daily.     atorvastatin (LIPITOR) 40 MG tablet Take 1 tablet (40 mg total) by mouth daily. 90 tablet 3   Calcium Carb-Cholecalciferol (CALCIUM + D3 PO) Take  1 tablet by mouth daily with breakfast.     Cyanocobalamin (VITAMIN B 12 PO) Take 1,000 m by mouth daily.     cyclobenzaprine (FLEXERIL) 5 MG tablet TAKE 1 TO 2 TABLETS AT BEDTIME AS NEEDED FOR MUSCLE SPASM(S) 90 tablet 1   metoprolol succinate (TOPROL-XL) 100 MG 24 hr tablet TAKE 1 TABLET DAILY. TAKE WITH OR IMMEDIATELY FOLLOWING A MEAL. 90 tablet 3   NIFEdipine (PROCARDIA-XL/NIFEDICAL-XL) 30 MG 24 hr tablet TAKE 1 TABLET EVERY DAY 90 tablet 3   Omega-3 Fatty Acids (FISH OIL) 1000 MG CAPS Take 1,000 mg by mouth in the morning and at bedtime.      spironolactone (ALDACTONE) 25 MG tablet  TAKE 1 TABLET EVERY DAY 90 tablet 2   Current Facility-Administered Medications  Medication Dose Route Frequency Provider Last Rate Last Admin   0.9 %  sodium chloride infusion  500 mL Intravenous Continuous Pau Banh, Eleonore Chiquito, MD        Allergies as of 03/27/2023 - Review Complete 03/27/2023  Allergen Reaction Noted   Bee venom Swelling and Other (See Comments) 02/09/2020    Family History  Problem Relation Age of Onset   Hypertension Mother    Stroke Father    Heart disease Father    Dementia Father    Hypertension Sister    CAD Sister 55       cabg   Rectal cancer Sister    Hypertension Sister    Cancer Sister 52       rectal   Hypertension Sister    Stomach cancer Neg Hx    Colon polyps Neg Hx    Colon cancer Neg Hx     Social History   Socioeconomic History   Marital status: Single    Spouse name: Not on file   Number of children: Not on file   Years of education: 18   Highest education level: Master's degree (e.g., MA, MS, MEng, MEd, MSW, MBA)  Occupational History   Occupation: disabled    Comment: warehouse  Tobacco Use   Smoking status: Former    Current packs/day: 0.00    Types: Cigarettes    Quit date: 12/04/2006    Years since quitting: 16.3   Smokeless tobacco: Never  Vaping Use   Vaping status: Not on file  Substance and Sexual Activity   Alcohol use: Yes    Comment: occasional   Drug use: Yes    Types: Marijuana    Comment: daily   Sexual activity: Not Currently  Other Topics Concern   Not on file  Social History Narrative   Masters in behavioral health   Live with room mate.   Grew up in [place] and lived here for since 2014.    Disabled with back pain.    Social Determinants of Health   Financial Resource Strain: Low Risk  (11/07/2022)   Overall Financial Resource Strain (CARDIA)    Difficulty of Paying Living Expenses: Not hard at all  Food Insecurity: No Food Insecurity (11/07/2022)   Hunger Vital Sign    Worried About  Running Out of Food in the Last Year: Never true    Ran Out of Food in the Last Year: Never true  Transportation Needs: No Transportation Needs (11/07/2022)   PRAPARE - Administrator, Civil Service (Medical): No    Lack of Transportation (Non-Medical): No  Physical Activity: Inactive (11/07/2022)   Exercise Vital Sign    Days of Exercise per Week: 0 days  Minutes of Exercise per Session: 0 min  Stress: No Stress Concern Present (11/07/2022)   [employer] of Occupational Health - Occupational Stress Questionnaire    Feeling of Stress : Not at all  Social Connections: Socially Isolated (11/07/2022)   Social Connection and Isolation Panel [NHANES]    Frequency of Communication with Friends and Family: More than three times a week    Frequency of Social Gatherings with Friends and Family: More than three times a week    Attends Religious Services: Never    Database administrator or Organizations: No    Attends Banker Meetings: Never    Marital Status: Never married  Intimate Partner Violence: Not At Risk (11/07/2022)   Humiliation, Afraid, Rape, and Kick questionnaire    Fear of Current or Ex-Partner: No    Emotionally Abused: No    Physically Abused: No    Sexually Abused: No    Review of Systems:  All other review of systems negative except as mentioned in the HPI.  Physical Exam: Vital signs in last 24 hours: BP 139/80   Pulse 60   Temp 98 F (36.7 C)   Resp (!) 24   Ht 5\' 4"  (1.626 m)   Wt 147 lb (66.7 kg)   SpO2 98%   BMI 25.23 kg/m  General:   Alert, NAD Lungs:  Clear .   Heart:  Regular rate and rhythm Abdomen:  Soft, nontender and nondistended. Neuro/Psych:  Alert and cooperative. Normal mood and affect. A and O x 3  Reviewed labs, radiology imaging, old records and pertinent past GI work up  Patient is appropriate for planned procedure(s) and anesthesia in an ambulatory setting   K. Scherry Ran , MD [phone]

## 2023-03-27 NOTE — Progress Notes (Signed)
Called to room to assist during endoscopic procedure.  Patient ID and intended procedure confirmed with present staff. Received instructions for my participation in the procedure from the performing physician.  

## 2023-03-27 NOTE — Progress Notes (Signed)
Report given to PACU, vss 

## 2023-03-27 NOTE — Patient Instructions (Addendum)
Resume previous diet. Continue present medications. Await pathology results. Repeat colonoscopy in 5 years for surveillance.  YOU HAD AN ENDOSCOPIC PROCEDURE TODAY AT [facility]:   Refer to the procedure report that was given to you for any specific questions about what was found during the examination.  If the procedure report does not answer your questions, please call your gastroenterologist to clarify.  If you requested that your care partner not be given the details of your procedure findings, then the procedure report has been included in a sealed envelope for you to review at your convenience later.  YOU SHOULD EXPECT: Some feelings of bloating in the abdomen. Passage of more gas than usual.  Walking can help get rid of the air that was put into your GI tract during the procedure and reduce the bloating. If you had a lower endoscopy (such as a colonoscopy or flexible sigmoidoscopy) you may notice spotting of blood in your stool or on the toilet paper. If you underwent a bowel prep for your procedure, you may not have a normal bowel movement for a few days.  Please Note:  You might notice some irritation and congestion in your nose or some drainage.  This is from the oxygen used during your procedure.  There is no need for concern and it should clear up in a day or so.  SYMPTOMS TO REPORT IMMEDIATELY:  Following lower endoscopy (colonoscopy or flexible sigmoidoscopy):  Excessive amounts of blood in the stool  Significant tenderness or worsening of abdominal pains  Swelling of the abdomen that is new, acute  Fever of 100F or higher   For urgent or emergent issues, a gastroenterologist can be reached at any hour by calling [phone]. Do not use MyChart messaging for urgent concerns.    DIET:  We do recommend a small meal at first, but then you may proceed to your regular diet.  Drink plenty of fluids but you should avoid alcoholic beverages for 24  hours.  ACTIVITY:  You should plan to take it easy for the rest of today and you should NOT DRIVE or use heavy machinery until tomorrow (because of the sedation medicines used during the test).    FOLLOW UP: Our staff will call the number listed on your records the next business day following your procedure.  We will call around 7:15- 8:00 am to check on you and address any questions or concerns that you may have regarding the information given to you following your procedure. If we do not reach you, we will leave a message.     If any biopsies were taken you will be contacted by phone or by letter within the next 1-3 weeks.  Please call us at [phone] if you have not heard about the biopsies in 3 weeks.    SIGNATURES/CONFIDENTIALITY: You and/or your care partner have signed paperwork which will be entered into your electronic medical record.  These signatures attest to the fact that that the information above on your After Visit Summary has been reviewed and is understood.  Full responsibility of the confidentiality of this discharge information lies with you and/or your care-partner.

## 2023-03-28 ENCOUNTER — Encounter: Payer: Self-pay | Admitting: Family Medicine

## 2023-03-30 ENCOUNTER — Telehealth: Payer: Self-pay

## 2023-03-30 NOTE — Telephone Encounter (Signed)
Left message on follow up call. 

## 2023-03-31 LAB — SURGICAL PATHOLOGY

## 2023-03-31 NOTE — Progress Notes (Unsigned)
Family history added

## 2023-04-01 ENCOUNTER — Encounter: Payer: Self-pay | Admitting: Gastroenterology

## 2023-05-27 ENCOUNTER — Other Ambulatory Visit: Payer: Self-pay | Admitting: Family Medicine

## 2023-06-10 ENCOUNTER — Other Ambulatory Visit (HOSPITAL_BASED_OUTPATIENT_CLINIC_OR_DEPARTMENT_OTHER): Payer: Self-pay

## 2023-06-10 ENCOUNTER — Ambulatory Visit (INDEPENDENT_AMBULATORY_CARE_PROVIDER_SITE_OTHER): Payer: Medicare HMO

## 2023-06-10 DIAGNOSIS — Z23 Encounter for immunization: Secondary | ICD-10-CM | POA: Diagnosis not present

## 2023-06-10 MED ORDER — COVID-19 MRNA VAC-TRIS(PFIZER) 30 MCG/0.3ML IM SUSY
0.3000 mL | PREFILLED_SYRINGE | Freq: Once | INTRAMUSCULAR | 0 refills | Status: AC
  Filled 2023-06-10: qty 0.3, 1d supply, fill #0

## 2023-06-10 NOTE — Progress Notes (Signed)
Per orders of Dr. Salomon Fick , injection of Influenza High dose  given by Stann Ore. Patient tolerated injection well.

## 2023-06-17 ENCOUNTER — Other Ambulatory Visit (HOSPITAL_BASED_OUTPATIENT_CLINIC_OR_DEPARTMENT_OTHER): Payer: Self-pay

## 2023-06-17 MED ORDER — ZOSTER VAC RECOMB ADJUVANTED 50 MCG/0.5ML IM SUSR
0.5000 mL | Freq: Once | INTRAMUSCULAR | 0 refills | Status: AC
  Filled 2023-06-17: qty 0.5, 1d supply, fill #0

## 2023-08-18 ENCOUNTER — Other Ambulatory Visit: Payer: Self-pay | Admitting: Family Medicine

## 2023-08-29 ENCOUNTER — Other Ambulatory Visit: Payer: Self-pay | Admitting: Family Medicine

## 2023-08-29 ENCOUNTER — Other Ambulatory Visit: Payer: Self-pay | Admitting: Cardiovascular Disease

## 2023-08-29 DIAGNOSIS — I10 Essential (primary) hypertension: Secondary | ICD-10-CM

## 2023-09-24 ENCOUNTER — Encounter: Payer: Self-pay | Admitting: Family Medicine

## 2023-10-07 ENCOUNTER — Encounter: Payer: Medicare HMO | Admitting: Family Medicine

## 2023-10-07 DIAGNOSIS — N179 Acute kidney failure, unspecified: Secondary | ICD-10-CM

## 2023-10-07 DIAGNOSIS — R7303 Prediabetes: Secondary | ICD-10-CM

## 2023-10-07 DIAGNOSIS — I10 Essential (primary) hypertension: Secondary | ICD-10-CM

## 2023-10-07 DIAGNOSIS — Z0000 Encounter for general adult medical examination without abnormal findings: Secondary | ICD-10-CM

## 2023-10-15 ENCOUNTER — Encounter: Payer: Self-pay | Admitting: Family Medicine

## 2023-10-15 ENCOUNTER — Ambulatory Visit: Admitting: Family Medicine

## 2023-10-15 VITALS — BP 158/90 | HR 77 | Temp 97.6°F | Wt 153.6 lb

## 2023-10-15 DIAGNOSIS — R220 Localized swelling, mass and lump, head: Secondary | ICD-10-CM

## 2023-10-15 DIAGNOSIS — J392 Other diseases of pharynx: Secondary | ICD-10-CM

## 2023-10-15 DIAGNOSIS — Z13820 Encounter for screening for osteoporosis: Secondary | ICD-10-CM

## 2023-10-15 DIAGNOSIS — H6991 Unspecified Eustachian tube disorder, right ear: Secondary | ICD-10-CM

## 2023-10-15 DIAGNOSIS — Z0000 Encounter for general adult medical examination without abnormal findings: Secondary | ICD-10-CM | POA: Diagnosis not present

## 2023-10-15 DIAGNOSIS — I10 Essential (primary) hypertension: Secondary | ICD-10-CM | POA: Diagnosis not present

## 2023-10-15 DIAGNOSIS — E785 Hyperlipidemia, unspecified: Secondary | ICD-10-CM

## 2023-10-15 DIAGNOSIS — M25511 Pain in right shoulder: Secondary | ICD-10-CM

## 2023-10-15 LAB — CBC WITH DIFFERENTIAL/PLATELET
Basophils Absolute: 0 10*3/uL (ref 0.0–0.1)
Basophils Relative: 0.5 % (ref 0.0–3.0)
Eosinophils Absolute: 0.1 10*3/uL (ref 0.0–0.7)
Eosinophils Relative: 1.5 % (ref 0.0–5.0)
HCT: 45.5 % (ref 36.0–46.0)
Hemoglobin: 15.1 g/dL — ABNORMAL HIGH (ref 12.0–15.0)
Lymphocytes Relative: 22.1 % (ref 12.0–46.0)
Lymphs Abs: 2 10*3/uL (ref 0.7–4.0)
MCHC: 33.2 g/dL (ref 30.0–36.0)
MCV: 89.8 fl (ref 78.0–100.0)
Monocytes Absolute: 0.5 10*3/uL (ref 0.1–1.0)
Monocytes Relative: 5.8 % (ref 3.0–12.0)
Neutro Abs: 6.4 10*3/uL (ref 1.4–7.7)
Neutrophils Relative %: 70.1 % (ref 43.0–77.0)
Platelets: 201 10*3/uL (ref 150.0–400.0)
RBC: 5.07 Mil/uL (ref 3.87–5.11)
RDW: 15.6 % — ABNORMAL HIGH (ref 11.5–15.5)
WBC: 9.1 10*3/uL (ref 4.0–10.5)

## 2023-10-15 LAB — LIPID PANEL
Cholesterol: 117 mg/dL (ref 0–200)
HDL: 36.8 mg/dL — ABNORMAL LOW (ref >39.00)
LDL Cholesterol: 62 mg/dL (ref 0–99)
NonHDL: 80.25
Total CHOL/HDL Ratio: 3
Triglycerides: 89 mg/dL (ref 0.0–149.0)
VLDL: 17.8 mg/dL (ref 0.0–40.0)

## 2023-10-15 LAB — COMPREHENSIVE METABOLIC PANEL WITH GFR
ALT: 34 U/L (ref 0–35)
AST: 24 U/L (ref 0–37)
Albumin: 4.6 g/dL (ref 3.5–5.2)
Alkaline Phosphatase: 54 U/L (ref 39–117)
BUN: 22 mg/dL (ref 6–23)
CO2: 31 meq/L (ref 19–32)
Calcium: 9.5 mg/dL (ref 8.4–10.5)
Chloride: 102 meq/L (ref 96–112)
Creatinine, Ser: 0.81 mg/dL (ref 0.40–1.20)
GFR: 74.94 mL/min (ref >60.00)
Glucose, Bld: 101 mg/dL — ABNORMAL HIGH (ref 70–99)
Potassium: 4 meq/L (ref 3.5–5.1)
Sodium: 139 meq/L (ref 135–145)
Total Bilirubin: 0.6 mg/dL (ref 0.2–1.2)
Total Protein: 7.1 g/dL (ref 6.0–8.3)

## 2023-10-15 LAB — TSH: TSH: 3.41 u[IU]/mL (ref 0.35–5.50)

## 2023-10-15 LAB — HEMOGLOBIN A1C: Hgb A1c MFr Bld: 6 % (ref 4.6–6.5)

## 2023-10-15 LAB — T4, FREE: Free T4: 0.71 ng/dL (ref 0.60–1.60)

## 2023-10-15 LAB — VITAMIN B12: Vitamin B-12: 1275 pg/mL — ABNORMAL HIGH (ref 211–911)

## 2023-10-15 NOTE — Patient Instructions (Addendum)
 I put in an order for bone density scan to screen for osteoporosis.  Seen she last had 1 done on 05/07/2017.  They are typically done every 2 years.  They will call you about setting up this appointment.  The abdominal aortic aneurysm seen on CT from 10/31/2022 is not due to be rechecked until 2027 by abdominal ultrasound per recommendations from the radiologist.  We will schedule this when it is time.  Your last Pap was done in 2024 and is not due until 2029 unless you are having problems before then.

## 2023-10-15 NOTE — Progress Notes (Signed)
 Established Patient Office Visit   Subjective  Patient ID: Donna Woodward, female    DOB: Dec 11, 1955  Age: 68 y.o. MRN: [id number]  Chief Complaint  Patient presents with   Annual Exam    Patient is a 68 year old female seen for CPE and follow-up.  Patient states she is doing well overall.  Injured right shoulder 2 weeks ago after stretching.  Heard a popping sound.  Feels like muscle in bottom of right upper arm has a pulling sensation with certain movements.  Decreased range of motion overhead due to pain.  Patient thinks nodule in roof of mouth has increased slightly in size.  Area is nonpainful, does not bleed.  Seen at [facility].  Moles on upper chest and left neck unchanged per patient.  Checking BP at home.  Has logs of readings, 107-1 30s/70s-80.    Patient Active Problem List   Diagnosis Date Noted   History of colonic polyps 01/12/2023   Colitis with rectal bleeding 10/31/2022   Acute kidney injury superimposed on chronic kidney disease (HCC) 10/31/2022   AAA (abdominal aortic aneurysm) without rupture (HCC) 10/31/2022   Near syncope 05/02/2020   Anxiety 11/06/2017   S/P CABG x 3 06/17/2016   Family history of early CAD 06/04/2016   Chest pain 06/04/2016   Abnormal nuclear stress test 06/04/2016   Degenerative joint disease (DJD) of lumbar spine 03/24/2016   Depression 03/24/2016   Obesity 08/14/2015   Essential hypertension 04/29/2015   Hyperlipidemia 04/29/2015   Past Medical History:  Diagnosis Date   Anxiety    Back pain    Cataract    Coronary artery disease    CABGx3; Seen by Dr. Stann Earnest   Degenerative joint disease (DJD) of lumbar spine 03/24/2016   Depression    GERD (gastroesophageal reflux disease)    if overweight   Hyperlipidemia    Hypertension    Past Surgical History:  Procedure Laterality Date   BACK SURGERY     CARDIAC CATHETERIZATION N/A 06/05/2016   Procedure: Left Heart Cath and Coronary Angiography;  Surgeon: Arty Binning, MD;   Location: [facility] INVASIVE CV LAB;  Service: Cardiovascular;  Laterality: N/A;   COLONOSCOPY     CORONARY ARTERY BYPASS GRAFT N/A 06/17/2016   Procedure: CORONARY ARTERY BYPASS GRAFTING (CABG), ON PUMP, TIMES THREE, USING LEFT INTERNAL MAMMARY ARTERY, RIGHT GREATER SAPHENOUS VEIN HARVESTED ENDOSCOPICALLY;  Surgeon: Heriberto London, MD;  Location: [facility] OR;  Service: Open Heart Surgery;  Laterality: N/A;   MULTIPLE TOOTH EXTRACTIONS     SPINE SURGERY  1990   lamnectomy L5S1   TEE WITHOUT CARDIOVERSION N/A 06/17/2016   Procedure: TRANSESOPHAGEAL ECHOCARDIOGRAM (TEE);  Surgeon: Heriberto London, MD;  Location: [facility] OR;  Service: Open Heart Surgery;  Laterality: N/A;   Social History   Tobacco Use   Smoking status: Former    Current packs/day: 0.00    Types: Cigarettes    Quit date: 12/04/2006    Years since quitting: 16.8   Smokeless tobacco: Never  Substance Use Topics   Alcohol use: Yes    Comment: occasional   Drug use: Yes    Types: Marijuana    Comment: daily   Family History  Problem Relation Age of Onset   Hypertension Mother    Stroke Father    Heart disease Father    Dementia Father    Hypertension Sister    CAD Sister 83       cabg   Rectal cancer Sister  Hypertension Sister    Cancer Sister 30       rectal   Cancer - Cervical Sister        CYST ON CERVIX  APRIL 2024   Cancer - Other Sister        lymph node stage 3 cancer sept 2024   Hypertension Sister    Stomach cancer Neg Hx    Colon polyps Neg Hx    Colon cancer Neg Hx    Allergies  Allergen Reactions   Bee Venom Swelling and Other (See Comments)    Swells where stung- no breathing issues      ROS Negative unless stated above    Objective:     BP (!) 158/90 (BP Location: Left Arm, Patient Position: Sitting, Cuff Size: Normal)   Pulse 77   Temp 97.6 F (36.4 C) (Oral)   Wt 153 lb 9.6 oz (69.7 kg)   SpO2 99%   BMI 26.37 kg/m  BP Readings from Last 3 Encounters:  10/15/23 (!) 158/90  03/27/23  115/62  01/12/23 116/62   Wt Readings from Last 3 Encounters:  10/15/23 153 lb 9.6 oz (69.7 kg)  03/27/23 147 lb (66.7 kg)  01/12/23 147 lb (66.7 kg)     Physical Exam Constitutional:      Appearance: Normal appearance.  HENT:     Head: Normocephalic and atraumatic.     Right Ear: Tympanic membrane, ear canal and external ear normal.     Left Ear: Tympanic membrane, ear canal and external ear normal.     Nose: Nose normal.     Mouth/Throat:     Mouth: Mucous membranes are moist.     Pharynx: No oropharyngeal exudate or posterior oropharyngeal erythema.      Comments: Nodule of left sided soft palate. Eyes:     General: No scleral icterus.    Extraocular Movements: Extraocular movements intact.     Conjunctiva/sclera: Conjunctivae normal.     Pupils: Pupils are equal, round, and reactive to light.  Neck:     Thyroid: No thyromegaly.  Cardiovascular:     Rate and Rhythm: Normal rate and regular rhythm.     Pulses: Normal pulses.     Heart sounds: Normal heart sounds. No murmur heard.    No friction rub.  Pulmonary:     Effort: Pulmonary effort is normal.     Breath sounds: Normal breath sounds. No wheezing, rhonchi or rales.  Abdominal:     General: Bowel sounds are normal.     Palpations: Abdomen is soft.     Tenderness: There is no abdominal tenderness.  Musculoskeletal:        General: No deformity. Normal range of motion.  Lymphadenopathy:     Cervical: No cervical adenopathy.  Skin:    General: Skin is warm and dry.     Findings: No lesion.  Neurological:     General: No focal deficit present.     Mental Status: She is alert and oriented to person, place, and time.  Psychiatric:        Mood and Affect: Mood normal.        Thought Content: Thought content normal.      No results found for any visits on 10/15/23.    Assessment & Plan:  Well adult exam -     Comprehensive metabolic panel with GFR -     CBC with Differential/Platelet -     TSH -      T4, free -  Hemoglobin A1c -     Lipid panel  Acute pain of right shoulder  Nodule of soft palate -     Ambulatory referral to Oral Maxillofacial Surgery  Throat irritation -     Vitamin B12 -     CBC with Differential/Platelet -     TSH -     T4, free  Screening for osteoporosis -     DG Bone Density; Future  Dysfunction of right eustachian tube  Essential hypertension -     Comprehensive metabolic panel with GFR -     TSH -     T4, free  Hyperlipidemia, unspecified hyperlipidemia type -     Lipid panel  Age-appropriate health screenings discussed.  Obtain labs.  Immunizations reviewed.  Patient to schedule mammogram, last done 11/27/2022.  Pap up-to-date done 01/08/2023, repeat 2029.  Colonoscopy done 03/27/2023.  DEXA scan ordered.  Referral to oral maxillofacial surgery for biopsy/removal of nodule in roof of mouth.  Recheck BP.  Continue current medications including spironolactone 25 mg daily, Procardia XL 30 mg daily, Toprol-XL 100 mg daily.  Continue supportive care for right shoulder.  For continued symptoms refer to Ortho.  Return if symptoms worsen or fail to improve.   Viola Greulich, MD

## 2023-10-16 ENCOUNTER — Encounter: Payer: Self-pay | Admitting: Family Medicine

## 2023-10-30 ENCOUNTER — Encounter: Payer: Self-pay | Admitting: Family Medicine

## 2023-11-03 ENCOUNTER — Other Ambulatory Visit: Payer: Self-pay | Admitting: Family Medicine

## 2023-11-03 DIAGNOSIS — Z0000 Encounter for general adult medical examination without abnormal findings: Secondary | ICD-10-CM

## 2023-11-04 ENCOUNTER — Ambulatory Visit: Payer: Medicare HMO | Admitting: Cardiovascular Disease

## 2023-11-05 ENCOUNTER — Encounter: Payer: Self-pay | Admitting: Nurse Practitioner

## 2023-11-05 ENCOUNTER — Ambulatory Visit: Payer: Medicare HMO | Attending: Nurse Practitioner | Admitting: Nurse Practitioner

## 2023-11-05 ENCOUNTER — Telehealth: Payer: Self-pay | Admitting: Family Medicine

## 2023-11-05 VITALS — BP 130/80 | HR 52 | Ht 63.0 in | Wt 152.4 lb

## 2023-11-05 DIAGNOSIS — R002 Palpitations: Secondary | ICD-10-CM | POA: Diagnosis not present

## 2023-11-05 DIAGNOSIS — I2510 Atherosclerotic heart disease of native coronary artery without angina pectoris: Secondary | ICD-10-CM | POA: Diagnosis not present

## 2023-11-05 DIAGNOSIS — E785 Hyperlipidemia, unspecified: Secondary | ICD-10-CM | POA: Diagnosis not present

## 2023-11-05 DIAGNOSIS — I10 Essential (primary) hypertension: Secondary | ICD-10-CM

## 2023-11-05 DIAGNOSIS — I6523 Occlusion and stenosis of bilateral carotid arteries: Secondary | ICD-10-CM

## 2023-11-05 DIAGNOSIS — I493 Ventricular premature depolarization: Secondary | ICD-10-CM

## 2023-11-05 NOTE — Patient Instructions (Signed)
 Medication Instructions:  Your physician recommends that you continue on your current medications as directed. Please refer to the Current Medication list given to you today.  *If you need a refill on your cardiac medications before your next appointment, please call your pharmacy*  Lab Work: NONE ordered at this time of appointment   Testing/Procedures: NONE ordered at this time of appointment    Follow-Up: At [facility], you and your health needs are our priority.  As part of our continuing mission to provide you with exceptional heart care, our providers are all part of one team.  This team includes your primary Cardiologist (physician) and Advanced Practice Providers or APPs (Physician Assistants and Nurse Practitioners) who all work together to provide you with the care you need, when you need it.  Your next appointment:   1 year(s)  Provider:   Janelle Mediate, MD    We recommend signing up for the patient portal called "MyChart".  Sign up information is provided on this After Visit Summary.  MyChart is used to connect with patients for Virtual Visits (Telemedicine).  Patients are able to view lab/test results, encounter notes, upcoming appointments, etc.  Non-urgent messages can be sent to your provider as well.   To learn more about what you can do with MyChart, go to [email or web address].   Other Instructions

## 2023-11-05 NOTE — Progress Notes (Signed)
 Office Visit    Patient Name: Donna Woodward Date of Encounter: 11/05/2023  Primary Care Provider:  Viola Greulich, MD Primary Cardiologist:  Janelle Mediate, MD  Chief Complaint    68 year old female with a history of CAD s/p CABG x 3 (LIMA-LAD, SVG-PDA, SVG-Diagonal) in 2017, PVCs, mild carotid artery stenosis, hypertension, and hyperlipidemia who presents for follow-up related to CAD.  Past Medical History    Past Medical History:  Diagnosis Date   Anxiety    Back pain    Cataract    Coronary artery disease    CABGx3; Seen by Dr. Stann Earnest   Degenerative joint disease (DJD) of lumbar spine 03/24/2016   Depression    GERD (gastroesophageal reflux disease)    if overweight   Hyperlipidemia    Hypertension    Past Surgical History:  Procedure Laterality Date   BACK SURGERY     CARDIAC CATHETERIZATION N/A 06/05/2016   Procedure: Left Heart Cath and Coronary Angiography;  Surgeon: Arty Binning, MD;  Location: [facility] INVASIVE CV LAB;  Service: Cardiovascular;  Laterality: N/A;   COLONOSCOPY     CORONARY ARTERY BYPASS GRAFT N/A 06/17/2016   Procedure: CORONARY ARTERY BYPASS GRAFTING (CABG), ON PUMP, TIMES THREE, USING LEFT INTERNAL MAMMARY ARTERY, RIGHT GREATER SAPHENOUS VEIN HARVESTED ENDOSCOPICALLY;  Surgeon: Heriberto London, MD;  Location: [facility] OR;  Service: Open Heart Surgery;  Laterality: N/A;   MULTIPLE TOOTH EXTRACTIONS     SPINE SURGERY  1990   lamnectomy L5S1   TEE WITHOUT CARDIOVERSION N/A 06/17/2016   Procedure: TRANSESOPHAGEAL ECHOCARDIOGRAM (TEE);  Surgeon: Heriberto London, MD;  Location: [facility] OR;  Service: Open Heart Surgery;  Laterality: N/A;    Allergies  Allergies  Allergen Reactions   Bee Venom Swelling and Other (See Comments)    Swells where stung- no breathing issues     Labs/Other Studies Reviewed    The following studies were reviewed today:  Cardiac Studies & Procedures    ______________________________________________________________________________________________ CARDIAC CATHETERIZATION  CARDIAC CATHETERIZATION 06/05/2016  Conclusion  Severe calcific, diffuse three-vessel coronary disease.  Codominant LAD/diagonal system with severe diffuse disease in each vessel with graftable distal targets.  Eccentric 30-60% ostial left main depending upon review.  99% proximal to mid RCA. Total occlusion at the ostium of a large PDA that fills left-to-right collaterals.  Moderate mid circumflex disease but without graftable targets beyond the stenosis.  Normal left ventricular systolic function with normal hemodynamics   RECOMMENDATIONS:   Continue current medical regimen.  Expedient evaluation by [facility]CTS for multivessel coronary bypass grafting.  Findings Coronary Findings Diagnostic  Dominance: Right  Left Main  Left Anterior Descending  First Diagonal Branch Vessel is small in size.  Lateral First Diagonal Branch Vessel is small in size.  Third Septal Branch Vessel is small in size.  Ramus Intermedius Vessel is small.  Left Circumflex  Third Obtuse Marginal Branch Vessel is small in size.  Right Coronary Artery  Right Posterior Descending Artery  Inferior Septal Collaterals Inf Sept filled by collaterals from 1st Sept.  Right Posterior Atrioventricular Artery Vessel is small in size.  Second Right Posterolateral Branch Vessel is small in size.  Third Right Posterolateral Branch Vessel is small in size.  Intervention  No interventions have been documented.   STRESS TESTS  NM MYOCAR MULTI W/SPECT W 05/03/2020  Narrative  Findings consistent with prior myocardial infarction with peri-infarct ischemia.  The left ventricular ejection fraction is normal (55-65%).  Nuclear stress EF: 58%.  Horizontal ST  segment depression ST segment depression was noted during stress. 1-2 mm  This is a low-intermediate risk  study.  Small partially reversible perfusion defect in the anteroapex, moderate perfusion defect with stress that returns to mild with rest.  At the mid ventricle there is a mild perfusion defect with stress that returns to normal at rest.  Findings in combination suggest prior infarct with peri-infarct ischemia.  EKG changes were noted with stress, suggestive of ischemia.  Grossly normal wall motion. The study is low to intermediate risk due to small area of ischemia and ECG changes.     TEE  ECHO TEE 06/17/2016  Interpretation Summary  Left ventricle: Normal cavity size and wall thickness. LV systolic function is low normal with an EF of 50-55%. No thrombus present. No mass present.  Septum: No Patent Foramen Ovale present. The interatrial septum bows to the left consistent with high right atrial pressure.  Left atrium: Patent foramen ovale not present.  Aortic valve: The valve is trileaflet. Mild valve calcification present. No stenosis. No stenosis by continuous wave doppler Trace regurgitation.  Mitral valve: Mild leaflet thickening is present. Trace regurgitation.  Right ventricle: Normal cavity size and ejection fraction. There is mild hypertrophy.  Tricuspid valve: Mild regurgitation. The tricuspid valve regurgitation jet is central.        ______________________________________________________________________________________________     Recent Labs: 10/15/2023: ALT 34; BUN 22; Creatinine, Ser 0.81; Hemoglobin 15.1; Platelets 201.0; Potassium 4.0; Sodium 139; TSH 3.41  Recent Lipid Panel    Component Value Date/Time   CHOL 117 10/15/2023 1057   CHOL 132 04/01/2022 0732   TRIG 89.0 10/15/2023 1057   HDL 36.80 (L) 10/15/2023 1057   HDL 25 (L) 04/01/2022 0732   CHOLHDL 3 10/15/2023 1057   VLDL 17.8 10/15/2023 1057   LDLCALC 62 10/15/2023 1057   LDLCALC 79 04/01/2022 0732   LDLCALC 81 01/04/2018 1038    History of Present Illness    68 year old female with the  above past medical history including CAD s/p CABG x 3 (LIMA-LAD, SVG-PDA, SVG-Diagonal) in 2017, PVCs, mild carotid artery stenosis, hypertension, and hyperlipidemia.  She has a history of CAD s/p prior CABG x 3 in 2017, EF 50 to 55% at the time.  Pre-CABG Dopplers revealed 1 to 39% B ICA stenosis.  Myoview  in 04/2020 was read as isch emia, but was felt to be related to breast attenuation, overall low risk.  She has a history of palpitations, PVCs, on metoprolol .  She has a history of near syncope in 2021, this occurred in the setting of hypokalemia.  She was last seen in the office on 11/14/2022 and was stable from a cardiac standpoint.  She denied symptoms concerning for angina.  She presents today for follow-up.  Since her last visit she has been stable overall from a cardiac standpoint. She reports rare fleeting chest discomfort, no exertional symptoms. She denies any other symptoms concerning for angina. Overall, she reports feeling well.    Home Medications    Current Outpatient Medications  Medication Sig Dispense Refill   aspirin  EC 81 MG tablet Take 1 tablet (81 mg total) by mouth daily.     atorvastatin  (LIPITOR) 40 MG tablet TAKE 1 TABLET EVERY DAY 90 tablet 3   Calcium  Carb-Cholecalciferol (CALCIUM  + D3 PO) Take 1 tablet by mouth daily with breakfast.     cyclobenzaprine  (FLEXERIL ) 5 MG tablet TAKE 1 TO 2 TABLETS AT BEDTIME AS NEEDED FOR MUSCLE SPASM(S) 90 tablet 1   metoprolol   succinate (TOPROL -XL) 100 MG 24 hr tablet TAKE 1 TABLET DAILY. TAKE WITH OR IMMEDIATELY FOLLOWING A MEAL. 90 tablet 3   NIFEdipine  (PROCARDIA -XL/NIFEDICAL-XL) 30 MG 24 hr tablet TAKE 1 TABLET EVERY DAY 90 tablet 3   Omega-3 Fatty Acids (FISH OIL ) 1000 MG CAPS Take 1,000 mg by mouth in the morning and at bedtime.      spironolactone  (ALDACTONE ) 25 MG tablet Take 1 tablet (25 mg total) by mouth daily. Pt needs to keep upcoming appt in May for further refills 90 tablet 0   No current facility-administered  medications for this visit.     Review of Systems    She denies chest pain, palpitations, dyspnea, pnd, orthopnea, n, v, dizziness, syncope, edema, weight gain, or early satiety. All other systems reviewed and are otherwise negative except as noted above.   Physical Exam    VS:  BP 130/80   Pulse (!) 52   Ht 5\' 3"  (1.6 m)   Wt 152 lb 6.4 oz (69.1 kg)   SpO2 93%   BMI 27.00 kg/m   GEN: Well nourished, well developed, in no acute distress. HEENT: normal. Neck: Supple, no JVD, carotid bruits, or masses. Cardiac: RRR, no murmurs, rubs, or gallops. No clubbing, cyanosis, edema.  Radials/DP/PT 2+ and equal bilaterally.  Respiratory:  Respirations regular and unlabored, clear to auscultation bilaterally. GI: Soft, nontender, nondistended, BS + x 4. MS: no deformity or atrophy. Skin: warm and dry, no rash. Neuro:  Strength and sensation are intact. Psych: Normal affect.  Accessory Clinical Findings    ECG personally reviewed by me today - EKG Interpretation Date/Time:  Thursday Nov 05 2023 10:33:33 EDT Ventricular Rate:  52 PR Interval:  112 QRS Duration:  80 QT Interval:  418 QTC Calculation: 388 R Axis:   87  Text Interpretation: Sinus bradycardia Nonspecific ST abnormality When compared with ECG of 02-May-2020 16:08, ST elevation now present in Inferior leads Nonspecific T wave abnormality no longer evident in Inferior leads T wave inversion less evident in Anterolateral leads QT has shortened Confirmed by Marlana Silvan ([id number]) on 11/05/2023 11:06:43 AM  - no acute changes.   Lab Results  Component Value Date   WBC 9.1 10/15/2023   HGB 15.1 (H) 10/15/2023   HCT 45.5 10/15/2023   MCV 89.8 10/15/2023   PLT 201.0 10/15/2023   Lab Results  Component Value Date   CREATININE 0.81 10/15/2023   BUN 22 10/15/2023   NA 139 10/15/2023   K 4.0 10/15/2023   CL 102 10/15/2023   CO2 31 10/15/2023   Lab Results  Component Value Date   ALT 34 10/15/2023   AST 24 10/15/2023    ALKPHOS 54 10/15/2023   BILITOT 0.6 10/15/2023   Lab Results  Component Value Date   CHOL 117 10/15/2023   HDL 36.80 (L) 10/15/2023   LDLCALC 62 10/15/2023   TRIG 89.0 10/15/2023   CHOLHDL 3 10/15/2023    Lab Results  Component Value Date   HGBA1C 6.0 10/15/2023    Assessment & Plan    1. CAD:  S/p CABG x 3 sees LIMA-LAD, SVG-PDA, SVG-Diagonal) in 2017. Stable  with no anginal symptoms. No indication for ischemic evaluation.  Continue aspirin , metoprolol , nifedipine , spironolactone , and Lipitor.  2. Palpitations/PVCs: Denies any recent palpitations.  Continue metoprolol .  3. Hypertension: BP well controlled. Continue current antihypertensive regimen.   4. Hyperlipidemia: LDL was 62 in 09/2023. Continue Lipitor.   5. Disposition: Follow-up in 1 year, sooner if needed.  Jude Norton, NP 11/05/2023, 1:37 PM

## 2023-11-05 NOTE — Telephone Encounter (Signed)
 Copied from CRM [id number]. Topic: Referral - Question >> Nov 05, 2023 11:39 AM Juluis Ok wrote: Reason for CRM: Patient states that a copy of referral and records need to be resent to [place] [facility].

## 2023-11-08 ENCOUNTER — Other Ambulatory Visit: Payer: Self-pay | Admitting: Family Medicine

## 2023-11-09 ENCOUNTER — Ambulatory Visit (INDEPENDENT_AMBULATORY_CARE_PROVIDER_SITE_OTHER)
Admission: RE | Admit: 2023-11-09 | Discharge: 2023-11-09 | Disposition: A | Discharge status: Home / Self Care | Source: Ambulatory Visit | Attending: Family Medicine | Admitting: Family Medicine

## 2023-11-09 DIAGNOSIS — Z13820 Encounter for screening for osteoporosis: Secondary | ICD-10-CM | POA: Diagnosis not present

## 2023-11-16 ENCOUNTER — Ambulatory Visit: Payer: Self-pay | Admitting: Family Medicine

## 2023-11-16 ENCOUNTER — Telehealth: Payer: Self-pay

## 2023-11-16 NOTE — Telephone Encounter (Signed)
 Unsuccessful attempts to reach patient on preferred number listed in notes for scheduled AWV. Left message on voicemail okay to reschedule.

## 2023-11-30 ENCOUNTER — Ambulatory Visit
Admission: RE | Admit: 2023-11-30 | Discharge: 2023-11-30 | Disposition: A | Discharge status: Home / Self Care | Source: Ambulatory Visit | Attending: Family Medicine | Admitting: Family Medicine

## 2023-11-30 DIAGNOSIS — Z0000 Encounter for general adult medical examination without abnormal findings: Secondary | ICD-10-CM

## 2023-11-30 DIAGNOSIS — Z1231 Encounter for screening mammogram for malignant neoplasm of breast: Secondary | ICD-10-CM | POA: Diagnosis not present

## 2023-12-01 ENCOUNTER — Other Ambulatory Visit: Payer: Self-pay | Admitting: Cardiovascular Disease

## 2023-12-01 DIAGNOSIS — I10 Essential (primary) hypertension: Secondary | ICD-10-CM

## 2023-12-03 ENCOUNTER — Ambulatory Visit: Admitting: Family Medicine

## 2023-12-03 DIAGNOSIS — Z0000 Encounter for general adult medical examination without abnormal findings: Secondary | ICD-10-CM | POA: Diagnosis not present

## 2023-12-03 NOTE — Patient Instructions (Signed)
 I really enjoyed getting to talk with you today! I am available on Tuesdays and Thursdays for virtual visits if you have any questions or concerns, or if I can be of any further assistance.   CHECKLIST FROM ANNUAL WELLNESS VISIT:  -Follow up (please call to schedule if not scheduled after visit):   -yearly for annual wellness visit with primary care office  Here is a list of your preventive care/health maintenance measures and the plan for each if any are due:  PLAN For any measures below that may be due:   Health Maintenance  Topic Date Due   COVID-19 Vaccine (5 - 2024-25 season) 12/09/2023   INFLUENZA VACCINE  01/29/2024   Medicare Annual Wellness (AWV)  12/02/2024   MAMMOGRAM  11/29/2025   DTaP/Tdap/Td (3 - Td or Tdap) 11/07/2027   Colonoscopy  03/26/2028   Pneumonia Vaccine 80+ Years old  Completed   DEXA SCAN  Completed   Hepatitis C Screening  Completed   Zoster Vaccines- Shingrix   Completed   HPV VACCINES  Aged Out   Meningococcal B Vaccine  Aged Out    -See a dentist at least yearly  -Get your eyes checked and then per your eye specialist's recommendations  -Other issues addressed today:   -I have included below further information regarding a healthy whole foods based diet, physical activity guidelines for adults, stress management and opportunities for social connections. I hope you find this information useful.   -----------------------------------------------------------------------------------------------------------------------------------------------------------------------------------------------------------------------------------------------------------    NUTRITION: -eat real food: lots of colorful vegetables (half the plate) and fruits -5-7 servings of vegetables and fruits per day (fresh or steamed is best), exp. 2 servings of vegetables with lunch and dinner and 2 servings of fruit per day. Berries and greens such as kale and collards are great  choices.  -consume on a regular basis:  fresh fruits, fresh veggies, fish, nuts, seeds, healthy oils (such as olive oil, avocado oil), whole grains (make sure for bread/pasta/crackers/etc., that the first ingredient on label contains the word "whole"), legumes. -can eat small amounts of dairy and lean meat (no larger than the palm of your hand), but avoid processed meats such as ham, bacon, lunch meat, etc. -drink water -try to avoid fast food and pre-packaged foods, processed meat, ultra processed foods/beverages (donuts, candy, etc.) -most experts advise limiting sodium to < 2300mg  per day, should limit further is any chronic conditions such as high blood pressure, heart disease, diabetes, etc. The American Heart Association advised that < 1500mg  is is ideal -try to avoid foods/beverages that contain any ingredients with names you do not recognize  -try to avoid foods/beverages  with added sugar or sweeteners/sweets  -try to avoid sweet drinks (including diet drinks): soda, juice, Gatorade, sweet tea, power drinks, diet drinks -try to avoid white rice, white bread, pasta (unless whole grain)  EXERCISE GUIDELINES FOR ADULTS: -if you wish to increase your physical activity, do so gradually and with the approval of your doctor -STOP and seek medical care immediately if you have any chest pain, chest discomfort or trouble breathing when starting or increasing exercise  -move and stretch your body, legs, feet and arms when sitting for long periods -Physical activity guidelines for optimal health in adults: -get at least 150 minutes per week of moderate exercise (can talk, but not sing); this is about 20-30 minutes of sustained activity 5-7 days per week or two 10-15 minute episodes of sustained activity 5-7 days per week -do some muscle building/resistance training/strength training at least  2 days per week  -balance exercises 3+ days per week:   Stand somewhere where you have something sturdy to  hold onto if you lose balance    1) lift up on toes, then back down, start with 5x per day and work up to 20x   2) stand and lift one leg straight out to the side so that foot is a few inches of the floor, start with 5x each side and work up to 20x each side   3) stand on one foot, start with 5 seconds each side and work up to 20 seconds on each side  If you need ideas or help with getting more active:  -Silver sneakers https://tools.silversneakers.com  -Walk with a Doc: http://www.duncan-[email or web address]/  -try to include resistance (weight lifting/strength building) and balance exercises twice per week: or the following link for ideas: http://castillo-powell.com/  BuyDucts.dk  STRESS MANAGEMENT: -can try meditating, or just sitting quietly with deep breathing while intentionally relaxing all parts of your body for 5 minutes daily -if you need further help with stress, anxiety or depression please follow up with your primary doctor or contact the wonderful folks at [facility]: [phone]  SOCIAL CONNECTIONS: -options in [place] if you wish to engage in more social and exercise related activities:  -Silver sneakers https://tools.silversneakers.com  -Walk with a Doc: http://www.duncan-[email or web address]/  -Check out the [facility] Active Adults 50+ section on the [place] [employer] (hiking clubs, book clubs, cards and games, chess, exercise classes, aquatic classes and much more) - see the website for details: https://www.[place].gov/departments/parks-recreation/active-adults50  -YouTube has lots of exercise videos for different ages and abilities as well  -[facility] (a variety of indoor and outdoor inperson activities for adults). [phone]. [address]  -Virtual Online Classes (a variety of topics): see seniorplanet.org or call  [phone]  -consider volunteering at a school, hospice center, church, senior center or elsewhere          ADVANCED HEALTHCARE DIRECTIVES:  [place] Advanced Directives assistance:   ExpressWeek.com.cy  Everyone should have advanced health care directives in place. This is so that you get the care you want, should you ever be in a situation where you are unable to make your own medical decisions.   From the [place] Advanced Directive Website: "Advance Health Care Directives are legal documents in which you give written instructions about your health care if, in the future, you cannot speak for yourself.   A health care power of attorney allows you to name a person you trust to make your health care decisions if you cannot make them yourself. A declaration of a desire for a natural death (or living will) is document, which states that you desire not to have your life prolonged by extraordinary measures if you have a terminal or incurable illness or if you are in a vegetative state. An advance instruction for mental health treatment makes a declaration of instructions, information and preferences regarding your mental health treatment. It also states that you are aware that the advance instruction authorizes a mental health treatment provider to act according to your wishes. It may also outline your consent or refusal of mental health treatment. A declaration of an anatomical gift allows anyone over the age of 73 to make a gift by will, organ donor card or other document."   Please see the following website or an elder law attorney for forms, FAQs and for completion of advanced directives: [place]  Print production planner Health Care Directives  Advance Health Care Directives (http://guzman.[email or web address]/)  Or copy and paste the following to your web browser: [email or web address].fi

## 2023-12-03 NOTE — Progress Notes (Signed)
 PATIENT CHECK-IN and HEALTH RISK ASSESSMENT QUESTIONNAIRE:  -completed by phone/video for upcoming Medicare Preventive Visit  Pre-Visit Check-in: 1)Vitals (height, wt, BP, etc) - record in vitals section for visit on day of visit Request home vitals (wt, BP, etc.) and enter into vitals, THEN update Vital Signs SmartPhrase below at the top of the HPI. See below.  2)Review and Update Medications, Allergies PMH, Surgeries, Social history in Epic 3)Hospitalizations in the last year with date/reason? n  4)Review and Update Care Team (patient's specialists) in Epic 5) Complete PHQ9 in Epic  6) Complete Fall Screening in Epic 7)Review all Health Maintenance Due and order under PCP if not done.  Medicare Wellness Patient Questionnaire:  Answer theses question about your habits: How often do you have a drink containing alcohol? monthly How many drinks containing alcohol do you have on a typical day when you are drinking?1 How often do you have six or more drinks on one occasion? never Have you ever smoked?n, quit in 2008 How many packs a day do/did you smoke? 1ppf Do you use smokeless tobacco?n Do you use an illicit drugs?y marijuana some On average, how many days per week do you engage in moderate to strenuous exercise (like a brisk walk)? 2 days, walking On average, how many minutes do you engage in exercise at this level? 10 Typical diet: loves salads and veggies, is working on a budget, cookies, nuts, fruits  Beverages:  water, juice and sweet green tea Social Connections: does not have many social connections, does go to stores, [employer] and farmers market  Answer theses question about your everyday activities: Can you perform most household chores?no Are you deaf or have significant trouble hearing? no Do you feel that you have a problem with memory?no Do you feel safe at home?y Last dentist visit?goes once per year 8. Do you have any difficulty performing your everyday  activities?no Are you having any difficulty walking, taking medications on your own, and or difficulty managing daily home needs?no Do you have difficulty walking or climbing stairs?no Do you have difficulty dressing or bathing?no Do you have difficulty doing errands alone such as visiting a doctor's office or shopping?no Do you currently have any difficulty preparing food and eating?no Do you currently have any difficulty using the toilet?no Do you have any difficulty managing your finances?no Do you have any difficulties with housekeeping of managing your housekeeping?no   Do you have Advanced Directives in place (Living Will, Healthcare Power or Attorney)? N - on to do list   Last eye Exam and location? [facility] this [facility]   Do you currently use prescribed or non-prescribed narcotic or opioid pain medications?n  Do you have a history or close family history of breast, ovarian, tubal or peritoneal cancer or a family member with BRCA (breast cancer susceptibility 1 and 2) gene mutations? Sister has rectal       ----------------------------------------------------------------------------------------------------------------------------------------------------------------------------------------------------------------------  Because this visit was a virtual/telehealth visit, some criteria may be missing or patient reported. Any vitals not documented were not able to be obtained and vitals that have been documented are patient reported.    MEDICARE ANNUAL PREVENTIVE VISIT WITH PROVIDER: (Welcome to Medicare, initial annual wellness or annual wellness exam)  Virtual Visit via Video Note  I connected with Donna Woodward on 12/03/23 by a video enabled telemedicine application and verified that I am speaking with the correct person using two identifiers.  Location patient: home Location provider:work or home office Persons  participating in the virtual  visit: patient, provider  Concerns and/or follow up today: pretty stable.   See HM section in Epic for other details of completed HM.    ROS: negative for report of fevers, unintentional weight loss, vision changes, vision loss, hearing loss or change, chest pain, sob, hemoptysis, melena, hematochezia, hematuria, falls, bleeding or bruising, thoughts of suicide or self harm, memory loss  Patient-completed extensive health risk assessment - reviewed and discussed with the patient: See Health Risk Assessment completed with patient prior to the visit either above or in recent phone note. This was reviewed in detailed with the patient today and appropriate recommendations, orders and referrals were placed as needed per Summary below and patient instructions.   Review of Medical History: -PMH, PSH, Family History and current specialty and care providers reviewed and updated and listed below   Patient Care Team: Viola Greulich, MD as PCP - General (Family Medicine) Loyde Rule, MD as PCP - Cardiology (Cardiology)   Past Medical History:  Diagnosis Date   Anxiety    Back pain    Cataract    Coronary artery disease    CABGx3; Seen by Dr. Stann Earnest   Degenerative joint disease (DJD) of lumbar spine 03/24/2016   Depression    GERD (gastroesophageal reflux disease)    if overweight   Hyperlipidemia    Hypertension     Past Surgical History:  Procedure Laterality Date   BACK SURGERY     CARDIAC CATHETERIZATION N/A 06/05/2016   Procedure: Left Heart Cath and Coronary Angiography;  Surgeon: Arty Binning, MD;  Location[facility] INVASIVE CV LAB;  Service: Cardiovascular;  Laterality: N/A;   COLONOSCOPY     CORONARY ARTERY BYPASS GRAFT N/A 06/17/2016   Procedure: CORONARY ARTERY BYPASS GRAFTING (CABG), ON PUMP, TIMES THREE, USING LEFT INTERNAL MAMMARY ARTERY, RIGHT GREATER SAPHENOUS VEIN HARVESTED ENDOSCOPICALLY;  Surgeon: Heriberto London, MD;  Location: [facility] OR;  Service: Open Heart Surgery;   Laterality: N/A;   MULTIPLE TOOTH EXTRACTIONS     SPINE SURGERY  1990   lamnectomy L5S1   TEE WITHOUT CARDIOVERSION N/A 06/17/2016   Procedure: TRANSESOPHAGEAL ECHOCARDIOGRAM (TEE);  Surgeon: Heriberto London, MD;  Location: [facility] OR;  Service: Open Heart Surgery;  Laterality: N/A;    Social History   Socioeconomic History   Marital status: Single    Spouse name: Not on file   Number of children: Not on file   Years of education: 74   Highest education level: Master's degree (e.g., MA, MS, MEng, MEd, MSW, MBA)  Occupational History   Occupation: disabled    Comment: warehouse  Tobacco Use   Smoking status: Former    Current packs/day: 0.00    Types: Cigarettes    Quit date: 12/04/2006    Years since quitting: 17.0   Smokeless tobacco: Never  Vaping Use   Vaping status: Not on file  Substance and Sexual Activity   Alcohol use: Yes    Comment: occasional   Drug use: Yes    Types: Marijuana    Comment: daily   Sexual activity: Not Currently  Other Topics Concern   Not on file  Social History Narrative   Masters in behavioral health   Live with room mate.   Grew up in [place]  and lived here for since 2014.    Disabled with back pain.    Social Drivers of Corporate investment banker Strain: Low Risk  (12/03/2023)   Overall Physicist, medical Strain (  CARDIA)    Difficulty of Paying Living Expenses: Not very hard  Food Insecurity: Food Insecurity Present (12/03/2023)   Hunger Vital Sign    Worried About Running Out of Food in the Last Year: Sometimes true    Ran Out of Food in the Last Year: Sometimes true  Transportation Needs: No Transportation Needs (12/03/2023)   PRAPARE - Administrator, Civil Service (Medical): No    Lack of Transportation (Non-Medical): No  Physical Activity: Insufficiently Active (12/03/2023)   Exercise Vital Sign    Days of Exercise per Week: 2 days    Minutes of Exercise per Session: 10 min  Stress: No Stress Concern Present (12/03/2023)    [employer] of Occupational Health - Occupational Stress Questionnaire    Feeling of Stress : Only a little  Social Connections: Socially Isolated (12/03/2023)   Social Connection and Isolation Panel [NHANES]    Frequency of Communication with Friends and Family: Never    Frequency of Social Gatherings with Friends and Family: Never    Attends Religious Services: Never    Database administrator or Organizations: No    Attends Engineer, structural: Not on file    Marital Status: Never married  Intimate Partner Violence: Not At Risk (11/07/2022)   Humiliation, Afraid, Rape, and Kick questionnaire    Fear of Current or Ex-Partner: No    Emotionally Abused: No    Physically Abused: No    Sexually Abused: No    Family History  Problem Relation Age of Onset   Hypertension Mother    Stroke Father    Heart disease Father    Dementia Father    Hypertension Sister    CAD Sister 44       cabg   Rectal cancer Sister    Hypertension Sister    Cancer Sister 35       rectal   Cancer - Cervical Sister        CYST ON CERVIX  APRIL 2024   Cancer - Other Sister        lymph node stage 3 cancer sept 2024   Hypertension Sister    Stomach cancer Neg Hx    Colon polyps Neg Hx    Colon cancer Neg Hx     Current Outpatient Medications on File Prior to Visit  Medication Sig Dispense Refill   aspirin  EC 81 MG tablet Take 1 tablet (81 mg total) by mouth daily.     atorvastatin  (LIPITOR) 40 MG tablet TAKE 1 TABLET EVERY DAY 90 tablet 3   Calcium  Carb-Cholecalciferol (CALCIUM  + D3 PO) Take 1 tablet by mouth daily with breakfast.     cyclobenzaprine  (FLEXERIL ) 5 MG tablet TAKE 1 TO 2 TABLETS AT BEDTIME AS NEEDED FOR MUSCLE SPASM(S) 90 tablet 1   metoprolol  succinate (TOPROL -XL) 100 MG 24 hr tablet TAKE 1 TABLET DAILY. TAKE WITH OR IMMEDIATELY FOLLOWING A MEAL. 90 tablet 3   NIFEdipine  (PROCARDIA -XL/NIFEDICAL-XL) 30 MG 24 hr tablet TAKE 1 TABLET EVERY DAY 90 tablet 3   Omega-3 Fatty  Acids (FISH OIL ) 1000 MG CAPS Take 1,000 mg by mouth in the morning and at bedtime.      spironolactone  (ALDACTONE ) 25 MG tablet Take 1 tablet (25 mg total) by mouth daily. 90 tablet 3   No current facility-administered medications on file prior to visit.    Allergies  Allergen Reactions   Bee Venom Swelling and Other (See Comments)    Swells where stung- no  breathing issues       Physical Exam Vitals requested from patient and listed below if patient had equipment and was able to obtain at home for this virtual visit: There were no vitals filed for this visit. Estimated body mass index is 27 kg/m as calculated from the following:   Height as of 11/05/23: 5\' 3"  (1.6 m).   Weight as of 11/05/23: 152 lb 6.4 oz (69.1 kg).  EKG (optional): deferred due to virtual visit  GENERAL: alert, oriented, no acute distress detected, full vision exam deferred due to pandemic and/or virtual encounter  HEENT: atraumatic, conjunttiva clear, no obvious abnormalities on inspection of external nose and ears  NECK: normal movements of the head and neck  LUNGS: on inspection no signs of respiratory distress, breathing rate appears normal, no obvious gross SOB, gasping or wheezing  CV: no obvious cyanosis  MS: moves all visible extremities without noticeable abnormality  PSYCH/NEURO: pleasant and cooperative, no obvious depression or anxiety, speech and thought processing grossly intact, Cognitive function grossly intact  Flowsheet Row Clinical Support from 11/07/2022 in [facility]  PHQ-9 Total Score 0           12/03/2023   12:25 PM 11/07/2022    9:07 AM 10/06/2022    8:48 AM 04/11/2022   11:39 AM 11/05/2021    9:35 AM  Depression screen PHQ 2/9  Decreased Interest 0 0 3 2 1   Down, Depressed, Hopeless 1 0 1 2 3   PHQ - 2 Score 1 0 4 4 4   Altered sleeping  0 2 1 3   Tired, decreased energy  0 2 2 1   Change in appetite  0 3 2 0  Feeling bad or failure about  yourself   0 1 2 0  Trouble concentrating  0 2 2 0  Moving slowly or fidgety/restless  0 0 0 0  Suicidal thoughts  0 0 0 0  PHQ-9 Score  0 14 13 8   Difficult doing work/chores  Not difficult at all Somewhat difficult Somewhat difficult Somewhat difficult  Has depression but says she is doing ok - reports stays busy. No thoughts of harm. Has information for scheduling counseling if needs it.      11/13/2022    2:06 PM 11/13/2023   11:54 AM 12/01/2023    6:15 PM 12/03/2023   11:48 AM 12/03/2023   12:25 PM  Fall Risk  Falls in the past year? 0 0 0 0 0  Was there an injury with Fall?    0 0  Fall Risk Category Calculator    0 0  Patient at Risk for Falls Due to    No Fall Risks   Fall risk Follow up    Falls evaluation completed Falls evaluation completed;Education provided     SUMMARY AND PLAN:  Encounter for Medicare annual wellness exam  Discussed applicable health maintenance/preventive health measures and advised and referred or ordered per patient preferences: -reports had mammogram Monday -sees Dr. Arliss Lam for gyn exams Health Maintenance  Topic Date Due   COVID-19 Vaccine (5 - 2024-25 season) 12/09/2023   INFLUENZA VACCINE  01/29/2024   Medicare Annual Wellness (AWV)  12/02/2024   MAMMOGRAM  11/29/2025   DTaP/Tdap/Td (3 - Td or Tdap) 11/07/2027   Colonoscopy  03/26/2028   Pneumonia Vaccine 35+ Years old  Completed   DEXA SCAN  Completed   Hepatitis C Screening  Completed   Zoster Vaccines- Shingrix   Completed   HPV VACCINES  Aged Out   Meningococcal B Vaccine  Aged [employer] and counseling on the following was provided based on the above review of health and a plan/checklist for the patient, along with additional information discussed, was provided for the patient in the patient instructions :  -Advised on importance of completing advanced directives, discussed options for completing and provided information in patient instructions as well -Advised and counseled  on a healthy lifestyle - including the importance of a healthy diet, regular physical activity, social connections and stress management. -Reviewed patient's current diet. Advised and counseled on a whole foods based healthy diet.Talked at length about ways to reduce grams of added sugar per day and ways to eat healthy on a budget.  A summary of a healthy diet was provided in the Patient Instructions.  -reviewed patient's current physical activity level and discussed exercise guidelines for adults. Discussed community resources and ideas for safe exercise at home to assist in meeting exercise guideline recommendations in a safe and healthy way.  -Advise yearly dental visits at minimum and regular eye exams -Advised and counseled on alcohol safe limits  Follow up: see patient instructions     Patient Instructions  I really enjoyed getting to talk with you today! I am available on Tuesdays and Thursdays for virtual visits if you have any questions or concerns, or if I can be of any further assistance.   CHECKLIST FROM ANNUAL WELLNESS VISIT:  -Follow up (please call to schedule if not scheduled after visit):   -yearly for annual wellness visit with primary care office  Here is a list of your preventive care/health maintenance measures and the plan for each if any are due:  PLAN For any measures below that may be due:   Health Maintenance  Topic Date Due   COVID-19 Vaccine (5 - 2024-25 season) 12/09/2023   INFLUENZA VACCINE  01/29/2024   Medicare Annual Wellness (AWV)  12/02/2024   MAMMOGRAM  11/29/2025   DTaP/Tdap/Td (3 - Td or Tdap) 11/07/2027   Colonoscopy  03/26/2028   Pneumonia Vaccine 27+ Years old  Completed   DEXA SCAN  Completed   Hepatitis C Screening  Completed   Zoster Vaccines- Shingrix   Completed   HPV VACCINES  Aged Out   Meningococcal B Vaccine  Aged Out    -See a dentist at least yearly  -Get your eyes checked and then per your eye specialist's  recommendations  -Other issues addressed today:   -I have included below further information regarding a healthy whole foods based diet, physical activity guidelines for adults, stress management and opportunities for social connections. I hope you find this information useful.   -----------------------------------------------------------------------------------------------------------------------------------------------------------------------------------------------------------------------------------------------------------    NUTRITION: -eat real food: lots of colorful vegetables (half the plate) and fruits -5-7 servings of vegetables and fruits per day (fresh or steamed is best), exp. 2 servings of vegetables with lunch and dinner and 2 servings of fruit per day. Berries and greens such as kale and collards are great choices.  -consume on a regular basis:  fresh fruits, fresh veggies, fish, nuts, seeds, healthy oils (such as olive oil, avocado oil), whole grains (make sure for bread/pasta/crackers/etc., that the first ingredient on label contains the word "whole"), legumes. -can eat small amounts of dairy and lean meat (no larger than the palm of your hand), but avoid processed meats such as ham, bacon, lunch meat, etc. -drink water -try to avoid fast food and pre-packaged foods, processed meat, ultra processed foods/beverages (  donuts, candy, etc.) -most experts advise limiting sodium to < 2300mg  per day, should limit further is any chronic conditions such as high blood pressure, heart disease, diabetes, etc. The American Heart Association advised that < 1500mg  is is ideal -try to avoid foods/beverages that contain any ingredients with names you do not recognize  -try to avoid foods/beverages  with added sugar or sweeteners/sweets  -try to avoid sweet drinks (including diet drinks): soda, juice, Gatorade, sweet tea, power drinks, diet drinks -try to avoid white rice, white bread, pasta  (unless whole grain)  EXERCISE GUIDELINES FOR ADULTS: -if you wish to increase your physical activity, do so gradually and with the approval of your doctor -STOP and seek medical care immediately if you have any chest pain, chest discomfort or trouble breathing when starting or increasing exercise  -move and stretch your body, legs, feet and arms when sitting for long periods -Physical activity guidelines for optimal health in adults: -get at least 150 minutes per week of moderate exercise (can talk, but not sing); this is about 20-30 minutes of sustained activity 5-7 days per week or two 10-15 minute episodes of sustained activity 5-7 days per week -do some muscle building/resistance training/strength training at least 2 days per week  -balance exercises 3+ days per week:   Stand somewhere where you have something sturdy to hold onto if you lose balance    1) lift up on toes, then back down, start with 5x per day and work up to 20x   2) stand and lift one leg straight out to the side so that foot is a few inches of the floor, start with 5x each side and work up to 20x each side   3) stand on one foot, start with 5 seconds each side and work up to 20 seconds on each side  If you need ideas or help with getting more active:  -Silver sneakers https://tools.silversneakers.com  -Walk with a Doc: http://www.duncan-williams.com/  -try to include resistance (weight lifting/strength building) and balance exercises twice per week: or the following link for ideas: http://castillo-powell.com/  BuyDucts.dk  STRESS MANAGEMENT: -can try meditating, or just sitting quietly with deep breathing while intentionally relaxing all parts of your body for 5 minutes daily -if you need further help with stress, anxiety or depression please follow up with your primary doctor or contact the wonderful folks at [facility]: [phone]  SOCIAL CONNECTIONS: -options in [place] if you wish to engage in more social and exercise related activities:  -Silver sneakers https://tools.silversneakers.com  -Walk with a Doc: http://www.duncan-williams.com/  -Check out the [facility] Active Adults 50+ section on the [place] of [employer] (hiking clubs, book clubs, cards and games, chess, exercise classes, aquatic classes and much more) - see the website for details: https://www.-[place].gov/departments/parks-recreation/active-adults50  -YouTube has lots of exercise videos for different ages and abilities as well  -[facility] (a variety of indoor and outdoor inperson activities for adults). [phone]. [address]  -Virtual Online Classes (a variety of topics): see seniorplanet.org or call [phone]  -consider volunteering at a school, hospice center, church, senior center or elsewhere          ADVANCED HEALTHCARE DIRECTIVES:  [place] Advanced Directives assistance:   ExpressWeek.com.cy  Everyone should have advanced health care directives in place. This is so that you get the care you want, should you ever be in a situation where you are unable to make your own medical decisions.   From the [facility] Advanced Directive  Website: "Advance Health Care Directives are legal documents in which you give written instructions about your health care if, in the future, you cannot speak for yourself.   A health care power of attorney allows you to name a person you trust to make your health care decisions if you cannot make them yourself. A declaration of a desire for a natural death (or living will) is document, which states that you desire not to have your life prolonged by extraordinary measures if you have a terminal or incurable illness or if you are in a vegetative state. An advance instruction for mental health treatment  makes a declaration of instructions, information and preferences regarding your mental health treatment. It also states that you are aware that the advance instruction authorizes a mental health treatment provider to act according to your wishes. It may also outline your consent or refusal of mental health treatment. A declaration of an anatomical gift allows anyone over the age of 19 to make a gift by will, organ donor card or other document."   Please see the following website or an elder law attorney for forms, FAQs and for completion of advanced directives: [place]  Print production planner Health Care Directives Advance Health Care Directives (http://guzman.com/)  Or copy and paste the following to your web browser: PoshChat.fi    Maurie Southern, DO

## 2023-12-19 DIAGNOSIS — H524 Presbyopia: Secondary | ICD-10-CM | POA: Diagnosis not present

## 2023-12-19 DIAGNOSIS — H52209 Unspecified astigmatism, unspecified eye: Secondary | ICD-10-CM | POA: Diagnosis not present

## 2023-12-19 DIAGNOSIS — H5203 Hypermetropia, bilateral: Secondary | ICD-10-CM | POA: Diagnosis not present

## 2023-12-19 DIAGNOSIS — H5213 Myopia, bilateral: Secondary | ICD-10-CM | POA: Diagnosis not present

## 2024-01-31 ENCOUNTER — Other Ambulatory Visit: Payer: Self-pay | Admitting: Family Medicine

## 2024-02-05 ENCOUNTER — Encounter: Payer: Self-pay | Admitting: Family Medicine

## 2024-03-03 ENCOUNTER — Other Ambulatory Visit: Payer: Self-pay | Admitting: Cardiovascular Disease

## 2024-03-03 ENCOUNTER — Other Ambulatory Visit: Payer: Self-pay | Admitting: Family Medicine

## 2024-03-03 DIAGNOSIS — I10 Essential (primary) hypertension: Secondary | ICD-10-CM

## 2024-04-04 ENCOUNTER — Encounter: Payer: Self-pay | Admitting: Family Medicine

## 2024-04-12 ENCOUNTER — Other Ambulatory Visit: Payer: Self-pay

## 2024-04-12 ENCOUNTER — Telehealth: Payer: Self-pay | Admitting: Gastroenterology

## 2024-04-12 MED ORDER — HYDROCORTISONE (PERIANAL) 2.5 % EX CREA: 1.0000 | TOPICAL_CREAM | Freq: Two times a day (BID) | CUTANEOUS | 0 refills | Status: AC

## 2024-04-12 NOTE — Telephone Encounter (Signed)
Patient advised and agrees to this plan of care.

## 2024-04-12 NOTE — Telephone Encounter (Signed)
 Inbound call from patient stating last Sunday she has had a tiny amount of blood in her stool. States today it was a little bit more blood in her stool. Patient is requesting a call back to discuss further. Please advise, thank you

## 2024-04-12 NOTE — Telephone Encounter (Signed)
 Please schedule office visit soon for rectal exam and evaluation.  Please send prescription for Anusol cream per rectum twice daily for 7 days, further recommendation based on findings and discussion during office visit.  Thank you.

## 2024-04-12 NOTE — Telephone Encounter (Signed)
 Patient calls. 2 spells of small amount of blood on the tissue after her bowel movement. Denies pain, constipation, itching or other symptoms. She is concerned however because her sister has history of rectal cancer which has returned and considered terminal this time. Last colonoscopy 03/27/23 and has a 5 year recall.

## 2024-04-12 NOTE — Telephone Encounter (Signed)
 Appointment 04/21/24 at 8:30 am.

## 2024-04-13 ENCOUNTER — Encounter: Payer: Self-pay | Admitting: Family Medicine

## 2024-04-13 ENCOUNTER — Ambulatory Visit: Admitting: Family Medicine

## 2024-04-13 VITALS — BP 138/79 | HR 67 | Temp 97.1°F | Wt 158.4 lb

## 2024-04-13 DIAGNOSIS — G8929 Other chronic pain: Secondary | ICD-10-CM | POA: Diagnosis not present

## 2024-04-13 DIAGNOSIS — M25511 Pain in right shoulder: Secondary | ICD-10-CM

## 2024-04-13 DIAGNOSIS — F331 Major depressive disorder, recurrent, moderate: Secondary | ICD-10-CM

## 2024-04-13 DIAGNOSIS — H9190 Unspecified hearing loss, unspecified ear: Secondary | ICD-10-CM | POA: Diagnosis not present

## 2024-04-13 DIAGNOSIS — Z59869 Financial insecurity, unspecified: Secondary | ICD-10-CM

## 2024-04-13 DIAGNOSIS — I10 Essential (primary) hypertension: Secondary | ICD-10-CM

## 2024-04-13 DIAGNOSIS — H6591 Unspecified nonsuppurative otitis media, right ear: Secondary | ICD-10-CM | POA: Diagnosis not present

## 2024-04-13 NOTE — Patient Instructions (Signed)
 I have included some information about the ear effusion.  The information listed also talks about otitis media which is an ear infection which you do not have.  You just have the effusion.  You can try taking over-the-counter antihistamines to see if you notice improvement in your hearing as the effusion could slightly decrease your hearing.  The order for the Xray of your shoulder was placed and you can have it done next wk at [facility] office when you have your appt with GI.

## 2024-04-13 NOTE — Progress Notes (Signed)
 Established Patient Office Visit   Subjective  Patient ID: Donna Woodward, female    DOB: 13-Nov-1955  Age: 68 y.o. MRN: [id number]  Chief Complaint  Patient presents with   Shoulder Pain    Right shoulder since March.    Patient is a 68 year old female seen for ongoing concern.  Patient endorses right shoulder pain since March 2025.  Started after stretching by reaching overhead with both arms when pt heard a pop in R shoulder and felt a pulling sensation in R tricep.  Mild edema of anterior shPt states pain is a dull, nagging sensation.  Occurs with lifting arm up.  Unable to sleep on R side due to pain.  Tried heat and ice.  Pt is R handed.  Does not recall weakness/dropping items, but unsure if she just switched hands before dropping anything.  Pt endorses continued depression.  States stays at home and does not interact socially.  Endorses strained relationships with former friends and family.  Showering every few days in the collecting chores at home such as cleaning.  Not currently interested in medication or counseling.  Pt concerned about finances.  Able to pay bills but has to wait on things like buying a new pair of sneakers or repairing her sink as she had to get a laptop.  Pt also notes her sister's cancer has returned.  Pt notes hearing has changed.  Cleans ears regularly.  Feels like the voices on tv are speaking at a whisper.  Pt has an appt with [facility] next wk for recent rectal bleeding.  Unsure if due to hemorrhoids.    Patient Active Problem List   Diagnosis Date Noted   History of colonic polyps 01/12/2023   Colitis with rectal bleeding 10/31/2022   Acute kidney injury superimposed on chronic kidney disease 10/31/2022   AAA (abdominal aortic aneurysm) without rupture 10/31/2022   Near syncope 05/02/2020   Anxiety 11/06/2017   S/P CABG x 3 06/17/2016   Family history of early CAD 06/04/2016   Chest pain 06/04/2016   Abnormal nuclear stress test 06/04/2016    Degenerative joint disease (DJD) of lumbar spine 03/24/2016   Depression 03/24/2016   Obesity 08/14/2015   Essential hypertension 04/29/2015   Hyperlipidemia 04/29/2015   Past Medical History:  Diagnosis Date   Anxiety    Back pain    Cataract    Coronary artery disease    CABGx3; Seen by Dr. Delford   Degenerative joint disease (DJD) of lumbar spine 03/24/2016   Depression    GERD (gastroesophageal reflux disease)    if overweight   Hyperlipidemia    Hypertension    Past Surgical History:  Procedure Laterality Date   BACK SURGERY     CARDIAC CATHETERIZATION N/A 06/05/2016   Procedure: Left Heart Cath and Coronary Angiography;  Surgeon: Victory LELON Sharps, MD;  Location: [facility] INVASIVE CV LAB;  Service: Cardiovascular;  Laterality: N/A;   COLONOSCOPY     CORONARY ARTERY BYPASS GRAFT N/A 06/17/2016   Procedure: CORONARY ARTERY BYPASS GRAFTING (CABG), ON PUMP, TIMES THREE, USING LEFT INTERNAL MAMMARY ARTERY, RIGHT GREATER SAPHENOUS VEIN HARVESTED ENDOSCOPICALLY;  Surgeon: Maude Fleeta Ochoa, MD;  Location: [facility] OR;  Service: Open Heart Surgery;  Laterality: N/A;   MULTIPLE TOOTH EXTRACTIONS     SPINE SURGERY  1990   lamnectomy L5S1   TEE WITHOUT CARDIOVERSION N/A 06/17/2016   Procedure: TRANSESOPHAGEAL ECHOCARDIOGRAM (TEE);  Surgeon: Maude Fleeta Ochoa, MD;  Location: [facility] OR;  Service: Open Heart  Surgery;  Laterality: N/A;   Social History   Tobacco Use   Smoking status: Former    Current packs/day: 0.00    Types: Cigarettes    Quit date: 12/04/2006    Years since quitting: 17.3   Smokeless tobacco: Never  Substance Use Topics   Alcohol use: Yes    Comment: occasional   Drug use: Yes    Types: Marijuana    Comment: daily   Family History  Problem Relation Age of Onset   Hypertension Mother    Stroke Father    Heart disease Father    Dementia Father    Hypertension Sister    CAD Sister 34       cabg   Rectal cancer Sister    Hypertension Sister    Cancer Sister 90       rectal    Cancer - Cervical Sister        CYST ON CERVIX  APRIL 2024   Cancer - Other Sister        lymph node stage 3 cancer sept 2024   Hypertension Sister    Stomach cancer Neg Hx    Colon polyps Neg Hx    Colon cancer Neg Hx    Allergies  Allergen Reactions   Bee Venom Swelling and Other (See Comments)    Swells where stung- no breathing issues    ROS Negative unless stated above    Objective:     BP 138/79 (BP Location: Left Arm, Patient Position: Sitting, Cuff Size: Normal)   Pulse 67   Temp (!) 97.1 F (36.2 C) (Oral)   Wt 158 lb 6.4 oz (71.8 kg)   SpO2 97%   BMI 28.06 kg/m  BP Readings from Last 3 Encounters:  04/13/24 138/79  11/05/23 130/80  10/15/23 (!) 158/90   Wt Readings from Last 3 Encounters:  04/13/24 158 lb 6.4 oz (71.8 kg)  11/05/23 152 lb 6.4 oz (69.1 kg)  10/15/23 153 lb 9.6 oz (69.7 kg)   Hearing Screening   500Hz  1000Hz  2000Hz  4000Hz   Right ear Pass Pass Pass Fail  Left ear Pass Pass Pass Fail   Physical Exam Constitutional:      General: She is not in acute distress.    Appearance: Normal appearance.     Comments: Tearful at one point.  HENT:     Head: Normocephalic and atraumatic.     Right Ear: Ear canal and external ear normal.     Left Ear: Tympanic membrane, ear canal and external ear normal.     Ears:     Comments: Effusion of R TM.    Nose: Nose normal.     Mouth/Throat:     Mouth: Mucous membranes are moist.  Cardiovascular:     Rate and Rhythm: Normal rate and regular rhythm.     Heart sounds: Normal heart sounds. No murmur heard.    No gallop.  Pulmonary:     Effort: Pulmonary effort is normal. No respiratory distress.     Breath sounds: Normal breath sounds. No wheezing, rhonchi or rales.  Musculoskeletal:     Right shoulder: Swelling and tenderness present. Decreased range of motion.     Left shoulder: Normal.     Comments: TTP of R AC joint.  No TTP of long head R biceps brachii tendon.  Limited ROM, 80 degrees  Abduction of RUE.  Negative empty can.  Skin:    General: Skin is warm and dry.  Neurological:  Mental Status: She is alert and oriented to person, place, and time.        04/13/2024   10:10 AM 12/03/2023   12:25 PM 11/07/2022    9:07 AM  Depression screen PHQ 2/9  Decreased Interest 3 0 0  Down, Depressed, Hopeless 2 1 0  PHQ - 2 Score 5 1 0  Altered sleeping 3  0  Tired, decreased energy 1  0  Change in appetite 1  0  Feeling bad or failure about yourself  1  0  Trouble concentrating 1  0  Moving slowly or fidgety/restless 1  0  Suicidal thoughts 0  0  PHQ-9 Score 13  0  Difficult doing work/chores Somewhat difficult  Not difficult at all      04/13/2024   10:11 AM 10/06/2022    8:49 AM 10/20/2019    8:56 AM 05/06/2018    1:53 PM  GAD 7 : Generalized Anxiety Score  Nervous, Anxious, on Edge 1 1 0 2  Control/stop worrying 1 1 1 1   Worry too much - different things 1 1 1 2   Trouble relaxing 1 1 1 2   Restless 0 0 0 1  Easily annoyed or irritable 2 1 1 3   Afraid - awful might happen 1 0 0 0  Total GAD 7 Score 7 5 4 11   Anxiety Difficulty Not difficult at all Somewhat difficult Somewhat difficult      No results found for any visits on 04/13/24.    Assessment & Plan:   Chronic right shoulder pain -     DG Shoulder Right; Future  Decreased hearing, unspecified laterality  MEE (middle ear effusion), right  Moderate episode of recurrent major depressive disorder (HCC) -     Ambulatory referral to Social Work  Essential hypertension  Financial insecurity -     Ambulatory referral to Social Work  Chronic pain R shoulder, possibly d/t arthritis, AC joint separation.  Obtain Xray-will have done next wk at [facility] office when seen by GI to save gas.  Further recommendations based on results including PT, injection, referral to Ortho.  Continue supportive care with tylenol , topical analgesics etc.  Hearing checked in clinic.  Unable to hear b/l.  Decreased  hearing possible related to effusion.  OTC antihistamine.  Given sample of allergra.  HTN stable.  Continue current meds including Toprol  XL 100 mg daily, procardia  XL 30 mg daily, and spironolactone  25 mg daily.  Continue lifestyle modifications.  Referral to SW for financial insecurity, depression, etc.  PHQ 9 score 13 and GAD 7 score 7 this visit.  Advised to consider medication options.  Pt declines at this time.  Not sure she would like to do counseling.  Discussed the importance of self care.  Continue to monitor.  Given strict precautions for worsened symptoms.  Return if symptoms worsen or fail to improve.   Clotilda JONELLE Single, MD

## 2024-04-19 DIAGNOSIS — M62838 Other muscle spasm: Secondary | ICD-10-CM | POA: Diagnosis not present

## 2024-04-19 DIAGNOSIS — F329 Major depressive disorder, single episode, unspecified: Secondary | ICD-10-CM | POA: Diagnosis not present

## 2024-04-19 DIAGNOSIS — R7303 Prediabetes: Secondary | ICD-10-CM | POA: Diagnosis not present

## 2024-04-19 DIAGNOSIS — Z7982 Long term (current) use of aspirin: Secondary | ICD-10-CM | POA: Diagnosis not present

## 2024-04-19 DIAGNOSIS — M2530 Other instability, unspecified joint: Secondary | ICD-10-CM | POA: Diagnosis not present

## 2024-04-19 DIAGNOSIS — M1990 Unspecified osteoarthritis, unspecified site: Secondary | ICD-10-CM | POA: Diagnosis not present

## 2024-04-19 DIAGNOSIS — H269 Unspecified cataract: Secondary | ICD-10-CM | POA: Diagnosis not present

## 2024-04-19 DIAGNOSIS — I2510 Atherosclerotic heart disease of native coronary artery without angina pectoris: Secondary | ICD-10-CM | POA: Diagnosis not present

## 2024-04-19 DIAGNOSIS — I10 Essential (primary) hypertension: Secondary | ICD-10-CM | POA: Diagnosis not present

## 2024-04-19 DIAGNOSIS — E785 Hyperlipidemia, unspecified: Secondary | ICD-10-CM | POA: Diagnosis not present

## 2024-04-21 ENCOUNTER — Encounter: Payer: Self-pay | Admitting: Gastroenterology

## 2024-04-21 ENCOUNTER — Ambulatory Visit (INDEPENDENT_AMBULATORY_CARE_PROVIDER_SITE_OTHER)
Admission: RE | Admit: 2024-04-21 | Discharge: 2024-04-21 | Disposition: A | Discharge status: Home / Self Care | Source: Ambulatory Visit | Attending: Family Medicine | Admitting: Family Medicine

## 2024-04-21 ENCOUNTER — Ambulatory Visit: Admitting: Gastroenterology

## 2024-04-21 VITALS — BP 128/72 | HR 58 | Ht 63.0 in | Wt 160.1 lb

## 2024-04-21 DIAGNOSIS — G8929 Other chronic pain: Secondary | ICD-10-CM

## 2024-04-21 DIAGNOSIS — M19011 Primary osteoarthritis, right shoulder: Secondary | ICD-10-CM | POA: Diagnosis not present

## 2024-04-21 DIAGNOSIS — Z860101 Personal history of adenomatous and serrated colon polyps: Secondary | ICD-10-CM | POA: Diagnosis not present

## 2024-04-21 DIAGNOSIS — K625 Hemorrhage of anus and rectum: Secondary | ICD-10-CM

## 2024-04-21 DIAGNOSIS — Z800 Family history of malignant neoplasm of digestive organs: Secondary | ICD-10-CM | POA: Diagnosis not present

## 2024-04-21 DIAGNOSIS — K641 Second degree hemorrhoids: Secondary | ICD-10-CM

## 2024-04-21 DIAGNOSIS — M25511 Pain in right shoulder: Secondary | ICD-10-CM | POA: Diagnosis not present

## 2024-04-21 DIAGNOSIS — K648 Other hemorrhoids: Secondary | ICD-10-CM | POA: Diagnosis not present

## 2024-04-21 NOTE — Progress Notes (Signed)
 Donna Woodward    [id number]    May 03, 1956  Primary Care Physician:Banks, Clotilda JONELLE, MD  Referring Physician: Mercer Clotilda JONELLE, MD [address] [place],  [place] [address]   Chief complaint:  Rectal bleeding  Discussed the use of AI scribe software for clinical note transcription with the patient, who gave verbal consent to proceed.  History of Present Illness Donna Woodward is a 68 year old female who presents with rectal bleeding.  Rectal bleeding - Rectal bleeding first occurred last Tuesday, with a recurrence on Sunday - Blood observed on toilet paper when wiping, not mixed with stool - Second episode involved slightly larger and darker blood - No associated pain during bowel movements  Bowel habits - Bowel movements remain regular - Typically has a bowel movement within half an hour of waking, sometimes again an hour or two later  Use of bowel medications and dietary measures - No use of suppositories due to financial constraints - Does not take stool softeners or fiber supplements - Attempts to eat vegetables regularly    Colonoscopy 03/27/23 - One 4 mm polyp in the sigmoid colon, removed with a cold snare. Resected and retrieved. - Diverticulosis in the sigmoid colon. - Non- bleeding external and internal hemorrhoids.   - The perianal and digital rectal examinations were normal. - A 18 mm polyp was found in the sigmoid colon. The polyp was multi- lobulated, ulcerated and pedunculated. The polyp was removed with a hot snare. Resection and retrieval were complete. - Non- bleeding external and internal hemorrhoids were found during retroflexion. The hemorrhoids were medium- sized.  Outpatient Encounter Medications as of 04/21/2024  Medication Sig   aspirin  EC 81 MG tablet Take 1 tablet (81 mg total) by mouth daily.   atorvastatin  (LIPITOR) 40 MG tablet TAKE 1 TABLET EVERY DAY   Calcium  Carb-Cholecalciferol (CALCIUM  + D3 PO) Take 1 tablet  by mouth daily with breakfast.   cyclobenzaprine  (FLEXERIL ) 5 MG tablet TAKE 1 TO 2 TABLETS AT BEDTIME AS NEEDED FOR MUSCLE SPASM(S)   metoprolol  succinate (TOPROL -XL) 100 MG 24 hr tablet TAKE 1 TABLET DAILY WITH OR IMMEDIATELY FOLLOWING A MEAL.   NIFEdipine  (PROCARDIA -XL/NIFEDICAL-XL) 30 MG 24 hr tablet TAKE 1 TABLET EVERY DAY   Omega-3 Fatty Acids (FISH OIL ) 1000 MG CAPS Take 1,000 mg by mouth in the morning and at bedtime.    spironolactone  (ALDACTONE ) 25 MG tablet Take 1 tablet (25 mg total) by mouth daily.   No facility-administered encounter medications on file as of 04/21/2024.    Allergies as of 04/21/2024 - Review Complete 04/21/2024  Allergen Reaction Noted   Bee venom Swelling and Other (See Comments) 02/09/2020    Past Medical History:  Diagnosis Date   Anxiety    Back pain    Cataract    Coronary artery disease    CABGx3; Seen by Dr. Delford   Degenerative joint disease (DJD) of lumbar spine 03/24/2016   Depression    GERD (gastroesophageal reflux disease)    if overweight   Hyperlipidemia    Hypertension     Past Surgical History:  Procedure Laterality Date   BACK SURGERY     CARDIAC CATHETERIZATION N/A 06/05/2016   Procedure: Left Heart Cath and Coronary Angiography;  Surgeon: Victory LELON Sharps, MD;  Location: [facility] INVASIVE CV LAB;  Service: Cardiovascular;  Laterality: N/A;   COLONOSCOPY     CORONARY ARTERY BYPASS GRAFT N/A 06/17/2016  Procedure: CORONARY ARTERY BYPASS GRAFTING (CABG), ON PUMP, TIMES THREE, USING LEFT INTERNAL MAMMARY ARTERY, RIGHT GREATER SAPHENOUS VEIN HARVESTED ENDOSCOPICALLY;  Surgeon: Maude Fleeta Ochoa, MD;  Location: [facility] OR;  Service: Open Heart Surgery;  Laterality: N/A;   MULTIPLE TOOTH EXTRACTIONS     SPINE SURGERY  1990   lamnectomy L5S1   TEE WITHOUT CARDIOVERSION N/A 06/17/2016   Procedure: TRANSESOPHAGEAL ECHOCARDIOGRAM (TEE);  Surgeon: Maude Fleeta Ochoa, MD;  Location: [facility] OR;  Service: Open Heart Surgery;  Laterality: N/A;    Family  History  Problem Relation Age of Onset   Hypertension Mother    Stroke Father    Heart disease Father    Dementia Father    Hypertension Sister    CAD Sister 86       cabg   Colon cancer Sister    Rectal cancer Sister    Hypertension Sister    Cancer Sister 74       rectal   Cancer - Cervical Sister        CYST ON CERVIX  APRIL 2024   Cancer - Other Sister        lymph node stage 3 cancer sept 2024   Hypertension Sister    Stomach cancer Neg Hx    Colon polyps Neg Hx    Esophageal cancer Neg Hx    Pancreatic cancer Neg Hx     Social History   Socioeconomic History   Marital status: Single    Spouse name: Not on file   Number of children: Not on file   Years of education: 69   Highest education level: Master's degree (e.g., MA, MS, MEng, MEd, MSW, MBA)  Occupational History   Occupation: disabled    Comment: warehouse  Tobacco Use   Smoking status: Former    Current packs/day: 0.00    Types: Cigarettes    Quit date: 12/04/2006    Years since quitting: 17.3   Smokeless tobacco: Never  Vaping Use   Vaping status: Never Used  Substance and Sexual Activity   Alcohol use: Yes    Comment: occasional   Drug use: Yes    Types: Marijuana    Comment: daily   Sexual activity: Not Currently  Other Topics Concern   Not on file  Social History Narrative   Masters in behavioral health   Live with room mate.   Grew up in [place]  and lived here for since 2014.    Disabled with back pain.    Social Drivers of Corporate investment banker Strain: Low Risk  (04/12/2024)   Overall Financial Resource Strain (CARDIA)    Difficulty of Paying Living Expenses: Not very hard  Food Insecurity: Food Insecurity Present (04/12/2024)   Hunger Vital Sign    Worried About Running Out of Food in the Last Year: Sometimes true    Ran Out of Food in the Last Year: Sometimes true  Transportation Needs: Unknown (04/12/2024)   PRAPARE - Transportation    Lack of Transportation (Medical):  No    Lack of Transportation (Non-Medical): Patient declined  Physical Activity: Inactive (04/12/2024)   Exercise Vital Sign    Days of Exercise per Week: 0 days    Minutes of Exercise per Session: Not on file  Stress: Stress Concern Present (04/12/2024)   [employer] of Occupational Health - Occupational Stress Questionnaire    Feeling of Stress: To some extent  Social Connections: Unknown (04/12/2024)   Social Connection and Isolation Panel  Frequency of Communication with Friends and Family: Never    Frequency of Social Gatherings with Friends and Family: Never    Attends Religious Services: Never    Database administrator or Organizations: No    Attends Engineer, structural: Not on file    Marital Status: Patient declined  Intimate Partner Violence: Not At Risk (11/07/2022)   Humiliation, Afraid, Rape, and Kick questionnaire    Fear of Current or Ex-Partner: No    Emotionally Abused: No    Physically Abused: No    Sexually Abused: No      Review of systems: All other review of systems negative except as mentioned in the HPI.   Physical Exam: Vitals:   04/21/24 0831  BP: 128/72  Pulse: (!) 58  SpO2: 95%   Body mass index is 28.36 kg/m. Gen:      No acute distress HEENT:  sclera anicteric Abd:      soft, non-tender; no palpable masses, no distension Ext:    No edema Neuro: alert and oriented x 3 Psych: normal mood and affect Rectal exam: Normal anal sphincter tone, no anal fissure or external hemorrhoids Anoscopy: Small internal hemorrhoids, no active bleeding, normal dentate line, no visible nodules  Data Reviewed:  Reviewed labs, radiology imaging, old records and pertinent past GI work up   Assessment and Plan Assessment & Plan Internal hemorrhoids with minor rectal bleeding Minor rectal bleeding on toilet paper, not mixed with stool, occurred twice this month. No associated pain or changes in bowel habits. Examination reveals small  internal hemorrhoids with a pinpoint bleeding site, likely due to irritation from hard stool. No tears or fissures noted. Hemorrhoids are not severe. Bleeding is likely from hemorrhoids and not indicative of a more serious condition. Given family history of rectal cancer, she is understandably concerned but reassured that current symptoms are consistent with hemorrhoidal bleeding. - Prescribe Anusol suppository for use at bedtime for 3-4 days as needed to shrink hemorrhoids. - Recommend starting Benefiber, a soluble fiber, one tablespoon with each meal to improve bowel regularity and prevent hard stools. - Advise that if bleeding becomes frequent, hemorrhoid removal can be considered with hemorrhoid band ligation.  Personal history of colonic polyps, status post removal Previous removal of colonic polyps. Last colonoscopy in 2024 showed a very small polyp, following removal of an 18mm polyp in 2023. Polyps grow slowly, and routine exams help prevent progression to cancer. Current plan is to repeat colonoscopy in 2029, but can be done sooner if needed due to symptoms or family history concerns. She is diligent with screenings, reducing her risk. - Plan to repeat colonoscopy in September 2029 unless symptoms warrant earlier examination.  Return in 6 months   This visit required 30 minutes of patient care (this includes precharting, chart review, review of results, face-to-face time used for counseling as well as treatment plan and follow-up. The patient was provided an opportunity to ask questions and all were answered. The patient agreed with the plan and demonstrated an understanding of the instructions.  LOIS Wilkie Mcgee , MD    CC: Mercer Clotilda SAUNDERS, MD

## 2024-04-21 NOTE — Patient Instructions (Addendum)
 VISIT SUMMARY:  You visited us  today due to rectal bleeding. We discussed your symptoms, examined you, and provided a treatment plan to address your internal hemorrhoids and maintain your bowel health.  YOUR PLAN:  INTERNAL HEMORRHOIDS WITH MINOR RECTAL BLEEDING: You experienced minor rectal bleeding twice this month, likely due to small internal hemorrhoids. There is no associated pain or changes in bowel habits. -Use Anusol suppository at bedtime for 3-4 days as needed to relieve symptoms from hemorrhoids. -Start taking Benefiber, a soluble fiber, one tablespoon with each meal to improve bowel regularity and prevent hard stools. -If bleeding becomes frequent, hemorrhoid removal can be considered.  PERSONAL HISTORY OF COLONIC POLYPS, STATUS POST REMOVAL: You have a history of colonic polyps, with the last colonoscopy in 2024 showing a very small polyp. Routine exams help prevent progression to cancer. -Plan to repeat colonoscopy in 2029 unless symptoms warrant earlier examination.  _______________________________________________________  If your blood pressure at your visit was 140/90 or greater, please contact your primary care physician to follow up on this.  _______________________________________________________  If you are age 14 or older, your body mass index should be between 23-30. Your Body mass index is 28.36 kg/m. If this is out of the aforementioned range listed, please consider follow up with your Primary Care Provider.  If you are age 14 or younger, your body mass index should be between 19-25. Your Body mass index is 28.36 kg/m. If this is out of the aformentioned range listed, please consider follow up with your Primary Care Provider.   ________________________________________________________  The [facility] providers would like to encourage you to use MYCHART to communicate with providers for non-urgent requests or questions.  Due to long hold times on the telephone,  sending your provider a message by [facility] may be a faster and more efficient way to get a response.  Please allow 48 business hours for a response.  Please remember that this is for non-urgent requests.  _______________________________________________________  [facility] is using a team-based approach to care.  Your team is made up of your doctor and two to three APPS. Our APPS (Nurse Practitioners and Physician Assistants) work with your physician to ensure care continuity for you. They are fully qualified to address your health concerns and develop a treatment plan. They communicate directly with your gastroenterologist to care for you. Seeing the Advanced Practice Practitioners on your physician's team can help you by facilitating care more promptly, often allowing for earlier appointments, access to diagnostic testing, procedures, and other specialty referrals.    I appreciate the  opportunity to care for you  Thank You   Kavitha Nandigam , MD

## 2024-04-27 ENCOUNTER — Ambulatory Visit: Payer: Self-pay | Admitting: Family Medicine

## 2024-04-27 ENCOUNTER — Other Ambulatory Visit: Payer: Self-pay | Admitting: Family Medicine

## 2024-05-31 ENCOUNTER — Encounter: Payer: Self-pay | Admitting: Family Medicine

## 2024-05-31 DIAGNOSIS — G8929 Other chronic pain: Secondary | ICD-10-CM

## 2024-06-07 ENCOUNTER — Ambulatory Visit: Admitting: Sports Medicine

## 2024-06-07 ENCOUNTER — Other Ambulatory Visit: Payer: Self-pay

## 2024-06-07 ENCOUNTER — Ambulatory Visit: Admitting: Orthopaedic Surgery

## 2024-06-07 DIAGNOSIS — G8929 Other chronic pain: Secondary | ICD-10-CM | POA: Diagnosis not present

## 2024-06-07 DIAGNOSIS — M7501 Adhesive capsulitis of right shoulder: Secondary | ICD-10-CM

## 2024-06-07 DIAGNOSIS — M25511 Pain in right shoulder: Secondary | ICD-10-CM | POA: Diagnosis not present

## 2024-06-07 MED ORDER — BUPIVACAINE HCL 0.25 % IJ SOLN
2.0000 mL | INTRAMUSCULAR | Status: AC | PRN
  Administered 2024-06-07: 2 mL via INTRA_ARTICULAR

## 2024-06-07 MED ORDER — METHYLPREDNISOLONE ACETATE 40 MG/ML IJ SUSP
80.0000 mg | INTRAMUSCULAR | Status: AC | PRN
  Administered 2024-06-07: 80 mg via INTRA_ARTICULAR

## 2024-06-07 MED ORDER — LIDOCAINE HCL 1 % IJ SOLN
2.0000 mL | INTRAMUSCULAR | Status: AC | PRN
  Administered 2024-06-07: 2 mL

## 2024-06-07 NOTE — Progress Notes (Signed)
 Office Visit Note   Patient: Donna Woodward           Date of Birth: 11/09/1955           MRN: [id number] Visit Date: 06/07/2024              Requested by: Mercer Clotilda JONELLE, MD [address] [place],  [place] [address] PCP: Mercer Clotilda JONELLE, MD   Assessment & Plan: Visit Diagnoses:  1. Chronic right shoulder pain     Plan: History of Present Illness Torina R Narang [name] is a 68 year old female who presents with right shoulder pain.  She has had right shoulder pain for about nine months after a stretching incident when she felt a pop. The pain is across the top of the shoulder and throbbing, without numbness or tingling.  Shoulder X-rays were obtained in October. Pain is triggered by reaching across her body and flexing the shoulder, limiting tasks such as washing her car and reaching household shelves.  She notes decreased right shoulder range of motion compared to the left. She can only reach her right hand to her buttock, while the left reaches the shoulder blade. She does not have diabetes.  Physical Exam MUSCULOSKELETAL: Right shoulder external rotation to 50 degrees with pain. Right shoulder forward flexion to 135 degrees. Mild Hawkins impingement test on right shoulder. Right shoulder rotator cuff strength is good. Left shoulder exhibits full range of motion. Right hand reach is limited to the buttock. Left hand reach extends to the shoulder blade.  Results RADIOLOGY Shoulder X-ray: Glenohumeral joint normal, acromioclavicular [facility]) joint arthritis, rotator cuff calcification (03/2024)  Assessment and Plan Adhesive capsulitis of right shoulder Adhesive capsulitis secondary to rotator cuff strain with inflammation. Limited range of motion and pain, especially with external rotation and reaching behind. Good rotator cuff strength. Self-resolution expected over time. - Plan for cortisone injection with Doctor Burnetta. - Referred to physical therapy.  Follow-Up  Instructions: No follow-ups on file.   Orders:  Orders Placed This Encounter  Procedures   Ambulatory referral to Physical Therapy   No orders of the defined types were placed in this encounter.     Procedures: No procedures performed   Clinical Data: No additional findings.   Subjective: Chief Complaint  Patient presents with   Right Shoulder - Pain    HPI  Review of Systems  Constitutional: Negative.   HENT: Negative.    Eyes: Negative.   Respiratory: Negative.    Cardiovascular: Negative.   Endocrine: Negative.   Musculoskeletal: Negative.   Neurological: Negative.   Hematological: Negative.   Psychiatric/Behavioral: Negative.    All other systems reviewed and are negative.    Objective: Vital Signs: There were no vitals taken for this visit.  Physical Exam Vitals and nursing note reviewed.  Constitutional:      Appearance: She is well-developed.  HENT:     Head: Atraumatic.     Nose: Nose normal.  Eyes:     Extraocular Movements: Extraocular movements intact.  Cardiovascular:     Pulses: Normal pulses.  Pulmonary:     Effort: Pulmonary effort is normal.  Abdominal:     Palpations: Abdomen is soft.  Musculoskeletal:     Cervical back: Neck supple.  Skin:    General: Skin is warm.     Capillary Refill: Capillary refill takes less than 2 seconds.  Neurological:     Mental Status: She is alert. Mental status is at baseline.  Psychiatric:  Behavior: Behavior normal.        Thought Content: Thought content normal.        Judgment: Judgment normal.     Ortho Exam  Specialty Comments:  No specialty comments available.  Imaging: No results found.   PMFS History: Patient Active Problem List   Diagnosis Date Noted   History of colonic polyps 01/12/2023   Colitis with rectal bleeding 10/31/2022   Acute kidney injury superimposed on chronic kidney disease 10/31/2022   AAA (abdominal aortic aneurysm) without rupture 10/31/2022    Near syncope 05/02/2020   Anxiety 11/06/2017   S/P CABG x 3 06/17/2016   Family history of early CAD 06/04/2016   Chest pain 06/04/2016   Abnormal nuclear stress test 06/04/2016   Degenerative joint disease (DJD) of lumbar spine 03/24/2016   Depression 03/24/2016   Obesity 08/14/2015   Essential hypertension 04/29/2015   Hyperlipidemia 04/29/2015   Past Medical History:  Diagnosis Date   Anxiety    Back pain    Cataract    Coronary artery disease    CABGx3; Seen by Dr. Delford   Degenerative joint disease (DJD) of lumbar spine 03/24/2016   Depression    GERD (gastroesophageal reflux disease)    if overweight   Hyperlipidemia    Hypertension     Family History  Problem Relation Age of Onset   Hypertension Mother    Stroke Father    Heart disease Father    Dementia Father    Hypertension Sister    CAD Sister 72       cabg   Colon cancer Sister    Rectal cancer Sister    Hypertension Sister    Cancer Sister 55       rectal   Cancer - Cervical Sister        CYST ON CERVIX  APRIL 2024   Cancer - Other Sister        lymph node stage 3 cancer sept 2024   Hypertension Sister    Stomach cancer Neg Hx    Colon polyps Neg Hx    Esophageal cancer Neg Hx    Pancreatic cancer Neg Hx     Past Surgical History:  Procedure Laterality Date   BACK SURGERY     CARDIAC CATHETERIZATION N/A 06/05/2016   Procedure: Left Heart Cath and Coronary Angiography;  Surgeon: Victory LELON Sharps, MD;  Location: [facility] INVASIVE CV LAB;  Service: Cardiovascular;  Laterality: N/A;   COLONOSCOPY     CORONARY ARTERY BYPASS GRAFT N/A 06/17/2016   Procedure: CORONARY ARTERY BYPASS GRAFTING (CABG), ON PUMP, TIMES THREE, USING LEFT INTERNAL MAMMARY ARTERY, RIGHT GREATER SAPHENOUS VEIN HARVESTED ENDOSCOPICALLY;  Surgeon: Maude Fleeta Ochoa, MD;  Location: [facility] OR;  Service: Open Heart Surgery;  Laterality: N/A;   MULTIPLE TOOTH EXTRACTIONS     SPINE SURGERY  1990   lamnectomy L5S1   TEE WITHOUT CARDIOVERSION N/A  06/17/2016   Procedure: TRANSESOPHAGEAL ECHOCARDIOGRAM (TEE);  Surgeon: Maude Fleeta Ochoa, MD;  Location: [facility] OR;  Service: Open Heart Surgery;  Laterality: N/A;   Social History   Occupational History   Occupation: disabled    Comment: warehouse  Tobacco Use   Smoking status: Former    Current packs/day: 0.00    Types: Cigarettes    Quit date: 12/04/2006    Years since quitting: 17.5   Smokeless tobacco: Never  Vaping Use   Vaping status: Never Used  Substance and Sexual Activity   Alcohol use: Yes  Comment: occasional   Drug use: Yes    Types: Marijuana    Comment: daily   Sexual activity: Not Currently

## 2024-06-07 NOTE — Progress Notes (Signed)
   Procedure Note  Patient: Donna Woodward             Date of Birth: 22-Aug-1955           MRN: [id number]             Visit Date: 06/07/2024  Procedures: Visit Diagnoses:  1. Chronic right shoulder pain   2. Adhesive capsulitis of right shoulder    Lab Results  Component Value Date   HGBA1C 6.0 10/15/2023   Large Joint Inj: R glenohumeral on 06/07/2024 11:38 AM Indications: pain Details: 22 G 3.5 in needle, ultrasound-guided posterior approach Medications: 2 mL lidocaine  1 %; 2 mL bupivacaine  0.25 %; 80 mg methylPREDNISolone  acetate 40 MG/ML Outcome: tolerated well, no immediate complications  US -guided glenohumeral joint injection, right shoulder After discussion on risks/benefits/indications, informed verbal consent was obtained. A timeout was then performed. The patient was positioned lying lateral recumbent on examination table. The patient's shoulder was prepped with betadine and multiple alcohol swabs and utilizing ultrasound guidance, the patient's glenohumeral joint was identified on ultrasound. Using ultrasound guidance a 22-gauge, 3.5 inch needle with a mixture of 2:2:2 cc's lidocaine :bupivicaine:depomedrol was directed from a lateral to medial direction via in-plane technique into the glenohumeral joint with visualization of appropriate spread of injectate into the joint. Patient tolerated the procedure well without immediate complications.      Procedure, treatment alternatives, risks and benefits explained, specific risks discussed. Consent was given by the patient. Immediately prior to procedure a time out was called to verify the correct patient, procedure, equipment, support staff and site/side marked as required. Patient was prepped and draped in the usual sterile fashion.     *Shoulder (R) ROM is as follows: - Flexion: 140 degrees - Abduction: 110 degrees - External rotation: 30 degrees - Internal rotation: Thumb to L4 (compared to T6 of contralateral  arm)   - She will follow-up with me in the next 2-3 weeks for reevaluation - We will recheck her range of motion and her function, could consider second additional high-volume glenohumeral joint injection if not significantly improved - Sinew through formalized physical therapy and HEP, we will ultimately get her back to Dr. Jerri Lonell Sprang, DO Primary Care Sports Medicine Physician  [facility]  This note was dictated using Dragon naturally speaking software and may contain errors in syntax, spelling, or content which have not been identified prior to signing this note.

## 2024-06-16 ENCOUNTER — Encounter: Payer: Self-pay | Admitting: Family Medicine

## 2024-06-21 ENCOUNTER — Encounter: Payer: Self-pay | Admitting: Sports Medicine

## 2024-06-21 ENCOUNTER — Ambulatory Visit (INDEPENDENT_AMBULATORY_CARE_PROVIDER_SITE_OTHER): Admitting: Physical Therapy

## 2024-06-21 ENCOUNTER — Ambulatory Visit: Admitting: Sports Medicine

## 2024-06-21 ENCOUNTER — Encounter: Payer: Self-pay | Admitting: Physical Therapy

## 2024-06-21 DIAGNOSIS — M25511 Pain in right shoulder: Secondary | ICD-10-CM

## 2024-06-21 DIAGNOSIS — M6281 Muscle weakness (generalized): Secondary | ICD-10-CM | POA: Diagnosis not present

## 2024-06-21 DIAGNOSIS — M7501 Adhesive capsulitis of right shoulder: Secondary | ICD-10-CM | POA: Diagnosis not present

## 2024-06-21 DIAGNOSIS — R293 Abnormal posture: Secondary | ICD-10-CM | POA: Diagnosis not present

## 2024-06-21 DIAGNOSIS — G8929 Other chronic pain: Secondary | ICD-10-CM

## 2024-06-21 NOTE — Therapy (Signed)
 " OUTPATIENT PHYSICAL THERAPY EVALUATION   Patient Name: Donna Woodward MRN: [id number] DOB:Mar 25, 1956, 68 y.o., female Today's Date: 06/21/2024  END OF SESSION:  PT End of Session - 06/21/24 1136     Visit Number 1    Number of Visits 12    Date for Recertification  08/02/24    Authorization Type [employer]    Progress Note Due on Visit 10    PT Start Time 1100    PT Stop Time 1134    PT Time Calculation (min) 34 min    Activity Tolerance Patient tolerated treatment well    Behavior During Therapy [facility] for tasks assessed/performed          Past Medical History:  Diagnosis Date   Anxiety    Back pain    Cataract    Coronary artery disease    CABGx3; Seen by Dr. Delford   Degenerative joint disease (DJD) of lumbar spine 03/24/2016   Depression    GERD (gastroesophageal reflux disease)    if overweight   Hyperlipidemia    Hypertension    Past Surgical History:  Procedure Laterality Date   BACK SURGERY     CARDIAC CATHETERIZATION N/A 06/05/2016   Procedure: Left Heart Cath and Coronary Angiography;  Surgeon: Victory LELON Sharps, MD;  Location: [facility] INVASIVE CV LAB;  Service: Cardiovascular;  Laterality: N/A;   COLONOSCOPY     CORONARY ARTERY BYPASS GRAFT N/A 06/17/2016   Procedure: CORONARY ARTERY BYPASS GRAFTING (CABG), ON PUMP, TIMES THREE, USING LEFT INTERNAL MAMMARY ARTERY, RIGHT GREATER SAPHENOUS VEIN HARVESTED ENDOSCOPICALLY;  Surgeon: Maude Fleeta Ochoa, MD;  Location: [facility] OR;  Service: Open Heart Surgery;  Laterality: N/A;   MULTIPLE TOOTH EXTRACTIONS     SPINE SURGERY  1990   lamnectomy L5S1   TEE WITHOUT CARDIOVERSION N/A 06/17/2016   Procedure: TRANSESOPHAGEAL ECHOCARDIOGRAM (TEE);  Surgeon: Maude Fleeta Ochoa, MD;  Location: [facility] OR;  Service: Open Heart Surgery;  Laterality: N/A;   Patient Active Problem List   Diagnosis Date Noted   History of colonic polyps 01/12/2023   Colitis with rectal bleeding 10/31/2022   Acute kidney injury superimposed on chronic kidney disease  10/31/2022   AAA (abdominal aortic aneurysm) without rupture 10/31/2022   Near syncope 05/02/2020   Anxiety 11/06/2017   S/P CABG x 3 06/17/2016   Family history of early CAD 06/04/2016   Chest pain 06/04/2016   Abnormal nuclear stress test 06/04/2016   Degenerative joint disease (DJD) of lumbar spine 03/24/2016   Depression 03/24/2016   Obesity 08/14/2015   Essential hypertension 04/29/2015   Hyperlipidemia 04/29/2015    PCP: Mercer Clotilda JONELLE, MD  REFERRING PROVIDER: Jerri Kay HERO, MD  REFERRING DIAG: [phone] (ICD-10-CM) - Chronic right shoulder pain  Rationale for Evaluation and Treatment: Rehabilitation  THERAPY DIAG:  Chronic right shoulder pain - Plan: PT plan of care cert/re-cert  Abnormal posture - Plan: PT plan of care cert/re-cert  Muscle weakness (generalized) - Plan: PT plan of care cert/re-cert  ONSET DATE: chronic x 9 months   SUBJECTIVE:  SUBJECTIVE STATEMENT: Pt reports in March she stretched overhead and felt a pop.  Since then she has had continued pain and stiffness, as well as difficulty sleeping on Rt side.    PERTINENT HISTORY:  AAA, CAD s/p CABG x 3, DJD, depression, obesity, anxiety, HTN  PAIN:  Are you having pain? Yes: NPRS scale: 4 currently, up to 4, at best 0/10 Pain location: Rt shoulder - deep Pain description: deep aching, some muscle pain Aggravating factors: sleeping on Rt side, reaching overhead Relieving factors: avoiding provoking factors, ice  PRECAUTIONS:  None  RED FLAGS: None   WEIGHT BEARING RESTRICTIONS:  No  FALLS:  Has patient fallen in last 6 months? No  LIVING ENVIRONMENT: Lives with: lives alone Lives in: Mobile home Stairs: No   OCCUPATION:  Retired  PLOF:  Independent and Leisure: fishing, no regular exercise    PATIENT GOALS:  Improve pain and mobility   OBJECTIVE:  Note: Objective measures were completed at Evaluation unless otherwise noted.  PATIENT SURVEYS:  Patient-Specific Activity Scoring Scheme  0 represents unable to perform. 10 represents able to perform at prior level. 0 1 2 3 4 5 6 7 8 9  10 (Date and Score)   Activity Eval     1. Reaching for things on upper shelves 6    2. Washing back  6    3. Sleeping on Rt side 6   4. Putting shirt/sweatshirt on 6   Score 6    Total score = sum of the activity scores/number of activities Minimum detectable change (90%CI) for average score = 2 points Minimum detectable change (90%CI) for single activity score = 3 points    COGNITIVE STATUS: Within functional limits for tasks assessed   SENSATION: WFL  POSTURE:  rounded shoulders and forward head  HAND DOMINANCE:  Right  GAIT: 06/21/2024 Comments: independent   UPPER EXTREMITY ROM: measured sitting  ROM Right eval Left eval  Shoulder flexion A: 119 P: 142 WNL  Shoulder abduction A: 120 P: 145 WNL  Shoulder internal rotation T12 WNL  Shoulder external rotation A: 58 A: 64   (Blank rows = not tested)   UPPER EXTREMITY MMT:  MMT Right eval Left eval  Shoulder flexion 3/5 with pain 3/5  Shoulder extension    Shoulder abduction 3/5 with pain 3/5  Shoulder adduction    Shoulder extension    Shoulder internal rotation 5/5 5/5  Shoulder external rotation 3/5 3/5   (Blank rows = not tested)    SPECIAL TESTS:  06/21/2024 Upper Extremity Rotator cuff assessment: Empty can test: positive  and Full can test: negative  TREATMENT:                                                                                                                              DATE:  06/21/2024 TherEx See HEP - demonstrated with trial reps performed PRN, move cues for comprehension    Self Care Educated on  PT POC and clinical findings   PATIENT EDUCATION:  Education  details: HEP Person educated: Patient Education method: Explanation, Demonstration, and Handouts Education comprehension: verbalized understanding, returned demonstration, and needs further education  HOME EXERCISE PROGRAM: Access Code: [email or web address] URL: [email or web address] Date: 06/21/2024 Prepared by: Corean Ku  Exercises - Standing Shoulder Flexion AAROM with Dowel  - 2-3 x daily - 7 x weekly - 1 sets - 10 reps - 1-2 sec hold - Standing Shoulder Abduction ROM with Dowel  - 2-3 x daily - 7 x weekly - 1 sets - 10 reps - 1-2 sec hold - Standing Shoulder Internal Rotation AAROM with Dowel  - 2-3 x daily - 7 x weekly - 1 sets - 10 reps - 1-2 sec hold - Seated Scapular Retraction  - 2-3 x daily - 7 x weekly - 1 sets - 10 reps - 5 sec hold   ASSESSMENT:  CLINICAL IMPRESSION: Patient is a 68 y.o. female who was seen today for physical therapy evaluation and treatment for Rt shoulder pain. She demonstrates ROM and strength deficits, as well as postural abnormalities and continued pain affecting functional mobility.  She will benefit from PT to address deficits listed.     OBJECTIVE IMPAIRMENTS: decreased ROM, decreased strength, increased fascial restrictions, impaired UE functional use, postural dysfunction, and pain.   ACTIVITY LIMITATIONS: carrying, lifting, sleeping, bed mobility, bathing, toileting, dressing, reach over head, and hygiene/grooming  PARTICIPATION LIMITATIONS: meal prep, cleaning, laundry, driving, shopping, and community activity  PERSONAL FACTORS: Age, Behavior pattern, Past/current experiences, Time since onset of injury/illness/exacerbation, and 3+ comorbidities: AAA, CAD s/p CABG x 3, DJD, depression, obesity, anxiety, HTN are also affecting patient's functional outcome.   REHAB POTENTIAL: Good  CLINICAL DECISION MAKING: Evolving/moderate complexity  EVALUATION COMPLEXITY: Moderate   GOALS: Goals reviewed with patient? Yes  SHORT TERM  GOALS: Target date: 07/12/2024   Independent with initial HEP Goal status: INITIAL   LONG TERM GOALS: Target date: 08/02/2024  Independent with final HEP Goal status: INITIAL  2.  PSFS score improved by 2 points Goal status: INITIAL  3.  Rt shoulder AROM improved to [facility] for improved function and pain Goal status: INIITAL  4.  Report pain < 3/10 with overhead reaching activities for improved function Goal status: INITIAL  5.  Report 75% improvement in sleeping on Rt side for improved sleep quality and function Goal status: INITIAL  6.  Improve Rt shoulder strength to at least 4/5 for improved function Goal status: INITIAL     PLAN:  PT FREQUENCY: 1-2x/week  PT DURATION: 6 weeks  PLANNED INTERVENTIONS: 97164- PT Re-evaluation, 97750- Physical Performance Testing, 97110-Therapeutic exercises, 97530- Therapeutic activity, 97112- Neuromuscular re-education, 97535- Self Care, 02859- Manual therapy, [id number]- Aquatic Therapy, H9716- Electrical stimulation (unattended), [id number]- Ultrasound, [id number]- Ionotophoresis 4mg /ml Dexamethasone, 79439 (1-2 muscles), 20561 (3+ muscles)- Dry Needling, Patient/Family education, Taping, Joint mobilization, Joint manipulation, Cryotherapy, and Moist heat.  PLAN FOR NEXT SESSION: Review HEP, postural exercises; work on ROM and then work to incorporate strengthening as tolerated   NEXT MD VISIT: 06/21/24   Corean JULIANNA Ku, PT, DPT 06/21/2024 11:42 AM    Referring diagnosis? M25.511,G89.29 Treatment diagnosis? (if different than referring diagnosis) M25.511, G89.29, R29.3, M62.81 What was this (referring dx) caused by? []  Surgery []  Fall [x]  Ongoing issue [x]  Arthritis []  Other: ____________  Laterality: [x]  Rt []  Lt []  Both  Check all possible CPT codes:  *CHOOSE 10 OR LESS*    See Planned Interventions listed in the Plan section of  the Evaluation.   "

## 2024-06-21 NOTE — Progress Notes (Signed)
 "  HADDIE BRUHL - 68 y.o. female MRN [id number]  Date of birth: 06/08/1956  Office Visit Note: Visit Date: 06/21/2024 PCP: Mercer Clotilda JONELLE, MD Referred by: Jerri Kay HERO, MD  Subjective: Chief Complaint  Patient presents with   Right Shoulder - Follow-up   HPI: Lateya XANA BRADT is a pleasant 68 y.o. female who presents today for chronic right shoulder pain with adhesive capsulitis.  [name] has made some improvements in her shoulder pain since her last visit.  She did see Dr. Jerri on 06/07/2024 and I did perform a same-day ultrasound-guided glenohumeral joint injection.  She had good relief for the first few days although her pain is somewhat returned although certainly better.  She is able to sleep with less interruptions.  She does notice some improvements in range of motion.  She has her first session of formalized physical therapy today.  Pertinent ROS were reviewed with the patient and found to be negative unless otherwise specified above in HPI.   Assessment & Plan: Visit Diagnoses:  1. Adhesive capsulitis of right shoulder   2. Chronic right shoulder pain    Plan: Impression is chronic right shoulder pain with a most consistent diagnosis of adhesive capsulitis, although there is some underlying rotator cuff arthropathy as well.  She did have some improvement in pain and range of motion from an ultrasound-guided glenohumeral joint injection a few weeks ago.  She has about 10-20 degrees of improved active and passive range of motion in all directions since the injection.  Given the improvement in the setting that she has not really started physical therapy, I do not think we need an additional injection at this point.  I would like her to continue through physical therapy as well as stay consistent with a daily home exercise regimen to help stretch out the capsule of her shoulder pain and improve function.  She will follow-up with her original physician, Dr. Jerri in about 6 weeks for  reevaluation.  Okay for over-the-counter anti-inflammatories only as needed.  I will see her back as needed.  Follow-up: Return in about 6 weeks (around 08/02/2024) for F/u with Dr. Jerri for R-frozen shoulder .   Meds & Orders: No orders of the defined types were placed in this encounter.  No orders of the defined types were placed in this encounter.    Procedures: No procedures performed      Clinical History: No specialty comments available.  She reports that she quit smoking about 17 years ago. Her smoking use included cigarettes. She has never used smokeless tobacco.  Recent Labs    10/15/23 1057  HGBA1C 6.0    Objective:    Physical Exam  Gen: Well-appearing, in no acute distress; non-toxic CV: Well-perfused. Warm.  Resp: Breathing unlabored on room air; no wheezing. Psych: Fluid speech in conversation; appropriate affect; normal thought process  Ortho Exam - Right shoulder: No specific redness swelling or effusion.  There is tenderness over the shoulder and the acromial edge.  *Shoulder (R) ROM is as follows from last visit to today: - Flexion: 140 --> 160 degrees - Abduction: 110 --> 120 degrees - External rotation: 30 --> 40 degrees - Internal rotation: Thumb to L4 --> Thumb to L1   Imaging:  Narrative & Impression  CLINICAL DATA:  Right shoulder and acromioclavicular joint pain, pain after stretching arm   EXAM: RIGHT SHOULDER - 2+ VIEW   COMPARISON:  None Available.   FINDINGS: There is no evidence of fracture  or dislocation. Moderate acromioclavicular arthrosis. Glenohumeral joint spaces preserved. Calcification adjacent to the expected insertion of the rotator cuff on the greater tuberosity. Soft tissues are unremarkable.   IMPRESSION: 1. No fracture or dislocation of the right shoulder. 2. Moderate acromioclavicular arthrosis. Glenohumeral joint spaces preserved. 3. Calcification adjacent to the expected insertion of the rotator cuff on the greater  tuberosity, consistent with chronic rotator cuff tendinopathy.     Electronically Signed   By: Marolyn JONETTA Jaksch M.D.   On: 04/21/2024 10:54    Past Medical/Family/Surgical/Social History: Medications & Allergies reviewed per EMR, new medications updated. Patient Active Problem List   Diagnosis Date Noted   History of colonic polyps 01/12/2023   Colitis with rectal bleeding 10/31/2022   Acute kidney injury superimposed on chronic kidney disease 10/31/2022   AAA (abdominal aortic aneurysm) without rupture 10/31/2022   Near syncope 05/02/2020   Anxiety 11/06/2017   S/P CABG x 3 06/17/2016   Family history of early CAD 06/04/2016   Chest pain 06/04/2016   Abnormal nuclear stress test 06/04/2016   Degenerative joint disease (DJD) of lumbar spine 03/24/2016   Depression 03/24/2016   Obesity 08/14/2015   Essential hypertension 04/29/2015   Hyperlipidemia 04/29/2015   Past Medical History:  Diagnosis Date   Anxiety    Back pain    Cataract    Coronary artery disease    CABGx3; Seen by Dr. Delford   Degenerative joint disease (DJD) of lumbar spine 03/24/2016   Depression    GERD (gastroesophageal reflux disease)    if overweight   Hyperlipidemia    Hypertension    Family History  Problem Relation Age of Onset   Hypertension Mother    Stroke Father    Heart disease Father    Dementia Father    Hypertension Sister    CAD Sister 13       cabg   Colon cancer Sister    Rectal cancer Sister    Hypertension Sister    Cancer Sister 3       rectal   Cancer - Cervical Sister        CYST ON CERVIX  APRIL 2024   Cancer - Other Sister        lymph node stage 3 cancer sept 2024   Hypertension Sister    Stomach cancer Neg Hx    Colon polyps Neg Hx    Esophageal cancer Neg Hx    Pancreatic cancer Neg Hx    Past Surgical History:  Procedure Laterality Date   BACK SURGERY     CARDIAC CATHETERIZATION N/A 06/05/2016   Procedure: Left Heart Cath and Coronary Angiography;   Surgeon: Victory LELON Sharps, MD;  Location: [facility] INVASIVE CV LAB;  Service: Cardiovascular;  Laterality: N/A;   COLONOSCOPY     CORONARY ARTERY BYPASS GRAFT N/A 06/17/2016   Procedure: CORONARY ARTERY BYPASS GRAFTING (CABG), ON PUMP, TIMES THREE, USING LEFT INTERNAL MAMMARY ARTERY, RIGHT GREATER SAPHENOUS VEIN HARVESTED ENDOSCOPICALLY;  Surgeon: Maude Fleeta Ochoa, MD;  Location: [facility] OR;  Service: Open Heart Surgery;  Laterality: N/A;   MULTIPLE TOOTH EXTRACTIONS     SPINE SURGERY  1990   lamnectomy L5S1   TEE WITHOUT CARDIOVERSION N/A 06/17/2016   Procedure: TRANSESOPHAGEAL ECHOCARDIOGRAM (TEE);  Surgeon: Maude Fleeta Ochoa, MD;  Location: [facility] OR;  Service: Open Heart Surgery;  Laterality: N/A;   Social History   Occupational History   Occupation: disabled    Comment: warehouse  Tobacco Use   Smoking status: Former  Current packs/day: 0.00    Types: Cigarettes    Quit date: 12/04/2006    Years since quitting: 17.5   Smokeless tobacco: Never  Vaping Use   Vaping status: Never Used  Substance and Sexual Activity   Alcohol use: Yes    Comment: occasional   Drug use: Yes    Types: Marijuana    Comment: daily   Sexual activity: Not Currently   "

## 2024-06-21 NOTE — Progress Notes (Signed)
 Patient says that the injection seemed to help her pain for about 3 days and has since returned. She does not feel that she has gotten any of her motion back since the injection. She begins physical therapy today.

## 2024-07-05 ENCOUNTER — Encounter: Payer: Self-pay | Admitting: Physical Therapy

## 2024-07-05 ENCOUNTER — Ambulatory Visit: Admitting: Physical Therapy

## 2024-07-05 DIAGNOSIS — M6281 Muscle weakness (generalized): Secondary | ICD-10-CM

## 2024-07-05 DIAGNOSIS — G8929 Other chronic pain: Secondary | ICD-10-CM | POA: Diagnosis not present

## 2024-07-05 DIAGNOSIS — M25511 Pain in right shoulder: Secondary | ICD-10-CM

## 2024-07-05 DIAGNOSIS — R293 Abnormal posture: Secondary | ICD-10-CM

## 2024-07-05 NOTE — Therapy (Signed)
 " OUTPATIENT PHYSICAL THERAPY TREATMENT   Patient Name: Donna Woodward MRN: [id number] DOB:[birth year], 69 y.o., female Today's Date: 07/05/2024  END OF SESSION:  PT End of Session - 07/05/24 1049     Visit Number 2    Number of Visits 12    Date for Recertification  08/02/24    Authorization Type [employer]    Progress Note Due on Visit 10    PT Start Time 1050    PT Stop Time 1130    PT Time Calculation (min) 40 min    Activity Tolerance Patient tolerated treatment well    Behavior During Therapy [facility] for tasks assessed/performed           Past Medical History:  Diagnosis Date   Anxiety    Back pain    Cataract    Coronary artery disease    CABGx3; Seen by Dr. Delford   Degenerative joint disease (DJD) of lumbar spine 03/24/2016   Depression    GERD (gastroesophageal reflux disease)    if overweight   Hyperlipidemia    Hypertension    Past Surgical History:  Procedure Laterality Date   BACK SURGERY     CARDIAC CATHETERIZATION N/A 06/05/2016   Procedure: Left Heart Cath and Coronary Angiography;  Surgeon: Victory LELON Sharps, MD;  Location: [facility] INVASIVE CV LAB;  Service: Cardiovascular;  Laterality: N/A;   COLONOSCOPY     CORONARY ARTERY BYPASS GRAFT N/A 06/17/2016   Procedure: CORONARY ARTERY BYPASS GRAFTING (CABG), ON PUMP, TIMES THREE, USING LEFT INTERNAL MAMMARY ARTERY, RIGHT GREATER SAPHENOUS VEIN HARVESTED ENDOSCOPICALLY;  Surgeon: Maude Fleeta Ochoa, MD;  Location: [facility] OR;  Service: Open Heart Surgery;  Laterality: N/A;   MULTIPLE TOOTH EXTRACTIONS     SPINE SURGERY  1990   lamnectomy L5S1   TEE WITHOUT CARDIOVERSION N/A 06/17/2016   Procedure: TRANSESOPHAGEAL ECHOCARDIOGRAM (TEE);  Surgeon: Maude Fleeta Ochoa, MD;  Location: [facility] OR;  Service: Open Heart Surgery;  Laterality: N/A;   Patient Active Problem List   Diagnosis Date Noted   History of colonic polyps 01/12/2023   Colitis with rectal bleeding 10/31/2022   Acute kidney injury superimposed on chronic kidney disease  10/31/2022   AAA (abdominal aortic aneurysm) without rupture 10/31/2022   Near syncope 05/02/2020   Anxiety 11/06/2017   S/P CABG x 3 06/17/2016   Family history of early CAD 06/04/2016   Chest pain 06/04/2016   Abnormal nuclear stress test 06/04/2016   Degenerative joint disease (DJD) of lumbar spine 03/24/2016   Depression 03/24/2016   Obesity 08/14/2015   Essential hypertension 04/29/2015   Hyperlipidemia 04/29/2015    PCP: Mercer Clotilda JONELLE, MD  REFERRING PROVIDER: Jerri Kay HERO, MD  REFERRING DIAG: [phone] (ICD-10-CM) - Chronic right shoulder pain  Rationale for Evaluation and Treatment: Rehabilitation  THERAPY DIAG:  Chronic right shoulder pain  Abnormal posture  Muscle weakness (generalized)  ONSET DATE: chronic x 9 months   SUBJECTIVE:  SUBJECTIVE STATEMENT: Feels like her shoulder is worse; going into ER in 90 deg abduction wants to pop; and it seems to be more painful.   PERTINENT HISTORY:  AAA, CAD s/p CABG x 3, DJD, depression, obesity, anxiety, HTN  PAIN:  Are you having pain? Yes: NPRS scale: 0 at rest, up to 7/10 Pain location: Rt shoulder - deep Pain description: deep aching, some muscle pain Aggravating factors: sleeping on Rt side, reaching overhead Relieving factors: avoiding provoking factors, ice  PRECAUTIONS:  None  RED FLAGS: None   WEIGHT BEARING RESTRICTIONS:  No  FALLS:  Has patient fallen in last 6 months? No  LIVING ENVIRONMENT: Lives with: lives alone Lives in: Mobile home Stairs: No   OCCUPATION:  Retired  PLOF:  Independent and Leisure: fishing, no regular exercise   PATIENT GOALS:  Improve pain and mobility   OBJECTIVE:  Note: Objective measures were completed at Evaluation unless otherwise noted.  PATIENT SURVEYS:   Patient-Specific Activity Scoring Scheme  0 represents unable to perform. 10 represents able to perform at prior level. 0 1 2 3 4 5 6 7 8 9  10 (Date and Score)   Activity Eval     1. Reaching for things on upper shelves 6    2. Washing back  6    3. Sleeping on Rt side 6   4. Putting shirt/sweatshirt on 6   Score 6    Total score = sum of the activity scores/number of activities Minimum detectable change (90%CI) for average score = 2 points Minimum detectable change (90%CI) for single activity score = 3 points    COGNITIVE STATUS: Within functional limits for tasks assessed   SENSATION: WFL  POSTURE:  rounded shoulders and forward head  HAND DOMINANCE:  Right  GAIT: 06/21/2024 Comments: independent   UPPER EXTREMITY ROM: measured sitting  ROM Right eval Left eval Right 07/05/24  Shoulder flexion A: 119 P: 142 WNL A: 125  Shoulder abduction A: 120 P: 145 WNL A: 94 (with pain)  Shoulder internal rotation T12 WNL L5  Shoulder external rotation A: 58 A: 64 A: 50   (Blank rows = not tested)   UPPER EXTREMITY MMT:  MMT Right eval Left eval  Shoulder flexion 3/5 with pain 3/5  Shoulder extension    Shoulder abduction 3/5 with pain 3/5  Shoulder adduction    Shoulder extension    Shoulder internal rotation 5/5 5/5  Shoulder external rotation 3/5 3/5   (Blank rows = not tested)    SPECIAL TESTS:  06/21/2024 Upper Extremity Rotator cuff assessment: Empty can test: positive  and Full can test: negative  07/06/23: Speed's Test: Positive; Yergason's Test: Negative  TREATMENT:                                                                                                                              DATE:  07/05/24 TherEx ROM measurements - see above for details Supine AA flexion  with 1# bar 2x10 - increased pain in shoulder Seated scapular retraction 2x10; 3 sec hold Seated ER with scapular retraction 2x10; 3 sec hold; L1 band Wall ladder x 10 reps;  flexion and abduction on Rt UE ranger flexion x 10 reps - single episode of shoulder pop with descending (pt able to continue)  06/21/2024 TherEx See HEP - demonstrated with trial reps performed PRN, move cues for comprehension    Self Care Educated on PT POC and clinical findings   PATIENT EDUCATION:  Education details: HEP Person educated: Patient Education method: Programmer, Multimedia, Facilities Manager, and Handouts Education comprehension: verbalized understanding, returned demonstration, and needs further education  HOME EXERCISE PROGRAM: Access Code: [email or web address] URL: [email or web address] Date: 06/21/2024 Prepared by: Corean Ku  Exercises - Standing Shoulder Flexion AAROM with Dowel  - 2-3 x daily - 7 x weekly - 1 sets - 10 reps - 1-2 sec hold - Standing Shoulder Abduction ROM with Dowel  - 2-3 x daily - 7 x weekly - 1 sets - 10 reps - 1-2 sec hold - Standing Shoulder Internal Rotation AAROM with Dowel  - 2-3 x daily - 7 x weekly - 1 sets - 10 reps - 1-2 sec hold - Seated Scapular Retraction  - 2-3 x daily - 7 x weekly - 1 sets - 10 reps - 5 sec hold   ASSESSMENT:  CLINICAL IMPRESSION: Pt reporting increased pain and decreased function today which began about 1-2 weeks ago.  Reviewed HEP with modifications as pt could tolerate supine and wall walks better than standing cane exercises.  Plan to adjust HEP and see if this helps.  If not, recommend possible return to physician to discuss additional treatment options.    OBJECTIVE IMPAIRMENTS: decreased ROM, decreased strength, increased fascial restrictions, impaired UE functional use, postural dysfunction, and pain.   ACTIVITY LIMITATIONS: carrying, lifting, sleeping, bed mobility, bathing, toileting, dressing, reach over head, and hygiene/grooming  PARTICIPATION LIMITATIONS: meal prep, cleaning, laundry, driving, shopping, and community activity  PERSONAL FACTORS: Age, Behavior pattern, Past/current experiences,  Time since onset of injury/illness/exacerbation, and 3+ comorbidities: AAA, CAD s/p CABG x 3, DJD, depression, obesity, anxiety, HTN are also affecting patient's functional outcome.   REHAB POTENTIAL: Good  CLINICAL DECISION MAKING: Evolving/moderate complexity  EVALUATION COMPLEXITY: Moderate   GOALS: Goals reviewed with patient? Yes  SHORT TERM GOALS: Target date: 07/12/2024   Independent with initial HEP Goal status: INITIAL   LONG TERM GOALS: Target date: 08/02/2024  Independent with final HEP Goal status: INITIAL  2.  PSFS score improved by 2 points Goal status: INITIAL  3.  Rt shoulder AROM improved to [facility] for improved function and pain Goal status: INIITAL  4.  Report pain < 3/10 with overhead reaching activities for improved function Goal status: INITIAL  5.  Report 75% improvement in sleeping on Rt side for improved sleep quality and function Goal status: INITIAL  6.  Improve Rt shoulder strength to at least 4/5 for improved function Goal status: INITIAL     PLAN:  PT FREQUENCY: 1-2x/week  PT DURATION: 6 weeks  PLANNED INTERVENTIONS: 97164- PT Re-evaluation, 97750- Physical Performance Testing, 97110-Therapeutic exercises, 97530- Therapeutic activity, 97112- Neuromuscular re-education, 97535- Self Care, 02859- Manual therapy, [phone]- Aquatic Therapy, H9716- Electrical stimulation (unattended), 97035- Ultrasound, [id number]- Ionotophoresis 4mg /ml Dexamethasone, 79439 (1-2 muscles), 20561 (3+ muscles)- Dry Needling, Patient/Family education, Taping, Joint mobilization, Joint manipulation, Cryotherapy, and Moist heat.  PLAN FOR NEXT SESSION: is pain better?, check ROM, postural exercises; work on ROM and then work  to incorporate strengthening as tolerated   NEXT MD VISIT: 06/21/24   Corean JULIANNA Ku, PT, DPT 07/05/2024 11:37 AM    Referring diagnosis? M25.511,G89.29 Treatment diagnosis? (if different than referring diagnosis) M25.511, G89.29, R29.3,  M62.81 What was this (referring dx) caused by? []  Surgery []  Fall [x]  Ongoing issue [x]  Arthritis []  Other: ____________  Laterality: [x]  Rt []  Lt []  Both  Check all possible CPT codes:  *CHOOSE 10 OR LESS*    See Planned Interventions listed in the Plan section of the Evaluation.   "

## 2024-07-12 ENCOUNTER — Encounter: Admitting: Physical Therapy

## 2024-07-19 ENCOUNTER — Encounter: Admitting: Physical Therapy

## 2024-07-20 ENCOUNTER — Ambulatory Visit: Admitting: Orthopaedic Surgery

## 2024-07-20 DIAGNOSIS — M7501 Adhesive capsulitis of right shoulder: Secondary | ICD-10-CM

## 2024-07-20 NOTE — Progress Notes (Signed)
 "  Office Visit Note   Patient: Donna Woodward           Date of Birth: 1955-09-03           MRN: [id number] Visit Date: 07/20/2024              Requested by: Mercer Clotilda JONELLE, MD [address] [place],  [place] [address] PCP: Mercer Clotilda JONELLE, MD   Assessment & Plan: Visit Diagnoses:  1. Adhesive capsulitis of right shoulder     Plan: Impression is chronic right shoulder pain with temporary relief from intra-articular steroid injection and physical therapy.  At this point we will order MRI to rule out structural abnormalities.  Follow-up after the MRI.  Follow-Up Instructions: Return for after MRI.   Orders:  No orders of the defined types were placed in this encounter.  No orders of the defined types were placed in this encounter.     Procedures: No procedures performed   Clinical Data: No additional findings.   Subjective: Chief Complaint  Patient presents with   Right Shoulder - Pain    HPI Patient returns today for evaluation of right shoulder pain.  She felt relief for about 3 days from steroid injection on 06/21/2024.  She has been doing physical therapy.  She has not felt any improvement. Review of Systems  Constitutional: Negative.   HENT: Negative.    Eyes: Negative.   Respiratory: Negative.    Cardiovascular: Negative.   Endocrine: Negative.   Musculoskeletal: Negative.   Neurological: Negative.   Hematological: Negative.   Psychiatric/Behavioral: Negative.    All other systems reviewed and are negative.    Objective: Vital Signs: There were no vitals taken for this visit.  Physical Exam Vitals and nursing note reviewed.  Constitutional:      Appearance: She is well-developed.  HENT:     Head: Atraumatic.     Nose: Nose normal.  Eyes:     Extraocular Movements: Extraocular movements intact.  Cardiovascular:     Pulses: Normal pulses.  Pulmonary:     Effort: Pulmonary effort is normal.  Abdominal:     Palpations: Abdomen is  soft.  Musculoskeletal:     Cervical back: Neck supple.  Skin:    General: Skin is warm.     Capillary Refill: Capillary refill takes less than 2 seconds.  Neurological:     Mental Status: She is alert. Mental status is at baseline.  Psychiatric:        Behavior: Behavior normal.        Thought Content: Thought content normal.        Judgment: Judgment normal.     Ortho Exam Examination of right shoulder shows restricted forward flexion abduction and external rotation with pain. Specialty Comments:  No specialty comments available.  Imaging: No results found.   PMFS History: Patient Active Problem List   Diagnosis Date Noted   History of colonic polyps 01/12/2023   Colitis with rectal bleeding 10/31/2022   Acute kidney injury superimposed on chronic kidney disease 10/31/2022   AAA (abdominal aortic aneurysm) without rupture 10/31/2022   Near syncope 05/02/2020   Anxiety 11/06/2017   S/P CABG x 3 06/17/2016   Family history of early CAD 06/04/2016   Chest pain 06/04/2016   Abnormal nuclear stress test 06/04/2016   Degenerative joint disease (DJD) of lumbar spine 03/24/2016   Depression 03/24/2016   Obesity 08/14/2015   Essential hypertension 04/29/2015   Hyperlipidemia 04/29/2015   Past  Medical History:  Diagnosis Date   Anxiety    Back pain    Cataract    Coronary artery disease    CABGx3; Seen by Dr. Delford   Degenerative joint disease (DJD) of lumbar spine 03/24/2016   Depression    GERD (gastroesophageal reflux disease)    if overweight   Hyperlipidemia    Hypertension     Family History  Problem Relation Age of Onset   Hypertension Mother    Stroke Father    Heart disease Father    Dementia Father    Hypertension Sister    CAD Sister 82       cabg   Colon cancer Sister    Rectal cancer Sister    Hypertension Sister    Cancer Sister 39       rectal   Cancer - Cervical Sister        CYST ON CERVIX  APRIL 2024   Cancer - Other Sister         lymph node stage 3 cancer sept 2024   Hypertension Sister    Stomach cancer Neg Hx    Colon polyps Neg Hx    Esophageal cancer Neg Hx    Pancreatic cancer Neg Hx     Past Surgical History:  Procedure Laterality Date   BACK SURGERY     CARDIAC CATHETERIZATION N/A 06/05/2016   Procedure: Left Heart Cath and Coronary Angiography;  Surgeon: Victory LELON Sharps, MD;  Location: [facility] INVASIVE CV LAB;  Service: Cardiovascular;  Laterality: N/A;   COLONOSCOPY     CORONARY ARTERY BYPASS GRAFT N/A 06/17/2016   Procedure: CORONARY ARTERY BYPASS GRAFTING (CABG), ON PUMP, TIMES THREE, USING LEFT INTERNAL MAMMARY ARTERY, RIGHT GREATER SAPHENOUS VEIN HARVESTED ENDOSCOPICALLY;  Surgeon: Maude Fleeta Ochoa, MD;  Location: [facility] OR;  Service: Open Heart Surgery;  Laterality: N/A;   MULTIPLE TOOTH EXTRACTIONS     SPINE SURGERY  1990   lamnectomy L5S1   TEE WITHOUT CARDIOVERSION N/A 06/17/2016   Procedure: TRANSESOPHAGEAL ECHOCARDIOGRAM (TEE);  Surgeon: Maude Fleeta Ochoa, MD;  Location: [facility] OR;  Service: Open Heart Surgery;  Laterality: N/A;   Social History   Occupational History   Occupation: disabled    Comment: warehouse  Tobacco Use   Smoking status: Former    Current packs/day: 0.00    Types: Cigarettes    Quit date: 12/04/2006    Years since quitting: 17.6   Smokeless tobacco: Never  Vaping Use   Vaping status: Never Used  Substance and Sexual Activity   Alcohol use: Yes    Comment: occasional   Drug use: Yes    Types: Marijuana    Comment: daily   Sexual activity: Not Currently        "

## 2024-07-25 ENCOUNTER — Encounter: Payer: Self-pay | Admitting: Family Medicine

## 2024-07-26 ENCOUNTER — Encounter: Admitting: Physical Therapy

## 2024-08-02 ENCOUNTER — Encounter: Payer: Self-pay | Admitting: Orthopaedic Surgery

## 2024-08-04 ENCOUNTER — Ambulatory Visit: Admitting: Orthopaedic Surgery

## 2024-08-05 ENCOUNTER — Inpatient Hospital Stay: Admission: RE | Admit: 2024-08-05

## 2024-08-05 DIAGNOSIS — M7501 Adhesive capsulitis of right shoulder: Secondary | ICD-10-CM

## 2024-08-09 ENCOUNTER — Ambulatory Visit: Admitting: Orthopaedic Surgery

## 2024-10-17 ENCOUNTER — Encounter: Admitting: Family Medicine

## 2024-11-04 ENCOUNTER — Ambulatory Visit: Admitting: Cardiovascular Disease
